# Patient Record
Sex: Female | Born: 1940 | Race: White | Hispanic: No | Smoking: Former smoker
Health system: Southern US, Community
[De-identification: ages and names within clinical notes are randomized; demographics above are authoritative.]

## PROBLEM LIST (undated history)

## (undated) DIAGNOSIS — E785 Hyperlipidemia, unspecified: Secondary | ICD-10-CM

## (undated) DIAGNOSIS — E039 Hypothyroidism, unspecified: Secondary | ICD-10-CM

## (undated) DIAGNOSIS — I10 Essential (primary) hypertension: Secondary | ICD-10-CM

## (undated) HISTORY — DX: Essential (primary) hypertension: I10

## (undated) HISTORY — DX: Hyperlipidemia, unspecified: E78.5

## (undated) HISTORY — PX: CARDIAC CATHETERIZATION: SHX172

## (undated) HISTORY — PX: TOTAL HIP ARTHROPLASTY: SHX124

## (undated) HISTORY — PX: ABDOMINAL HYSTERECTOMY: SHX81

## (undated) HISTORY — PX: TONSILLECTOMY: SUR1361

## (undated) HISTORY — PX: EYE SURGERY: SHX253

## (undated) HISTORY — DX: Hypothyroidism, unspecified: E03.9

## (undated) HISTORY — PX: PARTIAL HYSTERECTOMY: SHX80

---

## 2004-08-16 ENCOUNTER — Emergency Department (HOSPITAL_COMMUNITY): Admission: EM | Admit: 2004-08-16 | Discharge: 2004-08-16 | Payer: Self-pay | Admitting: Emergency Medicine

## 2007-02-10 ENCOUNTER — Ambulatory Visit: Payer: Self-pay | Admitting: Internal Medicine

## 2008-03-25 ENCOUNTER — Ambulatory Visit: Payer: Self-pay | Admitting: Internal Medicine

## 2009-03-28 ENCOUNTER — Ambulatory Visit: Payer: Self-pay | Admitting: Internal Medicine

## 2010-03-27 ENCOUNTER — Encounter: Payer: Self-pay | Admitting: Cardiovascular Disease

## 2010-05-09 ENCOUNTER — Encounter: Payer: Self-pay | Admitting: Cardiovascular Disease

## 2010-05-09 ENCOUNTER — Ambulatory Visit (INDEPENDENT_AMBULATORY_CARE_PROVIDER_SITE_OTHER): Payer: Self-pay | Admitting: Cardiovascular Disease

## 2010-05-09 VITALS — BP 134/74 | HR 80 | Ht 61.0 in | Wt 238.8 lb

## 2010-05-09 DIAGNOSIS — E669 Obesity, unspecified: Secondary | ICD-10-CM | POA: Insufficient documentation

## 2010-05-09 DIAGNOSIS — R0602 Shortness of breath: Secondary | ICD-10-CM | POA: Insufficient documentation

## 2010-05-09 DIAGNOSIS — E785 Hyperlipidemia, unspecified: Secondary | ICD-10-CM

## 2010-05-09 DIAGNOSIS — I10 Essential (primary) hypertension: Secondary | ICD-10-CM | POA: Insufficient documentation

## 2010-05-09 DIAGNOSIS — I2510 Atherosclerotic heart disease of native coronary artery without angina pectoris: Secondary | ICD-10-CM | POA: Insufficient documentation

## 2010-05-09 NOTE — Patient Instructions (Addendum)
You are doing well.  Please monitor your heart rate and call us if it is elevated.  No medication changes were made. Please call us if you have new issues that need to be addressed before your next appt.  Please increase your water aerobics. Watch your diet, we have given you [facility] Lipid Low Glycemic Diet to follow. We will call you for a follow up Appt.

## 2010-05-09 NOTE — Assessment & Plan Note (Signed)
Blood pressure is well controlled on today's visit. We did discuss low-dose metoprolol as she would like to try exercise first. If her heart rate is elevated, and we need low-dose metoprolol, we could decrease her losartan HCTZ in half.

## 2010-05-09 NOTE — Assessment & Plan Note (Signed)
We spent a significant portion of the visit talking about weight loss techniques including various dietary changes and increasing her exercise to more than twice a week.

## 2010-05-09 NOTE — Assessment & Plan Note (Signed)
Shortness of breath is likely secondary to underlying deconditioning and obesity. We have asked her to increase her exercise

## 2010-05-09 NOTE — Assessment & Plan Note (Addendum)
She reports a remote history of mild coronary disease by cardiac catheterization numerous years ago. She denies any significant symptoms of chest discomfort or anginal equivalent at this time. She is not particularly interested in any additional workup at this time as she does feel well. We have encouraged her to continue to lose weight as this will also help improve her cholesterol.  Her heart rate was in the 80s. She reports that it is typically elevated at baseline. I asked her to monitor her heart rate with mild exertion at home as she does have a blood pressure cuff. If she typically runs fast, she may benefit from a low-dose beta blocker in an effort to improve any underlying tachycardia and her shortness of breath.

## 2010-05-09 NOTE — Progress Notes (Signed)
   Patient ID: Meagan Cox, female    DOB: [birth year], 70 y.o.   MRN: [id number]  HPI Comments: 70 year old woman with obesity, hypertension, hyperlipidemia, remote history of coronary artery disease by catheterization many years ago though the report is not available to [place] at this time, hypothyroidism who presents referral from Dr. Darrick Huntsman for evaluation for her history of coronary disease.  She reports that she goes to water aerobics twice a week. She works out for at least one hour. Occasionally she has shortness of breath though typically she is able to finish the class. She does have shortness of breath with walking. She does not do any significant walking or other aerobic activities. She has been trying to lose weight and is considering a surgical procedure. She denies any problems with her blood pressure. She does have very rare episodes of chest discomfort that is in a small region on the left breast it comes on at rest. No significant diaphoresis, near syncope. Otherwise she has no complaints. She does not believe that she has worsening coronary disease at this time as she feels well.  EKG shows normal sinus rhythm with rate 80 beats per minute with left axis deviation, nonspecific ST changes in the anterolateral leads     Review of Systems  Constitutional: Positive for fatigue.  HENT: Negative.   Eyes: Negative.   Respiratory: Positive for shortness of breath.   Cardiovascular: Negative.   Gastrointestinal: Negative.   Musculoskeletal: Negative.   Skin: Negative.   Neurological: Negative.   Hematological: Negative.   Psychiatric/Behavioral: Negative.   All other systems reviewed and are negative.    BP 134/74  Pulse 80  Ht 5\' 1"  (1.549 m)  Wt 238 lb 12.8 oz (108.319 kg)  BMI 45.12 kg/m2   Physical Exam  Nursing note and vitals reviewed. Constitutional: She is oriented to person, place, and time. She appears well-developed and well-nourished.       obese  HENT:  Head:  Normocephalic.  Nose: Nose normal.  Mouth/Throat: Oropharynx is clear and moist.  Eyes: Conjunctivae are normal. Pupils are equal, round, and reactive to light.  Neck: Normal range of motion. Neck supple. No JVD present.  Cardiovascular: Normal rate, regular rhythm, normal heart sounds and intact distal pulses.  Exam reveals no gallop and no friction rub.   No murmur heard. Pulmonary/Chest: Effort normal and breath sounds normal. No respiratory distress. She has no wheezes. She has no rales. She exhibits no tenderness.  Abdominal: Soft. Bowel sounds are normal. She exhibits no distension. There is no tenderness.  Musculoskeletal: Normal range of motion. She exhibits no edema and no tenderness.  Lymphadenopathy:    She has no cervical adenopathy.  Neurological: She is alert and oriented to person, place, and time. Coordination normal.  Skin: Skin is warm and dry. No rash noted. No erythema.  Psychiatric: She has a normal mood and affect. Her behavior is normal. Judgment and thought content normal.         Assessment and Plan

## 2010-05-09 NOTE — Assessment & Plan Note (Signed)
Ideally, if she does have mild coronary artery disease, we would like an LDL less than 70. She does not want to change any of her medications at this time. We have recommended weight loss and increased exercise as a way to decrease her cholesterol.

## 2010-12-18 ENCOUNTER — Ambulatory Visit (INDEPENDENT_AMBULATORY_CARE_PROVIDER_SITE_OTHER): Payer: Medicare Other | Admitting: Internal Medicine

## 2010-12-18 ENCOUNTER — Encounter: Payer: Self-pay | Admitting: Internal Medicine

## 2010-12-18 ENCOUNTER — Ambulatory Visit (INDEPENDENT_AMBULATORY_CARE_PROVIDER_SITE_OTHER)
Admission: RE | Admit: 2010-12-18 | Discharge: 2010-12-18 | Disposition: A | Discharge status: Home / Self Care | Payer: Medicare Other | Source: Ambulatory Visit | Attending: Internal Medicine | Admitting: Internal Medicine

## 2010-12-18 VITALS — BP 136/80 | HR 74 | Temp 98.4°F | Resp 16 | Ht 64.5 in | Wt 237.8 lb

## 2010-12-18 DIAGNOSIS — F419 Anxiety disorder, unspecified: Secondary | ICD-10-CM

## 2010-12-18 DIAGNOSIS — E669 Obesity, unspecified: Secondary | ICD-10-CM

## 2010-12-18 DIAGNOSIS — M549 Dorsalgia, unspecified: Secondary | ICD-10-CM

## 2010-12-18 DIAGNOSIS — M25551 Pain in right hip: Secondary | ICD-10-CM

## 2010-12-18 DIAGNOSIS — M545 Low back pain: Secondary | ICD-10-CM

## 2010-12-18 DIAGNOSIS — G8929 Other chronic pain: Secondary | ICD-10-CM | POA: Insufficient documentation

## 2010-12-18 DIAGNOSIS — F411 Generalized anxiety disorder: Secondary | ICD-10-CM

## 2010-12-18 DIAGNOSIS — M25559 Pain in unspecified hip: Secondary | ICD-10-CM

## 2010-12-18 DIAGNOSIS — E039 Hypothyroidism, unspecified: Secondary | ICD-10-CM | POA: Insufficient documentation

## 2010-12-18 MED ORDER — ALPRAZOLAM 0.5 MG PO TABS: 0.5000 mg | ORAL_TABLET | Freq: Every evening | ORAL | Status: DC | PRN

## 2010-12-18 MED ORDER — OXYCODONE HCL 5 MG PO TABS: 5.0000 mg | ORAL_TABLET | Freq: Three times a day (TID) | ORAL | Status: DC | PRN

## 2010-12-18 MED ORDER — TRAMADOL HCL 50 MG PO TABS: 50.0000 mg | ORAL_TABLET | Freq: Four times a day (QID) | ORAL | Status: DC | PRN

## 2010-12-18 NOTE — Assessment & Plan Note (Signed)
contorlled with synthroid.  Repeat tsh due.

## 2010-12-18 NOTE — Assessment & Plan Note (Addendum)
Efforts to lose weight included trial of phentermine and celexa Renato Shin weight loss method) in March 2012 with 7 lb loss in one month .  However her interest waned  And her ability to exercise to sustain the weight loss has been hampered by persistent low back pain limiting her activity.

## 2010-12-18 NOTE — Progress Notes (Signed)
  Subjective:    Patient ID: Meagan Cox, female    DOB: May 31, 1940, 70 y.o.   MRN: [id number]  HPI  70 yo white female with chronic low back pain , treated for facet syndrome by chiropractor Dr. Alfredo Bach, for persistent low back pain .  She is having persistent pain n the L5-S1 pain radiates to adductor and anterior thigh bilaterally with progressive weakness of the right hip flexors.  3 months duration. Has been taking over 4 gm of tylenol daily for the last month .    Review of Systems  Constitutional: Negative for fever, chills and unexpected weight change.  HENT: Negative for hearing loss, ear pain, nosebleeds, congestion, sore throat, facial swelling, rhinorrhea, sneezing, mouth sores, trouble swallowing, neck pain, neck stiffness, voice change, postnasal drip, sinus pressure, tinnitus and ear discharge.   Eyes: Negative for pain, discharge, redness and visual disturbance.  Respiratory: Negative for cough, chest tightness, shortness of breath, wheezing and stridor.   Cardiovascular: Negative for chest pain, palpitations and leg swelling.  Musculoskeletal: Positive for back pain. Negative for myalgias and arthralgias.  Skin: Negative for color change and rash.  Neurological: Negative for dizziness, weakness, light-headedness and headaches.  Hematological: Negative for adenopathy.       Objective:   Physical Exam  Constitutional: She is oriented to person, place, and time. She appears well-developed and well-nourished.  HENT:  Mouth/Throat: Oropharynx is clear and moist.  Eyes: EOM are normal. Pupils are equal, round, and reactive to light. No scleral icterus.  Neck: Normal range of motion. Neck supple. No JVD present. No thyromegaly present.  Cardiovascular: Normal rate, regular rhythm, normal heart sounds and intact distal pulses.   Pulmonary/Chest: Effort normal and breath sounds normal.  Abdominal: Soft. Bowel sounds are normal. She exhibits no mass. There is no tenderness.    Musculoskeletal: She exhibits no edema.       Right hip: She exhibits decreased range of motion.  Lymphadenopathy:    She has no cervical adenopathy.  Neurological: She is alert and oriented to person, place, and time.  Skin: Skin is warm and dry.  Psychiatric: She has a normal mood and affect.          Assessment & Plan:

## 2010-12-18 NOTE — Assessment & Plan Note (Signed)
With persistent bilateral thigh pain,  Plain films ordered,  wi meed to rule out spinal stenosis

## 2010-12-18 NOTE — Assessment & Plan Note (Signed)
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 Plain films ordered.

## 2010-12-18 NOTE — Patient Instructions (Signed)
We are ordering x rays of your right hip and lumbar spine to start with  Please stop the tylenol and use the tramadol and roxicodone for pain control.  The roxicodone is a narcotic and my cause constipation or sleepiness.

## 2010-12-19 LAB — COMPREHENSIVE METABOLIC PANEL
AST: 19 U/L (ref 0–37)
Albumin: 3.6 g/dL (ref 3.5–5.2)
Alkaline Phosphatase: 66 U/L (ref 39–117)
Calcium: 9 mg/dL (ref 8.4–10.5)
Chloride: 104 mEq/L (ref 96–112)
Glucose, Bld: 110 mg/dL — ABNORMAL HIGH (ref 70–99)
Potassium: 3.9 mEq/L (ref 3.5–5.1)
Sodium: 141 mEq/L (ref 135–145)
Total Protein: 6.8 g/dL (ref 6.0–8.3)

## 2010-12-22 ENCOUNTER — Encounter: Payer: Self-pay | Admitting: Internal Medicine

## 2011-02-09 ENCOUNTER — Other Ambulatory Visit: Payer: Self-pay | Admitting: Internal Medicine

## 2011-02-09 ENCOUNTER — Ambulatory Visit (INDEPENDENT_AMBULATORY_CARE_PROVIDER_SITE_OTHER): Payer: Medicare Other | Admitting: *Deleted

## 2011-02-09 DIAGNOSIS — Z23 Encounter for immunization: Secondary | ICD-10-CM

## 2011-02-09 MED ORDER — ZOSTER VACCINE LIVE 19400 UNT/0.65ML ~~LOC~~ SOLR: 0.6500 mL | Freq: Once | SUBCUTANEOUS | Status: AC

## 2011-04-06 ENCOUNTER — Ambulatory Visit (INDEPENDENT_AMBULATORY_CARE_PROVIDER_SITE_OTHER): Payer: Medicare Other | Admitting: Internal Medicine

## 2011-04-06 ENCOUNTER — Encounter: Payer: Self-pay | Admitting: Internal Medicine

## 2011-04-06 VITALS — BP 132/88 | HR 80 | Temp 97.6°F | Resp 16 | Ht 62.0 in | Wt 245.0 lb

## 2011-04-06 DIAGNOSIS — N63 Unspecified lump in unspecified breast: Secondary | ICD-10-CM

## 2011-04-06 DIAGNOSIS — N6310 Unspecified lump in the right breast, unspecified quadrant: Secondary | ICD-10-CM

## 2011-04-06 DIAGNOSIS — M130 Polyarthritis, unspecified: Secondary | ICD-10-CM

## 2011-04-06 DIAGNOSIS — E785 Hyperlipidemia, unspecified: Secondary | ICD-10-CM

## 2011-04-06 DIAGNOSIS — Z79899 Other long term (current) drug therapy: Secondary | ICD-10-CM

## 2011-04-06 MED ORDER — MELOXICAM 15 MG PO TABS: 15.0000 mg | ORAL_TABLET | Freq: Every day | ORAL | Status: DC

## 2011-04-06 MED ORDER — LOVASTATIN 40 MG PO TABS: 40.0000 mg | ORAL_TABLET | Freq: Every day | ORAL | Status: DC

## 2011-04-06 NOTE — Progress Notes (Signed)
Subjective:    Patient ID: Meagan Cox, female    DOB: 1941-01-29, 71 y.o.   MRN: [id number]  HPI  71 yr old white female with history of obesity, hypertension presents with right sided breast mass which she found two weeks ago while on a cruise.  She did suffer from a URI which she contracted from the passenger on the plane next to her who was sick. The mass is slightly tender.  Her sinus symptoms have resolved.  Her second issue is her persistent ankle athropathy, which has been under evaluation by her orthopedist who is suggesting that she have a workup for inflammatory arthritis due to changes seen on recent films.   Past Medical History  Diagnosis Date  . Hypertension   . Hyperlipidemia   . Hypothyroidism    Current Outpatient Prescriptions on File Prior to Visit  Medication Sig Dispense Refill  . ALPRAZolam (XANAX) 0.5 MG tablet Take 1 tablet (0.5 mg total) by mouth at bedtime as needed.  30 tablet  3  . aspirin 81 MG EC tablet Take 81 mg by mouth daily.        . calcium carbonate (CALCIUM 500) 1250 MG tablet Take 1 tablet by mouth daily.        Marland Kitchen estradiol (ESTRACE) 2 MG tablet Take 2 mg by mouth daily.        Marland Kitchen levothyroxine (SYNTHROID, LEVOTHROID) 125 MCG tablet Take 125 mcg by mouth daily.        Marland Kitchen losartan-hydrochlorothiazide (HYZAAR) 100-25 MG per tablet Take 1 tablet by mouth daily.        . Multiple Vitamin (MULTIVITAMIN) tablet Take 1 tablet by mouth daily.          Review of Systems  Constitutional: Negative for fever, chills and unexpected weight change.  HENT: Negative for hearing loss, ear pain, nosebleeds, congestion, sore throat, facial swelling, rhinorrhea, sneezing, mouth sores, trouble swallowing, neck pain, neck stiffness, voice change, postnasal drip, sinus pressure, tinnitus and ear discharge.   Eyes: Negative for pain, discharge, redness and visual disturbance.  Respiratory: Negative for cough, chest tightness, shortness of breath, wheezing and stridor.     Cardiovascular: Negative for chest pain, palpitations and leg swelling.  Musculoskeletal: Negative for myalgias and arthralgias.  Skin: Negative for color change and rash.  Neurological: Negative for dizziness, weakness, light-headedness and headaches.  Hematological: Negative for adenopathy.       Objective:   Physical Exam  Constitutional: She is oriented to person, place, and time. She appears well-developed and well-nourished.  HENT:  Mouth/Throat: Oropharynx is clear and moist.  Eyes: EOM are normal. Pupils are equal, round, and reactive to light. No scleral icterus.  Neck: Normal range of motion. Neck supple. No JVD present. No thyromegaly present.  Cardiovascular: Normal rate, regular rhythm, normal heart sounds and intact distal pulses.   Pulmonary/Chest: Effort normal and breath sounds normal. Right breast exhibits mass and tenderness.    Abdominal: Soft. Bowel sounds are normal. She exhibits no mass. There is no tenderness.  Musculoskeletal: Normal range of motion. She exhibits no edema.  Lymphadenopathy:    She has no cervical adenopathy.  Neurological: She is alert and oriented to person, place, and time.  Skin: Skin is warm and dry.  Psychiatric: She has a normal mood and affect.     Assessment & Plan:  Breast mass: diagnostic mammogram and ultrasound ordered. Hyperlipidemia:  On statin therapy.  Due for repeat labs.   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    Updated Medication List Outpatient Encounter Prescriptions as of 04/06/2011  Medication Sig Dispense Refill  . ALPRAZolam (XANAX) 0.5 MG tablet Take 1 tablet (0.5 mg total) by mouth at bedtime as needed.  30 tablet  3  . aspirin 81 MG EC tablet Take 81 mg by mouth daily.        . calcium carbonate (CALCIUM 500) 1250 MG tablet Take 1 tablet by mouth daily.        Marland Kitchen estradiol (ESTRACE) 2 MG tablet Take 2 mg by mouth daily.        Marland Kitchen levothyroxine (SYNTHROID, LEVOTHROID) 125  MCG tablet Take 125 mcg by mouth daily.        Marland Kitchen losartan-hydrochlorothiazide (HYZAAR) 100-25 MG per tablet Take 1 tablet by mouth daily.        Marland Kitchen lovastatin (MEVACOR) 40 MG tablet Take 1 tablet (40 mg total) by mouth at bedtime.  90 tablet  3  . Multiple Vitamin (MULTIVITAMIN) tablet Take 1 tablet by mouth daily.        Marland Kitchen DISCONTD: lovastatin (MEVACOR) 40 MG tablet Take 40 mg by mouth at bedtime.        . meloxicam (MOBIC) 15 MG tablet Take 1 tablet (15 mg total) by mouth daily.  30 tablet  3  . DISCONTD: oxyCODONE (ROXICODONE) 5 MG immediate release tablet Take 1 tablet (5 mg total) by mouth every 8 (eight) hours as needed for pain.  60 tablet  0  . DISCONTD: traMADol (ULTRAM) 50 MG tablet Take 1 tablet (50 mg total) by mouth every 6 (six) hours as needed for pain. Maximum dose= 8 tablets per day  120 tablet  4

## 2011-04-08 DIAGNOSIS — M130 Polyarthritis, unspecified: Secondary | ICD-10-CM | POA: Insufficient documentation

## 2011-04-08 NOTE — Assessment & Plan Note (Signed)
checking serologies for inflammatory arthritis.

## 2011-04-13 ENCOUNTER — Telehealth: Payer: Self-pay | Admitting: Internal Medicine

## 2011-04-13 NOTE — Telephone Encounter (Signed)
The breast imagines were normal.  No masses.

## 2011-04-13 NOTE — Telephone Encounter (Signed)
Left message asking patient to return my call.

## 2011-04-18 NOTE — Telephone Encounter (Signed)
Patient notified of results.

## 2011-04-20 ENCOUNTER — Encounter: Payer: Self-pay | Admitting: Internal Medicine

## 2011-05-08 ENCOUNTER — Ambulatory Visit (INDEPENDENT_AMBULATORY_CARE_PROVIDER_SITE_OTHER): Payer: Medicare Other | Admitting: Internal Medicine

## 2011-05-08 ENCOUNTER — Encounter: Payer: Self-pay | Admitting: Internal Medicine

## 2011-05-08 VITALS — BP 116/78 | HR 68 | Temp 97.9°F | Resp 16 | Wt 248.2 lb

## 2011-05-08 DIAGNOSIS — M130 Polyarthritis, unspecified: Secondary | ICD-10-CM

## 2011-05-08 DIAGNOSIS — Z79899 Other long term (current) drug therapy: Secondary | ICD-10-CM

## 2011-05-08 DIAGNOSIS — I10 Essential (primary) hypertension: Secondary | ICD-10-CM

## 2011-05-08 DIAGNOSIS — E669 Obesity, unspecified: Secondary | ICD-10-CM

## 2011-05-08 DIAGNOSIS — E785 Hyperlipidemia, unspecified: Secondary | ICD-10-CM

## 2011-05-08 NOTE — Assessment & Plan Note (Addendum)
I spent 25 minutes addressing BMI and recommended a low glycemic index diet utilizing smaller more frequent meals to increase metabolism.  I have also recommended that patient start exercising with a goal of 30 minutes of aerobic exercise a minimum of 5 days per week.

## 2011-05-08 NOTE — Patient Instructions (Signed)
Consider the Low Glycemic Index Diet and 6 smaller meals daily :   7 AM Low carbohydrate Protein  Shakes (EAS Carb Control  Or Atkins ,  Available everywhere,   In  cases at [employer] )  2.5 carbs  (Add or substitute a toasted sandwhich thin w/ peanut butter)  10 AM: Protein bar by Atkins (snack size,  Chocolate lover's variety at  [employer])    Lunch: sandwich on pita bread or flatbread (Joseph's makes a pita bread and a flat bread , available at [employer] and [employer]; Toufayah makes a low carb flatbread available at [employer] and [employer])   [employer] make low carb tortillas   3 PM:  Mid day :  Another protein bar,  Or a  cheese stick, 1/4 cup of almonds, walnuts, pistachios, pecans, peanuts,  Macadamia nuts  6 PM  Dinner:  "mean and green:"  Meat/chicken/fish, salad, and green veggie : use ranch, vinagrette,  Blue cheese, etc  9 PM snack : Breyer's low carb fudgiscle or  ice cream bar (Carb Smart) Weight Watcher's ice cream bar , or another protein shake

## 2011-05-08 NOTE — Assessment & Plan Note (Signed)
Well controlled on current medications.  No changes today. 

## 2011-05-08 NOTE — Progress Notes (Signed)
Patient ID: Meagan Cox, female   DOB: [birth year], 71 y.o.   MRN: [id number]  Patient Active Problem List  Diagnoses  . Obesity  . SOB (shortness of breath)  . CAD (coronary artery disease)  . Hyperlipidemia  . HTN (hypertension)  . Hypothyroidism  . Low back pain radiating to both legs  . Hip pain, right  . Polyarthritis of ankle    Subjective:  CC:   Chief Complaint  Patient presents with  . Follow-up    HPI:   Meagan Tinkle Cooperis a 71 y.o. female who presents for follow up on obesity .  She has continued to gain weight because she she cannot adhere to a low carbohydrate or low calorie diet.  She exercising twice weekly in a water aerobics class.  She often skips meals because she forgets to eat.   Past Medical History  Diagnosis Date  . Hypertension   . Hyperlipidemia   . Hypothyroidism     Past Surgical History  Procedure Date  . Cardiac catheterization     30 years ago, [place]  . Tonsillectomy   . Partial hysterectomy   . Abdominal hysterectomy          The following portions of the patient's history were reviewed and updated as appropriate: Allergies, current medications, and problem list.    Review of Systems:   12 Pt  review of systems was negative except those addressed in the HPI,     History   Social History  . Marital Status: Divorced    Spouse Name: N/A    Number of Children: N/A  . Years of Education: N/A   Occupational History  . Not on file.   Social History Main Topics  . Smoking status: Former Smoker -- 20 years    Types: Cigarettes    Quit date: 01/30/1988  . Smokeless tobacco: Never Used  . Alcohol Use: Yes     social/occasional  . Drug Use: No  . Sexually Active: Not on file   Other Topics Concern  . Not on file   Social History Narrative  . No narrative on file    Objective:  BP 116/78  Pulse 68  Temp(Src) 97.9 F (36.6 C) (Oral)  Resp 16  Wt 248 lb 4 oz (112.605 kg)  SpO2 97%  General  appearance: alert, cooperative and appears stated age Ears: normal TM's and external ear canals both ears Throat: lips, mucosa, and tongue normal; teeth and gums normal Neck: no adenopathy, no carotid bruit, supple, symmetrical, trachea midline and thyroid not enlarged, symmetric, no tenderness/mass/nodules Back: symmetric, no curvature. ROM normal. No CVA tenderness. Lungs: clear to auscultation bilaterally Heart: regular rate and rhythm, S1, S2 normal, no murmur, click, rub or gallop Abdomen: soft, non-tender; bowel sounds normal; no masses,  no organomegaly Pulses: 2+ and symmetric Skin: Skin color, texture, turgor normal. No rashes or lesions Lymph nodes: Cervical, supraclavicular, and axillary nodes normal.  Assessment and Plan:  Obesity I spent 25 minutes addressing BMI and recommended a low glycemic index diet utilizing smaller more frequent meals to increase metabolism.  I have also recommended that patient start exercising with a goal of 30 minutes of aerobic exercise a minimum of 5 days per week.   HTN (hypertension) Well controlled on current medications.  No changes today.    Updated Medication List Outpatient Encounter Prescriptions as of 05/08/2011  Medication Sig Dispense Refill  . ALPRAZolam (XANAX) 0.5 MG tablet Take 1 tablet (0.5 mg total)  by mouth at bedtime as needed.  30 tablet  3  . aspirin 81 MG EC tablet Take 81 mg by mouth daily.        . calcium carbonate (CALCIUM 500) 1250 MG tablet Take 1 tablet by mouth daily.        Marland Kitchen estradiol (ESTRACE) 2 MG tablet Take 2 mg by mouth daily.        Marland Kitchen levothyroxine (SYNTHROID, LEVOTHROID) 125 MCG tablet Take 125 mcg by mouth daily.        Marland Kitchen losartan-hydrochlorothiazide (HYZAAR) 100-25 MG per tablet Take 1 tablet by mouth daily.        Marland Kitchen lovastatin (MEVACOR) 40 MG tablet Take 1 tablet (40 mg total) by mouth at bedtime.  90 tablet  3  . meloxicam (MOBIC) 15 MG tablet Take 1 tablet (15 mg total) by mouth daily.  30 tablet  3    . Multiple Vitamin (MULTIVITAMIN) tablet Take 1 tablet by mouth daily.           Orders Placed This Encounter  Procedures  . Sedimentation rate  . C-reactive protein  . Uric acid  . ANA  . Rheumatoid factor  . Lipid panel  . COMPLETE METABOLIC PANEL WITH GFR    No Follow-up on file.

## 2011-05-09 LAB — C-REACTIVE PROTEIN: CRP: 0.71 mg/dL — ABNORMAL HIGH (ref <0.60)

## 2011-05-09 LAB — LIPID PANEL
Total CHOL/HDL Ratio: 2.5 Ratio
VLDL: 42 mg/dL — ABNORMAL HIGH (ref 0–40)

## 2011-05-09 LAB — COMPLETE METABOLIC PANEL WITH GFR
AST: 18 U/L (ref 0–37)
Albumin: 3.8 g/dL (ref 3.5–5.2)
Alkaline Phosphatase: 63 U/L (ref 39–117)
Potassium: 4.4 mEq/L (ref 3.5–5.3)
Sodium: 138 mEq/L (ref 135–145)
Total Protein: 6.2 g/dL (ref 6.0–8.3)

## 2011-05-09 LAB — SEDIMENTATION RATE: Sed Rate: 4 mm/hr (ref 0–22)

## 2011-05-09 LAB — URIC ACID: Uric Acid, Serum: 5 mg/dL (ref 2.4–7.0)

## 2011-05-09 LAB — RHEUMATOID FACTOR: Rhuematoid fact SerPl-aCnc: <10 IU/mL (ref <=14)

## 2011-05-10 ENCOUNTER — Other Ambulatory Visit: Payer: Self-pay | Admitting: Internal Medicine

## 2011-05-10 MED ORDER — LOSARTAN POTASSIUM-HCTZ 100-25 MG PO TABS: 1.0000 | ORAL_TABLET | Freq: Every day | ORAL | Status: DC

## 2011-07-09 ENCOUNTER — Other Ambulatory Visit: Payer: Self-pay | Admitting: Internal Medicine

## 2011-08-21 ENCOUNTER — Other Ambulatory Visit: Payer: Self-pay | Admitting: Internal Medicine

## 2011-12-25 ENCOUNTER — Other Ambulatory Visit: Payer: Self-pay

## 2011-12-25 MED ORDER — LEVOTHYROXINE SODIUM 125 MCG PO TABS: 125.0000 ug | ORAL_TABLET | Freq: Every day | ORAL | Status: DC

## 2011-12-25 NOTE — Telephone Encounter (Signed)
Refill request for Levothyroxine 125 mcg sent electronic to [facility].

## 2012-01-02 ENCOUNTER — Other Ambulatory Visit: Payer: Self-pay

## 2012-01-02 MED ORDER — LEVOTHYROXINE SODIUM 125 MCG PO TABS: 125.0000 ug | ORAL_TABLET | Freq: Every day | ORAL | Status: DC

## 2012-01-02 NOTE — Telephone Encounter (Signed)
Patient requested 90 day supply of Levothyroxine change was made in epic.

## 2012-01-04 ENCOUNTER — Encounter: Payer: Self-pay | Admitting: Internal Medicine

## 2012-01-04 ENCOUNTER — Ambulatory Visit (INDEPENDENT_AMBULATORY_CARE_PROVIDER_SITE_OTHER): Payer: Medicare Other | Admitting: Internal Medicine

## 2012-01-04 VITALS — BP 130/78 | HR 66 | Temp 98.3°F | Resp 12 | Ht 62.0 in | Wt 231.5 lb

## 2012-01-04 DIAGNOSIS — E039 Hypothyroidism, unspecified: Secondary | ICD-10-CM

## 2012-01-04 DIAGNOSIS — J069 Acute upper respiratory infection, unspecified: Secondary | ICD-10-CM

## 2012-01-04 DIAGNOSIS — I10 Essential (primary) hypertension: Secondary | ICD-10-CM

## 2012-01-04 DIAGNOSIS — Z79899 Other long term (current) drug therapy: Secondary | ICD-10-CM

## 2012-01-04 DIAGNOSIS — F419 Anxiety disorder, unspecified: Secondary | ICD-10-CM

## 2012-01-04 DIAGNOSIS — Z1331 Encounter for screening for depression: Secondary | ICD-10-CM

## 2012-01-04 DIAGNOSIS — E669 Obesity, unspecified: Secondary | ICD-10-CM

## 2012-01-04 DIAGNOSIS — F411 Generalized anxiety disorder: Secondary | ICD-10-CM

## 2012-01-04 DIAGNOSIS — E785 Hyperlipidemia, unspecified: Secondary | ICD-10-CM

## 2012-01-04 DIAGNOSIS — F418 Other specified anxiety disorders: Secondary | ICD-10-CM

## 2012-01-04 DIAGNOSIS — F341 Dysthymic disorder: Secondary | ICD-10-CM

## 2012-01-04 LAB — COMPREHENSIVE METABOLIC PANEL
AST: 18 U/L (ref 0–37)
Alkaline Phosphatase: 62 U/L (ref 39–117)
BUN: 21 mg/dL (ref 6–23)
Calcium: 9.1 mg/dL (ref 8.4–10.5)
Creatinine, Ser: 0.7 mg/dL (ref 0.4–1.2)

## 2012-01-04 LAB — TSH: TSH: 1.44 u[IU]/mL (ref 0.35–5.50)

## 2012-01-04 MED ORDER — BENZONATATE 200 MG PO CAPS: 200.0000 mg | ORAL_CAPSULE | Freq: Two times a day (BID) | ORAL | Status: DC | PRN

## 2012-01-04 MED ORDER — GUAIFENESIN-CODEINE 100-10 MG/5ML PO SYRP: 5.0000 mL | ORAL_SOLUTION | Freq: Three times a day (TID) | ORAL | Status: DC | PRN

## 2012-01-04 MED ORDER — AZITHROMYCIN 500 MG PO TABS: 500.0000 mg | ORAL_TABLET | Freq: Every day | ORAL | Status: DC

## 2012-01-04 MED ORDER — ALPRAZOLAM 0.5 MG PO TABS: 0.5000 mg | ORAL_TABLET | Freq: Every evening | ORAL | Status: DC | PRN

## 2012-01-04 NOTE — Progress Notes (Signed)
Patient ID: Meagan Cox, female   DOB: Apr 09, 1940, 71 y.o.   MRN: [id number]  Patient Active Problem List  Diagnosis  . Obesity  . SOB (shortness of breath)  . CAD (coronary artery disease)  . Hyperlipidemia  . HTN (hypertension)  . Hypothyroidism  . Low back pain radiating to both legs  . Hip pain, right  . Polyarthritis of ankle  . Generalized anxiety disorder  . URI (upper respiratory infection)    Subjective:  CC:   Chief Complaint  Patient presents with  . Follow-up    HPI:   Meagan Cox is a 71 y.o. female who presents for follow up on chronic conditions including hypertension and obesity.  She has lost over 17 lbs since her last visit but denies any concerted effort to do so.  She recently returned from [place],  [place] cruise, to [place] with 4 port of calls .  Getting ready to go to [place] to spend the holidays with her son in law, followed by a trip to [place] for her birthday and a wedding.  Her cc is persistent cough,  headache and sinus congestion.  She has had a lot of clear drainage.  2) Depression with anxiety.  She is managing her symptoms with prn alprazolam but ran out.       Past Medical History  Diagnosis Date  . Hypertension   . Hyperlipidemia   . Hypothyroidism     Past Surgical History  Procedure Date  . Cardiac catheterization     30 years ago,   . Tonsillectomy   . Partial hysterectomy   . Abdominal hysterectomy          The following portions of the patient's history were reviewed and updated as appropriate: Allergies, current medications, and problem list.    Review of Systems:   Patient denies , fevers, skin rash, eye pain,  sore throat, dysphagia,  hemoptysis ,chest pain, palpitations, orthopnea, edema, abdominal pain, nausea, melena, diarrhea, constipation, flank pain, dysuria, hematuria, urinary  Frequency, nocturia, numbness, tingling, seizures,  Focal weakness, Loss of consciousness,  Tremor,  insomnia, depression, and suicidal ideation.       History   Social History  . Marital Status: Divorced    Spouse Name: N/A    Number of Children: N/A  . Years of Education: N/A   Occupational History  . Not on file.   Social History Main Topics  . Smoking status: Former Smoker -- 20 years    Types: Cigarettes    Quit date: 01/30/1988  . Smokeless tobacco: Never Used  . Alcohol Use: Yes     Comment: social/occasional  . Drug Use: No  . Sexually Active: Not on file   Other Topics Concern  . Not on file   Social History Narrative  . No narrative on file    Objective:  BP 130/78  Pulse 66  Temp 98.3 F (36.8 C) (Oral)  Resp 12  Ht 5\' 2"  (1.575 m)  Wt 231 lb 8 oz (105.008 kg)  BMI 42.34 kg/m2  SpO2 95%  General appearance: alert, cooperative and appears stated age Ears: normal TM's and external ear canals both ears Nose: bilateral maxillary facial pain.  Throat: lips, mucosa, and tongue normal; teeth and gums normal Neck: no adenopathy, no carotid bruit, supple, symmetrical, trachea midline and thyroid not enlarged, symmetric, no tenderness/mass/nodules Back: symmetric, no curvature. ROM normal. No CVA tenderness. Lungs: clear to auscultation bilaterally Heart: regular rate and rhythm, S1,  S2 normal, no murmur, click, rub or gallop Abdomen: soft, non-tender; bowel sounds normal; no masses,  no organomegaly Pulses: 2+ and symmetric Skin: Skin color, texture, turgor normal. No rashes or lesions Lymph nodes: Cervical, supraclavicular, and axillary nodes normal.  Assessment and Plan:  Generalized anxiety disorder She has no symptoms of depression by screen today, but uses alprazolam prn insomnia and anxiety and refers to it as her "happy pill.". Refill given.   HTN (hypertension) Well controlled on current regimen. Renal function stable, no changes today.  Hyperlipidemia Well controlled on current regimen. Normal LFTs as of today,  Her last  LDL was 62 in  April   Obesity improved BMI with weight loss of 17 lbs.  Continue current regimen, BMI is still 42.  Needs to lose 50 lbs over the next year.   URI (upper respiratory infection) Given chronicity of symptoms, development of facial pain and exam consistent with bacterial URI,  Will treat with empiric antibiotics, decongestants, and saline lavage.     Updated Medication List Outpatient Encounter Prescriptions as of 01/04/2012  Medication Sig Dispense Refill  . ALPRAZolam (XANAX) 0.5 MG tablet Take 1 tablet (0.5 mg total) by mouth at bedtime as needed.  30 tablet  3  . aspirin 81 MG EC tablet Take 81 mg by mouth daily.        . calcium carbonate (CALCIUM 500) 1250 MG tablet Take 1 tablet by mouth daily.        Marland Kitchen estradiol (ESTRACE) 2 MG tablet TAKE ONE TABLET BY MOUTH EVERY DAY  90 tablet  3  . levothyroxine (SYNTHROID, LEVOTHROID) 125 MCG tablet Take 1 tablet (125 mcg total) by mouth daily.  90 tablet  3  . losartan-hydrochlorothiazide (HYZAAR) 100-25 MG per tablet Take 1 tablet by mouth daily.  90 tablet  3  . lovastatin (MEVACOR) 40 MG tablet Take 1 tablet (40 mg total) by mouth at bedtime.  90 tablet  3  . meloxicam (MOBIC) 15 MG tablet TAKE ONE TABLET BY MOUTH EVERY DAY  30 tablet  5  . Multiple Vitamin (MULTIVITAMIN) tablet Take 1 tablet by mouth daily.        . [DISCONTINUED] ALPRAZolam (XANAX) 0.5 MG tablet Take 1 tablet (0.5 mg total) by mouth at bedtime as needed.  30 tablet  3  . azithromycin (ZITHROMAX) 500 MG tablet Take 1 tablet (500 mg total) by mouth daily.  7 tablet  0  . benzonatate (TESSALON) 200 MG capsule Take 1 capsule (200 mg total) by mouth 2 (two) times daily as needed for cough.  60 capsule  0  . guaiFENesin-codeine (CHERATUSSIN AC) 100-10 MG/5ML syrup Take 5 mLs by mouth 3 (three) times daily as needed for cough.  240 mL  0  . [DISCONTINUED] guaiFENesin-codeine (CHERATUSSIN AC) 100-10 MG/5ML syrup Take 5 mLs by mouth 3 (three) times daily as needed for cough.  180 mL   0     Orders Placed This Encounter  Procedures  . Comprehensive metabolic panel  . TSH    No Follow-up on file.

## 2012-01-04 NOTE — Patient Instructions (Addendum)
I am prescribing you azithromycin  7 day course   Use the old robitussin or Delsym OTC for daytime cough  You should use benadryl for the post nasal drip and  sudafed PE/afrin for nasal congestion or Afrin nasal spray twice daily for 5 days for the ear and sinus congestion  I also recommending using Simply Saline nasal spray twice daily to flush your sinuses out. (available OTC)

## 2012-01-06 ENCOUNTER — Encounter: Payer: Self-pay | Admitting: Internal Medicine

## 2012-01-06 DIAGNOSIS — F411 Generalized anxiety disorder: Secondary | ICD-10-CM | POA: Insufficient documentation

## 2012-01-06 DIAGNOSIS — J069 Acute upper respiratory infection, unspecified: Secondary | ICD-10-CM | POA: Insufficient documentation

## 2012-01-06 NOTE — Assessment & Plan Note (Signed)
Given chronicity of symptoms, development of facial pain and exam consistent with bacterial URI,  Will treat with empiric antibiotics, decongestants, and saline lavage.   

## 2012-01-06 NOTE — Assessment & Plan Note (Addendum)
She has no symptoms of depression by screen today, but uses alprazolam prn insomnia and anxiety and refers to it as her "happy pill.". Refill given.

## 2012-01-06 NOTE — Assessment & Plan Note (Signed)
Well controlled on current regimen. Renal function stable, no changes today. 

## 2012-01-06 NOTE — Assessment & Plan Note (Signed)
improved BMI with weight loss of 17 lbs.  Continue current regimen, BMI is still 42.  Needs to lose 50 lbs over the next year.

## 2012-01-06 NOTE — Assessment & Plan Note (Addendum)
Well controlled on current regimen. Normal LFTs as of today,  Her last  LDL was 62 in April

## 2012-03-20 ENCOUNTER — Other Ambulatory Visit: Payer: Self-pay | Admitting: Internal Medicine

## 2012-04-23 ENCOUNTER — Other Ambulatory Visit: Payer: Self-pay | Admitting: *Deleted

## 2012-04-23 MED ORDER — LEVOTHYROXINE SODIUM 125 MCG PO TABS: 125.0000 ug | ORAL_TABLET | Freq: Every day | ORAL | Status: DC

## 2012-04-23 NOTE — Telephone Encounter (Signed)
Med filled.  

## 2012-05-13 ENCOUNTER — Ambulatory Visit (INDEPENDENT_AMBULATORY_CARE_PROVIDER_SITE_OTHER): Payer: Medicare Other | Admitting: Internal Medicine

## 2012-05-13 ENCOUNTER — Encounter: Payer: Self-pay | Admitting: Internal Medicine

## 2012-05-13 VITALS — BP 142/86 | HR 71 | Temp 98.1°F | Resp 16 | Wt 238.2 lb

## 2012-05-13 DIAGNOSIS — R609 Edema, unspecified: Secondary | ICD-10-CM

## 2012-05-13 DIAGNOSIS — M25559 Pain in unspecified hip: Secondary | ICD-10-CM

## 2012-05-13 DIAGNOSIS — G8929 Other chronic pain: Secondary | ICD-10-CM

## 2012-05-13 DIAGNOSIS — M129 Arthropathy, unspecified: Secondary | ICD-10-CM

## 2012-05-13 DIAGNOSIS — M1990 Unspecified osteoarthritis, unspecified site: Secondary | ICD-10-CM

## 2012-05-13 DIAGNOSIS — E538 Deficiency of other specified B group vitamins: Secondary | ICD-10-CM

## 2012-05-13 DIAGNOSIS — M25551 Pain in right hip: Secondary | ICD-10-CM

## 2012-05-13 DIAGNOSIS — Z79899 Other long term (current) drug therapy: Secondary | ICD-10-CM

## 2012-05-13 DIAGNOSIS — E669 Obesity, unspecified: Secondary | ICD-10-CM

## 2012-05-13 DIAGNOSIS — E785 Hyperlipidemia, unspecified: Secondary | ICD-10-CM

## 2012-05-13 DIAGNOSIS — M130 Polyarthritis, unspecified: Secondary | ICD-10-CM

## 2012-05-13 DIAGNOSIS — I10 Essential (primary) hypertension: Secondary | ICD-10-CM

## 2012-05-13 LAB — BASIC METABOLIC PANEL
Chloride: 103 mEq/L (ref 96–112)
Creatinine, Ser: 0.6 mg/dL (ref 0.4–1.2)
Sodium: 137 mEq/L (ref 135–145)

## 2012-05-13 LAB — VITAMIN B12: Vitamin B-12: 323 pg/mL (ref 211–911)

## 2012-05-13 LAB — URIC ACID: Uric Acid, Serum: 4.7 mg/dL (ref 2.4–7.0)

## 2012-05-13 MED ORDER — FUROSEMIDE 20 MG PO TABS: 20.0000 mg | ORAL_TABLET | Freq: Every day | ORAL | Status: DC | PRN

## 2012-05-13 NOTE — Patient Instructions (Addendum)
Referral to Meagan Cox for evaluation of right hip   Furosemide.  As needed for swelling  Checking B12 and uric acid levels today for new onset "burning" symptoms      This is  One version of a  "Low GI"  Diet:  It will still lower your blood sugars and allow you to lose 4 to 8  lbs  per month if you follow it carefully.  Your goal with exercise is a minimum of 30 minutes of aerobic exercise 5 days per week (Walking does not count once it becomes easy!)    All of the foods can be found at grocery stores and in bulk at [employer].  The Atkins protein bars and shakes are available in more varieties at Target, WalMart and [employer].     7 AM Breakfast:  Choose from the following:  Low carbohydrate Protein  Shakes (I recommend the EAS AdvantEdge "Carb Control" shakes  Or the low carb shakes by Atkins.    2.5 carbs   Arnold's "Sandwhich Thin"toasted  w/ peanut butter (no jelly: about 20 net carbs  "Bagel Thin" with cream cheese and salmon: about 20 carbs   a scrambled egg/bacon/cheese burrito made with Mission's "carb balance" whole wheat tortilla  (about 10 net carbs )   Avoid cereal and bananas, oatmeal and cream of wheat and grits. They are loaded with carbohydrates!   10 AM: high protein snack  Protein bar by Atkins (the snack size, under 200 cal, usually < 6 net carbs).    A stick of cheese:  Around 1 carb,  100 cal     Dannon Light n Fit [place] Yogurt  (80 cal, 8 carbs)  Other so called "protein bars" and Greek yogurts tend to be loaded with carbohydrates.  Remember, in food advertising, the word "energy" is synonymous for " carbohydrate."  Lunch:   A Sandwich using the bread choices listed, Can use any  Eggs,  lunchmeat, grilled meat or canned tuna), avocado, regular mayo/mustard  and cheese.  A Salad using blue cheese, ranch,  Goddess or vinagrette,  No croutons or "confetti" and no "candied nuts" but regular nuts OK.   No pretzels or chips.  Pickles and miniature sweet  peppers are a good low carb alternative that provide a "crunch"  The bread is the only source of carbohydrate in a sandwich and  can be decreased by trying some of these alternatives to traditional loaf bread  Joseph's makes a pita bread and a flat bread that are 50 cal and 4 net carbs available at BJs and WalMart.  This can be toasted to use with hummous as well  Toufayan makes a low carb flatbread that's 100 cal and 9 net carbs available at [employer] and [employer] makes 2 sizes of  Low carb whole wheat tortilla  (The large one is 210 cal and 6 net carbs) Avoid "Low fat dressings, as well as Reyne Dumas and [address] dressings They are loaded with sugar!   3 PM/ Mid day  Snack:  Consider  1 ounce of  almonds, walnuts, pistachios, pecans, peanuts,  Macadamia nuts or a nut medley.  Avoid "granola"; the dried cranberries and raisins are loaded with carbohydrates. Mixed nuts as long as there are no raisins,  cranberries or dried fruit.     6 PM  Dinner:     Meat/fowl/fish with a green salad, and either broccoli, cauliflower, green beans, spinach, brussel sprouts or  Lima beans.  DO NOT BREAD THE PROTEIN!!      There is a low carb pasta by Dreamfield's that is acceptable and tastes great: only 5 digestible carbs/serving.( All grocery stores but BJs carry it )  Try Kai Levins Angelo's chicken piccata or chicken or eggplant parm over low carb pasta.(Lowes and BJs)   Clifton Custard Sanchez's "Carnitas" (pulled pork, no sauce,  0 carbs) or his beef pot roast to make a dinner burrito (at BJ's)  Pesto over low carb pasta (bj's sells a good quality pesto in the center refrigerated section of the deli   Whole wheat pasta is still full of digestible carbs and  Not as low in glycemic index as Dreamfield's.   Brown rice is still rice,  So skip the rice and noodles if you eat [place] or [place] (or at least limit to 1/2 cup)  9 PM snack :   Breyer's "low carb" fudgsicle or  ice cream bar (Carb Smart line), or  Weight  Watcher's ice cream bar , or another "no sugar added" ice cream;  a serving of fresh berries/cherries with whipped cream   Cheese or DANNON'S LlGHT N FIT [place]REEK YOGURT  Dannon's frozen chocolate banana treats (9 carbs,  100 cal)   Avoid bananas, pineapple, grapes  and watermelon on a regular basis because they are high in sugar.  THINK OF THEM AS DESSERT  Remember that snack Substitutions should be less than 10 NET carbs per serving and meals < 20 carbs. Remember to subtract fiber grams to get the "net carbs."

## 2012-05-13 NOTE — Progress Notes (Signed)
Patient ID: Meagan Cox, female   DOB: August 04, 1940, 72 y.o.   MRN: [id number]  Patient Active Problem List  Diagnosis  . Obesity  . SOB (shortness of breath)  . CAD (coronary artery disease)  . Hyperlipidemia  . HTN (hypertension)  . Hypothyroidism  . Low back pain radiating to both legs  . Hip pain, right  . Polyarthritis of ankle  . Generalized anxiety disorder  . Edema    Subjective:  CC:   Chief Complaint  Patient presents with  . Follow-up    leg pain    HPI:   Meagan Shankle Cooperis a 72 y.o. female who presents for follow up on chronic coniditions including nonobstructive CAD, hypertension, hyperlipidemia and obesity.  She has gained  Some of her weight back after an extended trip to [place] .   Several new complaints.  1) Since she has returned she has had persistent right hip pain, which includes the buttock and inner thigh and includes the anterior thigh when she stands up.  She is concerned about a "knot" she can feel in the anterior superior portion of right thigh.   She has had several adjustements in her lower back by chiropractor Dr Alfredo Bach but the relief in pain is only transient .  She has no history of trauma, and no prior imaging of hip.     2) new onset intermitted burning pain in right wrist and in the proximal joints of toes 2 through 4  Of left foot, Without redness and warmth .  She does not  wear high heels.  3) Retaining fluid in her lower extremities.  She has untreated sleep apnea due to CPAP intolerance.     Past Medical History  Diagnosis Date  . Hypertension   . Hyperlipidemia   . Hypothyroidism     Past Surgical History  Procedure Laterality Date  . Cardiac catheterization      30 years ago, [place]  . Tonsillectomy    . Partial hysterectomy    . Abdominal hysterectomy         The following portions of the patient's history were reviewed and updated as appropriate: Allergies, current medications, and problem  list.    Review of Systems:   Patient denies headache, fevers, malaise, unintentional weight loss, skin rash, eye pain, sinus congestion and sinus pain, sore throat, dysphagia,  hemoptysis , cough,  wheezing, chest pain, palpitations, orthopnea, edema, abdominal pain, nausea, melena, diarrhea, constipation, flank pain, dysuria, hematuria, urinary  Frequency, nocturia, numbness, tingling, seizures,  Focal weakness, Loss of consciousness,  Tremor, insomnia, depression, anxiety, and suicidal ideation.     History   Social History  . Marital Status: Divorced    Spouse Name: N/A    Number of Children: N/A  . Years of Education: N/A   Occupational History  . Not on file.   Social History Main Topics  . Smoking status: Former Smoker -- 20 years    Types: Cigarettes    Quit date: 01/30/1988  . Smokeless tobacco: Never Used  . Alcohol Use: Yes     Comment: social/occasional  . Drug Use: No  . Sexually Active: Not on file   Other Topics Concern  . Not on file   Social History Narrative  . No narrative on file    Objective:  BP 142/86  Pulse 71  Temp(Src) 98.1 F (36.7 C) (Oral)  Resp 16  Wt 238 lb 4 oz (108.069 kg)  BMI 43.57 kg/m2  SpO2  98%  General appearance: alert, cooperative and appears stated age Ears: normal TM's and external ear canals both ears Throat: lips, mucosa, and tongue normal; teeth and gums normal Neck: no adenopathy, no carotid bruit, supple, symmetrical, trachea midline and thyroid not enlarged, symmetric, no tenderness/mass/nodules Back: symmetric, no curvature. ROM normal. No CVA tenderness. Lungs: clear to auscultation bilaterally Heart: regular rate and rhythm, S1, S2 normal, no murmur, click, rub or gallop Abdomen: soft, non-tender; bowel sounds normal; no masses,  no organomegaly Pulses: 2+ and symmetric Skin: Skin color, texture, turgor normal. No rashes or lesions.  1+ pitting edema lower extremities to mid tibia bilaterally Lymph nodes:  Cervical, supraclavicular, and axillary nodes normal. MSK: pain in medial hip with internal and external rotation.  Tenderness to palpation anteriorly in the lateral superior thigh.   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  Referral To Juanell Fairly for evaluation and treatment   Edema Mild, with no signs of heart failure on exam, although she does have untreated OSA and nonobstructive CAD.  Prn furosemide.   Hyperlipidemia Well controlled on current regimen. Liver emzymes normal on last check, no changes today.  HTN (hypertension) Elevated mildly today  on current regimen. Renal function stable, adding prn furosemide.  Return in two weeks for BP check.  Polyarthritis of ankle Current joint complaints investigared with no signs of gout or B12 deficiency  Obesity I have addressed  BMI and recommended a low glycemic index diet utilizing smaller more frequent meals to increase metabolism.  I have also recommended that patient resume exercising with a goal of 30 minutes of aerobic exercise a minimum of 5 days per week.   A total of 40 minutes was spent with patient more than half of which was spent in counseling, reviewing records from other prviders and coordination of care.  Updated Medication List Outpatient Encounter Prescriptions as of 05/13/2012  Medication Sig Dispense Refill  . ALPRAZolam (XANAX) 0.5 MG tablet Take 1 tablet (0.5 mg total) by mouth at bedtime as needed.  30 tablet  3  . aspirin 81 MG EC tablet Take 81 mg by mouth daily.        Marland Kitchen estradiol (ESTRACE) 2 MG tablet TAKE ONE TABLET BY MOUTH EVERY DAY  90 tablet  3  . levothyroxine (SYNTHROID, LEVOTHROID) 125 MCG tablet Take 1 tablet (125 mcg total) by mouth daily.  90 tablet  3  . losartan-hydrochlorothiazide (HYZAAR) 100-25 MG per tablet Take 1 tablet by mouth daily.  90 tablet  3  . lovastatin (MEVACOR) 40  MG tablet Take 1 tablet (40 mg total) by mouth at bedtime.  90 tablet  3  . Multiple Vitamin (MULTIVITAMIN) tablet Take 1 tablet by mouth daily.        Marland Kitchen azithromycin (ZITHROMAX) 500 MG tablet Take 1 tablet (500 mg total) by mouth daily.  7 tablet  0  . benzonatate (TESSALON) 200 MG capsule Take 1 capsule (200 mg total) by mouth 2 (two) times daily as needed for cough.  60 capsule  0  . calcium carbonate (CALCIUM 500) 1250 MG tablet Take 1 tablet by mouth daily.        . furosemide (LASIX) 20 MG tablet Take 1 tablet (20 mg total) by mouth daily as needed.  30 tablet  3  . guaiFENesin-codeine (CHERATUSSIN AC) 100-10 MG/5ML syrup Take 5 mLs by mouth 3 (three) times daily as needed for  cough.  240 mL  0  . meloxicam (MOBIC) 15 MG tablet TAKE ONE TABLET BY MOUTH EVERY DAY  30 tablet  0   No facility-administered encounter medications on file as of 05/13/2012.

## 2012-05-14 ENCOUNTER — Encounter: Payer: Self-pay | Admitting: Emergency Medicine

## 2012-05-14 ENCOUNTER — Encounter: Payer: Self-pay | Admitting: *Deleted

## 2012-05-14 DIAGNOSIS — R609 Edema, unspecified: Secondary | ICD-10-CM | POA: Insufficient documentation

## 2012-05-14 NOTE — Assessment & Plan Note (Signed)
I have addressed  BMI and recommended a low glycemic index diet utilizing smaller more frequent meals to increase metabolism.  I have also recommended that patient resume exercising with a goal of 30 minutes of aerobic exercise a minimum of 5 days per week. 

## 2012-05-14 NOTE — Assessment & Plan Note (Signed)
Current joint complaints investigared with no signs of gout or B12 deficiency

## 2012-05-14 NOTE — Assessment & Plan Note (Signed)
Well controlled on current regimen. Liver emzymes normal on last check, no changes today.

## 2012-05-14 NOTE — Assessment & Plan Note (Signed)
Mild, with no signs of heart failure on exam, although she does have untreated OSA and nonobstructive CAD.  Prn furosemide.

## 2012-05-14 NOTE — Assessment & Plan Note (Signed)
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  Referral To Juanell Fairly for evaluation and treatment

## 2012-05-14 NOTE — Assessment & Plan Note (Addendum)
Elevated mildly today  on current regimen. Renal function stable, adding prn furosemide.  Return in two weeks for BP check.

## 2012-05-20 ENCOUNTER — Other Ambulatory Visit: Payer: Self-pay | Admitting: Internal Medicine

## 2012-06-10 ENCOUNTER — Other Ambulatory Visit: Payer: Self-pay | Admitting: Internal Medicine

## 2012-06-10 ENCOUNTER — Other Ambulatory Visit: Payer: Self-pay | Admitting: *Deleted

## 2012-06-10 MED ORDER — LOSARTAN POTASSIUM-HCTZ 100-25 MG PO TABS: 1.0000 | ORAL_TABLET | Freq: Every day | ORAL | Status: DC

## 2012-06-10 NOTE — Telephone Encounter (Signed)
Rx sent to pharmacy by escript  

## 2012-06-11 ENCOUNTER — Ambulatory Visit: Payer: Self-pay | Admitting: Orthopedic Surgery

## 2012-07-22 ENCOUNTER — Other Ambulatory Visit: Payer: Self-pay | Admitting: *Deleted

## 2012-07-22 MED ORDER — ESTRADIOL 2 MG PO TABS: ORAL_TABLET | ORAL | Status: DC

## 2012-10-08 ENCOUNTER — Ambulatory Visit (INDEPENDENT_AMBULATORY_CARE_PROVIDER_SITE_OTHER): Payer: Medicare Other | Admitting: Internal Medicine

## 2012-10-08 ENCOUNTER — Encounter: Payer: Self-pay | Admitting: Internal Medicine

## 2012-10-08 VITALS — BP 138/72 | HR 81 | Temp 98.7°F | Resp 14 | Ht 62.0 in | Wt 247.8 lb

## 2012-10-08 DIAGNOSIS — Z23 Encounter for immunization: Secondary | ICD-10-CM

## 2012-10-08 DIAGNOSIS — I10 Essential (primary) hypertension: Secondary | ICD-10-CM

## 2012-10-08 DIAGNOSIS — M25551 Pain in right hip: Secondary | ICD-10-CM

## 2012-10-08 DIAGNOSIS — E669 Obesity, unspecified: Secondary | ICD-10-CM

## 2012-10-08 DIAGNOSIS — Z888 Allergy status to other drugs, medicaments and biological substances status: Secondary | ICD-10-CM

## 2012-10-08 DIAGNOSIS — M25559 Pain in unspecified hip: Secondary | ICD-10-CM

## 2012-10-08 DIAGNOSIS — Z789 Other specified health status: Secondary | ICD-10-CM

## 2012-10-08 MED ORDER — TETANUS-DIPHTH-ACELL PERTUSSIS 5-2.5-18.5 LF-MCG/0.5 IM SUSP: 0.5000 mL | Freq: Once | INTRAMUSCULAR | Status: DC

## 2012-10-08 MED ORDER — PHENTERMINE HCL 37.5 MG PO TABS: ORAL_TABLET | ORAL | Status: DC

## 2012-10-08 NOTE — Progress Notes (Signed)
Patient ID: Francis Yardley, female   DOB: 23-Nov-1940, 72 y.o.   MRN: [id number]   Patient Active Problem List   Diagnosis Date Noted  . Statin intolerance 10/08/2012  . Edema 05/14/2012  . Generalized anxiety disorder 01/06/2012  . 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 Hypothyroidism 12/18/2010  . Low back pain radiating to both legs 12/18/2010  . Hip pain, right 12/18/2010  . Obesity 05/09/2010  . SOB (shortness of breath) 05/09/2010  . CAD (coronary artery disease) 05/09/2010  . Hyperlipidemia 05/09/2010  . HTN (hypertension) 05/09/2010    Subjective:  CC:   Chief Complaint  Patient presents with  . Acute Visit    coming off BP meds    HPI:   Aireana Ryland Cooperis a 72 y.o. female who presents for followup on chronic conditions including hip pain, hypertension, CAD  and obesity. After her last visit she was referred to physiatry is Dr. Yves Dill who apparently referred her to rheumatology followed by neurology followed by orthopedics. Hip replacement advised by Lavenia Atlas but patient declined. She finally decided to stop her statin ,  And pain resolved.  She has been using diclofenac when necessary during this time as also noticed that her blood pressure has been elevated on several occasions. However she is also been taking an over-the-counter appetite suppressant but has not lost any weight despite taking it for over 10 days.    Past Medical History  Diagnosis Date  . Hypertension   . Hyperlipidemia   . Hypothyroidism     Past Surgical History  Procedure Laterality Date  . Cardiac catheterization      30 years ago, [place]  . Tonsillectomy    . Partial hysterectomy    . Abdominal hysterectomy         The following portions of the patient's history were reviewed and updated as appropriate: Allergies, current medications, and problem list.    Review of Systems:   12 Pt  review of systems was negative except those addressed in the  HPI,     History   Social History  . Marital Status: Divorced    Spouse Name: N/A    Number of Children: N/A  . Years of Education: N/A   Occupational History  . Not on file.   Social History Main Topics  . Smoking status: Former Smoker -- 20 years    Types: Cigarettes    Quit date: 01/30/1988  . Smokeless tobacco: Never Used  . Alcohol Use: Yes     Comment: social/occasional  . Drug Use: No  . Sexual Activity: Not on file   Other Topics Concern  . Not on file   Social History Narrative  . No narrative on file    Objective:  Filed Vitals:   10/08/12 1434  BP: 138/72  Pulse: 81  Temp: 98.7 F (37.1 C)  Resp: 14     General appearance: alert, cooperative and appears stated age Ears: normal TM's and external ear canals both ears Throat: lips, mucosa, and tongue normal; teeth and gums normal Neck: no adenopathy, no carotid bruit, supple, symmetrical, trachea midline and thyroid not enlarged, symmetric, no tenderness/mass/nodules Back: symmetric, no curvature. ROM normal. No CVA tenderness. Lungs: clear to auscultation bilaterally Heart: regular rate and rhythm, S1, S2 normal, no murmur, click, rub or gallop Abdomen: soft, non-tender; bowel sounds normal; no masses,  no organomegaly Pulses: 2+ and symmetric Skin: Skin color, texture, turgor normal. No rashes or lesions Lymph nodes: Cervical, supraclavicular, and  axillary nodes normal.  Assessment and Plan:  Hip pain, right She has no evidence of degenerative joint disease or bursitis. Her pain resolved when she stopped her cholesterol medication. She is now using diclofenac when necessary.  HTN (hypertension) Discussed several medications which may be elevating her blood pressure including diclofenac and her appetite suppressant. She will stop both of these and rib  Obesity I have addressed  BMI and recommended wt loss of 10% of body weigh over the next 6 months using a low glycemic index diet and regular  exercise a minimum of 5 days per week. She is struggling with weight loss and is following a very good diet and when talking as much as her orthopedic issues will allow. Discussed a trial of phentermine with close monitoring of her blood pressure.    Updated Medication List Outpatient Encounter Prescriptions as of 10/08/2012  Medication Sig Dispense Refill  . ALPRAZolam (XANAX) 0.5 MG tablet Take 1 tablet (0.5 mg total) by mouth at bedtime as needed.  30 tablet  3  . aspirin 81 MG EC tablet Take 81 mg by mouth daily.        . diclofenac (CATAFLAM) 50 MG tablet Take 1 tablet by mouth 2 (two) times daily after a meal.      . estradiol (ESTRACE) 2 MG tablet TAKE ONE TABLET BY MOUTH EVERY DAY  90 tablet  1  . levothyroxine (SYNTHROID, LEVOTHROID) 125 MCG tablet Take 1 tablet (125 mcg total) by mouth daily.  90 tablet  3  . lovastatin (MEVACOR) 40 MG tablet TAKE ONE TABLET BY MOUTH AT BEDTIME  90 tablet  0  . Multiple Vitamin (MULTIVITAMIN) tablet Take 1 tablet by mouth daily.        Marland Kitchen azithromycin (ZITHROMAX) 500 MG tablet Take 1 tablet (500 mg total) by mouth daily.  7 tablet  0  . benzonatate (TESSALON) 200 MG capsule Take 1 capsule (200 mg total) by mouth 2 (two) times daily as needed for cough.  60 capsule  0  . calcium carbonate (CALCIUM 500) 1250 MG tablet Take 1 tablet by mouth daily.        . furosemide (LASIX) 20 MG tablet Take 1 tablet (20 mg total) by mouth daily as needed.  30 tablet  3  . guaiFENesin-codeine (CHERATUSSIN AC) 100-10 MG/5ML syrup Take 5 mLs by mouth 3 (three) times daily as needed for cough.  240 mL  0  . losartan-hydrochlorothiazide (HYZAAR) 100-25 MG per tablet Take 1 tablet by mouth daily.  90 tablet  1  . meloxicam (MOBIC) 15 MG tablet TAKE ONE TABLET BY MOUTH EVERY DAY  30 tablet  0  . phentermine (ADIPEX-P) 37.5 MG tablet 1/2 tablet twice daily  30 tablet  0  . TDaP (BOOSTRIX) 5-2.5-18.5 LF-MCG/0.5 injection Inject 0.5 mLs into the muscle once.  0.5 mL  0   No  facility-administered encounter medications on file as of 10/08/2012.     Orders Placed This Encounter  Procedures  . Flu Vaccine QUAD 36+ mos PF IM (Fluarix)    No Follow-up on file.

## 2012-10-08 NOTE — Patient Instructions (Addendum)
The diclofenac may be elevating your blood pressure so dc it and if after 48 hours your bp drops to below 120/60,  Stop the losartan .  contineu 1/2 pill for now   The miratrim may be elevating your bp   Stop the  miratrim  And try the phentermine  Add one more day of water aerobics  When you are not hungry, drink a protein shake

## 2012-10-09 ENCOUNTER — Encounter: Payer: Self-pay | Admitting: Internal Medicine

## 2012-10-09 NOTE — Assessment & Plan Note (Signed)
I have addressed  BMI and recommended wt loss of 10% of body weigh over the next 6 months using a low glycemic index diet and regular exercise a minimum of 5 days per week. She is struggling with weight loss and is following a very good diet and when talking as much as her orthopedic issues will allow. Discussed a trial of phentermine with close monitoring of her blood pressure.

## 2012-10-09 NOTE — Assessment & Plan Note (Signed)
Discussed several medications which may be elevating her blood pressure including diclofenac and her appetite suppressant. She will stop both of these and rib

## 2012-10-09 NOTE — Assessment & Plan Note (Signed)
She has no evidence of degenerative joint disease or bursitis. Her pain resolved when she stopped her cholesterol medication. She is now using diclofenac when necessary.

## 2012-11-04 ENCOUNTER — Ambulatory Visit (INDEPENDENT_AMBULATORY_CARE_PROVIDER_SITE_OTHER): Payer: Medicare Other | Admitting: Internal Medicine

## 2012-11-04 ENCOUNTER — Encounter: Payer: Self-pay | Admitting: Internal Medicine

## 2012-11-04 VITALS — BP 118/84 | HR 82 | Temp 98.5°F | Wt 236.0 lb

## 2012-11-04 DIAGNOSIS — R609 Edema, unspecified: Secondary | ICD-10-CM

## 2012-11-04 DIAGNOSIS — M25551 Pain in right hip: Secondary | ICD-10-CM

## 2012-11-04 DIAGNOSIS — Z23 Encounter for immunization: Secondary | ICD-10-CM

## 2012-11-04 DIAGNOSIS — F419 Anxiety disorder, unspecified: Secondary | ICD-10-CM

## 2012-11-04 DIAGNOSIS — F411 Generalized anxiety disorder: Secondary | ICD-10-CM

## 2012-11-04 DIAGNOSIS — E669 Obesity, unspecified: Secondary | ICD-10-CM

## 2012-11-04 DIAGNOSIS — E039 Hypothyroidism, unspecified: Secondary | ICD-10-CM

## 2012-11-04 DIAGNOSIS — Z888 Allergy status to other drugs, medicaments and biological substances status: Secondary | ICD-10-CM

## 2012-11-04 DIAGNOSIS — M25559 Pain in unspecified hip: Secondary | ICD-10-CM

## 2012-11-04 DIAGNOSIS — Z79899 Other long term (current) drug therapy: Secondary | ICD-10-CM

## 2012-11-04 DIAGNOSIS — I10 Essential (primary) hypertension: Secondary | ICD-10-CM

## 2012-11-04 DIAGNOSIS — Z789 Other specified health status: Secondary | ICD-10-CM

## 2012-11-04 LAB — COMPREHENSIVE METABOLIC PANEL
Albumin: 3.9 g/dL (ref 3.5–5.2)
Alkaline Phosphatase: 50 U/L (ref 39–117)
BUN: 18 mg/dL (ref 6–23)
CO2: 31 mEq/L (ref 19–32)
GFR: 83.59 mL/min (ref >60.00)
Glucose, Bld: 99 mg/dL (ref 70–99)
Potassium: 3.9 mEq/L (ref 3.5–5.1)
Total Bilirubin: 0.5 mg/dL (ref 0.3–1.2)

## 2012-11-04 LAB — TSH: TSH: 2.07 u[IU]/mL (ref 0.35–5.50)

## 2012-11-04 MED ORDER — ALPRAZOLAM 0.5 MG PO TABS: 0.5000 mg | ORAL_TABLET | Freq: Every evening | ORAL | Status: DC | PRN

## 2012-11-04 MED ORDER — PHENTERMINE HCL 37.5 MG PO TABS: ORAL_TABLET | ORAL | Status: DC

## 2012-11-04 MED ORDER — LOSARTAN POTASSIUM-HCTZ 100-25 MG PO TABS: 0.5000 | ORAL_TABLET | Freq: Every day | ORAL | Status: DC

## 2012-11-04 NOTE — Patient Instructions (Addendum)
You are doing well  continue the phentermine for 3 more months and return   reduce your aspirin to every other day   You received the pneumonia vaccine today

## 2012-11-04 NOTE — Progress Notes (Signed)
Patient ID: Meagan Cox, female   DOB: 05-08-1940, 72 y.o.   MRN: [id number]  Patient Active Problem List   Diagnosis Date Noted  . Statin intolerance 10/08/2012  . Edema 05/14/2012  . Generalized anxiety disorder 01/06/2012  . 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 Hypothyroidism 12/18/2010  . Low back pain radiating to both legs 12/18/2010  . Hip pain, right 12/18/2010  . Obesity 05/09/2010  . SOB (shortness of breath) 05/09/2010  . CAD (coronary artery disease) 05/09/2010  . Hyperlipidemia 05/09/2010  . HTN (hypertension) 05/09/2010    Subjective:  CC:   Chief Complaint  Patient presents with  . Follow-up    1 month    HPI:   Meagan Madara Cooperis a 72 y.o. female who presents for One month follow up on hip pain and elevated blood pressure.  Her hip pain has significantly improved since stopping mevacor, buty is not  Completely resolved. It is limiting her inclination to exercise; howeve she admits she is never inclined to exercise. Her blood pressures have been normal at home since she stopped the diclofenac. She has lost 10 lbs since last visit by our schools, and has been taking phentermine which is suppressing her appetite. Diet reviewed.  She is reating low carb toast  With peanut butter or pimento cheese or the EAS carb control shake for breakfast,  sometimes skps lunch ,  Drinks  A V8 product as a mid morning snack. Dinner is a grilled or baked meat,  A green vegetable and a salad. Does not eat cakes,  Cookies,   Dinner rolls, very rarely has pasta or potato.    Past Medical History  Diagnosis Date  . Hypertension   . Hyperlipidemia   . Hypothyroidism     Past Surgical History  Procedure Laterality Date  . Cardiac catheterization      30 years ago, [place]  . Tonsillectomy    . Partial hysterectomy    . Abdominal hysterectomy         The following portions of the patient's history were reviewed and updated as appropriate: Allergies, current  medications, and problem list.    Review of Systems:   12 Pt  review of systems was negative except those addressed in the HPI,     History   Social History  . Marital Status: Divorced    Spouse Name: N/A    Number of Children: N/A  . Years of Education: N/A   Occupational History  . Not on file.   Social History Main Topics  . Smoking status: Former Smoker -- 20 years    Types: Cigarettes    Quit date: 01/30/1988  . Smokeless tobacco: Never Used  . Alcohol Use: 2.4 oz/week    4 Glasses of wine per week     Comment: social/occasional  . Drug Use: No  . Sexual Activity: Not Currently   Other Topics Concern  . Not on file   Social History Narrative  . No narrative on file    Objective:  Filed Vitals:   11/04/12 1000  BP: 118/84  Pulse: 82  Temp: 98.5 F (36.9 C)     General appearance: alert, cooperative and appears stated age Neck: no adenopathy, no carotid bruit, supple, symmetrical, trachea midline and thyroid not enlarged, symmetric, no tenderness/mass/nodules Back: symmetric, no curvature. ROM normal. No CVA tenderness. Lungs: clear to auscultation bilaterally Heart: regular rate and rhythm, S1, S2 normal, no murmur, click, rub or gallop Abdomen: soft, non-tender; bowel  sounds normal; no masses,  no organomegaly Pulses: 2+ and symmetric Skin: Skin color, texture, turgor normal. No rashes or lesions.  No edema today Lymph nodes: Cervical, supraclavicular, and axillary nodes normal.  Assessment and Plan:  Obesity Improving with low carb diet and appetite suppressant.  Encouraged to start exercising. Return in 3 months   Statin intolerance Hip and thigh pain have imporved but not resolved with statin discontinuation.  She has had orthopedic evaluation which was normal.   Hip pain, right Normal orthopedic and physiatrist eval.  Secondary to statin intolerance and obesity with very mild DJD.   HTN (hypertension) Well controlled on current  regimen. Avoid NSAIDs. Renal function stable, no changes today.  Edema secondary to VI and untreated OSA.  Using prn  furosemide,  Encouraged to walk since she is not interested in using compression stockings.    Updated Medication List Outpatient Encounter Prescriptions as of 11/04/2012  Medication Sig Dispense Refill  . ALPRAZolam (XANAX) 0.5 MG tablet Take 1 tablet (0.5 mg total) by mouth at bedtime as needed.  30 tablet  3  . aspirin 81 MG EC tablet Take 81 mg by mouth daily.        . Cyanocobalamin (B-12) 2500 MCG TABS Take by mouth.      . estradiol (ESTRACE) 2 MG tablet TAKE ONE TABLET BY MOUTH EVERY DAY  90 tablet  1  . levothyroxine (SYNTHROID, LEVOTHROID) 125 MCG tablet Take 1 tablet (125 mcg total) by mouth daily.  90 tablet  3  . losartan-hydrochlorothiazide (HYZAAR) 100-25 MG per tablet Take 0.5 tablets by mouth daily.  90 tablet  1  . Multiple Vitamin (MULTIVITAMIN) tablet Take 1 tablet by mouth daily.        . phentermine (ADIPEX-P) 37.5 MG tablet 1/2 tablet twice daily  30 tablet  3  . [DISCONTINUED] ALPRAZolam (XANAX) 0.5 MG tablet Take 1 tablet (0.5 mg total) by mouth at bedtime as needed.  30 tablet  3  . [DISCONTINUED] azithromycin (ZITHROMAX) 500 MG tablet Take 1 tablet (500 mg total) by mouth daily.  7 tablet  0  . [DISCONTINUED] benzonatate (TESSALON) 200 MG capsule Take 1 capsule (200 mg total) by mouth 2 (two) times daily as needed for cough.  60 capsule  0  . [DISCONTINUED] guaiFENesin-codeine (CHERATUSSIN AC) 100-10 MG/5ML syrup Take 5 mLs by mouth 3 (three) times daily as needed for cough.  240 mL  0  . [DISCONTINUED] losartan-hydrochlorothiazide (HYZAAR) 100-25 MG per tablet Take 1 tablet by mouth daily.  90 tablet  1  . [DISCONTINUED] phentermine (ADIPEX-P) 37.5 MG tablet 1/2 tablet twice daily  30 tablet  0  . [DISCONTINUED] TDaP (BOOSTRIX) 5-2.5-18.5 LF-MCG/0.5 injection Inject 0.5 mLs into the muscle once.  0.5 mL  0  . furosemide (LASIX) 20 MG tablet Take 1  tablet (20 mg total) by mouth daily as needed.  30 tablet  3  . [DISCONTINUED] calcium carbonate (CALCIUM 500) 1250 MG tablet Take 1 tablet by mouth daily.        . [DISCONTINUED] diclofenac (CATAFLAM) 50 MG tablet Take 1 tablet by mouth 2 (two) times daily after a meal.      . [DISCONTINUED] lovastatin (MEVACOR) 40 MG tablet TAKE ONE TABLET BY MOUTH AT BEDTIME  90 tablet  0  . [DISCONTINUED] meloxicam (MOBIC) 15 MG tablet TAKE ONE TABLET BY MOUTH EVERY DAY  30 tablet  0   No facility-administered encounter medications on file as of 11/04/2012.     Orders  Placed This Encounter  Procedures  . Pneumococcal polysaccharide vaccine 23-valent greater than or equal to 2yo subcutaneous/IM  . Comprehensive metabolic panel  . TSH    Return in about 3 months (around 02/04/2013).

## 2012-11-05 ENCOUNTER — Encounter: Payer: Self-pay | Admitting: Internal Medicine

## 2012-11-05 NOTE — Assessment & Plan Note (Signed)
Well controlled on current regimen. Avoid NSAIDs. Renal function stable, no changes today.

## 2012-11-05 NOTE — Assessment & Plan Note (Signed)
Improving with low carb diet and appetite suppressant.  Encouraged to start exercising. Return in 3 months

## 2012-11-05 NOTE — Assessment & Plan Note (Signed)
Hip and thigh pain have imporved but not resolved with statin discontinuation.  She has had orthopedic evaluation which was normal.

## 2012-11-05 NOTE — Assessment & Plan Note (Signed)
Normal orthopedic and physiatrist eval.  Secondary to statin intolerance and obesity with very mild DJD.

## 2012-11-05 NOTE — Assessment & Plan Note (Addendum)
secondary to VI and untreated OSA.  Using prn  furosemide,  Encouraged to walk since she is not interested in using compression stockings.

## 2012-11-06 ENCOUNTER — Encounter: Payer: Self-pay | Admitting: Internal Medicine

## 2013-01-03 ENCOUNTER — Other Ambulatory Visit: Payer: Self-pay | Admitting: Internal Medicine

## 2013-02-10 ENCOUNTER — Telehealth: Payer: Self-pay | Admitting: Internal Medicine

## 2013-02-10 MED ORDER — ZOSTER VACCINE LIVE 19400 UNT/0.65ML ~~LOC~~ SOLR: 0.6500 mL | Freq: Once | SUBCUTANEOUS | Status: DC

## 2013-02-10 NOTE — Telephone Encounter (Signed)
Patient did leave message today please advise if she can have script for Zoster vax

## 2013-02-10 NOTE — Telephone Encounter (Signed)
Pt states she left a vm to have shingles vaccine sent to [facility].  Pt states she was to get one several years ago but never got it.

## 2013-02-10 NOTE — Telephone Encounter (Signed)
Pease confirm [facility] or [facility]?  Butch Penny and chart do not agree ,  rx printed for zostavax

## 2013-02-11 NOTE — Telephone Encounter (Signed)
Tried to reach patient reached recording phone has been disconnected.

## 2013-02-11 NOTE — Telephone Encounter (Signed)
Mailed script to patient

## 2013-04-14 ENCOUNTER — Other Ambulatory Visit: Payer: Self-pay | Admitting: Internal Medicine

## 2013-04-14 ENCOUNTER — Telehealth: Payer: Self-pay | Admitting: *Deleted

## 2013-04-14 NOTE — Telephone Encounter (Signed)
Refill Request  Adipex-P 37.5mg  tab  Take one- half tablet by mouth twice daily   #30

## 2013-04-14 NOTE — Telephone Encounter (Signed)
Attempted to call patient to advise need for appt, number has been disconnected.

## 2013-04-20 ENCOUNTER — Other Ambulatory Visit: Payer: Self-pay | Admitting: *Deleted

## 2013-04-20 NOTE — Telephone Encounter (Signed)
Okay to refill? 

## 2013-04-29 ENCOUNTER — Ambulatory Visit (INDEPENDENT_AMBULATORY_CARE_PROVIDER_SITE_OTHER): Payer: Medicare Other | Admitting: Internal Medicine

## 2013-04-29 ENCOUNTER — Encounter: Payer: Self-pay | Admitting: Internal Medicine

## 2013-04-29 VITALS — BP 110/78 | HR 68 | Temp 97.5°F | Resp 18 | Wt 225.2 lb

## 2013-04-29 DIAGNOSIS — E785 Hyperlipidemia, unspecified: Secondary | ICD-10-CM

## 2013-04-29 DIAGNOSIS — E669 Obesity, unspecified: Secondary | ICD-10-CM

## 2013-04-29 DIAGNOSIS — M25559 Pain in unspecified hip: Secondary | ICD-10-CM

## 2013-04-29 DIAGNOSIS — E039 Hypothyroidism, unspecified: Secondary | ICD-10-CM

## 2013-04-29 DIAGNOSIS — I10 Essential (primary) hypertension: Secondary | ICD-10-CM

## 2013-04-29 DIAGNOSIS — M25551 Pain in right hip: Secondary | ICD-10-CM

## 2013-04-29 DIAGNOSIS — Z79899 Other long term (current) drug therapy: Secondary | ICD-10-CM

## 2013-04-29 LAB — COMPREHENSIVE METABOLIC PANEL
ALT: 16 U/L (ref 0–35)
AST: 17 U/L (ref 0–37)
Albumin: 3.6 g/dL (ref 3.5–5.2)
Alkaline Phosphatase: 58 U/L (ref 39–117)
BUN: 16 mg/dL (ref 6–23)
CALCIUM: 9.2 mg/dL (ref 8.4–10.5)
CHLORIDE: 102 meq/L (ref 96–112)
CO2: 28 mEq/L (ref 19–32)
Creatinine, Ser: 0.6 mg/dL (ref 0.4–1.2)
GFR: 98.94 mL/min (ref >60.00)
GLUCOSE: 87 mg/dL (ref 70–99)
Potassium: 4.3 mEq/L (ref 3.5–5.1)
Sodium: 137 mEq/L (ref 135–145)
TOTAL PROTEIN: 6.3 g/dL (ref 6.0–8.3)
Total Bilirubin: 0.4 mg/dL (ref 0.3–1.2)

## 2013-04-29 LAB — TSH: TSH: 4.47 u[IU]/mL (ref 0.35–5.50)

## 2013-04-29 MED ORDER — PHENTERMINE HCL 37.5 MG PO TABS: ORAL_TABLET | ORAL | Status: DC

## 2013-04-29 MED ORDER — PHENTERMINE HCL 15 MG PO CAPS: ORAL_CAPSULE | ORAL | Status: DC

## 2013-04-29 NOTE — Patient Instructions (Addendum)
Try adding a tablespoon of citrucel or benefiber to  one of your shakes  If still getting too hungry    We are going to try the 15 mg capsule form so you have more flexivility.  maximum dose 30 mg daily   Return in 3 months

## 2013-04-29 NOTE — Progress Notes (Signed)
Pre-visit discussion using our clinic review tool. No additional management support is needed unless otherwise documented below in the visit note.  

## 2013-04-29 NOTE — Progress Notes (Signed)
Patient ID: Meagan Cox, female   DOB: 04-24-1940, 73 y.o.   MRN: [id number]  Patient Active Problem List   Diagnosis Date Noted  . Statin intolerance 10/08/2012  . Edema 05/14/2012  . Generalized anxiety disorder 01/06/2012  . 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 Hypothyroidism 12/18/2010  . Low back pain radiating to both legs 12/18/2010  . Hip pain, right 12/18/2010  . Obesity 05/09/2010  . SOB (shortness of breath) 05/09/2010  . CAD (coronary artery disease) 05/09/2010  . Hyperlipidemia 05/09/2010  . HTN (hypertension) 05/09/2010    Subjective:  CC:   Chief Complaint  Patient presents with  . Follow-up    weight loss and diet medication    HPI:   Meagan Cox is a 73 y.o. female who presents for follow up on hypertension, hyperlipidemia, and  morbid obesity.  She started phentermine for appetite suppression in October.  Lost 5% of body weight by January but began having persistent dizziness in January that became more severe.  She suffered a fall in February and struck the  back of head on a piano stool while standing on a short step stool  suting her curtain rods.  She stopped the phentermine and the dizziness stopped, but she started gaining weight again in February.  Resumed the phentermine on March 1 at reduced dose 1/2 pill daily and so far has not had any dizziness.  Her BP has been normal with resuming of medications.  ,  But she has noticed fluid retention starting and increased appetite on reduced dose ,  Has changed the timing to taking t 1/2 tablet later in the afternoon   Past Medical History  Diagnosis Date  . Hypertension   . Hyperlipidemia   . Hypothyroidism     Past Surgical History  Procedure Laterality Date  . Cardiac catheterization      30 years ago, [place]  . Tonsillectomy    . Partial hysterectomy    . Abdominal hysterectomy         The following portions of the patient's history were reviewed and updated as appropriate:  Allergies, current medications, and problem list.    Review of Systems:   Patient denies headache, fevers, malaise, unintentional weight loss, skin rash, eye pain, sinus congestion and sinus pain, sore throat, dysphagia,  hemoptysis , cough, dyspnea, wheezing, chest pain, palpitations, orthopnea, edema, abdominal pain, nausea, melena, diarrhea, constipation, flank pain, dysuria, hematuria, urinary  Frequency, nocturia, numbness, tingling, seizures,  Focal weakness, Loss of consciousness,  Tremor, insomnia, depression, anxiety, and suicidal ideation.     History   Social History  . Marital Status: Divorced    Spouse Name: N/A    Number of Children: N/A  . Years of Education: N/A   Occupational History  . Not on file.   Social History Main Topics  . Smoking status: Former Smoker -- 20 years    Types: Cigarettes    Quit date: 01/30/1988  . Smokeless tobacco: Never Used  . Alcohol Use: 2.4 oz/week    4 Glasses of wine per week     Comment: social/occasional  . Drug Use: No  . Sexual Activity: Not Currently   Other Topics Concern  . Not on file   Social History Narrative  . No narrative on file    Objective:  Filed Vitals:   04/29/13 0848  BP: 110/78  Pulse: 68  Temp: 97.5 F (36.4 C)  Resp: 18     General appearance: alert, cooperative  and appears stated age Ears: normal TM's and external ear canals both ears Throat: lips, mucosa, and tongue normal; teeth and gums normal Neck: no adenopathy, no carotid bruit, supple, symmetrical, trachea midline and thyroid not enlarged, symmetric, no tenderness/mass/nodules Back: symmetric, no curvature. ROM normal. No CVA tenderness. Lungs: clear to auscultation bilaterally Heart: regular rate and rhythm, S1, S2 normal, no murmur, click, rub or gallop Abdomen: soft, non-tender; bowel sounds normal; no masses,  no organomegaly Pulses: 2+ and symmetric Skin: Skin color, texture, turgor normal. No rashes or lesions Lymph nodes:  Cervical, supraclavicular, and axillary nodes normal.  Assessment and Plan:  Obesity Improving with low carb diet and appetite suppressant.  Dose changed to 15 mg capsules to take once or twice daily. Encouraged to start exercising. Return in 3 months     HTN (hypertension) Well controlled on current regimen. Renal function stable, no changes today.  Hyperlipidemia Untreated secondary to statin intolerance.   Hypothyroidism Thyroid function is WNL on current dose but given need for weight loss will increase dose for goal TSH of 1.0   Hip pain, right Normal orthopedic and physiatrist evaluation except for mild DJD hip.  Rheumatolog presceibed meloxican 7.5 mg daily. Symptoms improved with discontinuation of statin and reduction of BMI    Updated Medication List Outpatient Encounter Prescriptions as of 04/29/2013  Medication Sig  . ALPRAZolam (XANAX) 0.5 MG tablet Take 1 tablet (0.5 mg total) by mouth at bedtime as needed.  . Cyanocobalamin (B-12) 2500 MCG TABS Take by mouth.  . estradiol (ESTRACE) 2 MG tablet TAKE ONE TABLET BY MOUTH ONCE DAILY  . levothyroxine (SYNTHROID, LEVOTHROID) 137 MCG tablet Take 1 tablet (137 mcg total) by mouth daily.  Marland Kitchen losartan-hydrochlorothiazide (HYZAAR) 100-25 MG per tablet Take 0.5 tablets by mouth daily.  . Multiple Vitamin (MULTIVITAMIN) tablet Take 1 tablet by mouth daily.    . [DISCONTINUED] levothyroxine (SYNTHROID, LEVOTHROID) 125 MCG tablet Take 1 tablet (125 mcg total) by mouth daily.  . [DISCONTINUED] phentermine (ADIPEX-P) 37.5 MG tablet 1/2 tablet twice daily  . [DISCONTINUED] phentermine (ADIPEX-P) 37.5 MG tablet 1/2 tablet twice daily  . [DISCONTINUED] zoster vaccine live, PF, (ZOSTAVAX) 49449 UNT/0.65ML injection Inject 19,400 Units into the skin once.  Marland Kitchen aspirin 81 MG EC tablet Take 81 mg by mouth daily.    . furosemide (LASIX) 20 MG tablet Take 1 tablet (20 mg total) by mouth daily as needed.  . phentermine 15 MG capsule 2 times daily   . [DISCONTINUED] losartan-hydrochlorothiazide (HYZAAR) 100-25 MG per tablet TAKE ONE TABLET BY MOUTH ONCE DAILY     Orders Placed This Encounter  Procedures  . Comprehensive metabolic panel  . TSH    No Follow-up on file.

## 2013-04-30 ENCOUNTER — Encounter: Payer: Self-pay | Admitting: Internal Medicine

## 2013-05-01 ENCOUNTER — Encounter: Payer: Self-pay | Admitting: Internal Medicine

## 2013-05-01 MED ORDER — LEVOTHYROXINE SODIUM 137 MCG PO TABS: 137.0000 ug | ORAL_TABLET | Freq: Every day | ORAL | Status: DC

## 2013-05-01 NOTE — Assessment & Plan Note (Signed)
Normal orthopedic and physiatrist evaluation except for mild DJD hip.  Rheumatolog presceibed meloxican 7.5 mg daily. Symptoms improved with discontinuation of statin and reduction of BMI

## 2013-05-01 NOTE — Assessment & Plan Note (Signed)
Well controlled on current regimen. Renal function stable, no changes today. 

## 2013-05-01 NOTE — Assessment & Plan Note (Addendum)
Thyroid function is WNL on current dose but given need for weight loss will increase dose for goal TSH of 1.0

## 2013-05-01 NOTE — Addendum Note (Signed)
Addended by: Crecencio Mc on: 05/01/2013 04:01 PM   Modules accepted: Orders

## 2013-05-01 NOTE — Assessment & Plan Note (Signed)
Untreated secondary to statin intolerance.  

## 2013-05-01 NOTE — Assessment & Plan Note (Addendum)
Improving with low carb diet and appetite suppressant.  Dose changed to 15 mg capsules to take once or twice daily. Encouraged to start exercising. Return in 3 months

## 2013-05-04 DIAGNOSIS — Z0279 Encounter for issue of other medical certificate: Secondary | ICD-10-CM

## 2013-05-13 ENCOUNTER — Encounter: Payer: Self-pay | Admitting: Internal Medicine

## 2013-05-18 ENCOUNTER — Other Ambulatory Visit: Payer: Self-pay | Admitting: Internal Medicine

## 2013-06-09 ENCOUNTER — Encounter: Payer: Self-pay | Admitting: Internal Medicine

## 2013-06-09 DIAGNOSIS — F419 Anxiety disorder, unspecified: Secondary | ICD-10-CM

## 2013-06-10 MED ORDER — ALPRAZOLAM 0.5 MG PO TABS: 0.5000 mg | ORAL_TABLET | Freq: Every evening | ORAL | Status: DC | PRN

## 2013-07-30 ENCOUNTER — Ambulatory Visit: Payer: Medicare Other | Admitting: Internal Medicine

## 2013-09-07 ENCOUNTER — Encounter: Payer: Self-pay | Admitting: Internal Medicine

## 2013-09-07 MED ORDER — ESTRADIOL 2 MG PO TABS: ORAL_TABLET | ORAL | Status: DC

## 2013-10-20 ENCOUNTER — Ambulatory Visit: Payer: Self-pay | Admitting: Otolaryngology

## 2013-11-13 ENCOUNTER — Other Ambulatory Visit: Payer: Self-pay

## 2013-11-19 ENCOUNTER — Encounter: Payer: Self-pay | Admitting: Internal Medicine

## 2013-11-19 ENCOUNTER — Other Ambulatory Visit (INDEPENDENT_AMBULATORY_CARE_PROVIDER_SITE_OTHER): Payer: Medicare Other

## 2013-11-19 ENCOUNTER — Ambulatory Visit (INDEPENDENT_AMBULATORY_CARE_PROVIDER_SITE_OTHER)
Admission: RE | Admit: 2013-11-19 | Discharge: 2013-11-19 | Disposition: A | Discharge status: Home / Self Care | Payer: Medicare Other | Source: Ambulatory Visit | Attending: Internal Medicine | Admitting: Internal Medicine

## 2013-11-19 ENCOUNTER — Ambulatory Visit (INDEPENDENT_AMBULATORY_CARE_PROVIDER_SITE_OTHER): Payer: Medicare Other | Admitting: Internal Medicine

## 2013-11-19 VITALS — BP 120/88 | HR 57 | Temp 98.0°F | Resp 16 | Ht 62.0 in | Wt 217.8 lb

## 2013-11-19 DIAGNOSIS — Z79899 Other long term (current) drug therapy: Secondary | ICD-10-CM

## 2013-11-19 DIAGNOSIS — M25511 Pain in right shoulder: Secondary | ICD-10-CM

## 2013-11-19 DIAGNOSIS — I10 Essential (primary) hypertension: Secondary | ICD-10-CM

## 2013-11-19 DIAGNOSIS — Z23 Encounter for immunization: Secondary | ICD-10-CM

## 2013-11-19 DIAGNOSIS — E038 Other specified hypothyroidism: Secondary | ICD-10-CM

## 2013-11-19 DIAGNOSIS — E039 Hypothyroidism, unspecified: Secondary | ICD-10-CM | POA: Diagnosis not present

## 2013-11-19 DIAGNOSIS — E669 Obesity, unspecified: Secondary | ICD-10-CM

## 2013-11-19 DIAGNOSIS — M545 Low back pain, unspecified: Secondary | ICD-10-CM

## 2013-11-19 DIAGNOSIS — E785 Hyperlipidemia, unspecified: Secondary | ICD-10-CM

## 2013-11-19 DIAGNOSIS — M79604 Pain in right leg: Secondary | ICD-10-CM

## 2013-11-19 MED ORDER — PHENTERMINE HCL 15 MG PO CAPS: ORAL_CAPSULE | ORAL | Status: DC

## 2013-11-19 NOTE — Progress Notes (Signed)
Patient ID: Meagan Cox, female   DOB: August 26, 1940, 73 y.o.   MRN: [id number]   Patient Active Problem List   Diagnosis Date Noted  . Shoulder pain, right 11/22/2013  . Statin intolerance 10/08/2012  . Edema 05/14/2012  . Generalized anxiety disorder 01/06/2012  . 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 Hypothyroidism 12/18/2010  . Low back pain radiating to both legs 12/18/2010  . Hip pain, right 12/18/2010  . Obesity 05/09/2010  . SOB (shortness of breath) 05/09/2010  . CAD (coronary artery disease) 05/09/2010  . Hyperlipidemia 05/09/2010  . HTN (hypertension) 05/09/2010    Subjective:  CC:   Chief Complaint  Patient presents with  . Follow-up    6 month follow up    HPI:   Meagan Cox is a 73 y.o. female who presents for  Follow up on obesity, hyperlipidemia, hypothyroidism.  She had a  30 lb wt loss in the last year,  But has gained 7 lbs since last visit.   Diet reviewed.,  She has been drinking a protein shake prescribed by her daughter's bariatric surgeon  for her daughter who recently had bariatric surgery.   Not using phentermine currently tries to do wtihout it   Going to [place] in April on a river cruise. Wants to lose 24 lbs by then,   New cc of right arm pain.  She has been seeing Dr Laveda Abbe her chiropractor for chronic right arm pain which has been persistent , Laveda Abbe attributes it to prior neck injury and muscle strain during prior fall.   MRi of brain was ordered by [facility] for loss of taste .  Never got the results,  But review of [facility] at time of note completion notes mild to moderate white matter changes consistent with chronic small vessel disease , no masses  Normal sinuses, and normal cervicomedullary junction.    Past Medical History  Diagnosis Date  . Hypertension   . Hyperlipidemia   . Hypothyroidism     Past Surgical History  Procedure Laterality Date  . Cardiac catheterization      30 years ago, [place]  . Tonsillectomy     . Partial hysterectomy    . Abdominal hysterectomy         The following portions of the patient's history were reviewed and updated as appropriate: Allergies, current medications, and problem list.    Review of Systems:   Patient denies headache, fevers, malaise, unintentional weight loss, skin rash, eye pain, sinus congestion and sinus pain, sore throat, dysphagia,  hemoptysis , cough, dyspnea, wheezing, chest pain, palpitations, orthopnea, edema, abdominal pain, nausea, melena, diarrhea, constipation, flank pain, dysuria, hematuria, urinary  Frequency, nocturia, numbness, tingling, seizures,  Focal weakness, Loss of consciousness,  Tremor, insomnia, depression, anxiety, and suicidal ideation.     History   Social History  . Marital Status: Divorced    Spouse Name: N/A    Number of Children: N/A  . Years of Education: N/A   Occupational History  . Not on file.   Social History Main Topics  . Smoking status: Former Smoker -- 20 years    Types: Cigarettes    Quit date: 01/30/1988  . Smokeless tobacco: Never Used  . Alcohol Use: 2.4 oz/week    4 Glasses of wine per week     Comment: social/occasional  . Drug Use: No  . Sexual Activity: Not Currently   Other Topics Concern  . Not on file   Social History Narrative  .  No narrative on file    Objective:  Filed Vitals:   11/19/13 1108  BP: 120/88  Pulse: 57  Temp: 98 F (36.7 C)  Resp: 16     General appearance: alert, cooperative and appears stated age Ears: normal TM's and external ear canals both ears Throat: lips, mucosa, and tongue normal; teeth and gums normal Neck: no adenopathy, no carotid bruit, supple, symmetrical, trachea midline and thyroid not enlarged, symmetric, no tenderness/mass/nodules Back: symmetric, no curvature. ROM normal. No CVA tenderness. Lungs: clear to auscultation bilaterally Heart: regular rate and rhythm, S1, S2 normal, no murmur, click, rub or gallop Abdomen: soft,  non-tender; bowel sounds normal; no masses,  no organomegaly Pulses: 2+ and symmetric Skin: Skin color, texture, turgor normal. No rashes or lesions Lymph nodes: Cervical, supraclavicular, and axillary nodes normal.  Assessment and Plan:  HTN (hypertension) Well controlled on current regimen. Repeat assessment of real function is due , no changes today.  Lab Results  Component Value Date   NA 137 04/29/2013   K 4.3 04/29/2013   CL 102 04/29/2013   CO2 28 04/29/2013   Lab Results  Component Value Date   CREATININE 0.6 04/29/2013     Hypothyroidism Thyroid function is WNL on current dose.  No current changes needed.   Lab Results  Component Value Date   TSH 2.990 11/19/2013     Low back pain radiating to both legs MRI of lumbar spine in 2014 was entirely normal.  Weight loss and regular exercise advised.   Hyperlipidemia Untreated secondary to statin intolerance. She is overdue for fasting labs to assess her 10 yr risk of CHD .  Has chronic small vessel disease by recent brain MRI>   Lab Results  Component Value Date   CHOL 174 05/08/2011   HDL 69 05/08/2011   LDLCALC 63 05/08/2011   TRIG 210* 05/08/2011   CHOLHDL 2.5 05/08/2011     Obesity She has had difficulty losing weight due to increased appetite and is requesting to resume trial of  Phentermine.  She is aware of the possible side effects and risks and understands that    The medication will be discontinued if she has not lost 5% of her body weight over the next 3 months, which , based on today's weight is 11 lbs.  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  Option of cervical spine MRI is offered.    A total of 40 minutes was spent with patient more than half of which was spent in counseling patient on the above mentioned issues , reviewing and explaining recent labs and imaging studies done, and coordination of care.   Updated Medication  List Outpatient Encounter Prescriptions as of 11/19/2013  Medication Sig  . ALPRAZolam (XANAX) 0.5 MG tablet Take 1 tablet (0.5 mg total) by mouth at bedtime as needed.  Marland Kitchen aspirin 81 MG EC tablet Take 81 mg by mouth daily.    Marland Kitchen estradiol (ESTRACE) 2 MG tablet TAKE ONE TABLET BY MOUTH ONCE DAILY  . levothyroxine (SYNTHROID, LEVOTHROID) 125 MCG tablet TAKE ONE TABLET BY MOUTH EVERY DAY  . Multiple Vitamin (MULTIVITAMIN) tablet Take 1 tablet by mouth daily.    . Cyanocobalamin (B-12) 2500 MCG TABS Take by mouth.  . furosemide (LASIX) 20 MG tablet Take 1 tablet (20 mg total) by mouth daily as needed.  Marland Kitchen losartan-hydrochlorothiazide (HYZAAR) 100-25 MG per tablet Take 0.5 tablets by mouth daily.  Marland Kitchen  phentermine 15 MG capsule 2 times daily  . [DISCONTINUED] levothyroxine (SYNTHROID, LEVOTHROID) 137 MCG tablet Take 1 tablet (137 mcg total) by mouth daily.  . [DISCONTINUED] phentermine 15 MG capsule 2 times daily     Orders Placed This Encounter  Procedures  . DG Cervical Spine Complete  . T4 AND TSH  . Comp Met (CMET)    No Follow-up on file.

## 2013-11-19 NOTE — Progress Notes (Signed)
Pre-visit discussion using our clinic review tool. No additional management support is needed unless otherwise documented below in the visit note.  

## 2013-11-19 NOTE — Patient Instructions (Addendum)
I recommend resuming the phentermine once daily to help you lose the weight.  Your goal is 12 lbs in the next 3 months  Avoid the protein shakes that Santiago Glad is using,  They are causing weight gain   Try making "frittata"  (egg based dish without crust)  .  You can add sausage and sun dried tomatoes and it will satisfy your craving for pizza.   also try using the flat out and the [employer] pita breads for your pizza crust

## 2013-11-20 ENCOUNTER — Encounter: Payer: Self-pay | Admitting: Internal Medicine

## 2013-11-20 LAB — T4 AND TSH
T4, Total: 12.7 ug/dL — ABNORMAL HIGH (ref 4.5–12.0)
TSH: 2.99 u[IU]/mL (ref 0.450–4.500)

## 2013-11-22 ENCOUNTER — Encounter: Payer: Self-pay | Admitting: Internal Medicine

## 2013-11-22 DIAGNOSIS — M25511 Pain in right shoulder: Secondary | ICD-10-CM | POA: Insufficient documentation

## 2013-11-22 NOTE — Assessment & Plan Note (Addendum)
Thyroid function is WNL on current dose.  No current changes needed.   Lab Results  Component Value Date   TSH 2.990 11/19/2013

## 2013-11-22 NOTE — Assessment & Plan Note (Signed)
Untreated secondary to statin intolerance. She is overdue for fasting labs to assess her 10 yr risk of CHD .  Has chronic small vessel disease by recent brain MRI>   Lab Results  Component Value Date   CHOL 174 05/08/2011   HDL 69 05/08/2011   LDLCALC 63 05/08/2011   TRIG 210* 05/08/2011   CHOLHDL 2.5 05/08/2011

## 2013-11-22 NOTE — Assessment & Plan Note (Signed)
Well controlled on current regimen. Repeat assessment of real function is due , no changes today.  Lab Results  Component Value Date   NA 137 04/29/2013   K 4.3 04/29/2013   CL 102 04/29/2013   CO2 28 04/29/2013   Lab Results  Component Value Date   CREATININE 0.6 04/29/2013

## 2013-11-22 NOTE — Assessment & Plan Note (Signed)
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  Option of cervical spine MRI is offered.

## 2013-11-22 NOTE — Assessment & Plan Note (Addendum)
She has had difficulty losing weight due to increased appetite and is requesting to resume trial of  Phentermine.  She is aware of the possible side effects and risks and understands that    The medication will be discontinued if she has not lost 5% of her body weight over the next 3 months, which , based on today's weight is 11 lbs.

## 2013-11-22 NOTE — Assessment & Plan Note (Signed)
MRI of lumbar spine in 2014 was entirely normal.  Weight loss and regular exercise advised.

## 2013-11-25 ENCOUNTER — Telehealth: Payer: Self-pay | Admitting: *Deleted

## 2013-11-25 NOTE — Telephone Encounter (Signed)
This pt was sent over to our office on 10/22 for labs. Her tests were ordered for 2 separate labs, but [facility] couldn't locate her specimen. I called [facility] to have to other tests added, but just received a call that there wasn't sufficient quantity of specimen to perform the cmet, and lipid, so she will need to be redrawn.

## 2013-11-26 NOTE — Telephone Encounter (Signed)
Sent mychart

## 2013-11-26 NOTE — Telephone Encounter (Signed)
Please call patient and ask her to return for the additional labs .  I sent it you to you since it's not clear what they collected etc.

## 2013-11-26 NOTE — Telephone Encounter (Signed)
Tried calling home number (saying its no longer in service) can you be able to send her a mychart message to come back for a re-draw

## 2013-12-15 ENCOUNTER — Other Ambulatory Visit: Payer: Self-pay | Admitting: Internal Medicine

## 2013-12-16 LAB — COMPREHENSIVE METABOLIC PANEL WITH GFR

## 2013-12-16 LAB — LIPID PANEL W/O CHOL/HDL RATIO

## 2013-12-16 LAB — SPECIMEN STATUS REPORT

## 2014-03-31 ENCOUNTER — Ambulatory Visit (INDEPENDENT_AMBULATORY_CARE_PROVIDER_SITE_OTHER): Payer: PPO | Admitting: Internal Medicine

## 2014-03-31 ENCOUNTER — Encounter: Payer: Self-pay | Admitting: Internal Medicine

## 2014-03-31 VITALS — BP 118/78 | HR 64 | Temp 97.4°F | Resp 16 | Ht 62.0 in | Wt 215.5 lb

## 2014-03-31 DIAGNOSIS — Z79899 Other long term (current) drug therapy: Secondary | ICD-10-CM

## 2014-03-31 DIAGNOSIS — I10 Essential (primary) hypertension: Secondary | ICD-10-CM

## 2014-03-31 DIAGNOSIS — Z889 Allergy status to unspecified drugs, medicaments and biological substances status: Secondary | ICD-10-CM

## 2014-03-31 DIAGNOSIS — M25551 Pain in right hip: Secondary | ICD-10-CM

## 2014-03-31 DIAGNOSIS — E034 Atrophy of thyroid (acquired): Secondary | ICD-10-CM

## 2014-03-31 DIAGNOSIS — M545 Low back pain, unspecified: Secondary | ICD-10-CM

## 2014-03-31 DIAGNOSIS — E669 Obesity, unspecified: Secondary | ICD-10-CM

## 2014-03-31 DIAGNOSIS — E785 Hyperlipidemia, unspecified: Secondary | ICD-10-CM

## 2014-03-31 DIAGNOSIS — Z789 Other specified health status: Secondary | ICD-10-CM

## 2014-03-31 DIAGNOSIS — E038 Other specified hypothyroidism: Secondary | ICD-10-CM

## 2014-03-31 MED ORDER — SCOPOLAMINE 1 MG/3DAYS TD PT72: 1.0000 | MEDICATED_PATCH | TRANSDERMAL | Status: DC

## 2014-03-31 MED ORDER — ESTRADIOL 2 MG PO TABS: ORAL_TABLET | ORAL | Status: DC

## 2014-03-31 MED ORDER — DICLOFENAC POTASSIUM 50 MG PO TABS: 50.0000 mg | ORAL_TABLET | Freq: Two times a day (BID) | ORAL | Status: DC

## 2014-03-31 MED ORDER — LEVOFLOXACIN 500 MG PO TABS: 500.0000 mg | ORAL_TABLET | Freq: Every day | ORAL | Status: DC

## 2014-03-31 NOTE — Progress Notes (Signed)
Pre-visit discussion using our clinic review tool. No additional management support is needed unless otherwise documented below in the visit note.  

## 2014-03-31 NOTE — Patient Instructions (Addendum)
I will increase your thyroid dose if there is room based on your labs from today  i sent the motion sickness patch and an antibitoic called levaquin to your pharmacy  Take them with you on your trip for seasickness .  The levaqui nwill treat sinus infections and urinary tract infections

## 2014-03-31 NOTE — Progress Notes (Signed)
Patient ID: Meagan Cox, female   DOB: 03-14-1940, 74 y.o.   MRN: [id number]   Patient Active Problem List   Diagnosis Date Noted  . Shoulder pain, right 11/22/2013  . Statin intolerance 10/08/2012  . Edema 05/14/2012  . Generalized anxiety disorder 01/06/2012  . 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 Hypothyroidism 12/18/2010  . Low back pain radiating to both legs 12/18/2010  . Hip pain, right 12/18/2010  . Obesity 05/09/2010  . SOB (shortness of breath) 05/09/2010  . CAD (coronary artery disease) 05/09/2010  . Hyperlipidemia 05/09/2010  . HTN (hypertension) 05/09/2010    Subjective:  CC:   Chief Complaint  Patient presents with  . Follow-up    weight    HPI:   Meagan Cox is a 74 y.o. female who presents for   Follow up on Obesity.  She has had difficulty losing weight since the phentermine was stopped due to her age.  She was tolerating the medication without side effects .  Since the medication has stopped her appetite has increased and her diet has become too  liberal .  She is not  exercising due to persistent orthopedic complaints:   right leg pain  oif unclear etiology despite evaluation by multiple specialists.  Workup thus far has included plain films of hip and lower spine .  The pain is relieved with diclofenac and instaflec (turmeric) .   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 Hypertension   . Hyperlipidemia   . Hypothyroidism     Past Surgical History  Procedure Laterality Date  . Cardiac catheterization      30 years ago, [place]  . Tonsillectomy    . Partial hysterectomy    . Abdominal hysterectomy         The following portions of the patient's history were reviewed and updated as appropriate: Allergies, current medications, and problem list.    Review of Systems:   Patient denies headache, fevers,  malaise, unintentional weight loss, skin rash, eye pain, sinus congestion and sinus pain, sore throat, dysphagia,  hemoptysis , cough, dyspnea, wheezing, chest pain, palpitations, orthopnea, edema, abdominal pain, nausea, melena, diarrhea, constipation, flank pain, dysuria, hematuria, urinary  Frequency, nocturia, numbness, tingling, seizures,  Focal weakness, Loss of consciousness,  Tremor, insomnia, depression, anxiety, and suicidal ideation.     History   Social History  . Marital Status: Divorced    Spouse Name: N/A  . Number of Children: N/A  . Years of Education: N/A   Occupational History  . Not on file.   Social History Main Topics  . Smoking status: Former Smoker -- 20 years    Types: Cigarettes    Quit date: 01/30/1988  . Smokeless tobacco: Never Used  . Alcohol Use: 2.4 oz/week    4 Glasses of wine per week     Comment: social/occasional  . Drug Use: No  . Sexual Activity: Not Currently   Other Topics Concern  . Not on file   Social History Narrative    Objective:  Filed Vitals:   03/31/14 1449  BP: 118/78  Pulse: 64  Temp: 97.4 F (36.3 C)  Resp: 16     General appearance: alert, cooperative and appears stated age Ears: normal TM's and external ear canals both ears Throat: lips, mucosa, and tongue normal; teeth and gums normal  Neck: no adenopathy, no carotid bruit, supple, symmetrical, trachea midline and thyroid not enlarged, symmetric, no tenderness/mass/nodules Back: symmetric, no curvature. ROM normal. No CVA tenderness. Lungs: clear to auscultation bilaterally Heart: regular rate and rhythm, S1, S2 normal, no murmur, click, rub or gallop Abdomen: soft, non-tender; bowel sounds normal; no masses,  no organomegaly Pulses: 2+ and symmetric Skin: Skin color, texture, turgor normal. No rashes or lesions Lymph nodes: Cervical, supraclavicular, and axillary nodes normal.  Assessment and Plan:  Hip pain, right Degenerative changes by prior workup by  Orthopedics and Rheumatology.  She has deferred steroid injections  In the past due to history of  Side effects.    Low back pain radiating to both legs Etiology unclear, since lumar spine MRI ordered by [facility] was normal.  Continue diclofenac   Hypothyroidism Thyroid function is WNL on current dose.  No current changes needed.   Lab Results  Component Value Date   TSH 1.83 03/31/2014      HTN (hypertension) Well controlled on current regimen. Renal function stable, no changes today.  Lab Results  Component Value Date   CREATININE 0.69 03/31/2014      Obesity She has lost a total of 32 lbs since Sept 2014 but has had difficulty losing weight due to increased appetite since Phentermine was stopped due to her age .  She is not exercising at all however, due to orthopedic complaints. But she has not tried participating in  water aerobics  .  trial of orlistat. ; if not tolerated,  belviq.    Hyperlipidemia Untreated secondary to statin intolerance. She is overdue for fasting labs to assess her 10 yr risk of CHD .  Has chronic small vessel disease by recent brain MRI>         A total of 25 minutes of face to face time was spent with patient more than half of which was spent in counselling on the above mentioned issues.  Updated Medication List Outpatient Encounter Prescriptions as of 03/31/2014  Medication Sig  . ALPRAZolam (XANAX) 0.5 MG tablet Take 1 tablet (0.5 mg total) by mouth at bedtime as needed.  Marland Kitchen aspirin 81 MG EC tablet Take 81 mg by mouth daily.    . Cyanocobalamin (B-12) 2500 MCG TABS Take by mouth.  . estradiol (ESTRACE) 2 MG tablet TAKE ONE TABLET BY MOUTH ONCE DAILY  . furosemide (LASIX) 20 MG tablet Take 1 tablet (20 mg total) by mouth daily as needed.  Marland Kitchen levothyroxine (SYNTHROID, LEVOTHROID) 125 MCG tablet TAKE ONE TABLET BY MOUTH EVERY DAY  . losartan-hydrochlorothiazide (HYZAAR) 100-25 MG per tablet Take 0.5 tablets by mouth daily.  . Multiple  Vitamin (MULTIVITAMIN) tablet Take 1 tablet by mouth daily.    . [DISCONTINUED] estradiol (ESTRACE) 2 MG tablet TAKE ONE TABLET BY MOUTH ONCE DAILY  . [DISCONTINUED] phentermine 15 MG capsule 2 times daily  . diclofenac (CATAFLAM) 50 MG tablet Take 1 tablet (50 mg total) by mouth 2 (two) times daily.  Marland Kitchen levofloxacin (LEVAQUIN) 500 MG tablet Take 1 tablet (500 mg total) by mouth daily.  Marland Kitchen orlistat (XENICAL) 120 MG capsule Take 1 capsule (120 mg total) by mouth 3 (three) times daily with meals.  Marland Kitchen scopolamine (TRANSDERM-SCOP) 1 MG/3DAYS Place 1 patch (1.5 mg total) onto the skin every 3 (three) days.     Orders Placed This Encounter  Procedures  . TSH  . Comp Met (CMET)    No Follow-up on file.

## 2014-04-01 LAB — COMPREHENSIVE METABOLIC PANEL
ALK PHOS: 63 U/L (ref 39–117)
ALT: 11 U/L (ref 0–35)
AST: 19 U/L (ref 0–37)
Albumin: 3.6 g/dL (ref 3.5–5.2)
BUN: 16 mg/dL (ref 6–23)
CALCIUM: 8.9 mg/dL (ref 8.4–10.5)
CHLORIDE: 103 meq/L (ref 96–112)
CO2: 26 mEq/L (ref 19–32)
Creatinine, Ser: 0.69 mg/dL (ref 0.40–1.20)
GFR: 88.85 mL/min (ref >60.00)
Glucose, Bld: 89 mg/dL (ref 70–99)
POTASSIUM: 4.1 meq/L (ref 3.5–5.1)
Sodium: 138 mEq/L (ref 135–145)
TOTAL PROTEIN: 6.2 g/dL (ref 6.0–8.3)
Total Bilirubin: 0.3 mg/dL (ref 0.2–1.2)

## 2014-04-01 LAB — TSH: TSH: 1.83 u[IU]/mL (ref 0.35–4.50)

## 2014-04-03 ENCOUNTER — Encounter: Payer: Self-pay | Admitting: Internal Medicine

## 2014-04-03 MED ORDER — ORLISTAT 120 MG PO CAPS: 120.0000 mg | ORAL_CAPSULE | Freq: Three times a day (TID) | ORAL | Status: DC

## 2014-04-03 NOTE — Assessment & Plan Note (Addendum)
Degenerative changes by prior workup by Orthopedics and Rheumatology.  She has deferred steroid injections  In the past due to history of  Side effects.

## 2014-04-03 NOTE — Assessment & Plan Note (Signed)
Thyroid function is WNL on current dose.  No current changes needed.   Lab Results  Component Value Date   TSH 1.83 03/31/2014

## 2014-04-03 NOTE — Assessment & Plan Note (Addendum)
She has lost a total of 32 lbs since Sept 2014 but has had difficulty losing weight due to increased appetite since Phentermine was stopped due to her age .  She is not exercising at all however, due to orthopedic complaints. But she has not tried participating in  water aerobics  .  trial of orlistat. ; if not tolerated,  belviq.

## 2014-04-03 NOTE — Assessment & Plan Note (Signed)
Etiology unclear, since lumar spine MRI ordered by [facility] was normal.  Continue diclofenac

## 2014-04-03 NOTE — Assessment & Plan Note (Signed)
Untreated secondary to statin intolerance. She is overdue for fasting labs to assess her 10 yr risk of CHD .  Has chronic small vessel disease by recent brain MRI>

## 2014-04-03 NOTE — Assessment & Plan Note (Signed)
Well controlled on current regimen. Renal function stable, no changes today.  Lab Results  Component Value Date   CREATININE 0.69 03/31/2014

## 2014-04-05 ENCOUNTER — Telehealth: Payer: Self-pay | Admitting: *Deleted

## 2014-04-05 NOTE — Telephone Encounter (Signed)
Fax from [facility], needing PA for Xenical. Started online and approved.

## 2014-04-05 NOTE — Telephone Encounter (Signed)
Request has been approved effective 03/06/14 through 07/04/14.

## 2014-07-26 ENCOUNTER — Other Ambulatory Visit: Payer: Self-pay

## 2014-08-11 ENCOUNTER — Other Ambulatory Visit: Payer: Self-pay | Admitting: Internal Medicine

## 2014-11-04 ENCOUNTER — Other Ambulatory Visit: Payer: Self-pay | Admitting: Internal Medicine

## 2014-11-10 ENCOUNTER — Encounter: Payer: Self-pay | Admitting: Internal Medicine

## 2014-11-17 ENCOUNTER — Ambulatory Visit: Payer: Medicare Other

## 2015-01-07 ENCOUNTER — Ambulatory Visit (INDEPENDENT_AMBULATORY_CARE_PROVIDER_SITE_OTHER): Payer: PPO

## 2015-01-07 VITALS — BP 122/76 | HR 77 | Temp 97.1°F | Resp 14 | Ht 64.0 in

## 2015-01-07 DIAGNOSIS — Z0000 Encounter for general adult medical examination without abnormal findings: Secondary | ICD-10-CM | POA: Diagnosis not present

## 2015-01-07 NOTE — Progress Notes (Signed)
Subjective:   Meagan Cox is a 74 y.o. female who presents for an Initial Medicare Annual Wellness Visit.  Review of Systems    No ROS.  Medicare Wellness Visit.  Cardiac Risk Factors include: advanced age (>87men, >36 women);hypertension     Objective:    Today's Vitals   01/07/15 1347  BP: 122/76  Pulse: 77  Temp: 97.1 F (36.2 C)  TempSrc: Oral  Resp: 14  Height: 5\' 4"  (1.626 m)  SpO2: 98%    Current Medications (verified) Outpatient Encounter Prescriptions as of 01/07/2015  Medication Sig  . ALPRAZolam (XANAX) 0.5 MG tablet Take 1 tablet (0.5 mg total) by mouth at bedtime as needed.  . Cyanocobalamin (B-12) 2500 MCG TABS Take by mouth.  . diclofenac (CATAFLAM) 50 MG tablet Take 1 tablet (50 mg total) by mouth 2 (two) times daily.  Marland Kitchen estradiol (ESTRACE) 2 MG tablet TAKE ONE TABLET BY MOUTH ONCE DAILY  . levothyroxine (SYNTHROID, LEVOTHROID) 125 MCG tablet TAKE ONE TABLET BY MOUTH ONCE DAILY  . Multiple Vitamin (MULTIVITAMIN) tablet Take 1 tablet by mouth daily.    . [DISCONTINUED] aspirin 81 MG EC tablet Take 81 mg by mouth daily.    . [DISCONTINUED] furosemide (LASIX) 20 MG tablet Take 1 tablet (20 mg total) by mouth daily as needed.  . [DISCONTINUED] levofloxacin (LEVAQUIN) 500 MG tablet Take 1 tablet (500 mg total) by mouth daily.  . [DISCONTINUED] losartan-hydrochlorothiazide (HYZAAR) 100-25 MG per tablet Take 0.5 tablets by mouth daily.  . [DISCONTINUED] orlistat (XENICAL) 120 MG capsule Take 1 capsule (120 mg total) by mouth 3 (three) times daily with meals.  . [DISCONTINUED] scopolamine (TRANSDERM-SCOP) 1 MG/3DAYS Place 1 patch (1.5 mg total) onto the skin every 3 (three) days.   No facility-administered encounter medications on file as of 01/07/2015.    Allergies (verified) Predicort and Penicillins   History: Past Medical History  Diagnosis Date  . Hypertension   . Hyperlipidemia   . Hypothyroidism    Past Surgical History  Procedure  Laterality Date  . Cardiac catheterization      30 years ago, [place]  . Tonsillectomy    . Partial hysterectomy    . Abdominal hysterectomy     Family History  Problem Relation Age of Onset  . Heart disease Mother     heart problems  . Hypothyroidism Mother   . Cancer Father     lymphoma   Social History   Occupational History  . Not on file.   Social History Main Topics  . Smoking status: Former Smoker -- 20 years    Types: Cigarettes    Quit date: 01/30/1988  . Smokeless tobacco: Never Used  . Alcohol Use: 2.4 oz/week    4 Glasses of wine per week     Comment: social/occasional  . Drug Use: No  . Sexual Activity: Not Currently    Tobacco Counseling Counseling given: Not Answered   Activities of Daily Living In your present state of health, do you have any difficulty performing the following activities: 01/07/2015  Hearing? N  Vision? N  Difficulty concentrating or making decisions? N  Walking or climbing stairs? N  Dressing or bathing? N  Doing errands, shopping? N  Preparing Food and eating ? N  Using the Toilet? N  In the past six months, have you accidently leaked urine? N  Do you have problems with loss of bowel control? N  Managing your Medications? N  Managing your Finances? N  Housekeeping  or managing your Housekeeping? N    Immunizations and Health Maintenance Immunization History  Administered Date(s) Administered  . Influenza Split 02/09/2011  . Influenza,inj,Quad PF,36+ Mos 10/08/2012, 11/19/2013  . Pneumococcal Polysaccharide-23 11/04/2012  . Zoster 02/13/2013   Health Maintenance Due  Topic Date Due  . TETANUS/TDAP  02/02/1961  . COLONOSCOPY  02/03/1992  . DEXA SCAN  02/03/2007  . MAMMOGRAM  04/09/2013  . PNA vac Low Risk Adult (2 of 2 - PCV13) 11/04/2013    Patient Care Team: Crecencio Mc, MD as PCP - General (Internal Medicine)  Indicate any recent Medical Services you may have received from other than [facility] providers in  the past year (date may be approximate).     Assessment:   This is a routine wellness examination for Meagan Cox.   The goal of the wellness visit is to assist the patient how to close the gaps in care and create a preventative care plan for the patient.   Bone Density/Risk for Osteoporosis discussed/reviewed.    Taking meds without issues; no barriers identified.  Safety issues reviewed; smoke detectors in the home. No firearms in the home. Wears seatbelts when driving or riding with others. No violence in the home.  The patient was oriented x 3; appropriate in dress and manner and no objective failures at ADL's or IADL's.    PVC13 postponed, per patient request to receive at upcoming follow up.  Overdue Health Maintenance:  TDAP, Colonoscopy/Cologuard, Dexa Scan, Mammogram, declined per patient request.  Patient Concerns: XANAX refill and cough with sinus pressure; deferred to PCP for follow up, appointment scheduled.   Hearing/Vision screen Hearing Screening Comments: Passes the whisper test  Vision Screening Comments: Followed by Suzie Portela, Dr. Isaias Sakai Annual visits Wears glasses   Dietary issues and exercise activities discussed: Current Exercise Habits:: Structured exercise class, Type of exercise: Other - see comments (Water aerobics twice weekly), Time (Minutes): 60, Intensity: Mild  Goals    . Increase physical activity     Walk a minimum of 30 minutes with daughter around the neighborhood as often as possible, as tolerated.      Depression Screen PHQ 2/9 Scores 01/07/2015 11/19/2013 01/04/2012  PHQ - 2 Score 0 0 0    Fall Risk Fall Risk  01/07/2015 11/19/2013 01/04/2012  Falls in the past year? No Yes No  Risk for fall due to : - Impaired balance/gait -    Cognitive Function: MMSE - Mini Mental State Exam 01/07/2015  Orientation to time 5  Orientation to Place 5  Registration 3  Attention/ Calculation 5  Recall 3  Language- name 2 objects 2  Language- repeat 1    Language- follow 3 step command 3  Language- read & follow direction 1  Write a sentence 1  Copy design 1  Total score 30    Screening Tests Health Maintenance  Topic Date Due  . TETANUS/TDAP  02/02/1961  . COLONOSCOPY  02/03/1992  . DEXA SCAN  02/03/2007  . MAMMOGRAM  04/09/2013  . PNA vac Low Risk Adult (2 of 2 - PCV13) 11/04/2013  . INFLUENZA VACCINE  08/30/2015  . ZOSTAVAX  Completed      Plan:   End of life planning; Advance aging;Advanced directives.  Copy requested of current copy.  Follow up with PCP as needed.  During the course of the visit, Feigy was educated and counseled about the following appropriate screening and preventive services:   Vaccines to include Pneumoccal, Influenza, Hepatitis B, Td, Zostavax, HCV  Electrocardiogram  Cardiovascular disease screening  Colorectal cancer screening  Bone density screening  Diabetes screening  Glaucoma screening  Mammography/PAP  Nutrition counseling  Smoking cessation counseling  Patient Instructions (the written plan) were given to the patient.    Varney Biles, LPN   [id number]

## 2015-01-07 NOTE — Patient Instructions (Addendum)
Ms. Meagan Cox,  Thank you for taking time to come for your Medicare Wellness Visit.  I appreciate your ongoing commitment to your health goals. Please review the following plan we discussed and let me know if I can assist you in the future.  Happy Holidays!  Return on Monday for follow up with Dr. Derrel Nip  Health Maintenance, Female Adopting a healthy lifestyle and getting preventive care can go a long way to promote health and wellness. Talk with your health care provider about what schedule of regular examinations is right for you. This is a good chance for you to check in with your provider about disease prevention and staying healthy. In between checkups, there are plenty of things you can do on your own. Experts have done a lot of research about which lifestyle changes and preventive measures are most likely to keep you healthy. Ask your health care provider for more information. WEIGHT AND DIET  Eat a healthy diet  Be sure to include plenty of vegetables, fruits, low-fat dairy products, and lean protein.  Do not eat a lot of foods high in solid fats, added sugars, or salt.  Get regular exercise. This is one of the most important things you can do for your health.  Most adults should exercise for at least 150 minutes each week. The exercise should increase your heart rate and make you sweat (moderate-intensity exercise).  Most adults should also do strengthening exercises at least twice a week. This is in addition to the moderate-intensity exercise.  Maintain a healthy weight  Body mass index (BMI) is a measurement that can be used to identify possible weight problems. It estimates body fat based on height and weight. Your health care provider can help determine your BMI and help you achieve or maintain a healthy weight.  For females 72 years of age and older:   A BMI below 18.5 is considered underweight.  A BMI of 18.5 to 24.9 is normal.  A BMI of 25 to 29.9 is considered  overweight.  A BMI of 30 and above is considered obese.  Watch levels of cholesterol and blood lipids  You should start having your blood tested for lipids and cholesterol at 72 years of age, then have this test every 5 years.  You may need to have your cholesterol levels checked more often if:  Your lipid or cholesterol levels are high.  You are older than 72 years of age.  You are at high risk for heart disease.  CANCER SCREENING   Lung Cancer  Lung cancer screening is recommended for adults 59-63 years old who are at high risk for lung cancer because of a history of smoking.  A yearly low-dose CT scan of the lungs is recommended for people who:  Currently smoke.  Have quit within the past 15 years.  Have at least a 30-pack-year history of smoking. A pack year is smoking an average of one pack of cigarettes a day for 1 year.  Yearly screening should continue until it has been 15 years since you quit.  Yearly screening should stop if you develop a health problem that would prevent you from having lung cancer treatment.  Breast Cancer  Practice breast self-awareness. This means understanding how your breasts normally appear and feel.  It also means doing regular breast self-exams. Let your health care provider know about any changes, no matter how small.  If you are in your 20s or 30s, you should have a clinical breast exam (CBE)  by a health care provider every 1-3 years as part of a regular health exam.  If you are 38 or older, have a CBE every year. Also consider having a breast X-ray (mammogram) every year.  If you have a family history of breast cancer, talk to your health care provider about genetic screening.  If you are at high risk for breast cancer, talk to your health care provider about having an MRI and a mammogram every year.  Breast cancer gene (BRCA) assessment is recommended for women who have family members with BRCA-related cancers. BRCA-related  cancers include:  Breast.  Ovarian.  Tubal.  Peritoneal cancers.  Results of the assessment will determine the need for genetic counseling and BRCA1 and BRCA2 testing. Cervical Cancer Your health care provider may recommend that you be screened regularly for cancer of the pelvic organs (ovaries, uterus, and vagina). This screening involves a pelvic examination, including checking for microscopic changes to the surface of your cervix (Pap test). You may be encouraged to have this screening done every 3 years, beginning at age 30.  For women ages 54-65, health care providers may recommend pelvic exams and Pap testing every 3 years, or they may recommend the Pap and pelvic exam, combined with testing for human papilloma virus (HPV), every 5 years. Some types of HPV increase your risk of cervical cancer. Testing for HPV may also be done on women of any age with unclear Pap test results.  Other health care providers may not recommend any screening for nonpregnant women who are considered low risk for pelvic cancer and who do not have symptoms. Ask your health care provider if a screening pelvic exam is right for you.  If you have had past treatment for cervical cancer or a condition that could lead to cancer, you need Pap tests and screening for cancer for at least 20 years after your treatment. If Pap tests have been discontinued, your risk factors (such as having a new sexual partner) need to be reassessed to determine if screening should resume. Some women have medical problems that increase the chance of getting cervical cancer. In these cases, your health care provider may recommend more frequent screening and Pap tests. Colorectal Cancer  This type of cancer can be detected and often prevented.  Routine colorectal cancer screening usually begins at 72 years of age and continues through 72 years of age.  Your health care provider may recommend screening at an earlier age if you have risk  factors for colon cancer.  Your health care provider may also recommend using home test kits to check for hidden blood in the stool.  A small camera at the end of a tube can be used to examine your colon directly (sigmoidoscopy or colonoscopy). This is done to check for the earliest forms of colorectal cancer.  Routine screening usually begins at age 72.  Direct examination of the colon should be repeated every 5-10 years through 72 years of age. However, you may need to be screened more often if early forms of precancerous polyps or small growths are found. Skin Cancer  Check your skin from head to toe regularly.  Tell your health care provider about any new moles or changes in moles, especially if there is a change in a mole's shape or color.  Also tell your health care provider if you have a mole that is larger than the size of a pencil eraser.  Always use sunscreen. Apply sunscreen liberally and repeatedly throughout the  day.  Protect yourself by wearing long sleeves, pants, a wide-brimmed hat, and sunglasses whenever you are outside. HEART DISEASE, DIABETES, AND HIGH BLOOD PRESSURE   High blood pressure causes heart disease and increases the risk of stroke. High blood pressure is more likely to develop in:  People who have blood pressure in the high end of the normal range (130-139/85-89 mm Hg).  People who are overweight or obese.  People who are African American.  If you are 51-35 years of age, have your blood pressure checked every 3-5 years. If you are 59 years of age or older, have your blood pressure checked every year. You should have your blood pressure measured twice--once when you are at a hospital or clinic, and once when you are not at a hospital or clinic. Record the average of the two measurements. To check your blood pressure when you are not at a hospital or clinic, you can use:  An automated blood pressure machine at a pharmacy.  A home blood pressure  monitor.  If you are between 82 years and 40 years old, ask your health care provider if you should take aspirin to prevent strokes.  Have regular diabetes screenings. This involves taking a blood sample to check your fasting blood sugar level.  If you are at a normal weight and have a low risk for diabetes, have this test once every three years after 72 years of age.  If you are overweight and have a high risk for diabetes, consider being tested at a younger age or more often. PREVENTING INFECTION  Hepatitis B  If you have a higher risk for hepatitis B, you should be screened for this virus. You are considered at high risk for hepatitis B if:  You were born in a country where hepatitis B is common. Ask your health care provider which countries are considered high risk.  Your parents were born in a high-risk country, and you have not been immunized against hepatitis B (hepatitis B vaccine).  You have HIV or AIDS.  You use needles to inject street drugs.  You live with someone who has hepatitis B.  You have had sex with someone who has hepatitis B.  You get hemodialysis treatment.  You take certain medicines for conditions, including cancer, organ transplantation, and autoimmune conditions. Hepatitis C  Blood testing is recommended for:  Everyone born from [birth year] through 1963.  Anyone with known risk factors for hepatitis C. Sexually transmitted infections (STIs)  You should be screened for sexually transmitted infections (STIs) including gonorrhea and chlamydia if:  You are sexually active and are younger than 72 years of age.  You are older than 72 years of age and your health care provider tells you that you are at risk for this type of infection.  Your sexual activity has changed since you were last screened and you are at an increased risk for chlamydia or gonorrhea. Ask your health care provider if you are at risk.  If you do not have HIV, but are at risk, it may be  recommended that you take a prescription medicine daily to prevent HIV infection. This is called pre-exposure prophylaxis (PrEP). You are considered at risk if:  You are sexually active and do not regularly use condoms or know the HIV status of your partner(s).  You take drugs by injection.  You are sexually active with a partner who has HIV. Talk with your health care provider about whether you are at high risk of  being infected with HIV. If you choose to begin PrEP, you should first be tested for HIV. You should then be tested every 3 months for as long as you are taking PrEP.  PREGNANCY   If you are premenopausal and you may become pregnant, ask your health care provider about preconception counseling.  If you may become pregnant, take 400 to 800 micrograms (mcg) of folic acid every day.  If you want to prevent pregnancy, talk to your health care provider about birth control (contraception). OSTEOPOROSIS AND MENOPAUSE   Osteoporosis is a disease in which the bones lose minerals and strength with aging. This can result in serious bone fractures. Your risk for osteoporosis can be identified using a bone density scan.  If you are 73 years of age or older, or if you are at risk for osteoporosis and fractures, ask your health care provider if you should be screened.  Ask your health care provider whether you should take a calcium or vitamin D supplement to lower your risk for osteoporosis.  Menopause may have certain physical symptoms and risks.  Hormone replacement therapy may reduce some of these symptoms and risks. Talk to your health care provider about whether hormone replacement therapy is right for you.  HOME CARE INSTRUCTIONS   Schedule regular health, dental, and eye exams.  Stay current with your immunizations.   Do not use any tobacco products including cigarettes, chewing tobacco, or electronic cigarettes.  If you are pregnant, do not drink alcohol.  If you are  breastfeeding, limit how much and how often you drink alcohol.  Limit alcohol intake to no more than 1 drink per day for nonpregnant women. One drink equals 12 ounces of beer, 5 ounces of wine, or 1 ounces of hard liquor.  Do not use street drugs.  Do not share needles.  Ask your health care provider for help if you need support or information about quitting drugs.  Tell your health care provider if you often feel depressed.  Tell your health care provider if you have ever been abused or do not feel safe at home.   This information is not intended to replace advice given to you by your health care provider. Make sure you discuss any questions you have with your health care provider.   Document Released: 07/31/2010 Document Revised: 02/05/2014 Document Reviewed: 12/17/2012 Elsevier Interactive Patient Education [employer].

## 2015-01-10 ENCOUNTER — Encounter: Payer: Self-pay | Admitting: Internal Medicine

## 2015-01-10 ENCOUNTER — Ambulatory Visit (INDEPENDENT_AMBULATORY_CARE_PROVIDER_SITE_OTHER): Payer: PPO | Admitting: Internal Medicine

## 2015-01-10 VITALS — BP 118/78 | HR 69 | Temp 97.6°F | Resp 12 | Ht 64.0 in | Wt 217.0 lb

## 2015-01-10 DIAGNOSIS — Z7184 Encounter for health counseling related to travel: Secondary | ICD-10-CM

## 2015-01-10 DIAGNOSIS — E038 Other specified hypothyroidism: Secondary | ICD-10-CM | POA: Diagnosis not present

## 2015-01-10 DIAGNOSIS — I10 Essential (primary) hypertension: Secondary | ICD-10-CM

## 2015-01-10 DIAGNOSIS — E559 Vitamin D deficiency, unspecified: Secondary | ICD-10-CM | POA: Diagnosis not present

## 2015-01-10 DIAGNOSIS — Z23 Encounter for immunization: Secondary | ICD-10-CM | POA: Diagnosis not present

## 2015-01-10 DIAGNOSIS — Z7189 Other specified counseling: Secondary | ICD-10-CM

## 2015-01-10 DIAGNOSIS — Z79899 Other long term (current) drug therapy: Secondary | ICD-10-CM | POA: Diagnosis not present

## 2015-01-10 DIAGNOSIS — E034 Atrophy of thyroid (acquired): Secondary | ICD-10-CM

## 2015-01-10 DIAGNOSIS — F419 Anxiety disorder, unspecified: Secondary | ICD-10-CM

## 2015-01-10 DIAGNOSIS — E669 Obesity, unspecified: Secondary | ICD-10-CM

## 2015-01-10 LAB — COMPREHENSIVE METABOLIC PANEL
ALT: 9 U/L (ref 0–35)
AST: 14 U/L (ref 0–37)
Albumin: 3.9 g/dL (ref 3.5–5.2)
Alkaline Phosphatase: 66 U/L (ref 39–117)
BUN: 13 mg/dL (ref 6–23)
CHLORIDE: 103 meq/L (ref 96–112)
CO2: 28 meq/L (ref 19–32)
CREATININE: 0.64 mg/dL (ref 0.40–1.20)
Calcium: 8.9 mg/dL (ref 8.4–10.5)
GFR: 96.7 mL/min (ref >60.00)
Glucose, Bld: 86 mg/dL (ref 70–99)
Potassium: 4.2 mEq/L (ref 3.5–5.1)
Sodium: 138 mEq/L (ref 135–145)
Total Bilirubin: 0.4 mg/dL (ref 0.2–1.2)
Total Protein: 6.5 g/dL (ref 6.0–8.3)

## 2015-01-10 LAB — VITAMIN D 25 HYDROXY (VIT D DEFICIENCY, FRACTURES): VITD: 24.84 ng/mL — ABNORMAL LOW (ref 30.00–100.00)

## 2015-01-10 LAB — TSH: TSH: 2.83 u[IU]/mL (ref 0.35–4.50)

## 2015-01-10 MED ORDER — BENZONATATE 100 MG PO CAPS: 200.0000 mg | ORAL_CAPSULE | Freq: Two times a day (BID) | ORAL | Status: DC | PRN

## 2015-01-10 MED ORDER — IVERMECTIN 1 % EX CREA: 1.0000 "application " | TOPICAL_CREAM | Freq: Every day | CUTANEOUS | Status: DC

## 2015-01-10 MED ORDER — ALPRAZOLAM 0.5 MG PO TABS: 0.5000 mg | ORAL_TABLET | Freq: Every evening | ORAL | Status: DC | PRN

## 2015-01-10 MED ORDER — LEVOFLOXACIN 500 MG PO TABS: 500.0000 mg | ORAL_TABLET | Freq: Every day | ORAL | Status: DC

## 2015-01-10 MED ORDER — ONDANSETRON HCL 4 MG PO TABS: 4.0000 mg | ORAL_TABLET | Freq: Three times a day (TID) | ORAL | Status: DC | PRN

## 2015-01-10 NOTE — Progress Notes (Signed)
Pre-visit discussion using our clinic review tool. No additional management support is needed unless otherwise documented below in the visit note.  

## 2015-01-10 NOTE — Patient Instructions (Addendum)
Take the Levaquin with y.  Can  Please take a probiotic ( Align, Floraque or Culturelle) of the generic version of one of these  For a minimum of 3 weeks to prevent a serious antibiotic associated diarrhea  Called clostridium dificile colitis   Generic zofran for nausea and vomiting   Sudafed PE 10 mg up to 30 mg every 6 hours as needed for sinus headache.  You  can combine with tylenol or diclofenac   TEXT ME A SELFIE ON NEW YEARS EVE!  [id number]

## 2015-01-10 NOTE — Progress Notes (Signed)
Subjective:  Patient ID: Meagan Cox, female    DOB: August 10, 1940  Age: 74 y.o. MRN: [id number]  CC: The primary encounter diagnosis was Anxiety. Diagnoses of Long-term use of high-risk medication, Hypothyroidism due to acquired atrophy of thyroid, Vitamin D deficiency, Need for prophylactic vaccination against Streptococcus pneumoniae (pneumococcus), Obesity, Essential hypertension, and Counseling for travel were also pertinent to this visit.  HPI Meagan Cox presents for follow up on hypothyroidism.  GAD, and obesity .  She feels generally well and has no new complaints.  Cough has resolved.  She reached a nadir weight of 215 lbs, but  has gained two lbs since March due to lack of exercise. She is following a low glycemic index diet.   Outpatient Prescriptions Prior to Visit  Medication Sig Dispense Refill  . Cyanocobalamin (B-12) 2500 MCG TABS Take by mouth.    . diclofenac (CATAFLAM) 50 MG tablet Take 1 tablet (50 mg total) by mouth 2 (two) times daily. 180 tablet 1  . estradiol (ESTRACE) 2 MG tablet TAKE ONE TABLET BY MOUTH ONCE DAILY 90 tablet 0  . levothyroxine (SYNTHROID, LEVOTHROID) 125 MCG tablet TAKE ONE TABLET BY MOUTH ONCE DAILY 90 tablet 0  . Multiple Vitamin (MULTIVITAMIN) tablet Take 1 tablet by mouth daily.      Marland Kitchen ALPRAZolam (XANAX) 0.5 MG tablet Take 1 tablet (0.5 mg total) by mouth at bedtime as needed. 30 tablet 3   No facility-administered medications prior to visit.    Review of Systems;  Patient denies headache, fevers, malaise, unintentional weight loss, skin rash, eye pain, sinus congestion and sinus pain, sore throat, dysphagia,  hemoptysis , cough, dyspnea, wheezing, chest pain, palpitations, orthopnea, edema, abdominal pain, nausea, melena, diarrhea, constipation, flank pain, dysuria, hematuria, urinary  Frequency, nocturia, numbness, tingling, seizures,  Focal weakness, Loss of consciousness,  Tremor, insomnia, depression, anxiety, and suicidal  ideation.      Objective:  BP 118/78 mmHg  Pulse 69  Temp(Src) 97.6 F (36.4 C) (Oral)  Resp 12  Ht 5\' 4"  (1.626 m)  Wt 217 lb (98.431 kg)  BMI 37.23 kg/m2  SpO2 97%  BP Readings from Last 3 Encounters:  01/10/15 118/78  01/07/15 122/76  03/31/14 118/78    Wt Readings from Last 3 Encounters:  01/10/15 217 lb (98.431 kg)  03/31/14 215 lb 8 oz (97.75 kg)  11/19/13 217 lb 12 oz (98.771 kg)    General appearance: alert, cooperative and appears stated age Ears: normal TM's and external ear canals both ears Throat: lips, mucosa, and tongue normal; teeth and gums normal Neck: no adenopathy, no carotid bruit, supple, symmetrical, trachea midline and thyroid not enlarged, symmetric, no tenderness/mass/nodules Back: symmetric, no curvature. ROM normal. No CVA tenderness. Lungs: clear to auscultation bilaterally Heart: regular rate and rhythm, S1, S2 normal, no murmur, click, rub or gallop Abdomen: soft, non-tender; bowel sounds normal; no masses,  no organomegaly Pulses: 2+ and symmetric Skin: Skin color, texture, turgor normal. No rashes or lesions Lymph nodes: Cervical, supraclavicular, and axillary nodes normal.  No results found for: HGBA1C  Lab Results  Component Value Date   CREATININE 0.64 01/10/2015   CREATININE 0.69 03/31/2014   CREATININE CANCELED 11/19/2013    Lab Results  Component Value Date   GLUCOSE 86 01/10/2015   CHOL CANCELED 11/19/2013   TRIG CANCELED 11/19/2013   HDL CANCELED 11/19/2013   LDLCALC 63 05/08/2011   ALT 9 01/10/2015   AST 14 01/10/2015   NA 138 01/10/2015   K  4.2 01/10/2015   CL 103 01/10/2015   CREATININE 0.64 01/10/2015   BUN 13 01/10/2015   CO2 28 01/10/2015   TSH 2.83 01/10/2015    Dg Cervical Spine Complete  11/19/2013  CLINICAL DATA:  Right shoulder pain without history of trauma EXAM: CERVICAL SPINE  4+ VIEWS COMPARISON:  None. FINDINGS: The cervical vertebral bodies are preserved in height. There is mild loss of the  normal cervical lordosis. The disc space heights are well maintained. There is facet joint hypertrophy at multiple levels greater on the right than on the left. There is mild bony encroachment upon the neural foramina bilaterally. The odontoid is intact. IMPRESSION: There is moderate facet joint hypertrophy and uncovertebral joint hypertrophy bilaterally within the cervical spine. Given the patient's symptoms, cervical MRI may be useful. Electronically Signed   By: David  Martinique   On: 11/19/2013 15:50    Assessment & Plan:   Problem List Items Addressed This Visit    Obesity    I have addressed  BMI and recommended wt loss of 10% of body weight over the next 6 months using a low glycemic index diet and regular exercise a minimum of 5 days per week.        HTN (hypertension)    Well controlled on current regimen. Renal function stable, no changes today.  Lab Results  Component Value Date   CREATININE 0.64 01/10/2015   Lab Results  Component Value Date   NA 138 01/10/2015   K 4.2 01/10/2015   CL 103 01/10/2015   CO2 28 01/10/2015         Hypothyroidism    Thyroid function is WNL on current dose.  No current changes needed.   Lab Results  Component Value Date   TSH 2.83 01/10/2015         Relevant Orders   TSH (Completed)   Counseling for travel    Patient is travelling to [place] and taking a cruise.  Patient has been given medications to take with her m including levaquin and zofran        Other Visit Diagnoses    Anxiety    -  Primary    Relevant Medications    ALPRAZolam (XANAX) 0.5 MG tablet    Long-term use of high-risk medication        Relevant Orders    Comprehensive metabolic panel (Completed)    Vitamin D deficiency        Relevant Orders    VITAMIN D 25 Hydroxy (Vit-D Deficiency, Fractures) (Completed)    Need for prophylactic vaccination against Streptococcus pneumoniae (pneumococcus)        Relevant Orders    Pneumococcal conjugate vaccine 13-valent  (Completed)      A total of 25 minutes was spent with patient more than half of which was spent in counseling patient on the above mentioned issues , reviewing and explaining recent labs and imaging studies done, and coordination of care.  I am having Ms. Flenner start on levofloxacin, ondansetron, benzonatate, and Ivermectin. I am also having her maintain her multivitamin, B-12, diclofenac, estradiol, levothyroxine, and ALPRAZolam.  Meds ordered this encounter  Medications  . ALPRAZolam (XANAX) 0.5 MG tablet    Sig: Take 1 tablet (0.5 mg total) by mouth at bedtime as needed.    Dispense:  30 tablet    Refill:  3  . levofloxacin (LEVAQUIN) 500 MG tablet    Sig: Take 1 tablet (500 mg total) by mouth daily.  Dispense:  7 tablet    Refill:  0    For sinus infection  . ondansetron (ZOFRAN) 4 MG tablet    Sig: Take 1 tablet (4 mg total) by mouth every 8 (eight) hours as needed for nausea or vomiting.    Dispense:  20 tablet    Refill:  0  . benzonatate (TESSALON) 100 MG capsule    Sig: Take 2 capsules (200 mg total) by mouth 2 (two) times daily as needed for cough.    Dispense:  60 capsule    Refill:  0  . Ivermectin 1 % CREA    Sig: Apply 1 application topically daily.    Dispense:  30 g    Refill:  0    Medications Discontinued During This Encounter  Medication Reason  . ALPRAZolam (XANAX) 0.5 MG tablet Reorder    Follow-up: No Follow-up on file.   Crecencio Mc, MD

## 2015-01-11 DIAGNOSIS — Z7184 Encounter for health counseling related to travel: Secondary | ICD-10-CM | POA: Insufficient documentation

## 2015-01-11 NOTE — Progress Notes (Signed)
  I have reviewed the above information and agree with above.   Avrianna Smart, MD 

## 2015-01-11 NOTE — Assessment & Plan Note (Signed)
I have addressed  BMI and recommended wt loss of 10% of body weight over the next 6 months using a low glycemic index diet and regular exercise a minimum of 5 days per week.   

## 2015-01-11 NOTE — Assessment & Plan Note (Signed)
Thyroid function is WNL on current dose.  No current changes needed.   Lab Results  Component Value Date   TSH 2.83 01/10/2015

## 2015-01-11 NOTE — Assessment & Plan Note (Signed)
Patient is travelling to [place] and taking a cruise.  Patient has been given medications to take with her m including levaquin and zofran

## 2015-01-11 NOTE — Assessment & Plan Note (Signed)
Well controlled on current regimen. Renal function stable, no changes today.  Lab Results  Component Value Date   CREATININE 0.64 01/10/2015   Lab Results  Component Value Date   NA 138 01/10/2015   K 4.2 01/10/2015   CL 103 01/10/2015   CO2 28 01/10/2015

## 2015-01-14 ENCOUNTER — Encounter: Payer: Self-pay | Admitting: Internal Medicine

## 2015-01-30 HISTORY — PX: OTHER SURGICAL HISTORY: SHX169

## 2015-02-18 ENCOUNTER — Other Ambulatory Visit: Payer: Self-pay | Admitting: Internal Medicine

## 2015-03-25 DIAGNOSIS — M9903 Segmental and somatic dysfunction of lumbar region: Secondary | ICD-10-CM | POA: Diagnosis not present

## 2015-03-25 DIAGNOSIS — M9901 Segmental and somatic dysfunction of cervical region: Secondary | ICD-10-CM | POA: Diagnosis not present

## 2015-03-25 DIAGNOSIS — M542 Cervicalgia: Secondary | ICD-10-CM | POA: Diagnosis not present

## 2015-03-25 DIAGNOSIS — M545 Low back pain: Secondary | ICD-10-CM | POA: Diagnosis not present

## 2015-04-21 ENCOUNTER — Encounter: Payer: Self-pay | Admitting: Internal Medicine

## 2015-04-21 ENCOUNTER — Ambulatory Visit (INDEPENDENT_AMBULATORY_CARE_PROVIDER_SITE_OTHER): Payer: PPO | Admitting: Internal Medicine

## 2015-04-21 VITALS — BP 108/76 | HR 66 | Temp 97.3°F | Resp 12 | Ht 64.0 in | Wt 213.8 lb

## 2015-04-21 DIAGNOSIS — E669 Obesity, unspecified: Secondary | ICD-10-CM

## 2015-04-21 DIAGNOSIS — M25551 Pain in right hip: Secondary | ICD-10-CM | POA: Diagnosis not present

## 2015-04-21 DIAGNOSIS — I10 Essential (primary) hypertension: Secondary | ICD-10-CM

## 2015-04-21 DIAGNOSIS — E038 Other specified hypothyroidism: Secondary | ICD-10-CM | POA: Diagnosis not present

## 2015-04-21 DIAGNOSIS — E559 Vitamin D deficiency, unspecified: Secondary | ICD-10-CM

## 2015-04-21 DIAGNOSIS — I679 Cerebrovascular disease, unspecified: Secondary | ICD-10-CM

## 2015-04-21 DIAGNOSIS — H8110 Benign paroxysmal vertigo, unspecified ear: Secondary | ICD-10-CM

## 2015-04-21 DIAGNOSIS — E034 Atrophy of thyroid (acquired): Secondary | ICD-10-CM

## 2015-04-21 MED ORDER — PREDNISONE 10 MG PO TABS: ORAL_TABLET | ORAL | Status: DC

## 2015-04-21 MED ORDER — DIAZEPAM 5 MG PO TABS: 5.0000 mg | ORAL_TABLET | Freq: Two times a day (BID) | ORAL | Status: DC | PRN

## 2015-04-21 NOTE — Patient Instructions (Signed)
I am referring you to Marcella Dubs at [facility] PT dept to help your thigh muscle problems   Prednisone and valium for next prolonged episode of vertigo  Return in June for fasting labs.

## 2015-04-21 NOTE — Progress Notes (Signed)
Pre-visit discussion using our clinic review tool. No additional management support is needed unless otherwise documented below in the visit note.  

## 2015-04-21 NOTE — Progress Notes (Signed)
Subjective:  Patient ID: Meagan Cox, female    DOB: [birth year]  Age: 75 y.o. MRN: [id number]  CC: The primary encounter diagnosis was Pain in joint, pelvic region and thigh, right. Diagnoses of Obesity, Essential hypertension, Hypothyroidism due to acquired atrophy of thyroid, Cerebrovascular small vessel disease, Benign paroxysmal positional vertigo, unspecified laterality, and Vitamin D deficiency were also pertinent to this visit.  HPI Meagan Cox presents for follow up on chronic conditions s  1) recent episode of vertigo that lasted 3 weeks .  Couldn't turn over in bed. Without having waves of vertigo,  .  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  Has been present for years.  Had the MRI  head ordered by [facility] in Sept   chronic ischemic changes and Normal cervical spine normal   4) Helping her lose weight 25 lbs thus far  5) Persistent pain right inner thigh .  Described as a feeling of tightness. Involving the adductor and sartorisus muscles    Outpatient Prescriptions Prior to Visit  Medication Sig Dispense Refill  . ALPRAZolam (XANAX) 0.5 MG tablet Take 1 tablet (0.5 mg total) by mouth at bedtime as needed. 30 tablet 3  . diclofenac (CATAFLAM) 50 MG tablet Take 1 tablet (50 mg total) by mouth 2 (two) times daily. 180 tablet 1  . estradiol (ESTRACE) 2 MG tablet TAKE ONE TABLET BY MOUTH ONCE DAILY 90 tablet 3  . Ivermectin 1 % CREA Apply 1 application topically daily. 30 g 0  . levothyroxine (SYNTHROID, LEVOTHROID) 125 MCG tablet TAKE ONE TABLET BY MOUTH ONCE DAILY 90 tablet 3  . Multiple Vitamin (MULTIVITAMIN) tablet Take 1 tablet by mouth daily.      . benzonatate (TESSALON) 100 MG capsule Take 2 capsules (200 mg total) by mouth 2 (two) times daily as needed for cough. (Patient not taking:  Reported on 04/21/2015) 60 capsule 0  . Cyanocobalamin (B-12) 2500 MCG TABS Take by mouth. Reported on 04/21/2015    . levofloxacin (LEVAQUIN) 500 MG tablet Take 1 tablet (500 mg total) by mouth daily. 7 tablet 0  . ondansetron (ZOFRAN) 4 MG tablet Take 1 tablet (4 mg total) by mouth every 8 (eight) hours as needed for nausea or vomiting. 20 tablet 0   No facility-administered medications prior to visit.    Review of Systems;  Patient denies headache, fevers, malaise, unintentional weight loss, skin rash, eye pain, sinus congestion and sinus pain, sore throat, dysphagia,  hemoptysis , cough, dyspnea, wheezing, chest pain, palpitations, orthopnea, edema, abdominal pain, nausea, melena, diarrhea, constipation, flank pain, dysuria, hematuria, urinary  Frequency, nocturia, numbness, tingling, seizures,  Focal weakness, Loss of consciousness,  Tremor, insomnia, depression, anxiety, and suicidal ideation.      Objective:  BP 108/76 mmHg  Pulse 66  Temp(Src) 97.3 F (36.3 C) (Oral)  Resp 12  Ht 5\' 4"  (1.626 m)  Wt 213 lb 12 oz (96.956 kg)  BMI 36.67 kg/m2  SpO2 94%  BP Readings from Last 3 Encounters:  04/21/15 108/76  01/10/15 118/78  01/07/15 122/76    Wt Readings from Last 3 Encounters:  04/21/15 213 lb 12 oz (96.956 kg)  01/10/15 217 lb (98.431 kg)  03/31/14 215 lb 8 oz (97.75 kg)    General appearance: alert,  cooperative and appears stated age Ears: normal TM's and external ear canals both ears Throat: lips, mucosa, and tongue normal; teeth and gums normal Neck: no adenopathy, no carotid bruit, supple, symmetrical, trachea midline and thyroid not enlarged, symmetric, no tenderness/mass/nodules Back: symmetric, no curvature. ROM normal. No CVA tenderness. Lungs: clear to auscultation bilaterally Heart: regular rate and rhythm, S1, S2 normal, no murmur, click, rub or gallop Abdomen: soft, non-tender; bowel sounds normal; no masses,  no organomegaly Pulses: 2+ and  symmetric Skin: Skin color, texture, turgor normal. No rashes or lesions Lymph nodes: Cervical, supraclavicular, and axillary nodes normal.  No results found for: HGBA1C  Lab Results  Component Value Date   CREATININE 0.64 01/10/2015   CREATININE 0.69 03/31/2014   CREATININE CANCELED 11/19/2013    Lab Results  Component Value Date   GLUCOSE 86 01/10/2015   CHOL CANCELED 11/19/2013   TRIG CANCELED 11/19/2013   HDL CANCELED 11/19/2013   LDLCALC 63 05/08/2011   ALT 9 01/10/2015   AST 14 01/10/2015   NA 138 01/10/2015   K 4.2 01/10/2015   CL 103 01/10/2015   CREATININE 0.64 01/10/2015   BUN 13 01/10/2015   CO2 28 01/10/2015   TSH 2.83 01/10/2015    Dg Cervical Spine Complete  11/19/2013  CLINICAL DATA:  Right shoulder pain without history of trauma EXAM: CERVICAL SPINE  4+ VIEWS COMPARISON:  None. FINDINGS: The cervical vertebral bodies are preserved in height. There is mild loss of the normal cervical lordosis. The disc space heights are well maintained. There is facet joint hypertrophy at multiple levels greater on the right than on the left. There is mild bony encroachment upon the neural foramina bilaterally. The odontoid is intact. IMPRESSION: There is moderate facet joint hypertrophy and uncovertebral joint hypertrophy bilaterally within the cervical spine. Given the patient's symptoms, cervical MRI may be useful. Electronically Signed   By: David  Martinique   On: 11/19/2013 15:50    Assessment & Plan:   Problem List Items Addressed This Visit    Obesity    I have congratulated her in reduction of   BMI and encouraged  Continued weight loss with goal of 10% of body weigh over the next 6 months using a low glycemic index diet and regular exercise a minimum of 5 days per week.        HTN (hypertension)    Well controlled on current regimen. Renal function stable, no changes today.  Lab Results  Component Value Date   CREATININE 0.64 01/10/2015   Lab Results  Component  Value Date   NA 138 01/10/2015   K 4.2 01/10/2015   CL 103 01/10/2015   CO2 28 01/10/2015         Relevant Orders   Comprehensive metabolic panel   Hypothyroidism    Thyroid function is WNL on current dose.  No current changes needed.   Lab Results  Component Value Date   TSH 2.83 01/10/2015         Relevant Orders   TSH   Cerebrovascular small vessel disease    Found on MRI of brain done several months ago . Symptoms of loss of sensation and taste to a portion of her upper palate and tongue.  Fasting labs ordered.. May benefit from statin therapy if tolerated.       Relevant Orders   Lipid panel   Benign paroxysmal positional vertigo    Now resolved,  rx for prednisone taper and diazepam for next episode.  Pain in joint, pelvic region and thigh - Primary    persistentt for over 6 months.  appears to need PT for stretching.  Referral made       Relevant Orders   Ambulatory referral to Physical Therapy    Other Visit Diagnoses    Vitamin D deficiency        Relevant Orders    VITAMIN D 25 Hydroxy (Vit-D Deficiency, Fractures)     A total of 25 minutes of face to face time was spent with patient more than half of which was spent in counselling about the above mentioned conditions  and coordination of care   I have discontinued Ms. Hertzog's levofloxacin and ondansetron. I am also having her start on diazepam and predniSONE. Additionally, I am having her maintain her multivitamin, B-12, diclofenac, ALPRAZolam, benzonatate, Ivermectin, estradiol, and levothyroxine.  Meds ordered this encounter  Medications  . diazepam (VALIUM) 5 MG tablet    Sig: Take 1 tablet (5 mg total) by mouth every 12 (twelve) hours as needed for anxiety.    Dispense:  30 tablet    Refill:  1  . predniSONE (DELTASONE) 10 MG tablet    Sig: 6 tablets on Day 1 , then reduce by 1 tablet daily until gone    Dispense:  21 tablet    Refill:  0    Medications Discontinued During This  Encounter  Medication Reason  . levofloxacin (LEVAQUIN) 500 MG tablet Completed Course  . ondansetron (ZOFRAN) 4 MG tablet Completed Course    Follow-up: Return in about 3 months (around 07/22/2015) for fasitng labs prior to visit ,  CPE needed .   Crecencio Mc, MD

## 2015-04-24 DIAGNOSIS — M25561 Pain in right knee: Secondary | ICD-10-CM | POA: Insufficient documentation

## 2015-04-24 DIAGNOSIS — I679 Cerebrovascular disease, unspecified: Secondary | ICD-10-CM | POA: Insufficient documentation

## 2015-04-24 DIAGNOSIS — M25559 Pain in unspecified hip: Secondary | ICD-10-CM

## 2015-04-24 DIAGNOSIS — H8110 Benign paroxysmal vertigo, unspecified ear: Secondary | ICD-10-CM | POA: Insufficient documentation

## 2015-04-24 NOTE — Assessment & Plan Note (Signed)
Thyroid function is WNL on current dose.  No current changes needed.   Lab Results  Component Value Date   TSH 2.83 01/10/2015

## 2015-04-24 NOTE — Assessment & Plan Note (Signed)
I have congratulated her in reduction of   BMI and encouraged  Continued weight loss with goal of 10% of body weigh over the next 6 months using a low glycemic index diet and regular exercise a minimum of 5 days per week.    

## 2015-04-24 NOTE — Assessment & Plan Note (Addendum)
Found on MRI of brain done several months ago . Symptoms of loss of sensation and taste to a portion of her upper palate and tongue.  Fasting labs ordered.. May benefit from statin therapy if tolerated.

## 2015-04-24 NOTE — Assessment & Plan Note (Signed)
Well controlled on current regimen. Renal function stable, no changes today.  Lab Results  Component Value Date   CREATININE 0.64 01/10/2015   Lab Results  Component Value Date   NA 138 01/10/2015   K 4.2 01/10/2015   CL 103 01/10/2015   CO2 28 01/10/2015

## 2015-04-24 NOTE — Assessment & Plan Note (Signed)
persistentt for over 6 months.  appears to need PT for stretching.  Referral made

## 2015-04-24 NOTE — Assessment & Plan Note (Signed)
Now resolved,  rx for prednisone taper and diazepam for next episode.

## 2015-05-02 DIAGNOSIS — M1711 Unilateral primary osteoarthritis, right knee: Secondary | ICD-10-CM | POA: Diagnosis not present

## 2015-05-02 DIAGNOSIS — M25512 Pain in left shoulder: Secondary | ICD-10-CM | POA: Diagnosis not present

## 2015-05-17 ENCOUNTER — Ambulatory Visit: Payer: PPO | Attending: Internal Medicine | Admitting: Physical Therapy

## 2015-05-17 ENCOUNTER — Encounter: Payer: Self-pay | Admitting: Physical Therapy

## 2015-05-17 DIAGNOSIS — R2689 Other abnormalities of gait and mobility: Secondary | ICD-10-CM | POA: Diagnosis not present

## 2015-05-17 DIAGNOSIS — M791 Myalgia, unspecified site: Secondary | ICD-10-CM

## 2015-05-17 DIAGNOSIS — R293 Abnormal posture: Secondary | ICD-10-CM | POA: Diagnosis not present

## 2015-05-17 NOTE — Patient Instructions (Signed)
1) Unlock knees when standing , pelvic neutral,weight shifts more betweeen ball mounds and heels (not only at heels)   2) Stretch:  Seated cross-over    With towel under thigh (opp hand hold towel, twist trunk to opp side)

## 2015-05-18 NOTE — Therapy (Addendum)
[place] MAIN [facility] SERVICES [address] [place], [place], [address] Phone: [phone]   Fax:  [phone]  Physical Therapy Evaluation  Patient Details  Name: Meagan Cox MRN: [id number] Date of Birth: [birth year] Referring Provider: Derrel Nip MD  Encounter Date: 05/17/2015      PT End of Session - 05/18/15 2245    Visit Number 1   Number of Visits 12   Date for PT Re-Evaluation 08/03/15   Authorization Type 1 of 10 G-Code   PT Start Time [id number]   PT Stop Time 1545   PT Time Calculation (min) 60 min   Activity Tolerance Patient tolerated treatment well;No increased pain   Behavior During Therapy [facility] for tasks assessed/performed      Past Medical History  Diagnosis Date  . Hypertension   . Hyperlipidemia   . Hypothyroidism     Past Surgical History  Procedure Laterality Date  . Cardiac catheterization      30 years ago, [place]  . Tonsillectomy    . Partial hysterectomy    . Abdominal hysterectomy      There were no vitals filed for this visit.       Subjective Assessment - 05/17/15 1450    Subjective 1) Pt reported insiduous R medial thigh pain started 2-3 years ago without relief with chriporactic, PT, massage therapy, NSAIDs.  Non-radiating pain.  It is a constant nagging pain. At its worst 9/10 without aggravating factors. Ocassionally at night , pain wakes her up when she ends up on her belly.   2)   SIJ  for the past 5 years that is "sore".   With long standing periods > 5 min , the R medial thigh pain comes on with SIJ pain. 8/10.    3) L posterior buttock that radiating down to mid posterior thigh: started in the last 57months insiduously.  All three areas of pain has worsened over over time.   Pt experiences her R leg has "given out on her 2-3x a day and has not had any falls.  Pt has started using a foldable cane when she travels and walking long period over old cob[place] roads or standing in a line.    4) poor sleep: nocturia 1x/ night 2-3 x week. unable comfortable position. Drinks decaf tea 5 glasses (16 fl oz)  per day and "hate water".   Pt also "hates" exercising on a regular basis. Pt goes to water aerobics 1-2 x week.     Pertinent History R medial thigh pain: Pt has trouble sleeping  on L side due to L arm pain  and tries to sleep on her back and belly.  Pt has difficulty sleeping on R  side due to R hip bursitis. Hx of R knee issues (meniscus "slipping out out place" and chriporactor helps to realign). Resolved for the past 1-2 years.   Gynecological: 3 vaginal birth,  without perineal trauma.  SUI . Denied saddle signs.  Pt travels to [place] 2x a year. Pt will be leaving in August for her next trip.     Patient Stated Goals standing long periods at the airport and traveling with less pain. "Quit hurting."   Currently in Pain? No/denies            [facility] PT Assessment - 05/18/15 2256    Assessment   Medical Diagnosis Pain in pelvic region    Referring Provider Tullo MD   Precautions   Precautions None  Restrictions   Weight Bearing Restrictions No   Balance Screen   Has the patient fallen in the past 6 months No   Observation/Other Assessments   Observations collapsed arches L > R, genu valgus, pelvic anteriorly tilted. Decreased medial R tigh pain with cue for pelvic neutral and unlocked knees   Coordination   Gross Motor Movements are Fluid and Coordinated --  diaphragmatic breathing noted   Fine Motor Movements are Fluid and Coordinated --  will assess pelvic floor coordination w/ diaphragm next visi   Posture/Postural Control   Posture Comments lumbopelvic instability with hip flexion and bed mobility    ROM / Strength   AROM / PROM / Strength --  hip abd 3/5 bilaterally    PROM   Overall PROM Comments L hip IR in 90-90 position supine: 0 deg (pre-Tx) , ~5 deg (post-Tx),  R hip IR ~ 5 deg    Flexibility   Piriformis L limited > R    Obturator Internus tensions  noted along L obturator rami,    Palpation   SI assessment  L PSIS more posterior (pore-Tx), symmetrical (post-Tx and without tenderness)    Palpation comment Tenderness and tensions at L coccygeus / obt internus mm   decreased post-Tx with increased L hip IR    Bed Mobility   Bed Mobility --  cuing for briding and stability principles, breathholding    Ambulation/Gait   Gait Comments antalgic gait                    [facility] Adult PT Treatment/Exercise - 05/18/15 2244    Bed Mobility   Bed Mobility --  cuing for briding and stability principles, breathholding    Neuro Re-ed    Neuro Re-ed Details  see pt instructions   Exercises   Exercises --  see pt instructions                PT Education - 05/18/15 2245    Education provided Yes   Education Details POC, anatomy/physiology, goals, HEP   Person(s) Educated Patient   Methods Explanation;Demonstration;Tactile cues;Verbal cues;Handout   Comprehension Returned demonstration;Verbalized understanding             PT Long Term Goals - 05/18/15 2259    PT LONG TERM GOAL #1   Title Pt will decrease her ODI score from 24% to < 20% in order to travel on her trips to [place] and walk long distances on uneven surfaces.   Time 12   Period Weeks   Status New   PT LONG TERM GOAL #2   Title Pt will decrease her PSQI score from 29% to < 24% in order to improve sleep quality and demonstrate ability to lay in a comfortable position.    Time 12   Period Weeks   Status New   PT LONG TERM GOAL #3   Title Pt will be complaint with water intake in order to promote bladder health and hydration for muscles for walking.    Time 12   Period Weeks   Status New   PT LONG TERM GOAL #4   Title Pt will demo no pelvic asymmetries and increased hip PROM IR bilaterally > 10 deg in order to improve gait mechanics for ambulation.    Time 12   Period Weeks   Status New               Plan - 05/18/15 2247    Clinical  Impression  Statement Pt is a 75 yo female who c/o chronic pain located at R medial thigh, L posterior glut, SIJ area, and also has poor sleep due to nocturia, having difficulty finding a comfortable position. Pt''s deficits impact her QOL and ability to travel and walk long distances.  THrough PT exam, pt's deficits are likely related to her pelvic obliquities, poor posture, increased mm tensions of hips and pelvic floor, severely limited hip mobility, and poor coordination/ strength of deep core mm , and weakness of B hips and foot arches. Pt showed more symmetrically aligned pelvis and increased SIJ and hip mobility w/ manual Tx and ther ex. Pt reported 50% improvement post-Tx.     Rehab Potential Good   PT Frequency 1x / week   PT Duration 12 weeks   PT Treatment/Interventions ADLs/Self Care Home Management;Aquatic Therapy;Gait training;Traction;Moist Heat;Stair training;Functional mobility training;Therapeutic activities;Therapeutic exercise;Balance training;Neuromuscular re-education;Electrical Stimulation;Cryotherapy;Manual techniques;Patient/family education;Passive range of motion;Dry needling;Energy conservation   Consulted and Agree with Plan of Care Patient      Patient will benefit from skilled therapeutic intervention in order to improve the following deficits and impairments:  Abnormal gait, Pain, Improper body mechanics, Postural dysfunction, Other (comment), Decreased mobility, Decreased coordination, Decreased activity tolerance, Decreased endurance, Decreased balance, Decreased safety awareness, Difficulty walking, Impaired flexibility, Decreased range of motion, Obesity, Decreased strength, Decreased scar mobility, Increased muscle spasms  Visit Diagnosis: Other abnormalities of gait and mobility - Plan: PT plan of care cert/re-cert  Myalgia - Plan: PT plan of care cert/re-cert  Poor posture - Plan: PT plan of care cert/re-cert      G-Codes - 99991111 2353/05/06    Functional  Assessment Tool Used ODI 24%, PSQI 29%    Functional Limitation Self care;Mobility: Walking and moving around   Mobility: Walking and Moving Around Current Status [phone]) At least 20 percent but less than 40 percent impaired, limited or restricted   Mobility: Walking and Moving Around Goal Status [phone]) At least 1 percent but less than 20 percent impaired, limited or restricted   Self Care Current Status [id number]) At least 20 percent but less than 40 percent impaired, limited or restricted   Self Care Goal Status [id number]) At least 1 percent but less than 20 percent impaired, limited or restricted       Problem List Patient Active Problem List   Diagnosis Date Noted  . Cerebrovascular small vessel disease 04/24/2015  . Benign paroxysmal positional vertigo 04/24/2015  . Pain in joint, pelvic region and thigh 04/24/2015  . Counseling for travel 01/11/2015  . Shoulder pain, right 11/22/2013  . Statin intolerance 10/08/2012  . Edema 05/14/2012  . Generalized anxiety disorder 01/06/2012  . 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 Hypothyroidism 12/18/2010  . Low back pain radiating to both legs 12/18/2010  . Hip pain, right 12/18/2010  . Obesity 05/09/2010  . CAD (coronary artery disease) 05/09/2010  . Hyperlipidemia 05/09/2010  . HTN (hypertension) 05/09/2010    Jerl Mina ,PT, DPT, E-RYT  05/18/2015, 11:08 PM  [place] MAIN [facility] SERVICES [address] [place], [place], [address] Phone: [phone]   Fax:  [phone]  Name: Meagan Cox MRN: [id number] Date of Birth: [birth year]

## 2015-05-18 NOTE — Addendum Note (Signed)
Addended by: Jerl Mina on: 05/18/2015 11:08 PM   Modules accepted: Orders

## 2015-05-24 ENCOUNTER — Ambulatory Visit: Payer: PPO | Admitting: Physical Therapy

## 2015-05-24 ENCOUNTER — Encounter: Payer: Medicare Other | Admitting: Physical Therapy

## 2015-05-24 DIAGNOSIS — M791 Myalgia, unspecified site: Secondary | ICD-10-CM

## 2015-05-24 DIAGNOSIS — R293 Abnormal posture: Secondary | ICD-10-CM

## 2015-05-24 DIAGNOSIS — R2689 Other abnormalities of gait and mobility: Secondary | ICD-10-CM | POA: Diagnosis not present

## 2015-05-24 NOTE — Patient Instructions (Addendum)
You are now ready to begin training the deep core muscles system: diaphragm, transverse abdominis, pelvic floor . These muscles must work together as a team.           The key to these exercises to train the brain to coordinate the timing of these muscles and to have them turn on for long periods of time to hold you upright against gravity (especially important if you are on your feet all day).These muscles are postural muscles and play a role stabilizing your spine and bodyweight. By doing these repetitions slowly and correctly instead of doing crunches, you will achieve a flatter belly without a lower pooch. You are also placing your spine in a more neutral position and breathing properly which in turn, decreases your risk for problems related to your pelvic floor, abdominal, and low back such as pelvic organ prolapse, hernias, diastasis recti (separation of superficial muscles), disk herniations, spinal fractures. These exercises set a solid foundation for you to later progress to resistance/ strength training with therabands and weights and return to other typical fitness exercises with a stronger deeper core.   Level 2 (10 rep x 3 sets  in the morning and  at night)     EXHALE with lifting leg   Align knee with hip   Before the exercise, relax your back with slight posterior tilt of pelvis (zip the zipper)

## 2015-05-25 NOTE — Therapy (Signed)
[place] MAIN [facility] SERVICES [address] [place], [place], [address] Phone: [phone]   Fax:  [phone]  Physical Therapy Treatment  Patient Details  Name: Meagan Cox MRN: [id number] Date of Birth: Jul 01, 1940 Referring Provider: Derrel Nip MD  Encounter Date: 05/24/2015      PT End of Session - 05/25/15 1955    Visit Number 2   Number of Visits 12   Date for PT Re-Evaluation 08/03/15   Authorization Type 2 of 10 G-Code   PT Start Time 0900   PT Stop Time 1000   PT Time Calculation (min) 60 min   Activity Tolerance Patient tolerated treatment well;No increased pain   Behavior During Therapy [facility] for tasks assessed/performed      Past Medical History  Diagnosis Date  . Hypertension   . Hyperlipidemia   . Hypothyroidism     Past Surgical History  Procedure Laterality Date  . Cardiac catheterization      30 years ago, [place]  . Tonsillectomy    . Partial hysterectomy    . Abdominal hysterectomy      There were no vitals filed for this visit.      Subjective Assessment - 05/25/15 2000    Subjective Pt reported no more L radiating LBP to mid posterior thigh since last session. R medial thigh pain remains the same and the pain has "given out" on her several times (average of 5x)  a day with increased upright positions (walking, standing).     Pertinent History R medial thigh pain: Pt has trouble sleeping  on L side due to L arm pain  and tries to sleep on her back and belly.  Pt has difficulty sleeping on R  side due to R hip bursitis. Hx of R knee issues (meniscus "slipping out out place" and chriporactor helps to realign). Resolved for the past 1-2 years.   Gynecological: 3 vaginal birth,  without perineal trauma.  SUI . Denied saddle signs.  Pt travels to [place] 2x a year. Pt will be leaving in August for her next trip.     Patient Stated Goals standing long periods at the airport and traveling with less pain. "Quit  hurting."            [facility] PT Assessment - 05/25/15 1952    PROM   Overall PROM Comments L hip IR in 90-90 position supine: ~10 deg,  R hip IR ~ 5 deg w/ compensation of spinal sideflexion     Flexibility   Piriformis R piriformis stretch limited by p!   Obturator Internus no tensions noted along L obturator rami,    Palpation   SI assessment  minor tenderness with palpation on L PSIS and more symmetrical compared to alst session                     [place] Adult PT Treatment/Exercise - 05/25/15 1953    Manual Therapy   Manual therapy comments distraction of LLE, PROM hip abd/ER within range before pain3-5 deg    STM , myofascial massage over L medial thigh                PT Education - 05/25/15 1955    Education provided Yes   Education Details HEP   Person(s) Educated Patient   Methods Explanation;Demonstration;Tactile cues;Verbal cues;Handout   Comprehension Returned demonstration;Verbalized understanding             PT Long Term Goals -  05/18/15 2259    PT LONG TERM GOAL #1   Title Pt will decrease her ODI score from 24% to < 20% in order to travel on her trips to [place] and walk long distances on uneven surfaces.   Time 12   Period Weeks   Status New   PT LONG TERM GOAL #2   Title Pt will decrease her PSQI score from 29% to < 24% in order to improve sleep quality and demonstrate ability to lay in a comfortable position.    Time 12   Period Weeks   Status New   PT LONG TERM GOAL #3   Title Pt will be complaint with water intake in order to promote bladder health and hydration for muscles for walking.    Time 12   Period Weeks   Status New   PT LONG TERM GOAL #4   Title Pt will demo no pelvic asymmetries and increased hip PROM IR bilaterally > 10 deg in order to improve gait mechanics for ambulation.    Time 12   Period Weeks   Status New               Plan - 05/25/15 1956    Clinical Impression Statement Pt is progressing  well with resolved radiating L glut pain since last session and also showed symmetrical alignment as a result of last treatment. Pt's R medial thigh pain is likely due to pt's genu valgus,hip weakness, and por deep core strength. Pt responded with improved hip ROM and showed positive response to pain science education and learned to move within nonpainful range with R hip ER/abd. Pt progressed to deep core strengthening today without difficulty.  Plan to apply PNF at next session.    Rehab Potential Good   PT Frequency 1x / week   PT Duration 12 weeks   PT Treatment/Interventions ADLs/Self Care Home Management;Aquatic Therapy;Gait training;Traction;Moist Heat;Stair training;Functional mobility training;Therapeutic activities;Therapeutic exercise;Balance training;Neuromuscular re-education;Electrical Stimulation;Cryotherapy;Manual techniques;Patient/family education;Passive range of motion;Dry needling;Energy conservation   Consulted and Agree with Plan of Care Patient      Patient will benefit from skilled therapeutic intervention in order to improve the following deficits and impairments:  Abnormal gait, Pain, Improper body mechanics, Postural dysfunction, Other (comment), Decreased mobility, Decreased coordination, Decreased activity tolerance, Decreased endurance, Decreased balance, Decreased safety awareness, Difficulty walking, Impaired flexibility, Decreased range of motion, Obesity, Decreased strength, Decreased scar mobility, Increased muscle spasms  Visit Diagnosis: Other abnormalities of gait and mobility  Myalgia  Poor posture     Problem List Patient Active Problem List   Diagnosis Date Noted  . Cerebrovascular small vessel disease 04/24/2015  . Benign paroxysmal positional vertigo 04/24/2015  . Pain in joint, pelvic region and thigh 04/24/2015  . Counseling for travel 01/11/2015  . Shoulder pain, right 11/22/2013  . Statin intolerance 10/08/2012  . Edema 05/14/2012  .  Generalized anxiety disorder 01/06/2012  . 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 Hypothyroidism 12/18/2010  . Low back pain radiating to both legs 12/18/2010  . Hip pain, right 12/18/2010  . Obesity 05/09/2010  . CAD (coronary artery disease) 05/09/2010  . Hyperlipidemia 05/09/2010  . HTN (hypertension) 05/09/2010    Jerl Mina ,PT, DPT, E-RYT  05/25/2015, 8:00 PM  [place] MAIN [facility] SERVICES [address] [place], [place], [address] Phone: [phone]   Fax:  [phone]  Name: Meagan Cox MRN: [id number] Date of Birth: 14-Jun-1940

## 2015-05-30 ENCOUNTER — Other Ambulatory Visit: Payer: Self-pay | Admitting: Internal Medicine

## 2015-05-31 ENCOUNTER — Ambulatory Visit: Payer: PPO | Attending: Internal Medicine | Admitting: Physical Therapy

## 2015-05-31 DIAGNOSIS — R293 Abnormal posture: Secondary | ICD-10-CM | POA: Insufficient documentation

## 2015-05-31 DIAGNOSIS — R2689 Other abnormalities of gait and mobility: Secondary | ICD-10-CM | POA: Diagnosis not present

## 2015-05-31 DIAGNOSIS — M791 Myalgia, unspecified site: Secondary | ICD-10-CM

## 2015-05-31 NOTE — Patient Instructions (Addendum)
1. Deep core level 2 (continue) 30 x 3 sets 2x day 2. Massage over scars (drawn handout) 5 min with argon oil  3. Seated clam shells (yellow band at thigh, knee aligned at hips) 10 x 3 reps

## 2015-05-31 NOTE — Therapy (Signed)
[place] MAIN [facility] SERVICES [address] [place], [place], [address] Phone: [phone]   Fax:  [phone]  Physical Therapy Treatment  Patient Details  Name: Meagan Cox MRN: [id number] Date of Birth: February 03, 1940 Referring Provider: Derrel Nip MD  Encounter Date: 05/31/2015      PT End of Session - 05/31/15 1826    Visit Number 3   Number of Visits 12   Date for PT Re-Evaluation 08/03/15   Authorization Type 3 of 10 G-Code   PT Start Time 1300   PT Stop Time 1405   PT Time Calculation (min) 65 min   Activity Tolerance Patient tolerated treatment well;No increased pain   Behavior During Therapy [facility] for tasks assessed/performed      Past Medical History  Diagnosis Date  . Hypertension   . Hyperlipidemia   . Hypothyroidism     Past Surgical History  Procedure Laterality Date  . Cardiac catheterization      30 years ago, [place]  . Tonsillectomy    . Partial hysterectomy    . Abdominal hysterectomy      There were no vitals filed for this visit.      Subjective Assessment - 05/31/15 1409    Subjective Pt has not had L radiating pain for the past 2 weeks. Pt continues to have the R medial thigh pain. Pt has tried to perform her hip ROM/ deep core exercises but she is unable to abduct/ER her R thigh very far.    Pertinent History R medial thigh pain: Pt has trouble sleeping  on L side due to L arm pain  and tries to sleep on her back and belly.  Pt has difficulty sleeping on R  side due to R hip bursitis. Hx of R knee issues (meniscus "slipping out out place" and chriporactor helps to realign). Resolved for the past 1-2 years.   Gynecological: 3 vaginal birth,  without perineal trauma.  SUI . Denied saddle signs.  Pt travels to [place] 2x a year. Pt will be leaving in August for her next trip.     Patient Stated Goals standing long periods at the airport and traveling with less pain. "Quit hurting."            [facility] PT  Assessment - 05/31/15 1821    AROM   Overall AROM Comments hip abd/ER R pre-Tx: ~5 deg, post-Tx 25 deg    Palpation   Palpation comment fascial restrictions along suprapubic scars, R pubic rami, and adductor magnus (decraesed post-Tx)    Ambulation/Gait   Gait Comments pre-Tx: 0.67 m/s, post-Tx 1.0 m/s                    Pelvic Floor Special Questions - 05/31/15 1821    Pelvic Floor Internal Exam pt verbally consented without contraindications   Exam Type Vaginal   Palpation increased mm tensions anterior portion of R obt int            [facility] Adult PT Treatment/Exercise - 05/31/15 1821    Exercises   Exercises --  see pt instructions   Manual Therapy   Manual therapy comments fascial releases over problem aeas noted under "ASsessment"                 PT Education - 05/31/15 1826    Education provided Yes   Education Details HEP   Person(s) Educated Patient   Methods Explanation;Demonstration;Tactile cues;Verbal cues;Handout   Comprehension Returned  demonstration;Verbalized understanding             PT Long Term Goals - 05/18/15 2259    PT LONG TERM GOAL #1   Title Pt will decrease her ODI score from 24% to < 20% in order to travel on her trips to [place] and walk long distances on uneven surfaces.   Time 12   Period Weeks   Status New   PT LONG TERM GOAL #2   Title Pt will decrease her PSQI score from 29% to < 24% in order to improve sleep quality and demonstrate ability to lay in a comfortable position.    Time 12   Period Weeks   Status New   PT LONG TERM GOAL #3   Title Pt will be complaint with water intake in order to promote bladder health and hydration for muscles for walking.    Time 12   Period Weeks   Status New   PT LONG TERM GOAL #4   Title Pt will demo no pelvic asymmetries and increased hip PROM IR bilaterally > 10 deg in order to improve gait mechanics for ambulation.    Time 12   Period Weeks   Status New                Plan - 05/31/15 1827    Clinical Impression Statement Pt responded well to manual Tx that addressed   suprabic scar restrictions as well as the soft tissue tensions along anterior triangle of pelvic floor mm and R adductors. Pt reported feeling "better" post Tx but was not able to place a number when asked to rate the changes in her pain. Pt continues to show progress with increased gait speed and hip ROM following manual Tx today. Initiated hip strengthening to address her genu valgus in stance and gait and educated pt on self-massage of her abdominal scars to increase hip ROM. Pt will continue benefit from skilled PT to advance towards her goals.    Rehab Potential Good   PT Frequency 1x / week   PT Duration 12 weeks   PT Treatment/Interventions ADLs/Self Care Home Management;Aquatic Therapy;Gait training;Traction;Moist Heat;Stair training;Functional mobility training;Therapeutic activities;Therapeutic exercise;Balance training;Neuromuscular re-education;Electrical Stimulation;Cryotherapy;Manual techniques;Patient/family education;Passive range of motion;Dry needling;Energy conservation   Consulted and Agree with Plan of Care Patient      Patient will benefit from skilled therapeutic intervention in order to improve the following deficits and impairments:  Abnormal gait, Pain, Improper body mechanics, Postural dysfunction, Other (comment), Decreased mobility, Decreased coordination, Decreased activity tolerance, Decreased endurance, Decreased balance, Decreased safety awareness, Difficulty walking, Impaired flexibility, Decreased range of motion, Obesity, Decreased strength, Decreased scar mobility, Increased muscle spasms, Cardiopulmonary status limiting activity  Visit Diagnosis: Other abnormalities of gait and mobility  Myalgia  Poor posture     Problem List Patient Active Problem List   Diagnosis Date Noted  . Cerebrovascular small vessel disease 04/24/2015  .  Benign paroxysmal positional vertigo 04/24/2015  . Pain in joint, pelvic region and thigh 04/24/2015  . Counseling for travel 01/11/2015  . Shoulder pain, right 11/22/2013  . Statin intolerance 10/08/2012  . Edema 05/14/2012  . Generalized anxiety disorder 01/06/2012  . 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 Hypothyroidism 12/18/2010  . Low back pain radiating to both legs 12/18/2010  . Hip pain, right 12/18/2010  . Obesity 05/09/2010  . CAD (coronary artery disease) 05/09/2010  . Hyperlipidemia 05/09/2010  . HTN (hypertension) 05/09/2010    Jerl Mina ,PT, DPT, E-RYT  05/31/2015, 6:31 PM  [place] MAIN [facility] SERVICES [address] [place], [place], [address] Phone: [phone]   Fax:  [phone]  Name: Ysidra Kano MRN: [id number] Date of Birth: 1940/11/03

## 2015-06-07 ENCOUNTER — Encounter: Payer: PPO | Admitting: Physical Therapy

## 2015-06-07 ENCOUNTER — Ambulatory Visit: Payer: PPO | Admitting: Physical Therapy

## 2015-06-07 DIAGNOSIS — M791 Myalgia, unspecified site: Secondary | ICD-10-CM

## 2015-06-07 DIAGNOSIS — R2689 Other abnormalities of gait and mobility: Secondary | ICD-10-CM | POA: Diagnosis not present

## 2015-06-07 DIAGNOSIS — R293 Abnormal posture: Secondary | ICD-10-CM

## 2015-06-07 NOTE — Patient Instructions (Signed)
Sidestep R + mini squat  20 x 3 x day   Mini squat 10 x 3 x 3    Walking/ standing with feet under hips, wide, thighs out, more weight on pinky toe side of the foot

## 2015-06-08 NOTE — Therapy (Signed)
[place] MAIN [facility] SERVICES [address] [place], [place], [address] Phone: [phone]   Fax:  [phone]  Physical Therapy Treatment   Patient Details  Name: Meagan Cox MRN: [id number] Date of Birth: [birth year] Referring Provider: Derrel Nip MD  Encounter Date: 06/07/2015      PT End of Session - 06/08/15 0820    Visit Number 4   Number of Visits 12   Date for PT Re-Evaluation 08/03/15   Authorization Type 3 of 10 G-Code   PT Start Time 1017   PT Stop Time 1105   PT Time Calculation (min) 48 min   Activity Tolerance Patient tolerated treatment well;No increased pain   Behavior During Therapy [facility] for tasks assessed/performed      Past Medical History  Diagnosis Date  . Hypertension   . Hyperlipidemia   . Hypothyroidism     Past Surgical History  Procedure Laterality Date  . Cardiac catheterization      30 years ago, [place]  . Tonsillectomy    . Partial hysterectomy    . Abdominal hysterectomy      There were no vitals filed for this visit.      Subjective Assessment - 06/08/15 0815    Subjective Pt has not had L radiating pain for the past 2 weeks. Pt continues to have the R medial thigh pain. Pt has tried to perform her hip ROM/ deep core exercises but she is unable to abduct/ER her R thigh very far.    Pertinent History R medial thigh pain: Pt has trouble sleeping  on L side due to L arm pain  and tries to sleep on her back and belly.  Pt has difficulty sleeping on R  side due to R hip bursitis. Hx of R knee issues (meniscus "slipping out out place" and chriporactor helps to realign). Resolved for the past 1-2 years.   Gynecological: 3 vaginal birth,  without perineal trauma.  SUI . Denied saddle signs.  Pt travels to [place] 2x a year. Pt will be leaving in August for her next trip.     Patient Stated Goals standing long periods at the airport and traveling with less pain. "Quit hurting."            [facility] PT  Assessment - 06/08/15 0819    Observation/Other Assessments   Observations pain with standing R hip add , w/ resistive band in sidestepping     Palpation   Palpation comment fascial restrictions only at adductor magnus (medial portion)                      [facility] Adult PT Treatment/Exercise - 06/08/15 0818    Neuro Re-ed    Neuro Re-ed Details  see pt instructions   Exercises   Exercises --  see pt instructions   Manual Therapy   Manual therapy comments fascial releases in adductor magnus R, manual lymph drainage abd, legs                      PT Long Term Goals - 06/08/15 0820    PT LONG TERM GOAL #1   Title Pt will decrease her ODI score from 24% to < 20% in order to travel on her trips to [place] and walk long distances on uneven surfaces.   Time 12   Period Weeks   Status Partially Met   PT LONG TERM GOAL #2   Title Pt  will decrease her PSQI score from 29% to < 24% in order to improve sleep quality and demonstrate ability to lay in a comfortable position.    Time 12   Period Weeks   Status Partially Met   PT LONG TERM GOAL #3   Title Pt will be complaint with water intake in order to promote bladder health and hydration for muscles for walking.    Time 12   Period Weeks   Status On-going   PT LONG TERM GOAL #4   Title Pt will demo no pelvic asymmetries and increased hip PROM IR bilaterally > 10 deg in order to improve gait mechanics for ambulation.    Time 12   Period Weeks   Status Achieved   PT LONG TERM GOAL #5   Title Pt will demo no pain with sidestepping L with yellow resistive band at thigh across 5 steps in order to perform exercises and to return to walking longer distances and maintain proper alignment of hips and LE.   Time 12   Period Weeks   Status New               Plan - 06/08/15 0815    Clinical Impression Statement Pt reports her remaining problem last week has been lateral R thigh pain. After Tx today, pt reported  75% improvement. Pt showed decreased fascial restriction and was able to porgress to R hip abduction closed chain strengthen without pain.  Plan to continue posture. gait training along with strengthening LE in order to minimize genu valgus. Pt's scar shows more mobility. Pt will continue to benefit from skilled PT.     Rehab Potential Good   PT Frequency 1x / week   PT Duration 12 weeks   PT Treatment/Interventions ADLs/Self Care Home Management;Aquatic Therapy;Gait training;Traction;Moist Heat;Stair training;Functional mobility training;Therapeutic activities;Therapeutic exercise;Balance training;Neuromuscular re-education;Electrical Stimulation;Cryotherapy;Manual techniques;Patient/family education;Passive range of motion;Dry needling;Energy conservation   Consulted and Agree with Plan of Care Patient      Patient will benefit from skilled therapeutic intervention in order to improve the following deficits and impairments:  Abnormal gait, Pain, Improper body mechanics, Postural dysfunction, Other (comment), Decreased mobility, Decreased coordination, Decreased activity tolerance, Decreased endurance, Decreased balance, Decreased safety awareness, Difficulty walking, Impaired flexibility, Decreased range of motion, Obesity, Decreased strength, Decreased scar mobility, Increased muscle spasms, Cardiopulmonary status limiting activity  Visit Diagnosis: Other abnormalities of gait and mobility  Myalgia  Poor posture     Problem List Patient Active Problem List   Diagnosis Date Noted  . Cerebrovascular small vessel disease 04/24/2015  . Benign paroxysmal positional vertigo 04/24/2015  . Pain in joint, pelvic region and thigh 04/24/2015  . Counseling for travel 01/11/2015  . Shoulder pain, right 11/22/2013  . Statin intolerance 10/08/2012  . Edema 05/14/2012  . Generalized anxiety disorder 01/06/2012  . 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 Hypothyroidism 12/18/2010  . Low back pain  radiating to both legs 12/18/2010  . Hip pain, right 12/18/2010  . Obesity 05/09/2010  . CAD (coronary artery disease) 05/09/2010  . Hyperlipidemia 05/09/2010  . HTN (hypertension) 05/09/2010    Jerl Mina ,PT, DPT, E-RYT  06/08/2015, 8:22 AM  [place] MAIN [facility] SERVICES [address] [place], [place], [address] Phone: [phone]   Fax:  [phone]  Name: Meagan Cox MRN: [id number] Date of Birth: 02/04/1940

## 2015-06-14 ENCOUNTER — Ambulatory Visit: Payer: PPO | Admitting: Physical Therapy

## 2015-06-14 DIAGNOSIS — R2689 Other abnormalities of gait and mobility: Secondary | ICD-10-CM | POA: Diagnosis not present

## 2015-06-14 DIAGNOSIS — R293 Abnormal posture: Secondary | ICD-10-CM

## 2015-06-14 DIAGNOSIS — M791 Myalgia, unspecified site: Secondary | ICD-10-CM

## 2015-06-15 NOTE — Therapy (Signed)
[place] MAIN [facility] SERVICES [address] [place], [place], [address] Phone: [phone]   Fax:  [phone]  Physical Therapy Treatment  Patient Details  Name: Meagan Cox MRN: [id number] Date of Birth: Apr 20, 1940 Referring Provider: Derrel Nip MD  Encounter Date: 06/14/2015      PT End of Session - 06/15/15 1025    Visit Number 4   Number of Visits 12   Date for PT Re-Evaluation 08/03/15   Authorization Type 4 of 10 G-Code   PT Start Time 1105   PT Stop Time 1145   PT Time Calculation (min) 40 min   Activity Tolerance Patient tolerated treatment well;No increased pain   Behavior During Therapy [facility] for tasks assessed/performed      Past Medical History  Diagnosis Date  . Hypertension   . Hyperlipidemia   . Hypothyroidism     Past Surgical History  Procedure Laterality Date  . Cardiac catheterization      30 years ago, [place]  . Tonsillectomy    . Partial hysterectomy    . Abdominal hysterectomy      There were no vitals filed for this visit.      Subjective Assessment - 06/15/15 1055    Subjective Pt reported she continues to only notice her leg giving out on her only 2x per week for the past 2 weeks which is an improvement from the frequent occurence daily prior to PT.   Pertinent History R medial thigh pain: Pt has trouble sleeping  on L side due to L arm pain  and tries to sleep on her back and belly.  Pt has difficulty sleeping on R  side due to R hip bursitis. Hx of R knee issues (meniscus "slipping out out place" and chriporactor helps to realign). Resolved for the past 1-2 years.   Gynecological: 3 vaginal birth,  without perineal trauma.  SUI . Denied saddle signs.  Pt travels to [place] 2x a year. Pt will be leaving in August for her next trip.     Patient Stated Goals standing long periods at the airport and traveling with less pain. "Quit hurting."                     Adult Aquatic Therapy -  06/15/15 1056    Aquatic Therapy Subjective   Subjective Pt had medial thigh pain with rising from mini squat and stairclimbing, and L sidestepping (hip add).  Sit to stand exercises and L sidestepping were discontinued. Stairclimbing out of the pool was modified with L LE leading.       O: O: Pt entered/exited the pool via steps with single UE support on rail. Exercises performed in 3'6" depth  50 ft =1 lap  2 laps: walking forward and backward   w/ blue pool floor for proprioception,  2 laps with dumbbells in hand under water  2 laps sidestepping R with feet propioception 30 sec with 3-way hip,  single UE support on wall x 3 each leg   Mini squat  were discontinued due to pain   SLS -Quad stretches on step Front mini lunges with isometric contraction for hip abd x 5 reps 30 sec holds   Pelvic neutral alignment cues    2 laps seated on noodles walking backwards                        PT Long Term Goals - 06/08/15 0820  PT LONG TERM GOAL #1   Title Pt will decrease her ODI score from 24% to < 20% in order to travel on her trips to [place] and walk long distances on uneven surfaces.   Time 12   Period Weeks   Status Partially Met   PT LONG TERM GOAL #2   Title Pt will decrease her PSQI score from 29% to < 24% in order to improve sleep quality and demonstrate ability to lay in a comfortable position.    Time 12   Period Weeks   Status Partially Met   PT LONG TERM GOAL #3   Title Pt will be complaint with water intake in order to promote bladder health and hydration for muscles for walking.    Time 12   Period Weeks   Status On-going   PT LONG TERM GOAL #4   Title Pt will demo no pelvic asymmetries and increased hip PROM IR bilaterally > 10 deg in order to improve gait mechanics for ambulation.    Time 12   Period Weeks   Status Achieved   PT LONG TERM GOAL #5   Title Pt will demo no pain with sidestepping L with yellow resistive band at thigh across 5  steps in order to perform exercises and to return to walking longer distances and maintain proper alignment of hips and LE.   Time 12   Period Weeks   Status New               Plan - 06/15/15 1051    Clinical Impression Statement Pt continues to show progress with strengthening of hip abduction / ext exercises. Pt demo'd improved pelvic/ LE kinetic chain propioception with deep core mm engaged to minimize anterior tilt of pelvis and genu valgus. Pt continues to report pain with rising from mini squat  and stairclimbing with RLE leading.  Pt will benefit from continued skilled PT.    Rehab Potential Good   PT Frequency 1x / week   PT Duration 12 weeks   PT Treatment/Interventions ADLs/Self Care Home Management;Aquatic Therapy;Gait training;Traction;Moist Heat;Stair training;Functional mobility training;Therapeutic activities;Therapeutic exercise;Balance training;Neuromuscular re-education;Electrical Stimulation;Cryotherapy;Manual techniques;Patient/family education;Passive range of motion;Dry needling;Energy conservation   Consulted and Agree with Plan of Care Patient      Patient will benefit from skilled therapeutic intervention in order to improve the following deficits and impairments:  Abnormal gait, Pain, Improper body mechanics, Postural dysfunction, Other (comment), Decreased mobility, Decreased coordination, Decreased activity tolerance, Decreased endurance, Decreased balance, Decreased safety awareness, Difficulty walking, Impaired flexibility, Decreased range of motion, Obesity, Decreased strength, Decreased scar mobility, Increased muscle spasms, Cardiopulmonary status limiting activity  Visit Diagnosis: Other abnormalities of gait and mobility  Myalgia  Poor posture     Problem List Patient Active Problem List   Diagnosis Date Noted  . Cerebrovascular small vessel disease 04/24/2015  . Benign paroxysmal positional vertigo 04/24/2015  . Pain in joint, pelvic region  and thigh 04/24/2015  . Counseling for travel 01/11/2015  . Shoulder pain, right 11/22/2013  . Statin intolerance 10/08/2012  . Edema 05/14/2012  . Generalized anxiety disorder 01/06/2012  . 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 Hypothyroidism 12/18/2010  . Low back pain radiating to both legs 12/18/2010  . Hip pain, right 12/18/2010  . Obesity 05/09/2010  . CAD (coronary artery disease) 05/09/2010  . Hyperlipidemia 05/09/2010  . HTN (hypertension) 05/09/2010    Jerl Mina ,PT, DPT, E-RYT  06/15/2015, 10:59 AM  [place] MAIN [facility] SERVICES  [place], [place], [address] Phone: [phone]   Fax:  [phone]  Name: Meagan Cox MRN: [id number] Date of Birth: Jun 27, 1940

## 2015-06-22 ENCOUNTER — Ambulatory Visit: Payer: PPO | Admitting: Physical Therapy

## 2015-06-22 DIAGNOSIS — R2689 Other abnormalities of gait and mobility: Secondary | ICD-10-CM

## 2015-06-22 DIAGNOSIS — R293 Abnormal posture: Secondary | ICD-10-CM

## 2015-06-22 DIAGNOSIS — M791 Myalgia, unspecified site: Secondary | ICD-10-CM

## 2015-06-22 NOTE — Patient Instructions (Addendum)
Red band' Ankle strengthening:  Make sure your foot is place in line with your hips Eversion (other foot holds the band) move ankle to away from midline  Plantarflexion (pressing the gfas pedal)  10 x 3 each ankle     Standing hip stretches for the thigh: Make sure your foot is place in line with your hips Toe touch back     Replace squats with monkey walking with yellow band at the thigh: Make sure your foot is place in line with your hips 5 mins in your house by the wall x 3 (take breaks 2.5 min x 2)    Deep strengthening: knee out 20 x / 2 x day   ___  Place insoles to raise your foot arch and wear shoes with proper sole support for the week

## 2015-06-23 NOTE — Therapy (Signed)
[place] MAIN [facility] SERVICES [address] [place], [place], [address] Phone: [phone]   Fax:  [phone]  Physical Therapy Treatment  Patient Details  Name: Meagan Cox MRN: [id number] Date of Birth: 1940/12/14 Referring Provider: Derrel Nip MD  Encounter Date: 06/22/2015      PT End of Session - 06/22/15 1108    Visit Number 5   Number of Visits 12   Date for PT Re-Evaluation 08/03/15   Authorization Type 5 of 10 G-Code   PT Start Time 1105   Activity Tolerance Patient tolerated treatment well;No increased pain   Behavior During Therapy [facility] for tasks assessed/performed      Past Medical History  Diagnosis Date  . Hypertension   . Hyperlipidemia   . Hypothyroidism     Past Surgical History  Procedure Laterality Date  . Cardiac catheterization      30 years ago, [place]  . Tonsillectomy    . Partial hysterectomy    . Abdominal hysterectomy      There were no vitals filed for this visit.      Subjective Assessment - 06/22/15 1108    Subjective Pt felt good after the pool therapy session for 3 days. Pt  decided to wean off of her muscle relaxer medication on the 4th day after therapy. Since then pt has felt increased pain with interruption of sleep. Pt has reported that previous PT has helped her pain to decrease to a "dull roar" and the leg only "gives out on her" periodically compared to daily. Without medications for the past 4  days, the leg is only given out on her a "little bit".  But she noticed her lower back pain has returned but not to the extent that it was. Pt was not able to provide VAS scale comparisons of the pain differences nor % change when asked. 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       Pertinent History R medial thigh pain: Pt has trouble sleeping  on L side due to L arm pain  and tries to sleep on her back and belly.  Pt has difficulty sleeping on R  side due to R  hip bursitis. Hx of R knee issues (meniscus "slipping out out place" and chriporactor helps to realign). Resolved for the past 1-2 years.   Gynecological: 3 vaginal birth,  without perineal trauma.  SUI . Denied saddle signs.  Pt travels to [place] 2x a year. Pt will be leaving in August for her next trip.     Patient Stated Goals standing long periods at the airport and traveling with less pain. "Quit hurting."            [facility] PT Assessment - 06/23/15 1658    Other:   Other/Comments mini squat with p!, gait training with less genu valgus (band at thigh) no p!   Palpation   Palpation comment fascial restriction minor, pt with less p! in hip IR/ ER ~5 deg with abdominal / thigh fascial pulled externally    Ambulation/Gait   Gait Comments pronation, shoes with poor arch support.                       [facility] Adult PT Treatment/Exercise - 06/23/15 1658    Therapeutic Activites    Therapeutic Activities --  see pt instructions   Neuro Re-ed    Neuro Re-ed Details  see  pt instructions   Kinesiotix   Facilitate Muscle  medial to lateral over lower quadrant of abdomen, and thigh R                      PT Long Term Goals - 06/08/15 0820    PT LONG TERM GOAL #1   Title Pt will decrease her ODI score from 24% to < 20% in order to travel on her trips to [place] and walk long distances on uneven surfaces.   Time 12   Period Weeks   Status Partially Met   PT LONG TERM GOAL #2   Title Pt will decrease her PSQI score from 29% to < 24% in order to improve sleep quality and demonstrate ability to lay in a comfortable position.    Time 12   Period Weeks   Status Partially Met   PT LONG TERM GOAL #3   Title Pt will be complaint with water intake in order to promote bladder health and hydration for muscles for walking.    Time 12   Period Weeks   Status On-going   PT LONG TERM GOAL #4   Title Pt will demo no pelvic asymmetries and increased hip PROM IR bilaterally > 10  deg in order to improve gait mechanics for ambulation.    Time 12   Period Weeks   Status Achieved   PT LONG TERM GOAL #5   Title Pt will demo no pain with sidestepping L with yellow resistive band at thigh across 5 steps in order to perform exercises and to return to walking longer distances and maintain proper alignment of hips and LE.   Time 12   Period Weeks   Status New               Plan - 06/23/15 1702    Clinical Impression Statement Pt showed significantly decreased fascial restrictions over R groin/thigh compared previous sessions. Pt demo'd guarding with R hip IR/ER and restricted PROM without pain medication. Pt continued to tolerate a wider range gradually with skin stretching with kinesiotape from medial to lateral direction over R  lower abdomen/thigh. Progressed pt with resisted hip abduction walking with cues for less genu valgus and added ankle strengthening to minimize pronation. Pt was educated on proper shoe wear and to activate deep core mm to minimize anterior tilt of the pelvis tightening of R groin area. Pt continues to progress well towards her goals.    Rehab Potential Good   PT Frequency 1x / week   PT Duration 12 weeks   PT Treatment/Interventions ADLs/Self Care Home Management;Aquatic Therapy;Gait training;Traction;Moist Heat;Stair training;Functional mobility training;Therapeutic activities;Therapeutic exercise;Balance training;Neuromuscular re-education;Electrical Stimulation;Cryotherapy;Manual techniques;Patient/family education;Passive range of motion;Dry needling;Energy conservation   Consulted and Agree with Plan of Care Patient      Patient will benefit from skilled therapeutic intervention in order to improve the following deficits and impairments:  Abnormal gait, Pain, Improper body mechanics, Postural dysfunction, Other (comment), Decreased mobility, Decreased coordination, Decreased activity tolerance, Decreased endurance, Decreased balance,  Decreased safety awareness, Difficulty walking, Impaired flexibility, Decreased range of motion, Obesity, Decreased strength, Decreased scar mobility, Increased muscle spasms, Cardiopulmonary status limiting activity  Visit Diagnosis: Other abnormalities of gait and mobility  Myalgia  Poor posture     Problem List Patient Active Problem List   Diagnosis Date Noted  . Cerebrovascular small vessel disease 04/24/2015  . Benign paroxysmal positional vertigo 04/24/2015  . Pain in joint, pelvic region and thigh 04/24/2015  .  Counseling for travel 01/11/2015  . Shoulder pain, right 11/22/2013  . Statin intolerance 10/08/2012  . Edema 05/14/2012  . Generalized anxiety disorder 01/06/2012  . 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 Hypothyroidism 12/18/2010  . Low back pain radiating to both legs 12/18/2010  . Hip pain, right 12/18/2010  . Obesity 05/09/2010  . CAD (coronary artery disease) 05/09/2010  . Hyperlipidemia 05/09/2010  . HTN (hypertension) 05/09/2010    Jerl Mina ,PT, DPT, E-RYT  06/23/2015, 5:10 PM  [place] MAIN [facility] SERVICES [address] [place], [place], [address] Phone: [phone]   Fax:  [phone]  Name: Nyanna Heideman MRN: [id number] Date of Birth: March 28, 1940

## 2015-06-28 ENCOUNTER — Ambulatory Visit: Payer: PPO | Admitting: Physical Therapy

## 2015-06-28 DIAGNOSIS — M791 Myalgia, unspecified site: Secondary | ICD-10-CM

## 2015-06-28 DIAGNOSIS — R2689 Other abnormalities of gait and mobility: Secondary | ICD-10-CM

## 2015-06-28 DIAGNOSIS — R293 Abnormal posture: Secondary | ICD-10-CM

## 2015-06-29 ENCOUNTER — Ambulatory Visit: Payer: PPO | Admitting: Physical Therapy

## 2015-06-29 DIAGNOSIS — R293 Abnormal posture: Secondary | ICD-10-CM

## 2015-06-29 DIAGNOSIS — R2689 Other abnormalities of gait and mobility: Secondary | ICD-10-CM

## 2015-06-29 DIAGNOSIS — M791 Myalgia, unspecified site: Secondary | ICD-10-CM

## 2015-06-29 NOTE — Therapy (Addendum)
[place] MAIN [facility] SERVICES [address] [place], [place], [address] Phone: [phone]   Fax:  [phone]  Physical Therapy Treatment  Patient Details  Name: Meagan Cox MRN: [id number] Date of Birth: 16-Apr-1940 Referring Provider: Derrel Nip MD  Encounter Date: 06/28/2015      PT End of Session - 06/29/15 1313    Visit Number 6   Date for PT Re-Evaluation 08/03/15   Authorization Type 6 of 10 G-Code   PT Start Time 1620   PT Stop Time 1725   PT Time Calculation (min) 65 min   Activity Tolerance Patient tolerated treatment well;No increased pain   Behavior During Therapy [facility] for tasks assessed/performed      Past Medical History  Diagnosis Date  . Hypertension   . Hyperlipidemia   . Hypothyroidism     Past Surgical History  Procedure Laterality Date  . Cardiac catheterization      30 years ago, [place]  . Tonsillectomy    . Partial hysterectomy    . Abdominal hysterectomy      There were no vitals filed for this visit.      Subjective Assessment - 06/28/15 1629    Subjective Pt reported after last session, pt walked at the gym the next day for 1 mile on te treadmill  without medication. Pt noticed the leg gave out on her during walk and the leg felt "real sore" which lead her to take her medication. The following days pt has noticed her leg has given out on her 5-6x /day.  Pt can't figure it because she has not done anything unsual. Pt has started to wear her tennis shoes.     Pertinent History R medial thigh pain: Pt has trouble sleeping  on L side due to L arm pain  and tries to sleep on her back and belly.  Pt has difficulty sleeping on R  side due to R hip bursitis. Hx of R knee issues (meniscus "slipping out out place" and chriporactor helps to realign). Resolved for the past 1-2 years.   Gynecological: 3 vaginal birth,  without perineal trauma.  SUI . Denied saddle signs.  Pt travels to [place] 2x a year. Pt will  be leaving in August for her next trip.     Patient Stated Goals standing long periods at the airport and traveling with less pain. "Quit hurting."            [facility] PT Assessment - 06/29/15 1308    AROM   Overall AROM Comments hip flexion R, alight hip add/IR limited by p!     PROM   Overall PROM Comments R hip IR (~5 deg post Tx with less pain) , hip IR/ ER in supine (~10 deg ER limited by p!)    Palpation   Palpation comment increased fascial restrictions along R pubic rami, inside R iliac crest   no fascial restrictions noted R adductors    Ambulation/Gait   Gait Comments antalgic (pre Tx), normal gait pattern (post Tx)                     [facility] Adult PT Treatment/Exercise - 06/29/15 1308    Manual Therapy   Manual therapy comments fascial releases over problem areas stated in under "assessment section"                 PT Education - 06/29/15 1312    Education provided Yes   Education  Details HEP   Person(s) Educated Patient   Comprehension Verbalized understanding             PT Long Term Goals - 06/08/15 0820    PT LONG TERM GOAL #1   Title Pt will decrease her ODI score from 24% to < 20% in order to travel on her trips to [place] and walk long distances on uneven surfaces.   Time 12   Period Weeks   Status Partially Met   PT LONG TERM GOAL #2   Title Pt will decrease her PSQI score from 29% to < 24% in order to improve sleep quality and demonstrate ability to lay in a comfortable position.    Time 12   Period Weeks   Status Partially Met   PT LONG TERM GOAL #3   Title Pt will be complaint with water intake in order to promote bladder health and hydration for muscles for walking.    Time 12   Period Weeks   Status On-going   PT LONG TERM GOAL #4   Title Pt will demo no pelvic asymmetries and increased hip PROM IR bilaterally > 10 deg in order to improve gait mechanics for ambulation.    Time 12   Period Weeks   Status Achieved   PT  LONG TERM GOAL #5   Title Pt will demo no pain with sidestepping L with yellow resistive band at thigh across 5 steps in order to perform exercises and to return to walking longer distances and maintain proper alignment of hips and LE.   Time 12   Period Weeks   Status New               Plan - 06/29/15 1314    Clinical Impression Statement Pt reported a decrease in pain from 7/10 to 3/10 in her RLE. Pt showed a significant decrease in fascial restrictions  over adductor triangle of her RLE and demo'd increased PROM hip IR (hip flexed at 90 deg) and hip IR/ ER following manual Tx.  Pt 's antalgic gait also resolved as well. Pt showed compliance with wearing shoes with better sole support and showed less pronation. Pt continues to have limited hip add/IR which will be addressed at next session. Pt continues to benefit from skilled PT and she is progressing well towards her goals.     Rehab Potential Good   PT Frequency 1x / week   PT Duration 12 weeks   PT Treatment/Interventions ADLs/Self Care Home Management;Aquatic Therapy;Gait training;Traction;Moist Heat;Stair training;Functional mobility training;Therapeutic activities;Therapeutic exercise;Balance training;Neuromuscular re-education;Electrical Stimulation;Cryotherapy;Manual techniques;Patient/family education;Passive range of motion;Dry needling;Energy conservation   Consulted and Agree with Plan of Care Patient      Patient will benefit from skilled therapeutic intervention in order to improve the following deficits and impairments:  Abnormal gait, Pain, Improper body mechanics, Postural dysfunction, Other (comment), Decreased mobility, Decreased coordination, Decreased activity tolerance, Decreased endurance, Decreased balance, Decreased safety awareness, Difficulty walking, Impaired flexibility, Decreased range of motion, Obesity, Decreased strength, Decreased scar mobility, Increased muscle spasms, Cardiopulmonary status limiting  activity  Visit Diagnosis: Other abnormalities of gait and mobility  Myalgia  Poor posture     Problem List Patient Active Problem List   Diagnosis Date Noted  . Cerebrovascular small vessel disease 04/24/2015  . Benign paroxysmal positional vertigo 04/24/2015  . Pain in joint, pelvic region and thigh 04/24/2015  . Counseling for travel 01/11/2015  . Shoulder pain, right 11/22/2013  . Statin intolerance 10/08/2012  . Edema 05/14/2012  .  Generalized anxiety disorder 01/06/2012  . 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 Hypothyroidism 12/18/2010  . Low back pain radiating to both legs 12/18/2010  . Hip pain, right 12/18/2010  . Obesity 05/09/2010  . CAD (coronary artery disease) 05/09/2010  . Hyperlipidemia 05/09/2010  . HTN (hypertension) 05/09/2010    Jerl Mina ,PT, DPT, E-RYT  06/29/2015, 1:19 PM  [place] MAIN [facility] SERVICES [address] [place], [place], [address] Phone: [phone]   Fax:  [phone]  Name: Dyllan Kats MRN: [id number] Date of Birth: January 22, 1941

## 2015-06-29 NOTE — Therapy (Addendum)
[place] MAIN [facility] SERVICES [address] [place], [place], [address] Phone: [phone]   Fax:  [phone]  Physical Therapy Treatment  Patient Details  Name: Meagan Cox MRN: [id number] Date of Birth: [birth year] Referring Provider: Derrel Nip MD  Encounter Date: 06/29/2015      PT End of Session - 07/05/15 0820    Visit Number 7   Number of Visits 12   Date for PT Re-Evaluation 08/03/15   Authorization Type 7 og g-code   Activity Tolerance Patient tolerated treatment well   Behavior During Therapy [facility] for tasks assessed/performed      Past Medical History  Diagnosis Date  . Hypertension   . Hyperlipidemia   . Hypothyroidism     Past Surgical History  Procedure Laterality Date  . Cardiac catheterization      30 years ago, [place]  . Tonsillectomy    . Partial hysterectomy    . Abdominal hysterectomy      There were no vitals filed for this visit.      Subjective Assessment - 06/28/15 1629    Subjective Pt reported she has not felt the pain since yesterday's session and she hopes it will stay that way over her upcoming trip to [place] this week.   Pertinent History R medial thigh pain: Pt has trouble sleeping  on L side due to L arm pain  and tries to sleep on her back and belly.  Pt has difficulty sleeping on R  side due to R hip bursitis. Hx of R knee issues (meniscus "slipping out out place" and chriporactor helps to realign). Resolved for the past 1-2 years.   Gynecological: 3 vaginal birth,  without perineal trauma.  SUI . Denied saddle signs.  Pt travels to [place] 2x a year. Pt will be leaving in August for her next trip.     Patient Stated Goals standing long periods at the airport and traveling with less pain. "Quit hurting."            [facility] PT Assessment - 06/29/15 1308    AROM   Overall AROM Comments hip flexion R, alight hip add/IR limited by p!     PROM   Overall PROM Comments R hip IR (~5 deg post  Tx with less pain) , hip IR/ ER in supine (~10 deg ER limited by p!)    Palpation   Palpation comment increased fascial restrictions along R pubic rami, inside R iliac crest   no fascial restrictions noted R adductors    Ambulation/Gait   Gait Comments antalgic (pre Tx), normal gait pattern (post Tx)                     [facility] Adult PT Treatment/Exercise - 07/05/15 0819    Manual Therapy   Manual therapy comments fascial releases over problem areas stated in under "assessment section"                      PT Long Term Goals - 07/04/15 1310    PT LONG TERM GOAL #1   Title Pt will decrease her ODI score from 24% to < 20% in order to travel on her trips to [place] and walk long distances on uneven surfaces.   Time 12   Period Weeks   Status Partially Met   PT LONG TERM GOAL #2   Title Pt will decrease her PSQI score from 29% to < 24% in order  to improve sleep quality and demonstrate ability to lay in a comfortable position.    Time 12   Period Weeks   Status Partially Met   PT LONG TERM GOAL #3   Title Pt will be complaint with water intake in order to promote bladder health and hydration for muscles for walking.    Time 12   Period Weeks   Status On-going   PT LONG TERM GOAL #4   Title Pt will demo no pelvic asymmetries and increased hip PROM IR bilaterally > 10 deg in order to improve gait mechanics for ambulation.    Time 12   Period Weeks   Status Achieved   PT LONG TERM GOAL #5   Title Pt will demo no pain with sidestepping L with yellow resistive band at thigh across 5 steps in order to perform exercises and to return to walking longer distances and maintain proper alignment of hips and LE.   Time 12   Period Weeks   Status New               Plan - 07/05/15 2080   Clinical Impression: Pt showed no antalgic gait post-manual Tx today. Pt gained more hip IR/ER ROM but had limited hip flexion/add/IR. However, she demo'd no genu valgus with sit  to stand. Pt reported feeling better in her RLE at the end of the session. Pt showed compliance with foot wear with proper shoe support. Downsized HEP because pt leaves for an out of town trip. Pt continues to require skilled PT to achieve her goals.    Rehab Potential Good   PT Frequency 1x / week   PT Duration 12 weeks   PT Treatment/Interventions ADLs/Self Care Home Management;Aquatic Therapy;Gait training;Traction;Moist Heat;Stair training;Functional mobility training;Therapeutic activities;Therapeutic exercise;Balance training;Neuromuscular re-education;Electrical Stimulation;Cryotherapy;Manual techniques;Patient/family education;Passive range of motion;Dry needling;Energy conservation   Consulted and Agree with Plan of Care Patient      Patient will benefit from skilled therapeutic intervention in order to improve the following deficits and impairments:  Abnormal gait, Pain, Improper body mechanics, Postural dysfunction, Other (comment), Decreased mobility, Decreased coordination, Decreased activity tolerance, Decreased endurance, Decreased balance, Decreased safety awareness, Difficulty walking, Impaired flexibility, Decreased range of motion, Obesity, Decreased strength, Decreased scar mobility, Increased muscle spasms, Cardiopulmonary status limiting activity  Visit Diagnosis: Other abnormalities of gait and mobility  Myalgia  Poor posture     Problem List Patient Active Problem List   Diagnosis Date Noted  . Cerebrovascular small vessel disease 04/24/2015  . Benign paroxysmal positional vertigo 04/24/2015  . Pain in joint, pelvic region and thigh 04/24/2015  . Counseling for travel 01/11/2015  . Shoulder pain, right 11/22/2013  . Statin intolerance 10/08/2012  . Edema 05/14/2012  . Generalized anxiety disorder 01/06/2012  . 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 Hypothyroidism 12/18/2010  . Low back pain radiating to both legs 12/18/2010  . Hip pain, right 12/18/2010  .  Obesity 05/09/2010  . CAD (coronary artery disease) 05/09/2010  . Hyperlipidemia 05/09/2010  . HTN (hypertension) 05/09/2010    Jerl Mina ,PT, DPT, E-RYT  07/05/2015, 8:24 AM  [place] MAIN [facility] SERVICES [address] [place], [place], [address] Phone: [phone]   Fax:  [phone]  Name: Villa Burgin MRN: [id number] Date of Birth: 02/17/1940

## 2015-06-29 NOTE — Patient Instructions (Signed)
3 exercises this week + stretches   1. Continue with Deep core knee out exercise 30 reps x 2    2. Butt lifts (bridge) with red band at thigh  10 reps x 3     3. Mini squats  (make sure your knees are behind your toes)  10 x 3 different times in day

## 2015-07-04 ENCOUNTER — Ambulatory Visit: Payer: PPO | Attending: Internal Medicine | Admitting: Physical Therapy

## 2015-07-04 DIAGNOSIS — R293 Abnormal posture: Secondary | ICD-10-CM | POA: Insufficient documentation

## 2015-07-04 DIAGNOSIS — M791 Myalgia, unspecified site: Secondary | ICD-10-CM

## 2015-07-04 DIAGNOSIS — R2689 Other abnormalities of gait and mobility: Secondary | ICD-10-CM | POA: Diagnosis not present

## 2015-07-05 NOTE — Therapy (Signed)
[place] MAIN [facility] SERVICES [address] [place], [place], [address] Phone: [phone]   Fax:  [phone]  Physical Therapy Treatment  Patient Details  Name: Meagan Cox MRN: [id number] Date of Birth: [birth year] Referring Provider: Derrel Nip MD  Encounter Date: 07/04/2015      PT End of Session - 07/05/15 0831    Visit Number 7   Number of Visits 12   Date for PT Re-Evaluation 08/03/15   Authorization Type 7 of g-code   PT Start Time 1103   PT Stop Time 1210   PT Time Calculation (min) 67 min   Activity Tolerance Patient tolerated treatment well   Behavior During Therapy [facility] for tasks assessed/performed      Past Medical History  Diagnosis Date  . Hypertension   . Hyperlipidemia   . Hypothyroidism     Past Surgical History  Procedure Laterality Date  . Cardiac catheterization      30 years ago, [place]  . Tonsillectomy    . Partial hysterectomy    . Abdominal hysterectomy      There were no vitals filed for this visit.      Subjective Assessment - 07/05/15 0827    Subjective Pt reported no problem after last session but the day following treatment, pt encountered problems due to increased sitting on hard metal bleachers at  her grandson's graduation in addition to long car rides. Her leg gave out on her several times. She feels sore in her buttocks from sitting on hard surfaces.    Pertinent History R medial thigh pain: Pt has trouble sleeping  on L side due to L arm pain  and tries to sleep on her back and belly.  Pt has difficulty sleeping on R  side due to R hip bursitis. Hx of R knee issues (meniscus "slipping out out place" and chriporactor helps to realign). Resolved for the past 1-2 years.   Gynecological: 3 vaginal birth,  without perineal trauma.  SUI . Denied saddle signs.  Pt travels to [place] 2x a year. Pt will be leaving in August for her next trip.     Patient Stated Goals [place] periods [place] the  air[place] and traveling with less pain. "Quit hurting."            [facility] PT Assessment - 07/05/15 0827    Observation/Other Assessments   Observations no genu valgus with sit to stand   AROM   Overall AROM Comments R hip ext to ~20 deg but w/pain (pre Tx), no pain post Tx   PROM   Overall PROM Comments increased Hip IR /ER in supine, and hip flex/add/IR  post Tx   Palpation   Palpation comment increased fascial restriction along R side of abdominal scar                   Pelvic Floor Special Questions - 07/05/15 7494    Pelvic Floor Internal Exam pt verbally consented without contraindications   Exam Type Vaginal   Palpation initally w/ tenderness at iliococcygeus B, no tenderness after cue for pelvic neutral  no mm tensions noted           [facility] Adult PT Treatment/Exercise - 07/05/15 0830    Therapeutic Activites    Therapeutic Activities --  reassessed pelvic floor mm   Manual Therapy   Manual therapy comments manual lymph drainage over abdomen, fascial releases over scar  PT Long Term Goals - 07/04/15 1310    PT LONG TERM GOAL #1   Title Pt will decrease her ODI score from 24% to < 20% in order to travel on her trips to [place] and walk long distances on uneven surfaces.   Time 12   Period Weeks   Status Partially Met   PT LONG TERM GOAL #2   Title Pt will decrease her PSQI score from 29% to < 24% in order to improve sleep quality and demonstrate ability to lay in a comfortable position.    Time 12   Period Weeks   Status Partially Met   PT LONG TERM GOAL #3   Title Pt will be complaint with water intake in order to promote bladder health and hydration for muscles for walking.    Time 12   Period Weeks   Status On-going   PT LONG TERM GOAL #4   Title Pt will demo no pelvic asymmetries and increased hip PROM IR bilaterally > 10 deg in order to improve gait mechanics for ambulation.    Time 12   Period Weeks   Status  Achieved   PT LONG TERM GOAL #5   Title Pt will demo no pain with sidestepping L with yellow resistive band at thigh across 5 steps in order to perform exercises and to return to walking longer distances and maintain proper alignment of hips and LE.   Time 12   Period Weeks   Status New               Plan - 07/05/15 0831    Clinical Impression Statement Pt showed no pelvic floor mm tensions and significantly decreased fascial restrictions over R groin area today. Pt achieved hip ext on RLE to end range with pain following manual Tx.  Pt continues to show normal gait with good carry over with wide BOS and no genu valgus w/ sit to stand.  Pt continues to progress towards her goals.    Rehab Potential Good   PT Frequency 1x / week   PT Duration 12 weeks   PT Treatment/Interventions ADLs/Self Care Home Management;Aquatic Therapy;Gait training;Traction;Moist Heat;Stair training;Functional mobility training;Therapeutic activities;Therapeutic exercise;Balance training;Neuromuscular re-education;Electrical Stimulation;Cryotherapy;Manual techniques;Patient/family education;Passive range of motion;Dry needling;Energy conservation   Consulted and Agree with Plan of Care Patient      Patient will benefit from skilled therapeutic intervention in order to improve the following deficits and impairments:  Abnormal gait, Pain, Improper body mechanics, Postural dysfunction, Other (comment), Decreased mobility, Decreased coordination, Decreased activity tolerance, Decreased endurance, Decreased balance, Decreased safety awareness, Difficulty walking, Impaired flexibility, Decreased range of motion, Obesity, Decreased strength, Decreased scar mobility, Increased muscle spasms, Cardiopulmonary status limiting activity  Visit Diagnosis: Other abnormalities of gait and mobility  Myalgia  Poor posture     Problem List Patient Active Problem List   Diagnosis Date Noted  . Cerebrovascular small vessel  disease 04/24/2015  . Benign paroxysmal positional vertigo 04/24/2015  . Pain in joint, pelvic region and thigh 04/24/2015  . Counseling for travel 01/11/2015  . Shoulder pain, right 11/22/2013  . Statin intolerance 10/08/2012  . Edema 05/14/2012  . Generalized anxiety disorder 01/06/2012  . 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 Hypothyroidism 12/18/2010  . Low back pain radiating to both legs 12/18/2010  . Hip pain, right 12/18/2010  . Obesity 05/09/2010  . CAD (coronary artery disease) 05/09/2010  . Hyperlipidemia 05/09/2010  . HTN (hypertension) 05/09/2010    Jerl Mina ,PT, DPT, E-RYT  07/05/2015,  8:35 AM  [place] MAIN [facility] SERVICES [address] [place], [place], [address] Phone: [phone]   Fax:  [phone]  Name: Meagan Cox MRN: [id number] Date of Birth: 1940-03-05

## 2015-07-13 ENCOUNTER — Ambulatory Visit: Payer: PPO | Admitting: Physical Therapy

## 2015-07-13 DIAGNOSIS — R2689 Other abnormalities of gait and mobility: Secondary | ICD-10-CM | POA: Diagnosis not present

## 2015-07-13 DIAGNOSIS — M79672 Pain in left foot: Secondary | ICD-10-CM | POA: Diagnosis not present

## 2015-07-13 DIAGNOSIS — R293 Abnormal posture: Secondary | ICD-10-CM

## 2015-07-13 DIAGNOSIS — M791 Myalgia, unspecified site: Secondary | ICD-10-CM

## 2015-07-13 NOTE — Therapy (Signed)
[place] MAIN [facility] SERVICES [address] [place], [place], [address] Phone: [phone]   Fax:  [phone]  Physical Therapy Treatment  Patient Details  Name: Meagan Cox MRN: [id number] Date of Birth: 07-14-1940 Referring Provider: Derrel Nip MD  Encounter Date: 07/13/2015      PT End of Session - 07/13/15 2208    Visit Number 8   Number of Visits 12   Date for PT Re-Evaluation 08/03/15   Authorization Type 8    PT Start Time 0808   PT Stop Time 0905   PT Time Calculation (min) 57 min   Activity Tolerance Patient tolerated treatment well;No increased pain   Behavior During Therapy [facility] for tasks assessed/performed      Past Medical History  Diagnosis Date  . Hypertension   . Hyperlipidemia   . Hypothyroidism     Past Surgical History  Procedure Laterality Date  . Cardiac catheterization      30 years ago, [place]  . Tonsillectomy    . Partial hysterectomy    . Abdominal hysterectomy      There were no vitals filed for this visit.      Subjective Assessment - 07/13/15 0909    Subjective Pt reported she had only 2 instances where her R hip "gave out" on her across the past week. Pt feels she is getting used to the hip not hurting as much. But when the catching of the hip occurs, pt feels it takes her breath away.             [facility] PT Assessment - 07/13/15 1040    Other:   Other/Comments body turns in standing occured w/ genu valgus    AROM   Overall AROM Comments pre-Tx: hip flexion w/o slight ER caused p! in sidelying, post Tx no p!   PROM   Overall PROM Comments increased Hip IR /ER in supine, and hip flex/add/IR  post Tx   Palpation   Palpation comment fascial restictions localized at inguinal canal R                      [facility] Adult PT Treatment/Exercise - 07/13/15 1042    Therapeutic Activites    Therapeutic Activities --  turning body 180 deg or stepping backward in stance    Neuro  Re-ed    Neuro Re-ed Details  body turns with movement of feet first, full body rotation to object of attention instead of half body turns w/ stationary feet   Manual Therapy   Manual therapy comments manual lymph drainage over abdomen, fascial releases over R inguinal area ,                 PT Education - 07/13/15 1103    Education provided Yes   Education Details HEP   Person(s) Educated Patient   Methods Explanation;Demonstration;Tactile cues;Verbal cues;Handout   Comprehension Returned demonstration;Verbalized understanding             PT Long Term Goals - 07/13/15 1104    PT LONG TERM GOAL #1   Title Pt will decrease her ODI score from 24% to < 20% in order to travel on her trips to [place] and walk long distances on uneven surfaces.   Time 12   Period Weeks   Status Partially Met   PT LONG TERM GOAL #2   Title Pt will decrease her PSQI score from 29% to < 24% in order to improve  sleep quality and demonstrate ability to lay in a comfortable position.    Time 12   Period Weeks   Status Partially Met   PT LONG TERM GOAL #3   Title Pt will be complaint with water intake in order to promote bladder health and hydration for muscles for walking.    Time 12   Period Weeks   Status On-going   PT LONG TERM GOAL #4   Title Pt will demo no pelvic asymmetries and increased hip PROM IR bilaterally > 10 deg in order to improve gait mechanics for ambulation.    Time 12   Period Weeks   Status Achieved   PT LONG TERM GOAL #5   Title Pt will demo no pain with sidestepping L with yellow resistive band at thigh across 5 steps in order to perform exercises and to return to walking longer distances and maintain proper alignment of hips and LE.   Time 12   Period Weeks   Status On-going               Plan - 07/13/15 2208    Clinical Impression Statement Pt continues to make progress with decreased incidents when she felt her leg giving out on her, now 2x/ week instead  of everyday. Pt showed less genu valgus in gait and sit to stand t/f which indicates increased motor control and hip/core strength. Her fascial restrictions have decreased to a smaller area now located at inguinal crease and no longer widespread over medial thigh and suprapubic area. Pt's abdominal scar restrictions have decreased signficantly,  pt's hip ext on AROM has increase to normal range without pain. Today pt had decreased pain with hip flexion when cued to keep RLE in  slightly ER  in sidelying position and was educated to turn her body when standing by moving her feet first to avoid genu valgus or excessive R hip IR/adduction. Pt  demo'd technique correctly and voiced understanding. Pt will continue to benefit from continued skilled PT 2x /week to achieve her goals and to continue strengthening her hip/LE/ and deep core mm. Pt agreed to use aquatic therapy once a week in addition to 1 land appt within the same week in order to be ready for d/c before [place].      Rehab Potential Good   PT Frequency 2x / week   PT Duration 8 weeks   PT Treatment/Interventions ADLs/Self Care Home Management;Aquatic Therapy;Gait training;Traction;Moist Heat;Stair training;Functional mobility training;Therapeutic activities;Therapeutic exercise;Balance training;Neuromuscular re-education;Electrical Stimulation;Cryotherapy;Manual techniques;Patient/family education;Passive range of motion;Dry needling;Energy conservation   Consulted and Agree with Plan of Care Patient      Patient will benefit from skilled therapeutic intervention in order to improve the following deficits and impairments:  Abnormal gait, Pain, Improper body mechanics, Postural dysfunction, Other (comment), Decreased mobility, Decreased coordination, Decreased activity tolerance, Decreased endurance, Decreased balance, Decreased safety awareness, Difficulty walking, Impaired flexibility, Decreased range of motion, Obesity, Decreased strength,  Decreased scar mobility, Increased muscle spasms, Cardiopulmonary status limiting activity  Visit Diagnosis: Other abnormalities of gait and mobility - Plan: PT plan of care cert/re-cert  Myalgia - Plan: PT plan of care cert/re-cert  Poor posture - Plan: PT plan of care cert/re-cert     Problem List Patient Active Problem List   Diagnosis Date Noted  . Cerebrovascular small vessel disease 04/24/2015  . Benign paroxysmal positional vertigo 04/24/2015  . Pain in joint, pelvic region and thigh 04/24/2015  . Counseling for travel 01/11/2015  . Shoulder pain,  right 11/22/2013  . Statin intolerance 10/08/2012  . Edema 05/14/2012  . Generalized anxiety disorder 01/06/2012  . 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 Hypothyroidism 12/18/2010  . Low back pain radiating to both legs 12/18/2010  . Hip pain, right 12/18/2010  . Obesity 05/09/2010  . CAD (coronary artery disease) 05/09/2010  . Hyperlipidemia 05/09/2010  . HTN (hypertension) 05/09/2010    Jerl Mina ,PT, DPT, E-RYT  07/13/2015, 10:28 PM  [place] MAIN [facility] SERVICES [address] [place], [place], [address] Phone: [phone]   Fax:  [phone]  Name: Meagan Cox MRN: [id number] Date of Birth: 10-27-1940

## 2015-07-13 NOTE — Patient Instructions (Signed)
Continue with exercises from last week   When turning, move feet first and face all headlights of feet, knees, hips, shoulders at the object   Singing at home: practice noticing pelvic floor and diaphragm movements

## 2015-07-14 ENCOUNTER — Ambulatory Visit: Payer: Medicare Other | Admitting: Physical Therapy

## 2015-07-14 DIAGNOSIS — M791 Myalgia, unspecified site: Secondary | ICD-10-CM

## 2015-07-14 DIAGNOSIS — R2689 Other abnormalities of gait and mobility: Secondary | ICD-10-CM

## 2015-07-14 DIAGNOSIS — R293 Abnormal posture: Secondary | ICD-10-CM

## 2015-07-14 NOTE — Therapy (Signed)
[place] MAIN [facility] SERVICES [address] [place], [place], [address] Phone: [phone]   Fax:  [phone]  Physical Therapy Treatment  Patient Details  Name: Meagan Cox MRN: [id number] Date of Birth: [birth year] Referring Provider: Derrel Nip MD  Encounter Date: 07/14/2015      PT End of Session - 07/14/15 0945    Visit Number 9   Number of Visits 12   Date for PT Re-Evaluation 09/08/15   Authorization Type 9   PT Start Time 0850   PT Stop Time 0945   PT Time Calculation (min) 55 min   Activity Tolerance Patient tolerated treatment well;No increased pain   Behavior During Therapy [facility] for tasks assessed/performed      Past Medical History  Diagnosis Date  . Hypertension   . Hyperlipidemia   . Hypothyroidism     Past Surgical History  Procedure Laterality Date  . Cardiac catheterization      30 years ago, [place]  . Tonsillectomy    . Partial hysterectomy    . Abdominal hysterectomy      There were no vitals filed for this visit.      Subjective Assessment - 07/13/15 0909    Subjective Pt reported she had only 2 instances where her R hip "gave out" on her across the past week. Pt feels she is getting used to the hip not hurting to much. But when the catching of the hip occurs, pt feels it takes her breath away.                   Adult Aquatic Therapy - 07/14/15 0942    Aquatic Therapy Subjective   Subjective Pt had medial thigh pain with sit to stand and stairclimbing, and L sidestepping (hip add).  Sit to stand exercises and L sidestepping were discontinued. Stairclimbing out of the pool was modified with L LE leading.        O: Pt entered/exited the pool via steps with single UE support on rail.   50 ft =1 lap  Exercises performed in 4'6" depth  2 laps walking backward with noodle under armpit and thighs 2 laps resisted walking backwards with cue for more weight bearing into feet and pulling  less with arms, more from COM 2 laps flutter kicks, sideways on noodle behind armpits  2 laps tricep pushs with noodle behind back with side stepping R only       Exercises performed in 3'6" depth stretches, quad and hips with noodle with UE support on rail 10' Bilateral 10 R lateral step ups x 2  10 reps of mini squats 5 reps of front lunges with cues for alignment and propioception , 5 reps of backward lunges         PT Long Term Goals - 07/13/15 1104    PT LONG TERM GOAL #1   Title Pt will decrease her ODI score from 24% to < 20% in order to travel on her trips to [place] and walk long distances on uneven surfaces.   Time 12   Period Weeks   Status Partially Met   PT LONG TERM GOAL #2   Title Pt will decrease her PSQI score from 29% to < 24% in order to improve sleep quality and demonstrate ability to lay in a comfortable position.    Time 12   Period Weeks   Status Partially Met   PT LONG TERM GOAL #3   Title Pt will  be complaint with water intake in order to promote bladder health and hydration for muscles for walking.    Time 12   Period Weeks   Status On-going   PT LONG TERM GOAL #4   Title Pt will demo no pelvic asymmetries and increased hip PROM IR bilaterally > 10 deg in order to improve gait mechanics for ambulation.    Time 12   Period Weeks   Status Achieved   PT LONG TERM GOAL #5   Title Pt will demo no pain with sidestepping L with yellow resistive band at thigh across 5 steps in order to perform exercises and to return to walking longer distances and maintain proper alignment of hips and LE.   Time 12   Period Weeks   Status On-going               Plan - 07/14/15 0946    Clinical Impression Statement Pt demo'd ability to perform mini squats today without pain which is a significant improvement. Pt tolerated session without complaints. Focused on propioception , alignment, and hip strengthening to minimize genu valgus. Pt is progressing well with PT  and advancing towards her goals.   Rehab Potential Good   PT Frequency 2x / week   PT Duration 8 weeks   PT Treatment/Interventions ADLs/Self Care Home Management;Aquatic Therapy;Gait training;Traction;Moist Heat;Stair training;Functional mobility training;Therapeutic activities;Therapeutic exercise;Balance training;Neuromuscular re-education;Electrical Stimulation;Cryotherapy;Manual techniques;Patient/family education;Passive range of motion;Dry needling;Energy conservation   Consulted and Agree with Plan of Care Patient      Patient will benefit from skilled therapeutic intervention in order to improve the following deficits and impairments:  Abnormal gait, Pain, Improper body mechanics, Postural dysfunction, Other (comment), Decreased mobility, Decreased coordination, Decreased activity tolerance, Decreased endurance, Decreased balance, Decreased safety awareness, Difficulty walking, Impaired flexibility, Decreased range of motion, Obesity, Decreased strength, Decreased scar mobility, Increased muscle spasms, Cardiopulmonary status limiting activity  Visit Diagnosis: Other abnormalities of gait and mobility  Myalgia  Poor posture     Problem List Patient Active Problem List   Diagnosis Date Noted  . Cerebrovascular small vessel disease 04/24/2015  . Benign paroxysmal positional vertigo 04/24/2015  . Pain in joint, pelvic region and thigh 04/24/2015  . Counseling for travel 01/11/2015  . Shoulder pain, right 11/22/2013  . Statin intolerance 10/08/2012  . Edema 05/14/2012  . Generalized anxiety disorder 01/06/2012  . 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 Hypothyroidism 12/18/2010  . Low back pain radiating to both legs 12/18/2010  . Hip pain, right 12/18/2010  . Obesity 05/09/2010  . CAD (coronary artery disease) 05/09/2010  . Hyperlipidemia 05/09/2010  . HTN (hypertension) 05/09/2010    Jerl Mina ,PT, DPT, E-RYT  07/14/2015, 9:58 AM  [place] MAIN [facility] SERVICES [address] [place], [place], [address] Phone: [phone]   Fax:  [phone]  Name: Damian Hofstra MRN: [id number] Date of Birth: 08/06/1940

## 2015-07-18 ENCOUNTER — Other Ambulatory Visit (INDEPENDENT_AMBULATORY_CARE_PROVIDER_SITE_OTHER): Payer: PPO

## 2015-07-18 DIAGNOSIS — E559 Vitamin D deficiency, unspecified: Secondary | ICD-10-CM | POA: Diagnosis not present

## 2015-07-18 DIAGNOSIS — E038 Other specified hypothyroidism: Secondary | ICD-10-CM | POA: Diagnosis not present

## 2015-07-18 DIAGNOSIS — E034 Atrophy of thyroid (acquired): Secondary | ICD-10-CM | POA: Diagnosis not present

## 2015-07-18 DIAGNOSIS — I10 Essential (primary) hypertension: Secondary | ICD-10-CM | POA: Diagnosis not present

## 2015-07-18 DIAGNOSIS — I679 Cerebrovascular disease, unspecified: Secondary | ICD-10-CM | POA: Diagnosis not present

## 2015-07-18 LAB — COMPREHENSIVE METABOLIC PANEL
ALBUMIN: 3.8 g/dL (ref 3.5–5.2)
ALT: 12 U/L (ref 0–35)
AST: 13 U/L (ref 0–37)
Alkaline Phosphatase: 55 U/L (ref 39–117)
BUN: 12 mg/dL (ref 6–23)
CHLORIDE: 106 meq/L (ref 96–112)
CO2: 26 meq/L (ref 19–32)
CREATININE: 0.64 mg/dL (ref 0.40–1.20)
Calcium: 9 mg/dL (ref 8.4–10.5)
GFR: 96.56 mL/min (ref >60.00)
Glucose, Bld: 94 mg/dL (ref 70–99)
POTASSIUM: 4.3 meq/L (ref 3.5–5.1)
SODIUM: 140 meq/L (ref 135–145)
Total Bilirubin: 0.4 mg/dL (ref 0.2–1.2)
Total Protein: 6.3 g/dL (ref 6.0–8.3)

## 2015-07-18 LAB — VITAMIN D 25 HYDROXY (VIT D DEFICIENCY, FRACTURES): VITD: 37.58 ng/mL (ref 30.00–100.00)

## 2015-07-18 LAB — LIPID PANEL
CHOL/HDL RATIO: 4
Cholesterol: 242 mg/dL — ABNORMAL HIGH (ref 0–200)
HDL: 54.4 mg/dL (ref >39.00)
NONHDL: 187.77
Triglycerides: 262 mg/dL — ABNORMAL HIGH (ref 0.0–149.0)
VLDL: 52.4 mg/dL — AB (ref 0.0–40.0)

## 2015-07-18 LAB — LDL CHOLESTEROL, DIRECT: LDL DIRECT: 146 mg/dL

## 2015-07-18 LAB — TSH: TSH: 3.31 u[IU]/mL (ref 0.35–4.50)

## 2015-07-19 ENCOUNTER — Ambulatory Visit: Payer: PPO | Admitting: Physical Therapy

## 2015-07-19 DIAGNOSIS — R2689 Other abnormalities of gait and mobility: Secondary | ICD-10-CM | POA: Diagnosis not present

## 2015-07-19 DIAGNOSIS — M791 Myalgia, unspecified site: Secondary | ICD-10-CM

## 2015-07-19 DIAGNOSIS — R293 Abnormal posture: Secondary | ICD-10-CM

## 2015-07-19 NOTE — Therapy (Signed)
[place] MAIN [facility] SERVICES [address] [place], [place], [address] Phone: [phone]   Fax:  [phone]  Physical Therapy Treatment  Patient Details  Name: Meagan Cox MRN: [id number] Date of Birth: 1940/02/28 Referring Provider: Derrel Nip MD  Encounter Date: 07/19/2015      PT End of Session - 07/19/15 1100    Visit Number 10   Number of Visits 12   Date for PT Re-Evaluation 09/08/15   Authorization Type 10   PT Start Time 0935   PT Stop Time 1025   PT Time Calculation (min) 50 min      Past Medical History  Diagnosis Date  . Hypertension   . Hyperlipidemia   . Hypothyroidism     Past Surgical History  Procedure Laterality Date  . Cardiac catheterization      30 years ago, [place]  . Tonsillectomy    . Partial hysterectomy    . Abdominal hysterectomy      There were no vitals filed for this visit.      Subjective Assessment - 07/19/15 1051    Subjective Pt has been practicing her HEP.  Pt was accompanied by her dtr today.                      Adult Aquatic Therapy - 07/19/15 1053    Aquatic Therapy Subjective   Subjective Pt had no complaints except for a slight catching sensation with frog stretch in LE movements      O: Pt entered/exited the pool via steps with single UE support on rail.  50 ft =1 lap Exercises performed in 3'6" depth  2 laps walking forward and backward walking 10' stepclimbing with RLE + hip abd (square pattern) on step (no UE support)  4 laps pertubation training for faster stepping strategies  (excessive cues to decrease hip strategies, decrease genu valgus)   Stretches: quad, hip flexor, hip external rotators with cues for alignment     Exercises performed in 4'6" depth  4 laps hip abd/add with feet propioception (noodles under arms) cued for plantarflexion to strengthening plantar fascia 2 laps hopping on RLE, LLE , SLS on noodle under foot  8 ' chest rows  with dumbbells with noodle under thighs    Withhold LE breasttroke LE movements due to c/o of pulling in her hips   Seated relaxation 5'            PT Education - 07/19/15 1059    Education provided Yes   Education Details HEP, modifications to water exercises to optimize on stretches and thoracolumbar strengthening   Person(s) Educated Patient   Methods Explanation;Demonstration;Tactile cues;Verbal cues;Handout   Comprehension Returned demonstration;Verbalized understanding             PT Long Term Goals - 07/19/15 1101    PT LONG TERM GOAL #1   Title Pt will decrease her ODI score from 24% to < 20% in order to travel on her trips to [place] and walk long distances on uneven surfaces.   Time 12   Period Weeks   Status Partially Met   PT LONG TERM GOAL #2   Title Pt will decrease her PSQI score from 29% to < 24% in order to improve sleep quality and demonstrate ability to lay in a comfortable position.    Time 12   Period Weeks   Status Partially Met   PT LONG TERM GOAL #3   Title Pt  will be complaint with water intake in order to promote bladder health and hydration for muscles for walking.    Time 12   Status Achieved   PT LONG TERM GOAL #4   Title Pt will demo no pelvic asymmetries and increased hip PROM IR bilaterally > 10 deg in order to improve gait mechanics for ambulation.    Time 12   Period Weeks   Status Achieved   PT LONG TERM GOAL #5   Title Pt will demo no pain with sidestepping L with yellow resistive band at thigh across 5 steps in order to perform exercises and to return to walking longer distances and maintain proper alignment of hips and LE.   Time 12   Period Weeks   Status Partially Met   Additional Long Term Goals   Additional Long Term Goals Yes   PT LONG TERM GOAL #6   Title Pt will demo IND with HEP   Time 12   Period Weeks   Status Partially Met               Plan - August 06, 2015 1100    Clinical Impression Statement Pt has  achieved 2/6 goals and has partially met her remaining goals. Pt continues to make excellent progress with strengthening exercises. Pt will benefit from skilled PT.    Rehab Potential Good   PT Frequency 2x / week   PT Duration 8 weeks   PT Treatment/Interventions ADLs/Self Care Home Management;Aquatic Therapy;Gait training;Traction;Moist Heat;Stair training;Functional mobility training;Therapeutic activities;Therapeutic exercise;Balance training;Neuromuscular re-education;Electrical Stimulation;Cryotherapy;Manual techniques;Patient/family education;Passive range of motion;Dry needling;Energy conservation   Consulted and Agree with Plan of Care Patient      Patient will benefit from skilled therapeutic intervention in order to improve the following deficits and impairments:  Abnormal gait, Pain, Improper body mechanics, Postural dysfunction, Other (comment), Decreased mobility, Decreased coordination, Decreased activity tolerance, Decreased endurance, Decreased balance, Decreased safety awareness, Difficulty walking, Impaired flexibility, Decreased range of motion, Obesity, Decreased strength, Decreased scar mobility, Increased muscle spasms, Cardiopulmonary status limiting activity  Visit Diagnosis: Other abnormalities of gait and mobility  Myalgia  Poor posture       G-Codes - 08-06-15 1103    Functional Assessment Tool Used clinical judgment (no pain with mini squat, no genu valgus with sit to stand, no gait deviations)     Functional Limitation Self care;Mobility: Walking and moving around   Mobility: Walking and Moving Around Current Status [phone]) At least 1 percent but less than 20 percent impaired, limited or restricted   Mobility: Walking and Moving Around Goal Status [phone]) At least 1 percent but less than 20 percent impaired, limited or restricted   Self Care Current Status ([id number]) At least 20 percent but less than 40 percent impaired, limited or restricted   Self Care Goal  Status ([id number]) At least 1 percent but less than 20 percent impaired, limited or restricted      Problem List Patient Active Problem List   Diagnosis Date Noted  . Cerebrovascular small vessel disease 04/24/2015  . Benign paroxysmal positional vertigo 04/24/2015  . Pain in joint, pelvic region and thigh 04/24/2015  . Counseling for travel 01/11/2015  . Shoulder pain, right 11/22/2013  . Statin intolerance 10/08/2012  . Edema 05/14/2012  . Generalized anxiety disorder 01/06/2012  . 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 Hypothyroidism 12/18/2010  . Low back pain radiating to both legs 12/18/2010  . Hip pain, right 12/18/2010  . Obesity 05/09/2010  . CAD (coronary artery disease) 05/09/2010  .  Hyperlipidemia 05/09/2010  . HTN (hypertension) 05/09/2010    Jerl Mina ,PT, DPT, E-RYT  07/19/2015, 11:04 AM  [place] MAIN [facility] SERVICES [address] [place], [place], [address] Phone: [phone]   Fax:  [phone]  Name: Meagan Cox MRN: [id number] Date of Birth: 02-20-1940

## 2015-07-21 ENCOUNTER — Ambulatory Visit: Payer: PPO | Admitting: Physical Therapy

## 2015-07-21 DIAGNOSIS — R2689 Other abnormalities of gait and mobility: Secondary | ICD-10-CM

## 2015-07-21 DIAGNOSIS — R293 Abnormal posture: Secondary | ICD-10-CM

## 2015-07-21 DIAGNOSIS — M791 Myalgia, unspecified site: Secondary | ICD-10-CM

## 2015-07-21 NOTE — Therapy (Signed)
[place] MAIN [facility] SERVICES [address] [place], [place], [address] Phone: [phone]   Fax:  [phone]  Physical Therapy Treatment  Patient Details  Name: Meagan Cox MRN: [id number] Date of Birth: 07/29/1940 Referring Provider: Derrel Nip MD  Encounter Date: 07/21/2015      PT End of Session - 07/21/15 1134    Visit Number 11   Date for PT Re-Evaluation 09/08/15   Authorization Type 1 of 10 g code   PT Start Time 0915   PT Stop Time 1008   PT Time Calculation (min) 53 min   Activity Tolerance Patient tolerated treatment well   Behavior During Therapy [facility] for tasks assessed/performed      Past Medical History  Diagnosis Date  . Hypertension   . Hyperlipidemia   . Hypothyroidism     Past Surgical History  Procedure Laterality Date  . Cardiac catheterization      30 years ago, [place]  . Tonsillectomy    . Partial hysterectomy    . Abdominal hysterectomy      There were no vitals filed for this visit.      Subjective Assessment - 07/21/15 0913    Subjective Pt reported she has had some catching episodes in her RLE but intensity of these epsiodes are 25% less than it had been. When these catching episodes occur, pt says it does not feel like she will fall like it just to do.                     Adult Aquatic Therapy - 07/21/15 1125    Aquatic Therapy Subjective   Subjective Pt had complaint of catching pain in RLE when she stepped laterally with LLE too wide and with mini squat ad anterior pelvic tilt. Pt reported no pain with smaller lateral steps and mini squats with posterior pelvic tilt        O: Pt entered/exited the pool via steps with single UE support on rail.  50 ft =1 lap  Exercises performed in 3'6" depth   2 laps walking with feet along pool floor line 2 laps with perturbation training but pt showed difficulty with coordination and poor ability to bear equal weight on supporting  LE, which caused genus valgus and the "catching" sensation she c/o.   10' of neuro re-edu on smaller lateral steps before the point of pain, cued for TrA activation, posterior pelvic tilt, demonstrated and applied tactile cues for equal weight bearing on RLE and awareness of COM before L lateral step.   2 laps lateral stepping L only to learn equal weight bearing on RLE and awareness of COM before L lateral step. (no pain reported)  No UE support used.  8 lateral step ups on step R and L (cued for propioception to decrease LOB)   Hip circles (small), heel raises with slow eccentric control and cues for awareness of ant/posterior COM  Stretches       5' rest                   PT Long Term Goals - 07/19/15 1101    PT LONG TERM GOAL #1   Title Pt will decrease her ODI score from 24% to < 20% in order to travel on her trips to [place] and walk long distances on uneven surfaces.   Time 12   Period Weeks   Status Partially Met   PT LONG TERM GOAL #2  Title Pt will decrease her PSQI score from 29% to < 24% in order to improve sleep quality and demonstrate ability to lay in a comfortable position.    Time 12   Period Weeks   Status Partially Met   PT LONG TERM GOAL #3   Title Pt will be complaint with water intake in order to promote bladder health and hydration for muscles for walking.    Time 12   Status Achieved   PT LONG TERM GOAL #4   Title Pt will demo no pelvic asymmetries and increased hip PROM IR bilaterally > 10 deg in order to improve gait mechanics for ambulation.    Time 12   Period Weeks   Status Achieved   PT LONG TERM GOAL #5   Title Pt will demo no pain with sidestepping L with yellow resistive band at thigh across 5 steps in order to perform exercises and to return to walking longer distances and maintain proper alignment of hips and LE.   Time 12   Period Weeks   Status Partially Met   Additional Long Term Goals   Additional Long Term Goals Yes   PT LONG  TERM GOAL #6   Title Pt will demo IND with HEP   Time 12   Period Weeks   Status Partially Met               Plan - 07/21/15 1108    Clinical Impression Statement Pt required excessive cuing for equal bearing in RLE with L lateral stepping within small range as well as anterior / posterior COM balance with heel raises. Pt reported no pain with proper cues. Pt continues to benefit from skilled PT>    Rehab Potential Good   PT Frequency 2x / week   PT Duration 8 weeks   PT Treatment/Interventions ADLs/Self Care Home Management;Aquatic Therapy;Gait training;Traction;Moist Heat;Stair training;Functional mobility training;Therapeutic activities;Therapeutic exercise;Balance training;Neuromuscular re-education;Electrical Stimulation;Cryotherapy;Manual techniques;Patient/family education;Passive range of motion;Dry needling;Energy conservation   Consulted and Agree with Plan of Care Patient      Patient will benefit from skilled therapeutic intervention in order to improve the following deficits and impairments:  Abnormal gait, Pain, Improper body mechanics, Postural dysfunction, Other (comment), Decreased mobility, Decreased coordination, Decreased activity tolerance, Decreased endurance, Decreased balance, Decreased safety awareness, Difficulty walking, Impaired flexibility, Decreased range of motion, Obesity, Decreased strength, Decreased scar mobility, Increased muscle spasms, Cardiopulmonary status limiting activity  Visit Diagnosis: Other abnormalities of gait and mobility  Myalgia  Poor posture     Problem List Patient Active Problem List   Diagnosis Date Noted  . Cerebrovascular small vessel disease 04/24/2015  . Benign paroxysmal positional vertigo 04/24/2015  . Pain in joint, pelvic region and thigh 04/24/2015  . Counseling for travel 01/11/2015  . Shoulder pain, right 11/22/2013  . Statin intolerance 10/08/2012  . Edema 05/14/2012  . Generalized anxiety disorder  01/06/2012  . 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 Hypothyroidism 12/18/2010  . Low back pain radiating to both legs 12/18/2010  . Hip pain, right 12/18/2010  . Obesity 05/09/2010  . CAD (coronary artery disease) 05/09/2010  . Hyperlipidemia 05/09/2010  . HTN (hypertension) 05/09/2010    Jerl Mina ,PT, DPT, E-RYT  07/21/2015, 11:35 AM  [place] MAIN [facility] SERVICES [address] [place], [place], [address] Phone: [phone]   Fax:  [phone]  Name: Meagan Cox MRN: [id number] Date of Birth: Apr 28, 1940

## 2015-07-22 ENCOUNTER — Encounter: Payer: Self-pay | Admitting: Internal Medicine

## 2015-07-22 ENCOUNTER — Ambulatory Visit (INDEPENDENT_AMBULATORY_CARE_PROVIDER_SITE_OTHER): Payer: PPO | Admitting: Internal Medicine

## 2015-07-22 VITALS — BP 128/80 | HR 62 | Temp 97.6°F | Resp 12 | Ht 64.25 in | Wt 209.2 lb

## 2015-07-22 DIAGNOSIS — E669 Obesity, unspecified: Secondary | ICD-10-CM

## 2015-07-22 DIAGNOSIS — M25559 Pain in unspecified hip: Secondary | ICD-10-CM

## 2015-07-22 DIAGNOSIS — Z1239 Encounter for other screening for malignant neoplasm of breast: Secondary | ICD-10-CM | POA: Diagnosis not present

## 2015-07-22 DIAGNOSIS — Z780 Asymptomatic menopausal state: Secondary | ICD-10-CM

## 2015-07-22 DIAGNOSIS — I10 Essential (primary) hypertension: Secondary | ICD-10-CM

## 2015-07-22 DIAGNOSIS — Z0000 Encounter for general adult medical examination without abnormal findings: Secondary | ICD-10-CM | POA: Diagnosis not present

## 2015-07-22 DIAGNOSIS — E038 Other specified hypothyroidism: Secondary | ICD-10-CM

## 2015-07-22 DIAGNOSIS — E034 Atrophy of thyroid (acquired): Secondary | ICD-10-CM

## 2015-07-22 MED ORDER — TETANUS-DIPHTH-ACELL PERTUSSIS 5-2.5-18.5 LF-MCG/0.5 IM SUSP: 0.5000 mL | Freq: Once | INTRAMUSCULAR | Status: DC

## 2015-07-22 MED ORDER — RED YEAST RICE 600 MG PO CAPS: 1.0000 | ORAL_CAPSULE | Freq: Two times a day (BID) | ORAL | Status: DC

## 2015-07-22 NOTE — Progress Notes (Signed)
Pre-visit discussion using our clinic review tool. No additional management support is needed unless otherwise documented below in the visit note.  

## 2015-07-22 NOTE — Patient Instructions (Signed)
Based on your fasting cholesterol a ,  your 10 year risk of having some type of coronary event (including heart attack) is 13% , meaning that one of  every 7 WOMEN with the same medical statistics will have a heart attack in the next 10 years.     The [employer] recommends starting patients on moderate intensity statin therapy for people with this risk  to lower your risks of these events.    The natural remedies for cholesterol have not been proven to reduce your risk for a heart attack.  Red Yeast Rice has not been proven either,  But it does lower cholesterol, so if you want to try it , the dose is 600 mg twice daily in capsule form, available OTC. I sent an rx to your  pharmacy  There are many things that you can do to help prevent heart disease and stroke:  Have your blood pressure checked at least every 1-2 years. High blood pressure causes heart disease and increases the risk of stroke.  If you are 33-62 years old, ask your health care provider if you should take aspirin to prevent a heart attack or a stroke.  Do not use any tobacco products, including cigarettes, chewing tobacco, or electronic cigarettes. If you need help quitting, ask your health care provider.  It is important to eat a healthy diet and maintain a healthy weight.  Be sure to include plenty of vegetables, fruits, low-fat dairy products, and lean protein.  Avoid eating foods that are high in solid fats, added sugars, or salt (sodium).  Get regular exercise. This is one of the most important things that you can do for your health.  Try to exercise for at least 150 minutes each week. The type of exercise that you do should increase your heart rate and make you sweat. This is known as moderate-intensity exercise.  Try to do strengthening exercises at least twice each week. Do these in addition to the moderate-intensity exercise.  Know your numbers.Ask your health care provider to check your  cholesterol and your blood glucose. Continue to have your blood tested as directed by your health care provider. WHAT SHOULD I KNOW ABOUT CANCER SCREENING? There are several types of cancer. Take the following steps to reduce your risk and to catch any cancer development as early as possible. Breast Cancer  Practice breast self-awareness.  This means understanding how your breasts normally appear and feel.  It also means doing regular breast self-exams. Let your health care provider know about any changes, no matter how small.  If you are 13 or older, have a clinician do a breast exam (clinical breast exam or CBE) every year. Depending on your age, family history, and medical history, it may be recommended that you also have a yearly breast X-ray (mammogram).  If you have a family history of breast cancer, talk with your health care provider about genetic screening.  If you are at high risk for breast cancer, talk with your health care provider about having an MRI and a mammogram every year.  Breast cancer (BRCA) gene test is recommended for women who have family members with BRCA-related cancers. Results of the assessment will determine the need for genetic counseling and BRCA1 and for BRCA2 testing. BRCA-related cancers include these types:  Breast. This occurs in males or females.  Ovarian.  Tubal. This may also be called fallopian tube cancer.  Cancer of the abdominal or pelvic lining (peritoneal cancer).  Prostate.  Pancreatic. Cervical, Uterine, and Ovarian Cancer Your health care provider may recommend that you be screened regularly for cancer of the pelvic organs. These include your ovaries, uterus, and vagina. This screening involves a pelvic exam, which includes checking for microscopic changes to the surface of your cervix (Pap test).  For women ages 21-65, health care providers may recommend a pelvic exam and a Pap test every three years. For women ages 90-65, they may  recommend the Pap test and pelvic exam, combined with testing for human papilloma virus (HPV), every five years. Some types of HPV increase your risk of cervical cancer. Testing for HPV may also be done on women of any age who have unclear Pap test results.  Other health care providers may not recommend any screening for nonpregnant women who are considered low risk for pelvic cancer and have no symptoms. Ask your health care provider if a screening pelvic exam is right for you.  If you have had past treatment for cervical cancer or a condition that could lead to cancer, you need Pap tests and screening for cancer for at least 20 years after your treatment. If Pap tests have been discontinued for you, your risk factors (such as having a new sexual partner) need to be reassessed to determine if you should start having screenings again. Some women have medical problems that increase the chance of getting cervical cancer. In these cases, your health care provider may recommend that you have screening and Pap tests more often.  If you have a family history of uterine cancer or ovarian cancer, talk with your health care provider about genetic screening.  If you have vaginal bleeding after reaching menopause, tell your health care provider.  There are currently no reliable tests available to screen for ovarian cancer. Lung Cancer Lung cancer screening is recommended for adults 74-52 years old who are at high risk for lung cancer because of a history of smoking. A yearly low-dose CT scan of the lungs is recommended if you:  Currently smoke.  Have a history of at least 30 pack-years of smoking and you currently smoke or have quit within the past 15 years. A pack-year is smoking an average of one pack of cigarettes per day for one year. Yearly screening should:  Continue until it has been 15 years since you quit.  Stop if you develop a health problem that would prevent you from having lung cancer  treatment. Colorectal Cancer  This type of cancer can be detected and can often be prevented.  Routine colorectal cancer screening usually begins at age 1 and continues through age 73.  If you have risk factors for colon cancer, your health care provider may recommend that you be screened at an earlier age.  If you have a family history of colorectal cancer, talk with your health care provider about genetic screening.  Your health care provider may also recommend using home test kits to check for hidden blood in your stool.  A small camera at the end of a tube can be used to examine your colon directly (sigmoidoscopy or colonoscopy). This is done to check for the earliest forms of colorectal cancer.  Direct examination of the colon should be repeated every 5-10 years until age 30. However, if early forms of precancerous polyps or small growths are found or if you have a family history or genetic risk for colorectal cancer, you may need to be screened more often. Skin Cancer  Check your skin  from head to toe regularly.  Monitor any moles. Be sure to tell your health care provider:  About any new moles or changes in moles, especially if there is a change in a mole's shape or color.  If you have a mole that is larger than the size of a pencil eraser.  If any of your family members has a history of skin cancer, especially at a young age, talk with your health care provider about genetic screening.  Always use sunscreen. Apply sunscreen liberally and repeatedly throughout the day.  Whenever you are outside, protect yourself by wearing long sleeves, pants, a wide-brimmed hat, and sunglasses. WHAT SHOULD I KNOW ABOUT OSTEOPOROSIS? Osteoporosis is a condition in which bone destruction happens more quickly than new bone creation. After menopause, you may be at an increased risk for osteoporosis. To help prevent osteoporosis or the bone fractures that can happen because of osteoporosis, the  following is recommended:  If you are 45-41 years old, get at least 1,000 mg of calcium and at least 600 mg of vitamin D per day.  If you are older than age 101 but younger than age 95, get at least 1,200 mg of calcium and at least 600 mg of vitamin D per day.  If you are older than age 66, get at least 1,200 mg of calcium and at least 800 mg of vitamin D per day. Smoking and excessive alcohol intake increase the risk of osteoporosis. Eat foods that are rich in calcium and vitamin D, and do weight-bearing exercises several times each week as directed by your health care provider. WHAT SHOULD I KNOW ABOUT HOW MENOPAUSE AFFECTS [place]? Depression may occur at any age, but it is more common as you become older. Common symptoms of depression include:  Low or sad mood.  Changes in sleep patterns.  Changes in appetite or eating patterns.  Feeling an overall lack of motivation or enjoyment of activities that you previously enjoyed.  Frequent crying spells. Talk with your health care provider if you think that you are experiencing depression. WHAT SHOULD I KNOW ABOUT IMMUNIZATIONS? It is important that you get and maintain your immunizations. These include:  Tetanus, diphtheria, and pertussis (Tdap) booster vaccine.  Influenza every year before the flu season begins.  Pneumonia vaccine.  Shingles vaccine. Your health care provider may also recommend other immunizations.   This information is not intended to replace advice given to you by your health care provider. Make sure you discuss any questions you have with your health care provider.   Document Released: 03/09/2005 Document Revised: 02/05/2014 Document Reviewed: 09/17/2013 Elsevier Interactive Patient Education [employer].

## 2015-07-22 NOTE — Progress Notes (Signed)
Patient ID: Meagan Cox, female    DOB: 05-12-1940  Age: 75 y.o. MRN: [id number]  The patient is here for her annual Medicare wellness examination and management of other chronic and acute problems.   Overdue for mammogram,  Colon ca screening  DEXA scan and Tdap   The risk factors are reflected in the social history.  The roster of all physicians providing medical care to patient - is listed in the Snapshot section of the chart.  Activities of daily living:  The patient is 100% independent in all ADLs: dressing, toileting, feeding as well as independent mobility  Home safety : The patient has smoke detectors in the home. They wear seatbelts.  There are no firearms at home. There is no violence in the home.   There is no risks for hepatitis, STDs or HIV. There is no   history of blood transfusion. They have no travel history to infectious disease endemic areas of the world.  The patient has seen their dentist in the last six month. They have seen their eye doctor in the last year. They admit to slight hearing difficulty with regard to whispered voices and some television programs.  They have deferred audiologic testing in the last year.  They do not  have excessive sun exposure. Discussed the need for sun protection: hats, long sleeves and use of sunscreen if there is significant sun exposure.   Diet: the importance of a healthy diet is discussed. They do have a healthy diet.  The benefits of regular aerobic exercise were discussed. She is swimming and walking 4 times per week ,  20 minutes. nd is attending PT in the pool twice weekly at the [facility] at the [place].    Depression screen: there are no signs or vegative symptoms of depression- irritability, change in appetite, anhedonia, sadness/tearfullness.  Cognitive assessment: the patient manages all their financial and personal affairs and is actively engaged. They could relate day,date,year and events; recalled  2/3 objects at 3 minutes; performed clock-face test normally.  The following portions of the patient's history were reviewed and updated as appropriate: allergies, current medications, past family history, past medical history,  past surgical history, past social history  and problem list.  Visual acuity was not assessed per patient preference since she has regular follow up with her ophthalmologist. Hearing and body mass index were assessed and reviewed.   During the course of the visit the patient was educated and counseled about appropriate screening and preventive services including : fall prevention , diabetes screening, nutrition counseling, colorectal cancer screening, and recommended immunizations.    CC: The primary encounter diagnosis was Breast cancer screening. Diagnoses of Postmenopausal estrogen deficiency, Medicare annual wellness visit, subsequent, Pain in joint, pelvic region and thigh, unspecified laterality, Hypothyroidism due to acquired atrophy of thyroid, Essential hypertension, and Obesity were also pertinent to this visit.  She is Going on a cruise in August to [place]   Continues to lose weight gradually but steadily from 247 lb in Sept 2014;  38 lbs thus far or 15% of body weight  Still going to PT for right hip and pelvic pain   History Beronica has a past medical history of Hypertension; Hyperlipidemia; and Hypothyroidism.   She has past surgical history that includes Cardiac catheterization; Tonsillectomy; Partial hysterectomy; and Abdominal hysterectomy.   Her family history includes Cancer in her father; Heart disease in her mother; Hypothyroidism in her mother.She reports that she quit smoking about 27 years  ago. Her smoking use included Cigarettes. She quit after 20 years of use. She has never used smokeless tobacco. She reports that she drinks about 2.4 oz of alcohol per week. She reports that she does not use illicit drugs.  Outpatient Prescriptions Prior to  Visit  Medication Sig Dispense Refill  . diclofenac (CATAFLAM) 50 MG tablet TAKE ONE TABLET BY MOUTH TWICE DAILY 180 tablet 1  . estradiol (ESTRACE) 2 MG tablet TAKE ONE TABLET BY MOUTH ONCE DAILY 90 tablet 3  . Ivermectin 1 % CREA Apply 1 application topically daily. 30 g 0  . levothyroxine (SYNTHROID, LEVOTHROID) 125 MCG tablet TAKE ONE TABLET BY MOUTH ONCE DAILY 90 tablet 3  . Multiple Vitamin (MULTIVITAMIN) tablet Take 1 tablet by mouth daily.      Marland Kitchen ALPRAZolam (XANAX) 0.5 MG tablet Take 1 tablet (0.5 mg total) by mouth at bedtime as needed. (Patient not taking: Reported on 07/22/2015) 30 tablet 3  . benzonatate (TESSALON) 100 MG capsule Take 2 capsules (200 mg total) by mouth 2 (two) times daily as needed for cough. (Patient not taking: Reported on 07/22/2015) 60 capsule 0  . Cyanocobalamin (B-12) 2500 MCG TABS Take by mouth. Reported on 07/22/2015    . diazepam (VALIUM) 5 MG tablet Take 1 tablet (5 mg total) by mouth every 12 (twelve) hours as needed for anxiety. (Patient not taking: Reported on 07/22/2015) 30 tablet 1  . predniSONE (DELTASONE) 10 MG tablet 6 tablets on Day 1 , then reduce by 1 tablet daily until gone (Patient not taking: Reported on 07/22/2015) 21 tablet 0   No facility-administered medications prior to visit.    Review of Systems   Patient denies headache, fevers, malaise, unintentional weight loss, skin rash, eye pain, sinus congestion and sinus pain, sore throat, dysphagia,  hemoptysis , cough, dyspnea, wheezing, chest pain, palpitations, orthopnea, edema, abdominal pain, nausea, melena, diarrhea, constipation, flank pain, dysuria, hematuria, urinary  Frequency, nocturia, numbness, tingling, seizures,  Focal weakness, Loss of consciousness,  Tremor, insomnia, depression, anxiety, and suicidal ideation.      Objective:  BP 128/80 mmHg  Pulse 62  Temp(Src) 97.6 F (36.4 C) (Oral)  Resp 12  Ht 5' 4.25" (1.632 m)  Wt 209 lb 4 oz (94.915 kg)  BMI 35.64 kg/m2  SpO2  98%  Physical Exam   General appearance: alert, cooperative and appears stated age Head: Normocephalic, without obvious abnormality, atraumatic Eyes: conjunctivae/corneas clear. PERRL, EOM's intact. Fundi benign. Ears: normal TM's and external ear canals both ears Nose: Nares normal. Septum midline. Mucosa normal. No drainage or sinus tenderness. Throat: lips, mucosa, and tongue normal; teeth and gums normal Neck: no adenopathy, no carotid bruit, no JVD, supple, symmetrical, trachea midline and thyroid not enlarged, symmetric, no tenderness/mass/nodules Lungs: clear to auscultation bilaterally Breasts: normal appearance, no masses or tenderness Heart: regular rate and rhythm, S1, S2 normal, no murmur, click, rub or gallop Abdomen: soft, non-tender; bowel sounds normal; no masses,  no organomegaly Extremities: extremities normal, atraumatic, no cyanosis or edema Pulses: 2+ and symmetric Skin: Skin color, texture, turgor normal. No rashes or lesions Neurologic: Alert and oriented X 3, normal strength and tone. Normal symmetric reflexes. Normal coordination and gait.     Assessment & Plan:   Problem List Items Addressed This Visit    Obesity    I have congratulated her in reduction of   BMI and encouraged  Continued weight loss with goal of 10% of body weight over the next 6 months using a low  glycemic index diet and regular exercise a minimum of 5 days per week.          HTN (hypertension)    Well controlled on current regimen. Renal function stable, no changes today.  Lab Results  Component Value Date   CREATININE 0.64 07/18/2015   Lab Results  Component Value Date   NA 140 07/18/2015   K 4.3 07/18/2015   CL 106 07/18/2015   CO2 26 07/18/2015         Hypothyroidism    Thyroid function is WNL on current dose.  No current changes needed.   Lab Results  Component Value Date   TSH 3.31 07/18/2015           Pain in joint, pelvic region and thigh    Dramatically  improved with PT for stretching.       Medicare annual wellness visit, subsequent    Annual Medicare wellness  exam was done as well as a comprehensive physical exam and management of acute and chronic conditions .  During the course of the visit the patient was educated and counseled about appropriate screening and preventive services including : fall prevention , diabetes screening, nutrition counseling, colorectal cancer screening, and recommended immunizations.  Printed recommendations for health maintenance screenings was given.        Other Visit Diagnoses    Breast cancer screening    -  Primary    Relevant Orders    MM DIGITAL SCREENING BILATERAL    Postmenopausal estrogen deficiency        Relevant Orders    DG Bone Density       I am having Ms. Mccarroll start on Tdap and Red Yeast Rice. I am also having her maintain her multivitamin, B-12, ALPRAZolam, benzonatate, Ivermectin, estradiol, levothyroxine, diazepam, predniSONE, and diclofenac.  Meds ordered this encounter  Medications  . Tdap (BOOSTRIX) 5-2.5-18.5 LF-MCG/0.5 injection    Sig: Inject 0.5 mLs into the muscle once.    Dispense:  0.5 mL    Refill:  0  . Red Yeast Rice 600 MG CAPS    Sig: Take 1 capsule (600 mg total) by mouth 2 (two) times daily.    Dispense:  60 capsule    Refill:  11    There are no discontinued medications.  Follow-up: Return in about 6 months (around 01/21/2016).   Crecencio Mc, MD

## 2015-07-24 DIAGNOSIS — Z0000 Encounter for general adult medical examination without abnormal findings: Secondary | ICD-10-CM | POA: Insufficient documentation

## 2015-07-24 NOTE — Assessment & Plan Note (Signed)
Dramatically improved with PT for stretching.

## 2015-07-24 NOTE — Assessment & Plan Note (Signed)
I have congratulated her in reduction of   BMI and encouraged  Continued weight loss with goal of 10% of body weight over the next 6 months using a low glycemic index diet and regular exercise a minimum of 5 days per week.     

## 2015-07-24 NOTE — Assessment & Plan Note (Signed)
Thyroid function is WNL on current dose.  No current changes needed.   Lab Results  Component Value Date   TSH 3.31 07/18/2015

## 2015-07-24 NOTE — Assessment & Plan Note (Signed)

## 2015-07-24 NOTE — Assessment & Plan Note (Signed)
Well controlled on current regimen. Renal function stable, no changes today.  Lab Results  Component Value Date   CREATININE 0.64 07/18/2015   Lab Results  Component Value Date   NA 140 07/18/2015   K 4.3 07/18/2015   CL 106 07/18/2015   CO2 26 07/18/2015

## 2015-07-25 ENCOUNTER — Ambulatory Visit: Payer: PPO | Admitting: Physical Therapy

## 2015-07-25 DIAGNOSIS — R2689 Other abnormalities of gait and mobility: Secondary | ICD-10-CM | POA: Diagnosis not present

## 2015-07-25 DIAGNOSIS — R293 Abnormal posture: Secondary | ICD-10-CM

## 2015-07-25 DIAGNOSIS — M791 Myalgia, unspecified site: Secondary | ICD-10-CM

## 2015-07-25 NOTE — Therapy (Signed)
[place] MAIN [facility] : [facility] SERVICES [address] [place], [place], [address] Phone: [phone]   Fax:  [phone]  Physical Therapy Treatment  Patient Details  Name: Meagan Cox MRN: [id number] Date of Birth: May 09, 1940 Referring Provider: Derrel Nip MD  Encounter Date: 07/25/2015      PT End of Session - 07/25/15 0934    Visit Number 12   Date for PT Re-Evaluation 09/08/15   Authorization Type 2 of 10 g code   PT Start Time 0810   PT Stop Time 0903   PT Time Calculation (min) 53 min   Activity Tolerance Patient tolerated treatment well   Behavior During Therapy [facility] for tasks assessed/performed      Past Medical History  Diagnosis Date  . Hypertension   . Hyperlipidemia   . Hypothyroidism     Past Surgical History  Procedure Laterality Date  . Cardiac catheterization      30 years ago, [place]  . Tonsillectomy    . Partial hysterectomy    . Abdominal hysterectomy      There were no vitals filed for this visit.      Subjective Assessment - 07/25/15 0810    Subjective (p) Pt reported she has noticed the R leg still "catches" and the pain "hits and then it goes away" during this past weekend 3-4 x.  Pt can't remember where and what she was doing when it occurs. Pt says it does not make her feel like she will fall. She keeps "trucking" after the pain comes on because she has to keep doing what she doing.      Pertinent History R medial thigh pain: Pt has trouble sleeping  on L side due to L arm pain  and tries to sleep on her back and belly.  Pt has difficulty sleeping on R  side due to R hip bursitis. Hx of R knee issues (meniscus "slipping out out place" and chriporactor helps to realign). Resolved for the past 1-2 years.   Gynecological: 3 vaginal birth,  without perineal trauma.  SUI . Denied saddle signs.  Pt travels to [place] 2x a year. Pt will be leaving in August for her next trip.     Patient Stated Goals standing long  periods at the airport and traveling with less pain. "Quit hurting."            [facility] PT Assessment - 07/25/15 0909    Observation/Other Assessments   Observations required verbal cuing for maintaining knees in alignment with hips   Other:   Other/Comments sidestep + minim squat without p! and without cuing for knee alignment   AROM   Overall AROM Comments no p! with hip ext.Marland Kitchen abd  4+/5 on R    PROM   Overall PROM Comments no p! with hip IR/ ER on R full range    Palpation   Palpation comment slight increased mm tensions along inguinal crease on R                      [facility] Adult PT Treatment/Exercise - 07/25/15 0910    Therapeutic Activites    Therapeutic Activities --  retested positions, pt showed improvement   Neuro Re-ed    Neuro Re-ed Details  deep core level 3, 4 (combined with opp UE overhead for multifidis training) 10 x 3    Manual Therapy   Manual therapy comments manual lymph drainage over abdomen, fascial releases over R inguinal  area ,                 PT Education - 07/25/15 0933    Education provided Yes   Education Details HEP progression, reinforced body scan practice for retraining the brain on pain   Person(s) Educated Patient   Methods Explanation;Demonstration;Tactile cues;Verbal cues;Handout   Comprehension Returned demonstration;Verbalized understanding             PT Long Term Goals - 07/19/15 1101    PT LONG TERM GOAL #1   Title Pt will decrease her ODI score from 24% to < 20% in order to travel on her trips to [place] and walk long distances on uneven surfaces.   Time 12   Period Weeks   Status Partially Met   PT LONG TERM GOAL #2   Title Pt will decrease her PSQI score from 29% to < 24% in order to improve sleep quality and demonstrate ability to lay in a comfortable position.    Time 12   Period Weeks   Status Partially Met   PT LONG TERM GOAL #3   Title Pt will be complaint with water intake in order to promote  bladder health and hydration for muscles for walking.    Time 12   Status Achieved   PT LONG TERM GOAL #4   Title Pt will demo no pelvic asymmetries and increased hip PROM IR bilaterally > 10 deg in order to improve gait mechanics for ambulation.    Time 12   Period Weeks   Status Achieved   PT LONG TERM GOAL #5   Title Pt will demo no pain with sidestepping L with yellow resistive band at thigh across 5 steps in order to perform exercises and to return to walking longer distances and maintain proper alignment of hips and LE.   Time 12   Period Weeks   Status Partially Met   Additional Long Term Goals   Additional Long Term Goals Yes   PT LONG TERM GOAL #6   Title Pt will demo IND with HEP   Time 12   Period Weeks   Status Partially Met               Plan - 07/25/15 0934    Clinical Impression Statement Pt is progressing well and shows improved alignment of her knees in closed kinetic chain but required cuing with deep core progresison exercises.  Pt continued to benefit from manual Tx to release slight fascial restrictions along inguinal crease on R. Pt has regained [facility] in AROM/ PROM hip IR/ER in supine, WFL in hip ext and increased hip abd strrength , all without pain. Plan to continue working on stepping strategies in the pool at next session. Pt continues to benefit from skilled PT.     Rehab Potential Good   PT Frequency 2x / week   PT Duration 8 weeks   PT Treatment/Interventions ADLs/Self Care Home Management;Aquatic Therapy;Gait training;Traction;Moist Heat;Stair training;Functional mobility training;Therapeutic activities;Therapeutic exercise;Balance training;Neuromuscular re-education;Electrical Stimulation;Cryotherapy;Manual techniques;Patient/family education;Passive range of motion;Dry needling;Energy conservation   Consulted and Agree with Plan of Care Patient      Patient will benefit from skilled therapeutic intervention in order to improve the following  deficits and impairments:  Abnormal gait, Pain, Improper body mechanics, Postural dysfunction, Other (comment), Decreased mobility, Decreased coordination, Decreased activity tolerance, Decreased endurance, Decreased balance, Decreased safety awareness, Difficulty walking, Impaired flexibility, Decreased range of motion, Obesity, Decreased strength, Decreased scar mobility, Increased muscle spasms, Cardiopulmonary status  limiting activity  Visit Diagnosis: Other abnormalities of gait and mobility  Myalgia  Poor posture     Problem List Patient Active Problem List   Diagnosis Date Noted  . Medicare annual wellness visit, subsequent 07/24/2015  . Cerebrovascular small vessel disease 04/24/2015  . Benign paroxysmal positional vertigo 04/24/2015  . Pain in joint, pelvic region and thigh 04/24/2015  . Counseling for travel 01/11/2015  . Shoulder pain, right 11/22/2013  . Statin intolerance 10/08/2012  . Edema 05/14/2012  . Generalized anxiety disorder 01/06/2012  . 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 Hypothyroidism 12/18/2010  . Low back pain radiating to both legs 12/18/2010  . Hip pain, right 12/18/2010  . Obesity 05/09/2010  . CAD (coronary artery disease) 05/09/2010  . Hyperlipidemia 05/09/2010  . HTN (hypertension) 05/09/2010    Jerl Mina ,PT, DPT, E-RYT  07/25/2015, 10:15 AM  [place] MAIN [facility] SERVICES [address] [place], [place], [address] Phone: [phone]   Fax:  [phone]  Name: Meagan Cox MRN: [id number] Date of Birth: 1940/12/16

## 2015-07-25 NOTE — Patient Instructions (Addendum)
Discontinue Deep core level 2 (knee out) and bridges   Replace it with Level 3 (marching ) keep all four point of the back connected to bed  10 x 3 sets/ 2 x day   Add Level 4 , heel slide + opposite arm overhead, palms face body.  10 x 3 sets/ 2 x day  *keep knees tracking with hip width*   Keep doing gorilla walk  2 hallway trips   Keep doing ankle strengthening with band 10 x 3.    ______  Self massage to decrease tightness in the R groin:  Half circles (10x)  at four points of the belly and then in groin with light pressure

## 2015-07-26 NOTE — Progress Notes (Signed)
cologuard ordered.

## 2015-07-26 NOTE — Addendum Note (Signed)
Addended by: Nanci Pina on: 07/26/2015 10:54 AM   Modules accepted: Miquel Dunn

## 2015-07-28 ENCOUNTER — Ambulatory Visit: Payer: PPO | Admitting: Physical Therapy

## 2015-07-28 DIAGNOSIS — M791 Myalgia, unspecified site: Secondary | ICD-10-CM

## 2015-07-28 DIAGNOSIS — R2689 Other abnormalities of gait and mobility: Secondary | ICD-10-CM

## 2015-07-28 DIAGNOSIS — R293 Abnormal posture: Secondary | ICD-10-CM

## 2015-07-28 NOTE — Therapy (Addendum)
[place] MAIN [facility] SERVICES [address] [place], [place], [address] Phone: [phone]   Fax:  [phone]  Physical Therapy Treatment  Patient Details  Name: Meagan Cox MRN: [id number] Date of Birth: 1940/12/26 Referring Provider: Derrel Nip MD  Encounter Date: 07/28/2015      PT End of Session - 07/28/15 0942    Visit Number 13   Date for PT Re-Evaluation 09/08/15   Authorization Type 3of 10 g code   PT Start Time 0845   PT Stop Time 0930   PT Time Calculation (min) 45 min   Activity Tolerance Patient tolerated treatment well   Behavior During Therapy [facility] for tasks assessed/performed      Past Medical History  Diagnosis Date  . Hypertension   . Hyperlipidemia   . Hypothyroidism     Past Surgical History  Procedure Laterality Date  . Cardiac catheterization      30 years ago, [place]  . Tonsillectomy    . Partial hysterectomy    . Abdominal hysterectomy      There were no vitals filed for this visit.      Subjective Assessment - 07/28/15 0941    Subjective Pt reported she had 4 episodes where leg was "catching" but they did nottake her breath away. When it happened, it only felt sore .   Pertinent History R medial thigh pain: Pt has trouble sleeping  on L side due to L arm pain  and tries to sleep on her back and belly.  Pt has difficulty sleeping on R  side due to R hip bursitis. Hx of R knee issues (meniscus "slipping out out place" and chriporactor helps to realign). Resolved for the past 1-2 years.   Gynecological: 3 vaginal birth,  without perineal trauma.  SUI . Denied saddle signs.  Pt travels to [place] 2x a year. Pt will be leaving in August for her next trip.     Patient Stated Goals standing long periods at the airport and traveling with less pain. "Quit hurting."                     Adult Aquatic Therapy - 07/28/15 0942    Aquatic Therapy Subjective   Subjective Pt had complaint of  catching pain in RLE when she stepped laterally with LLE too wide and with mini squat ad anterior pelvic tilt. Pt reported no pain with smaller lateral steps and mini squats with posterior pelvic tilt       O: Pt entered/exited the pool via steps with single UE support on rail.  50 ft =1 lap  Exercises performed in 3'6" depth   2 laps walking forward  2 laps backward walking 4 laps sides stepping L/ R    Stretches: quad, hip flexor, gastroc on step.  added extended side angle and triangle pose to address pt's c/o  tightness in R medial thigh . Pt reported she felt stretching in the muscles around her thigh.  Exercises performed in 4'6" depth    4 laps plantarflexion sidestep with slow eccentric lowering  4 laps grapevine  1' x 2  step -up forward, step dpwn backward on single step 10 hip abd/ add jumps on single step   rest 5'                PT Long Term Goals - 07/19/15 1101    PT LONG TERM GOAL #1   Title Pt will decrease her ODI  score from 24% to < 20% in order to travel on her trips to [place] and walk long distances on uneven surfaces.   Time 12   Period Weeks   Status Partially Met   PT LONG TERM GOAL #2   Title Pt will decrease her PSQI score from 29% to < 24% in order to improve sleep quality and demonstrate ability to lay in a comfortable position.    Time 12   Period Weeks   Status Partially Met   PT LONG TERM GOAL #3   Title Pt will be complaint with water intake in order to promote bladder health and hydration for muscles for walking.    Time 12   Status Achieved   PT LONG TERM GOAL #4   Title Pt will demo no pelvic asymmetries and increased hip PROM IR bilaterally > 10 deg in order to improve gait mechanics for ambulation.    Time 12   Period Weeks   Status Achieved   PT LONG TERM GOAL #5   Title Pt will demo no pain with sidestepping L with yellow resistive band at thigh across 5 steps in order to perform exercises and to return to walking longer  distances and maintain proper alignment of hips and LE.   Time 12   Period Weeks   Status Partially Met   Additional Long Term Goals   Additional Long Term Goals Yes   PT LONG TERM GOAL #6   Title Pt will demo IND with HEP   Time 12   Period Weeks   Status Partially Met               Plan - 07/28/15 0942    Clinical Impression Statement Pt reports less intense "catching" epsiodes at her R hip. Pt showed increased motor control with hip and knee alignment in side step exercises compared to last session. Pt has shown improvement with proper knee alignment in step -ups and grapevine and had no pain nor LOB. Focused on building instrinsic feet muscle / calf strength in exercises today and progressed to loaded activities such as various step up exercises (one with increased speed) as pt will need to build endurance and strength for stairclimbing.  Plan to address pt's poor ability to disassociate between trunk and pelvis.  Pt will continue benefit from skilled PT.     Rehab Potential Good   PT Frequency 2x / week   PT Duration 8 weeks   PT Treatment/Interventions ADLs/Self Care Home Management;Aquatic Therapy;Gait training;Traction;Moist Heat;Stair training;Functional mobility training;Therapeutic activities;Therapeutic exercise;Balance training;Neuromuscular re-education;Electrical Stimulation;Cryotherapy;Manual techniques;Patient/family education;Passive range of motion;Dry needling;Energy conservation   Consulted and Agree with Plan of Care Patient      Patient will benefit from skilled therapeutic intervention in order to improve the following deficits and impairments:  Abnormal gait, Pain, Improper body mechanics, Postural dysfunction, Other (comment), Decreased mobility, Decreased coordination, Decreased activity tolerance, Decreased endurance, Decreased balance, Decreased safety awareness, Difficulty walking, Impaired flexibility, Decreased range of motion, Obesity, Decreased  strength, Decreased scar mobility, Increased muscle spasms, Cardiopulmonary status limiting activity  Visit Diagnosis: Other abnormalities of gait and mobility  Myalgia  Poor posture     Problem List Patient Active Problem List   Diagnosis Date Noted  . Medicare annual wellness visit, subsequent 07/24/2015  . Cerebrovascular small vessel disease 04/24/2015  . Benign paroxysmal positional vertigo 04/24/2015  . Pain in joint, pelvic region and thigh 04/24/2015  . Counseling for travel 01/11/2015  . Shoulder pain, right 11/22/2013  .  Statin intolerance 10/08/2012  . Edema 05/14/2012  . Generalized anxiety disorder 01/06/2012  . 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 Hypothyroidism 12/18/2010  . Low back pain radiating to both legs 12/18/2010  . Hip pain, right 12/18/2010  . Obesity 05/09/2010  . CAD (coronary artery disease) 05/09/2010  . Hyperlipidemia 05/09/2010  . HTN (hypertension) 05/09/2010    Jerl Mina ,PT, DPT, E-RYT  07/28/2015, 10:08 AM  [place] MAIN [facility] SERVICES [address] [place], [place], [address] Phone: [phone]   Fax:  [phone]  Name: Meagan Cox MRN: [id number] Date of Birth: 1940-09-09

## 2015-08-05 ENCOUNTER — Ambulatory Visit: Payer: PPO | Attending: Internal Medicine | Admitting: Physical Therapy

## 2015-08-05 DIAGNOSIS — M791 Myalgia, unspecified site: Secondary | ICD-10-CM

## 2015-08-05 DIAGNOSIS — R2689 Other abnormalities of gait and mobility: Secondary | ICD-10-CM | POA: Diagnosis not present

## 2015-08-05 DIAGNOSIS — R293 Abnormal posture: Secondary | ICD-10-CM | POA: Diagnosis not present

## 2015-08-05 NOTE — Therapy (Signed)
[place] MAIN [facility] SERVICES [address] [place], [place], [address] Phone: [phone]   Fax:  [phone]  Physical Therapy Treatment  Patient Details  Name: Meagan Cox MRN: [id number] Date of Birth: 10/17/1940 Referring Provider: Derrel Nip MD  Encounter Date: 08/05/2015      PT End of Session - 08/05/15 1818    Visit Number 14   Date for PT Re-Evaluation 09/08/15   Authorization Type 4 of 10 g code   PT Start Time 0800   PT Stop Time 0900   PT Time Calculation (min) 60 min   Activity Tolerance Patient tolerated treatment well;No increased pain   Behavior During Therapy [facility] for tasks assessed/performed      Past Medical History  Diagnosis Date  . Hypertension   . Hyperlipidemia   . Hypothyroidism     Past Surgical History  Procedure Laterality Date  . Cardiac catheterization      30 years ago, [place]  . Tonsillectomy    . Partial hysterectomy    . Abdominal hysterectomy      There were no vitals filed for this visit.      Subjective Assessment - 08/05/15 0857    Subjective Pt reported she continues to not have the constant pain at the anterior / medial thigh for the past 2-3 weeks.  Since last session, pt experienced catching of her R leg about 15 x.  The intensity is not taking her breath away like it used but it is "just hurt and like my leg is going to fall off."  Pt can not recall any specific positions when the catching occurs. With one instnace, pt noticed she had been taking too long of a step lengthwise with the L leg leading and her R leg feels like the R thigh muscles is "pulling real hard."   Pt's LLE has had radiating pain to the mid  posterior thigh that caused her to not be able to sit (10/10) . This radiating pain lasted 1.5 days.  Currently, pt is able to sit again with 4-5/ 10 pain level without radiating pain.       Pertinent History R medial thigh pain: Pt has trouble sleeping  on L side due to L  arm pain  and tries to sleep on her back and belly.  Pt has difficulty sleeping on R  side due to R hip bursitis. Hx of R knee issues (meniscus "slipping out out place" and chriporactor helps to realign). Resolved for the past 1-2 years.   Gynecological: 3 vaginal birth,  without perineal trauma.  SUI . Denied saddle signs.  Pt travels to [place] 2x a year. Pt will be leaving in August for her next trip.     Patient Stated Goals standing long periods at the airport and traveling with less pain. "Quit hurting."            [facility] PT Assessment - 08/05/15 1811    AROM   Overall AROM Comments sidelying: p! with hip flexion, no p! with cue to squeeze glut with hip flexion   Ambulation/Gait   Gait Comments shuffle feet, decreased hip flexion for max swing                     [facility] Adult PT Treatment/Exercise - 08/05/15 1811    Therapeutic Activites    Therapeutic Activities --  gait training, progressed HEP to simulate walking   Neuro Re-ed    Neuro  Re-ed Details  front lunges with cue for CKC activation and alignment of lower kinetic chain 10 x each side), cue for gait for increased hip flexion and feet clearance, PNF in supine and sidelying                PT Education - 08/05/15 1817    Education provided Yes   Education Details HEP   Person(s) Educated Patient   Methods Explanation;Demonstration;Tactile cues;Verbal cues;Handout   Comprehension Returned demonstration;Verbalized understanding             PT Long Term Goals - 07/19/15 1101    PT LONG TERM GOAL #1   Title Pt will decrease her ODI score from 24% to < 20% in order to travel on her trips to [place] and walk long distances on uneven surfaces.   Time 12   Period Weeks   Status Partially Met   PT LONG TERM GOAL #2   Title Pt will decrease her PSQI score from 29% to < 24% in order to improve sleep quality and demonstrate ability to lay in a comfortable position.    Time 12   Period Weeks   Status  Partially Met   PT LONG TERM GOAL #3   Title Pt will be complaint with water intake in order to promote bladder health and hydration for muscles for walking.    Time 12   Status Achieved   PT LONG TERM GOAL #4   Title Pt will demo no pelvic asymmetries and increased hip PROM IR bilaterally > 10 deg in order to improve gait mechanics for ambulation.    Time 12   Period Weeks   Status Achieved   PT LONG TERM GOAL #5   Title Pt will demo no pain with sidestepping L with yellow resistive band at thigh across 5 steps in order to perform exercises and to return to walking longer distances and maintain proper alignment of hips and LE.   Time 12   Period Weeks   Status Partially Met   Additional Long Term Goals   Additional Long Term Goals Yes   PT LONG TERM GOAL #6   Title Pt will demo IND with HEP   Time 12   Period Weeks   Status Partially Met               Plan - 08/05/15 1818    Clinical Impression Statement Pt is progressing well as she no longer has the  medial/ anterior thigh pain that she constantly experienced. Pt's remaining complaint is the frequent "catching" of her RLE.   Pt continues to require neuro-muscular re-edu and strengthening progressions to minimize "catching" of her RLE which appears to occur with increased step length with LLE leading or when she is not activating her gluts with R hip flexion. Suspect pt's poor activation of LE in CKC during swing and stance phase is contributing to her catching episodes during gait. Pt also acknowledges she moves in a hurry and her catching episodes occurs when she is in the middle of doing sometime.  PT also considers the need for pt to receive imaging to r/o tears or other injuries.  Pt's L radiating pain was not able to be reproduced today during assessment. Pt continues to benefit from skilled PT.       Rehab Potential Good   PT Frequency 2x / week   PT Duration 8 weeks   PT Treatment/Interventions ADLs/Self Care Home  Management;Aquatic Therapy;Gait training;Traction;Moist Heat;Stair training;Functional mobility  training;Therapeutic activities;Therapeutic exercise;Balance training;Neuromuscular re-education;Electrical Stimulation;Cryotherapy;Manual techniques;Patient/family education;Passive range of motion;Dry needling;Energy conservation   Consulted and Agree with Plan of Care Patient      Patient will benefit from skilled therapeutic intervention in order to improve the following deficits and impairments:  Abnormal gait, Pain, Improper body mechanics, Postural dysfunction, Other (comment), Decreased mobility, Decreased coordination, Decreased activity tolerance, Decreased endurance, Decreased balance, Decreased safety awareness, Difficulty walking, Impaired flexibility, Decreased range of motion, Obesity, Decreased strength, Decreased scar mobility, Increased muscle spasms, Cardiopulmonary status limiting activity  Visit Diagnosis: Other abnormalities of gait and mobility  Myalgia  Poor posture     Problem List Patient Active Problem List   Diagnosis Date Noted  . Medicare annual wellness visit, subsequent 07/24/2015  . Cerebrovascular small vessel disease 04/24/2015  . Benign paroxysmal positional vertigo 04/24/2015  . Pain in joint, pelvic region and thigh 04/24/2015  . Counseling for travel 01/11/2015  . Shoulder pain, right 11/22/2013  . Statin intolerance 10/08/2012  . Edema 05/14/2012  . Generalized anxiety disorder 01/06/2012  . 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 Hypothyroidism 12/18/2010  . Low back pain radiating to both legs 12/18/2010  . Hip pain, right 12/18/2010  . Obesity 05/09/2010  . CAD (coronary artery disease) 05/09/2010  . Hyperlipidemia 05/09/2010  . HTN (hypertension) 05/09/2010    Jerl Mina ,PT, DPT, E-RYT  08/05/2015, 6:28 PM  [place] MAIN [facility] SERVICES [address] [place], [place], [address] Phone:  [phone]   Fax:  [phone]  Name: Meagan Cox MRN: [id number] Date of Birth: 1940-06-04

## 2015-08-05 NOTE — Patient Instructions (Signed)
Deep core level 3   30 reps x 2 x day   -----------------   Constance Haw with yellow band  10 x 2 , 2 x day   __________  R sided clams with yellow band and pillow between knees  5 x 2, x  2 x day    __________  Standing with yellow band Toe tap back 10x toe tap forward 10 x

## 2015-08-09 ENCOUNTER — Ambulatory Visit: Payer: PPO | Admitting: Physical Therapy

## 2015-08-09 DIAGNOSIS — R293 Abnormal posture: Secondary | ICD-10-CM

## 2015-08-09 DIAGNOSIS — M791 Myalgia, unspecified site: Secondary | ICD-10-CM

## 2015-08-09 DIAGNOSIS — R2689 Other abnormalities of gait and mobility: Secondary | ICD-10-CM

## 2015-08-10 NOTE — Therapy (Addendum)
[place] MAIN [facility] SERVICES [address] [place], [place], [address] Phone: [phone]   Fax:  [phone]  Physical Therapy Treatment  Patient Details  Name: Meagan Cox MRN: [id number] Date of Birth: 1940/08/10 Referring Provider: Derrel Nip MD  Encounter Date: 08/09/2015    Past Medical History  Diagnosis Date  . Hypertension   . Hyperlipidemia   . Hypothyroidism     Past Surgical History  Procedure Laterality Date  . Cardiac catheterization      30 years ago, [place]  . Tonsillectomy    . Partial hysterectomy    . Abdominal hysterectomy      There were no vitals filed for this visit.      Subjective Assessment - 08/17/15 1119    Subjective Pt reported she only had one incident where her leg was "catching" since last session. Pt stated "it seems like a miracle". Pt states she has been practicing using her belly mm when walking.    Pertinent History R medial thigh pain: Pt has trouble sleeping  on L side due to L arm pain  and tries to sleep on her back and belly.  Pt has difficulty sleeping on R  side due to R hip bursitis. Hx of R knee issues (meniscus "slipping out out place" and chriporactor helps to realign). Resolved for the past 1-2 years.   Gynecological: 3 vaginal birth,  without perineal trauma.  SUI . Denied saddle signs.  Pt travels to [place] 2x a year. Pt will be leaving in August for her next trip.     Patient Stated Goals standing long periods at the airport and traveling with less pain. "Quit hurting."            [facility] PT Assessment - 08/17/15 1119    Other:   Other/Comments stairclimbing with narrow BOS    AROM   Overall AROM Comments no pain with sidelying hip flexion, ext.    Palpation   Palpation comment minor restrictions along inguinal canal R                     [facility] Adult PT Treatment/Exercise - 08/17/15 1123    Therapeutic Activites    Therapeutic Activities --   stairclimbing gait training with cues 16 steps with single UE support 2 sets , progressions to exercises.    Exercises   Exercises Resisted walking with belt to increase weight bearing awareness in BLE:  10 reps, 7 lbs at waist   Walking with wider lateral steps to cones: 10 ft x 3 reps  Balance training on uneven surfaces:inside // bars: with intermittent UE support  10 reps walking on foam  10 reps walking on varied uneven surfaces (increased difficulty)   10 x 2 reps heel raises       Manual Therapy   Manual therapy comments manual lymph drainage over abdomen, fascial releases over R inguinal area ,                      PT Long Term Goals - 07/19/15 1101    PT LONG TERM GOAL #1   Title Pt will decrease her ODI score from 24% to < 20% in order to travel on her trips to [place] and walk long distances on uneven surfaces.   Time 12   Period Weeks   Status Partially Met   PT LONG TERM GOAL #2   Title Pt will decrease her PSQI  score from 29% to < 24% in order to improve sleep quality and demonstrate ability to lay in a comfortable position.    Time 12   Period Weeks   Status Partially Met   PT LONG TERM GOAL #3   Title Pt will be complaint with water intake in order to promote bladder health and hydration for muscles for walking.    Time 12   Status Achieved   PT LONG TERM GOAL #4   Title Pt will demo no pelvic asymmetries and increased hip PROM IR bilaterally > 10 deg in order to improve gait mechanics for ambulation.    Time 12   Period Weeks   Status Achieved   PT LONG TERM GOAL #5   Title Pt will demo no pain with sidestepping L with yellow resistive band at thigh across 5 steps in order to perform exercises and to return to walking longer distances and maintain proper alignment of hips and LE.   Time 12   Period Weeks   Status Partially Met   Additional Long Term Goals   Additional Long Term Goals Yes   PT LONG TERM GOAL #6   Title Pt will demo IND with  HEP   Time 12   Period Weeks   Status Partially Met               Plan - 08/17/15 1124    Clinical Impression Statement Pt has only had one "catching" episode with her RLE since last session. Pt was progressed to stairclimbing as well as dynamic balance training to simulate uneven surfaces that she will encounter on her trip to [place]. Pt demo'd improved dynamic stability with concentration and cues. Pt is progressing well towards her goals.    Rehab Potential Good   PT Frequency 2x / week   PT Duration 8 weeks   PT Treatment/Interventions ADLs/Self Care Home Management;Aquatic Therapy;Gait training;Traction;Moist Heat;Stair training;Functional mobility training;Therapeutic activities;Therapeutic exercise;Balance training;Neuromuscular re-education;Electrical Stimulation;Cryotherapy;Manual techniques;Patient/family education;Passive range of motion;Dry needling;Energy conservation   Consulted and Agree with Plan of Care Patient      Patient will benefit from skilled therapeutic intervention in order to improve the following deficits and impairments:  Abnormal gait, Pain, Improper body mechanics, Postural dysfunction, Other (comment), Decreased mobility, Decreased coordination, Decreased activity tolerance, Decreased endurance, Decreased balance, Decreased safety awareness, Difficulty walking, Impaired flexibility, Decreased range of motion, Obesity, Decreased strength, Decreased scar mobility, Increased muscle spasms, Cardiopulmonary status limiting activity  Visit Diagnosis: Other abnormalities of gait and mobility  Myalgia  Poor posture     Problem List Patient Active Problem List   Diagnosis Date Noted  . Medicare annual wellness visit, subsequent 07/24/2015  . Cerebrovascular small vessel disease 04/24/2015  . Benign paroxysmal positional vertigo 04/24/2015  . Pain in joint, pelvic region and thigh 04/24/2015  . Counseling for travel 01/11/2015  . Shoulder pain, right  11/22/2013  . Statin intolerance 10/08/2012  . Edema 05/14/2012  . Generalized anxiety disorder 01/06/2012  . 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 Hypothyroidism 12/18/2010  . Low back pain radiating to both legs 12/18/2010  . Hip pain, right 12/18/2010  . Obesity 05/09/2010  . CAD (coronary artery disease) 05/09/2010  . Hyperlipidemia 05/09/2010  . HTN (hypertension) 05/09/2010    Jerl Mina ,PT, DPT, E-RYT  08/17/2015, 11:26 AM  [place] MAIN [facility] SERVICES [address] [place], [place], [address] Phone: [phone]   Fax:  [phone]  Name: Meagan Cox MRN: [id number] Date  of Birth: 1940/09/19

## 2015-08-17 NOTE — Patient Instructions (Signed)
stairclimbing mechanics  continue with progressions of exercises

## 2015-08-19 ENCOUNTER — Ambulatory Visit: Payer: PPO | Admitting: Physical Therapy

## 2015-08-19 DIAGNOSIS — R293 Abnormal posture: Secondary | ICD-10-CM

## 2015-08-19 DIAGNOSIS — R2689 Other abnormalities of gait and mobility: Secondary | ICD-10-CM | POA: Diagnosis not present

## 2015-08-19 DIAGNOSIS — M791 Myalgia, unspecified site: Secondary | ICD-10-CM

## 2015-08-19 NOTE — Patient Instructions (Signed)
Hip flexor stretch,  Right leg back, slight bend of knee to stretch thigh muscle while front knee bent and stays over ankle    Bridging with red band 10 x 3   Single leg balance on even ground aim for 20 sec each leg, then practice on towels over carpet to create uneven surface

## 2015-08-20 NOTE — Therapy (Signed)
[facility] MAIN [facility] SERVICES [address] [place], [place], [address] Phone: [phone]   Fax:  [phone]  Physical Therapy Treatment  Patient Details  Name: Meagan Cox MRN: [id number] Date of Birth: 12-06-1940 Referring Provider: Darrick Huntsman MD  Encounter Date: 08/19/2015    Past Medical History  Diagnosis Date  . Hypertension   . Hyperlipidemia   . Hypothyroidism     Past Surgical History  Procedure Laterality Date  . Cardiac catheterization      30 years ago, [place]  . Tonsillectomy    . Partial hysterectomy    . Abdominal hysterectomy      Filed Vitals:        Subjective Assessment - 08/19/15 1410    Subjective Pt reported she has been trying to pick up her feet when walking. Pt 's leg has "done great" across the week and half. It had a catch only 3-4 times. Pt has been trying "cognizant " of how she moves.     Pertinent History R medial thigh pain: Pt has trouble sleeping  on L side due to L arm pain  and tries to sleep on her back and belly.  Pt has difficulty sleeping on R  side due to R hip bursitis. Hx of R knee issues (meniscus "slipping out out place" and chriporactor helps to realign). Resolved for the past 1-2 years.   Gynecological: 3 vaginal birth,  without perineal trauma.  SUI . Denied saddle signs.  Pt travels to [place] 2x a year. Pt will be leaving in August for her next trip.     Patient Stated Goals standing long periods at the airport and traveling with less pain. "Quit hurting."                         [facility] Adult PT Treatment/Exercise - 08/19/15 2342    Therapeutic Activites    Therapeutic Activities --  see pt instructions, progressed bridge (red band) and dynamic balance ( walking outside of parallel bars single UE support while walking along simulated uneven surfaces (half foam rollers and foam pads 10 ft x 6 reps), SLS 10-12 sec max B on pillow with 2 touches to wall for  support), semi tandem stance with perturbations at waist, walking 20 ft with manually applied perturbations.   Neuro Re-ed    Neuro Re-ed Details  see pt instructions  Cued for weightbear in all 4 corners of feet in R lateral hip shift in hip flexor stretch.                       PT Long Term Goals - 07/19/15 1101    PT LONG TERM GOAL #1   Title Pt will decrease her ODI score from 24% to < 20% in order to travel on her trips to [place] and walk long distances on uneven surfaces.   Time 12   Period Weeks   Status Partially Met   PT LONG TERM GOAL #2   Title Pt will decrease her PSQI score from 29% to < 24% in order to improve sleep quality and demonstrate ability to lay in a comfortable position.    Time 12   Period Weeks   Status Partially Met   PT LONG TERM GOAL #3   Title Pt will be complaint with water intake in order to promote bladder health and hydration for muscles for walking.    Time 12  Status Achieved   PT LONG TERM GOAL #4   Title Pt will demo no pelvic asymmetries and increased hip PROM IR bilaterally > 10 deg in order to improve gait mechanics for ambulation.    Time 12   Period Weeks   Status Achieved   PT LONG TERM GOAL #5   Title Pt will demo no pain with sidestepping L with yellow resistive band at thigh across 5 steps in order to perform exercises and to return to walking longer distances and maintain proper alignment of hips and LE.   Time 12   Period Weeks   Status Partially Met   Additional Long Term Goals   Additional Long Term Goals Yes   PT LONG TERM GOAL #6   Title Pt will demo IND with HEP   Time 12   Period Weeks   Status Partially Met               Plan - 08/20/15 0001    Clinical Impression Statement Pt is progressing well with her strengthening and dynamic balance exercises. Pt reported having to sit down today as she felt sweaty with a hot flash but was able to resume with the rest of the session. Pt reported soreness on  the R thigh with exercises but no pain. Pt will continue to benefit from skilled PT.    Rehab Potential Good   PT Frequency 2x / week   PT Duration 8 weeks   PT Treatment/Interventions ADLs/Self Care Home Management;Aquatic Therapy;Gait training;Traction;Moist Heat;Stair training;Functional mobility training;Therapeutic activities;Therapeutic exercise;Balance training;Neuromuscular re-education;Electrical Stimulation;Cryotherapy;Manual techniques;Patient/family education;Passive range of motion;Dry needling;Energy conservation   Consulted and Agree with Plan of Care Patient      Patient will benefit from skilled therapeutic intervention in order to improve the following deficits and impairments:  Abnormal gait, Pain, Improper body mechanics, Postural dysfunction, Other (comment), Decreased mobility, Decreased coordination, Decreased activity tolerance, Decreased endurance, Decreased balance, Decreased safety awareness, Difficulty walking, Impaired flexibility, Decreased range of motion, Obesity, Decreased strength, Decreased scar mobility, Increased muscle spasms, Cardiopulmonary status limiting activity  Visit Diagnosis: Other abnormalities of gait and mobility  Myalgia  Poor posture     Problem List Patient Active Problem List   Diagnosis Date Noted  . Medicare annual wellness visit, subsequent 07/24/2015  . Cerebrovascular small vessel disease 04/24/2015  . Benign paroxysmal positional vertigo 04/24/2015  . Pain in joint, pelvic region and thigh 04/24/2015  . Counseling for travel 01/11/2015  . Shoulder pain, right 11/22/2013  . Statin intolerance 10/08/2012  . Edema 05/14/2012  . Generalized anxiety disorder 01/06/2012  . 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 Hypothyroidism 12/18/2010  . Low back pain radiating to both legs 12/18/2010  . Hip pain, right 12/18/2010  . Obesity 05/09/2010  . CAD (coronary artery disease) 05/09/2010  . Hyperlipidemia 05/09/2010  . HTN  (hypertension) 05/09/2010    Jerl Mina ,PT, DPT, E-RYT  08/20/2015, 12:03 AM  [place] MAIN [facility] SERVICES [address] [place], [place], [address] Phone: [phone]   Fax:  [phone]  Name: Meagan Cox MRN: [id number] Date of Birth: January 09, 1941

## 2015-08-21 ENCOUNTER — Other Ambulatory Visit: Payer: Self-pay | Admitting: Emergency Medicine

## 2015-08-21 ENCOUNTER — Observation Stay
Admission: EM | Admit: 2015-08-21 | Discharge: 2015-08-22 | Disposition: A | Discharge status: Home / Self Care | Payer: PPO | Attending: Surgery | Admitting: Surgery

## 2015-08-21 ENCOUNTER — Emergency Department: Payer: PPO

## 2015-08-21 ENCOUNTER — Observation Stay: Payer: PPO | Admitting: Anesthesiology

## 2015-08-21 ENCOUNTER — Encounter
Admission: EM | Disposition: A | Discharge status: Home / Self Care | Payer: Self-pay | Source: Home / Self Care | Attending: Emergency Medicine

## 2015-08-21 ENCOUNTER — Other Ambulatory Visit: Payer: Self-pay

## 2015-08-21 ENCOUNTER — Encounter: Payer: Self-pay | Admitting: *Deleted

## 2015-08-21 DIAGNOSIS — R079 Chest pain, unspecified: Secondary | ICD-10-CM

## 2015-08-21 DIAGNOSIS — K8000 Calculus of gallbladder with acute cholecystitis without obstruction: Secondary | ICD-10-CM | POA: Diagnosis not present

## 2015-08-21 DIAGNOSIS — M1990 Unspecified osteoarthritis, unspecified site: Secondary | ICD-10-CM | POA: Insufficient documentation

## 2015-08-21 DIAGNOSIS — E785 Hyperlipidemia, unspecified: Secondary | ICD-10-CM | POA: Insufficient documentation

## 2015-08-21 DIAGNOSIS — Z6835 Body mass index (BMI) 35.0-35.9, adult: Secondary | ICD-10-CM | POA: Diagnosis not present

## 2015-08-21 DIAGNOSIS — E039 Hypothyroidism, unspecified: Secondary | ICD-10-CM | POA: Insufficient documentation

## 2015-08-21 DIAGNOSIS — K760 Fatty (change of) liver, not elsewhere classified: Secondary | ICD-10-CM | POA: Diagnosis not present

## 2015-08-21 DIAGNOSIS — I10 Essential (primary) hypertension: Secondary | ICD-10-CM | POA: Insufficient documentation

## 2015-08-21 DIAGNOSIS — R2689 Other abnormalities of gait and mobility: Secondary | ICD-10-CM | POA: Diagnosis not present

## 2015-08-21 DIAGNOSIS — E669 Obesity, unspecified: Secondary | ICD-10-CM | POA: Insufficient documentation

## 2015-08-21 DIAGNOSIS — Z87891 Personal history of nicotine dependence: Secondary | ICD-10-CM | POA: Diagnosis not present

## 2015-08-21 DIAGNOSIS — Z888 Allergy status to other drugs, medicaments and biological substances status: Secondary | ICD-10-CM | POA: Insufficient documentation

## 2015-08-21 DIAGNOSIS — I700 Atherosclerosis of aorta: Secondary | ICD-10-CM | POA: Diagnosis not present

## 2015-08-21 DIAGNOSIS — K819 Cholecystitis, unspecified: Secondary | ICD-10-CM | POA: Diagnosis not present

## 2015-08-21 DIAGNOSIS — K8020 Calculus of gallbladder without cholecystitis without obstruction: Secondary | ICD-10-CM | POA: Diagnosis not present

## 2015-08-21 DIAGNOSIS — Z880 Allergy status to penicillin: Secondary | ICD-10-CM | POA: Diagnosis not present

## 2015-08-21 DIAGNOSIS — K824 Cholesterolosis of gallbladder: Secondary | ICD-10-CM | POA: Diagnosis not present

## 2015-08-21 DIAGNOSIS — R1011 Right upper quadrant pain: Secondary | ICD-10-CM | POA: Diagnosis not present

## 2015-08-21 HISTORY — PX: CHOLECYSTECTOMY: SHX55

## 2015-08-21 LAB — CBC WITH DIFFERENTIAL/PLATELET
BASOS ABS: 0.1 10*3/uL (ref 0–0.1)
Basophils Relative: 1 %
Eosinophils Absolute: 0.3 10*3/uL (ref 0–0.7)
Eosinophils Relative: 2 %
HEMATOCRIT: 44.4 % (ref 35.0–47.0)
HEMOGLOBIN: 15.4 g/dL (ref 12.0–16.0)
LYMPHS ABS: 2.8 10*3/uL (ref 1.0–3.6)
LYMPHS PCT: 26 %
MCH: 31.4 pg (ref 26.0–34.0)
MCHC: 34.8 g/dL (ref 32.0–36.0)
MCV: 90.2 fL (ref 80.0–100.0)
Monocytes Absolute: 0.9 10*3/uL (ref 0.2–0.9)
Monocytes Relative: 8 %
NEUTROS ABS: 6.8 10*3/uL — AB (ref 1.4–6.5)
Neutrophils Relative %: 63 %
Platelets: 226 10*3/uL (ref 150–440)
RBC: 4.92 MIL/uL (ref 3.80–5.20)
RDW: 12.5 % (ref 11.5–14.5)
WBC: 10.9 10*3/uL (ref 3.6–11.0)

## 2015-08-21 LAB — TROPONIN I: Troponin I: <0.03 ng/mL (ref <0.03)

## 2015-08-21 LAB — BASIC METABOLIC PANEL
ANION GAP: 8 (ref 5–15)
BUN: 17 mg/dL (ref 6–20)
CHLORIDE: 104 mmol/L (ref 101–111)
CO2: 26 mmol/L (ref 22–32)
Calcium: 8.6 mg/dL — ABNORMAL LOW (ref 8.9–10.3)
Creatinine, Ser: 0.56 mg/dL (ref 0.44–1.00)
GFR calc Af Amer: >60 mL/min (ref >60)
GLUCOSE: 116 mg/dL — AB (ref 65–99)
POTASSIUM: 3.9 mmol/L (ref 3.5–5.1)
SODIUM: 138 mmol/L (ref 135–145)

## 2015-08-21 LAB — SURGICAL PCR SCREEN
MRSA, PCR: NEGATIVE
Staphylococcus aureus: POSITIVE — AB

## 2015-08-21 SURGERY — LAPAROSCOPIC CHOLECYSTECTOMY
Anesthesia: General

## 2015-08-21 SURGERY — LAPAROSCOPIC CHOLECYSTECTOMY WITH INTRAOPERATIVE CHOLANGIOGRAM
Anesthesia: General

## 2015-08-21 MED ORDER — BUPIVACAINE-EPINEPHRINE 0.25% -1:200000 IJ SOLN
INTRAMUSCULAR | Status: DC | PRN
  Administered 2015-08-21: 30 mL

## 2015-08-21 MED ORDER — LEVOTHYROXINE SODIUM 75 MCG PO TABS
150.0000 ug | ORAL_TABLET | Freq: Every day | ORAL | Status: DC
  Administered 2015-08-22: 150 ug via ORAL
  Filled 2015-08-21: qty 2

## 2015-08-21 MED ORDER — ONDANSETRON HCL 4 MG/2ML IJ SOLN
4.0000 mg | Freq: Four times a day (QID) | INTRAMUSCULAR | Status: DC | PRN
  Administered 2015-08-21: 4 mg via INTRAVENOUS
  Filled 2015-08-21: qty 2

## 2015-08-21 MED ORDER — KETOROLAC TROMETHAMINE 15 MG/ML IJ SOLN: 15.0000 mg | Freq: Four times a day (QID) | INTRAMUSCULAR | Status: DC | PRN

## 2015-08-21 MED ORDER — CIPROFLOXACIN IN D5W 400 MG/200ML IV SOLN
400.0000 mg | Freq: Two times a day (BID) | INTRAVENOUS | Status: DC
  Administered 2015-08-21 (×2): 400 mg via INTRAVENOUS
  Filled 2015-08-21 (×4): qty 200

## 2015-08-21 MED ORDER — PROPOFOL 10 MG/ML IV BOLUS
INTRAVENOUS | Status: DC | PRN
Start: 2015-08-21 — End: 2015-08-21
  Administered 2015-08-21: 200 mg via INTRAVENOUS

## 2015-08-21 MED ORDER — LACTATED RINGERS IV SOLN
INTRAVENOUS | Status: DC | PRN
  Administered 2015-08-21: 15:00:00 via INTRAVENOUS

## 2015-08-21 MED ORDER — MIDAZOLAM HCL 5 MG/5ML IJ SOLN
INTRAMUSCULAR | Status: DC | PRN
  Administered 2015-08-21: 2 mg via INTRAVENOUS

## 2015-08-21 MED ORDER — MORPHINE SULFATE (PF) 4 MG/ML IV SOLN
INTRAVENOUS | Status: AC
  Administered 2015-08-21: 4 mg via INTRAVENOUS
  Filled 2015-08-21: qty 1

## 2015-08-21 MED ORDER — CHLORHEXIDINE GLUCONATE CLOTH 2 % EX PADS
6.0000 | MEDICATED_PAD | Freq: Every day | CUTANEOUS | Status: DC
  Administered 2015-08-21: 6 via TOPICAL

## 2015-08-21 MED ORDER — ONDANSETRON HCL 4 MG/2ML IJ SOLN: 4.0000 mg | Freq: Once | INTRAMUSCULAR | Status: DC | PRN

## 2015-08-21 MED ORDER — KETOROLAC TROMETHAMINE 30 MG/ML IJ SOLN
INTRAMUSCULAR | Status: DC | PRN
  Administered 2015-08-21: 30 mg via INTRAVENOUS

## 2015-08-21 MED ORDER — OXYCODONE-ACETAMINOPHEN 5-325 MG PO TABS: 1.0000 | ORAL_TABLET | ORAL | Status: DC | PRN

## 2015-08-21 MED ORDER — ONDANSETRON 4 MG PO TBDP: 4.0000 mg | ORAL_TABLET | Freq: Four times a day (QID) | ORAL | Status: DC | PRN

## 2015-08-21 MED ORDER — METRONIDAZOLE IN NACL 5-0.79 MG/ML-% IV SOLN
500.0000 mg | Freq: Three times a day (TID) | INTRAVENOUS | Status: DC
  Administered 2015-08-21 – 2015-08-22 (×2): 500 mg via INTRAVENOUS
  Filled 2015-08-21 (×6): qty 100

## 2015-08-21 MED ORDER — KETOROLAC TROMETHAMINE 30 MG/ML IJ SOLN
30.0000 mg | Freq: Four times a day (QID) | INTRAMUSCULAR | Status: DC
  Administered 2015-08-21 – 2015-08-22 (×2): 30 mg via INTRAVENOUS
  Filled 2015-08-21 (×3): qty 1

## 2015-08-21 MED ORDER — MUPIROCIN 2 % EX OINT
1.0000 "application " | TOPICAL_OINTMENT | Freq: Two times a day (BID) | CUTANEOUS | Status: DC
  Administered 2015-08-21: 1 via NASAL
  Filled 2015-08-21 (×2): qty 22

## 2015-08-21 MED ORDER — ACETAMINOPHEN 10 MG/ML IV SOLN
INTRAVENOUS | Status: DC | PRN
  Administered 2015-08-21: 1000 mg via INTRAVENOUS

## 2015-08-21 MED ORDER — LIDOCAINE HCL (CARDIAC) 20 MG/ML IV SOLN
INTRAVENOUS | Status: DC | PRN
  Administered 2015-08-21: 30 mg via INTRAVENOUS

## 2015-08-21 MED ORDER — DIAZEPAM 5 MG PO TABS: 5.0000 mg | ORAL_TABLET | Freq: Two times a day (BID) | ORAL | Status: DC | PRN

## 2015-08-21 MED ORDER — LABETALOL HCL 5 MG/ML IV SOLN
INTRAVENOUS | Status: DC | PRN
  Administered 2015-08-21: 10 mg via INTRAVENOUS

## 2015-08-21 MED ORDER — DEXAMETHASONE SODIUM PHOSPHATE 10 MG/ML IJ SOLN
INTRAMUSCULAR | Status: DC | PRN
  Administered 2015-08-21: 5 mg via INTRAVENOUS

## 2015-08-21 MED ORDER — MORPHINE SULFATE (PF) 4 MG/ML IV SOLN
4.0000 mg | Freq: Once | INTRAVENOUS | Status: AC
  Administered 2015-08-21: 4 mg via INTRAVENOUS

## 2015-08-21 MED ORDER — CEFAZOLIN SODIUM-DEXTROSE 2-3 GM-% IV SOLR
INTRAVENOUS | Status: DC | PRN
  Administered 2015-08-21: 2 g via INTRAVENOUS

## 2015-08-21 MED ORDER — SUGAMMADEX SODIUM 200 MG/2ML IV SOLN
INTRAVENOUS | Status: DC | PRN
  Administered 2015-08-21: 200 mg via INTRAVENOUS

## 2015-08-21 MED ORDER — ONDANSETRON HCL 4 MG/2ML IJ SOLN
INTRAMUSCULAR | Status: DC | PRN
  Administered 2015-08-21: 4 mg via INTRAVENOUS

## 2015-08-21 MED ORDER — BUPIVACAINE-EPINEPHRINE (PF) 0.25% -1:200000 IJ SOLN
INTRAMUSCULAR | Status: AC
  Filled 2015-08-21: qty 30

## 2015-08-21 MED ORDER — DEXTROSE IN LACTATED RINGERS 5 % IV SOLN
INTRAVENOUS | Status: DC
  Administered 2015-08-21 – 2015-08-22 (×2): via INTRAVENOUS
  Filled 2015-08-21 (×3): qty 1000

## 2015-08-21 MED ORDER — DIPHENHYDRAMINE HCL 50 MG/ML IJ SOLN
INTRAMUSCULAR | Status: DC | PRN
  Administered 2015-08-21: 25 mg via INTRAVENOUS

## 2015-08-21 MED ORDER — FENTANYL CITRATE (PF) 100 MCG/2ML IJ SOLN: 25.0000 ug | INTRAMUSCULAR | Status: DC | PRN

## 2015-08-21 MED ORDER — ACETAMINOPHEN 10 MG/ML IV SOLN
INTRAVENOUS | Status: AC
  Filled 2015-08-21: qty 100

## 2015-08-21 MED ORDER — FAMOTIDINE IN NACL 20-0.9 MG/50ML-% IV SOLN
20.0000 mg | Freq: Two times a day (BID) | INTRAVENOUS | Status: DC
  Administered 2015-08-21: 20 mg via INTRAVENOUS
  Filled 2015-08-21 (×4): qty 50

## 2015-08-21 MED ORDER — FENTANYL CITRATE (PF) 100 MCG/2ML IJ SOLN
INTRAMUSCULAR | Status: DC | PRN
  Administered 2015-08-21: 100 ug via INTRAVENOUS

## 2015-08-21 MED ORDER — MORPHINE SULFATE (PF) 2 MG/ML IV SOLN
2.0000 mg | INTRAVENOUS | Status: DC | PRN
  Administered 2015-08-21 (×2): 2 mg via INTRAVENOUS
  Filled 2015-08-21 (×2): qty 1

## 2015-08-21 SURGICAL SUPPLY — 44 items
APPLICATOR COTTON TIP 6IN STRL (MISCELLANEOUS) ×3 IMPLANT
APPLIER CLIP 5 13 M/L LIGAMAX5 (MISCELLANEOUS) ×3
APR CLP MED LRG 5 ANG JAW (MISCELLANEOUS) ×1
BLADE SURG 15 STRL LF DISP TIS (BLADE) ×1 IMPLANT
BLADE SURG 15 STRL SS (BLADE) ×3
CANISTER SUCT 1200ML W/VALVE (MISCELLANEOUS) ×3 IMPLANT
CHLORAPREP W/TINT 26ML (MISCELLANEOUS) ×3 IMPLANT
CHOLANGIOGRAM CATH TAUT (CATHETERS) IMPLANT
CLEANER CAUTERY TIP 5X5 PAD (MISCELLANEOUS) ×1 IMPLANT
CLIP APPLIE 5 13 M/L LIGAMAX5 (MISCELLANEOUS) ×1 IMPLANT
DECANTER SPIKE VIAL GLASS SM (MISCELLANEOUS) ×6 IMPLANT
DEVICE TROCAR PUNCTURE CLOSURE (ENDOMECHANICALS) IMPLANT
DRAPE C-ARM XRAY 36X54 (DRAPES) ×3 IMPLANT
ELECT REM PT RETURN 9FT ADLT (ELECTROSURGICAL) ×3
ELECTRODE REM PT RTRN 9FT ADLT (ELECTROSURGICAL) ×1 IMPLANT
ENDOPOUCH RETRIEVER 10 (MISCELLANEOUS) ×3 IMPLANT
GLOVE BIO SURGEON STRL SZ7 (GLOVE) ×3 IMPLANT
GOWN STRL REUS W/ TWL LRG LVL3 (GOWN DISPOSABLE) ×3 IMPLANT
GOWN STRL REUS W/TWL LRG LVL3 (GOWN DISPOSABLE) ×9
IRRIGATION STRYKERFLOW (MISCELLANEOUS) ×1 IMPLANT
IRRIGATOR STRYKERFLOW (MISCELLANEOUS) ×3
IV CATH ANGIO 12GX3 LT BLUE (NEEDLE) ×3 IMPLANT
IV SOD CHL 0.9% 1000ML (IV SOLUTION) ×3 IMPLANT
L-HOOK LAP DISP 36CM (ELECTROSURGICAL) ×3
LHOOK LAP DISP 36CM (ELECTROSURGICAL) ×1 IMPLANT
LIQUID BAND (GAUZE/BANDAGES/DRESSINGS) ×3 IMPLANT
NEEDLE HYPO 22GX1.5 SAFETY (NEEDLE) ×3 IMPLANT
PACK LAP CHOLECYSTECTOMY (MISCELLANEOUS) ×3 IMPLANT
PAD CLEANER CAUTERY TIP 5X5 (MISCELLANEOUS) ×2
PENCIL ELECTRO HAND CTR (MISCELLANEOUS) ×3 IMPLANT
SCISSORS METZENBAUM CVD 33 (INSTRUMENTS) ×3 IMPLANT
SLEEVE ENDOPATH XCEL 5M (ENDOMECHANICALS) ×6 IMPLANT
SOL ANTI-FOG 6CC FOG-OUT (MISCELLANEOUS) ×1 IMPLANT
SOL FOG-OUT ANTI-FOG 6CC (MISCELLANEOUS) ×2
STOPCOCK 3 WAY  REPLAC (MISCELLANEOUS) IMPLANT
SUT ETHIBOND 0 MO6 C/R (SUTURE) IMPLANT
SUT MNCRL AB 4-0 PS2 18 (SUTURE) ×3 IMPLANT
SUT VIC AB 0 CT2 27 (SUTURE) IMPLANT
SUT VICRYL 0 AB UR-6 (SUTURE) ×6 IMPLANT
SYR 20CC LL (SYRINGE) ×3 IMPLANT
TROCAR XCEL BLUNT TIP 100MML (ENDOMECHANICALS) ×3 IMPLANT
TROCAR XCEL NON-BLD 5MMX100MML (ENDOMECHANICALS) ×3 IMPLANT
TUBING INSUFFLATOR HI FLOW (MISCELLANEOUS) ×3 IMPLANT
WATER STERILE IRR 1000ML POUR (IV SOLUTION) ×3 IMPLANT

## 2015-08-21 NOTE — Op Note (Signed)
Laparoscopic Cholecystectomy  Pre-operative Diagnosis: Acute cholecystitis  Post-operative Diagnosis: same  Procedure: laparoscopic cholecystectomy  Surgeon: Caroleen Hamman, MD FACS  Anesthesia: Gen. with endotracheal tube     Findings: Acute Cholecystitis   Estimated Blood Loss: 10cc         Drains: none         Specimens: Gallbladder           Complications: none   Procedure Details  The patient was seen again in the Holding Room. The benefits, complications, treatment options, and expected outcomes were discussed with the patient. The risks of bleeding, infection, recurrence of symptoms, failure to resolve symptoms, bile duct damage, bile duct leak, retained common bile duct stone, bowel injury, any of which could require further surgery and/or ERCP, stent, or papillotomy were reviewed with the patient. The likelihood of improving the patient's symptoms with return to their baseline status is good.  The patient and/or family concurred with the proposed plan, giving informed consent.  The patient was taken to Operating Room, identified as Meagan Cox and the procedure verified as Laparoscopic Cholecystectomy.  A Time Out was held and the above information confirmed.  Prior to the induction of general anesthesia, antibiotic prophylaxis was administered. VTE prophylaxis was in place. General endotracheal anesthesia was then administered and tolerated well. After the induction, the abdomen was prepped with Chloraprep and draped in the sterile fashion. The patient was positioned in the supine position.  Local anesthetic  was injected into the skin near the umbilicus and an incision made. Cut down technique was used to enter the abdominal cavity and a Hasson trochar was placed after two vicryl stitches were anchored to the fascia. Pneumoperitoneum was then created with CO2 and tolerated well without any adverse changes in the patient's vital signs.  Three 5-mm ports were placed in  the right upper quadrant all under direct vision. All skin incisions  were infiltrated with a local anesthetic agent before making the incision and placing the trocars.   The patient was positioned  in reverse Trendelenburg, tilted slightly to the patient's left.  The gallbladder was identified, the fundus grasped and retracted cephalad. Adhesions were lysed bluntly. Hydrops evacuated. The infundibulum was grasped and retracted laterally, exposing the peritoneum overlying the triangle of Calot. This was then divided and exposed in a blunt fashion. An extended critical view of the cystic duct and cystic artery was obtained.  The cystic duct was clearly identified and bluntly dissected.   Artery and duct were double clipped and divided.  The gallbladder was taken from the gallbladder fossa in a retrograde fashion with the electrocautery. The gallbladder was removed and placed in an Endocatch bag. The liver bed was irrigated and inspected. Hemostasis was achieved with the electrocautery. Copious irrigation was utilized and was repeatedly aspirated until clear.  The gallbladder and Endocatch sac were then removed through the epigastric port site.    Inspection of the right upper quadrant was performed. No bleeding, bile duct injury or leak, or bowel injury was noted. Pneumoperitoneum was released.  The periumbilical port site was closed with figure-of-eight 0 Vicryl sutures. 4-0 subcuticular Monocryl was used to close the skin. Dermabond was  applied.  The patient was then extubated and brought to the recovery room in stable condition. Sponge, lap, and needle counts were correct at closure and at the conclusion of the case.               Caroleen Hamman, MD, FACS

## 2015-08-21 NOTE — Anesthesia Procedure Notes (Signed)
Procedure Name: Intubation Date/Time: 08/21/2015 3:20 PM Performed by: Eliberto Ivory Pre-anesthesia Checklist: Patient identified, Patient being monitored, Timeout performed, Emergency Drugs available and Suction available Patient Re-evaluated:Patient Re-evaluated prior to inductionOxygen Delivery Method: Circle system utilized Preoxygenation: Pre-oxygenation with 100% oxygen Intubation Type: IV induction Ventilation: Mask ventilation without difficulty Laryngoscope Size: Mac and 3 Grade View: Grade I Tube type: Oral Tube size: 7.0 mm Number of attempts: 1 Airway Equipment and Method: Stylet Placement Confirmation: ETT inserted through vocal cords under direct vision,  positive ETCO2 and breath sounds checked- equal and bilateral Secured at: 21 cm Tube secured with: Tape Dental Injury: Teeth and Oropharynx as per pre-operative assessment

## 2015-08-21 NOTE — Progress Notes (Signed)
Dr. Dahlia Byes was notified that patient;s MRSA pcr swab is positive for staph areus

## 2015-08-21 NOTE — ED Notes (Signed)
Pt reports that pain has decreased.  Pt resting comfortably and appears in no acute distress at this time.

## 2015-08-21 NOTE — ED Notes (Signed)
Pt upset about her wait time for [place] in the ED. Explained to pt that computer system was down the previous shift and there is only 1 [place] tech at this time. Pt states she understands, she is just "beginning to get pissed." [place] called, tech stated there are 2 other pts before this pt. Pt updated on status. Pt requesting ice chips for dry mouth. EDP notified, stated it is ok for pt to have small amount of ice chips. Offered for pt to speak to charge nurse or patient relations; pt declines at this time.

## 2015-08-21 NOTE — ED Notes (Signed)
Report given to next shift RN [place] L.

## 2015-08-21 NOTE — ED Notes (Signed)
See paper chart for downtime triage and charting.

## 2015-08-21 NOTE — ED Provider Notes (Addendum)
-----------------------------------------   8:13 AM on 08/21/2015 -----------------------------------------  Signed out to me at 7 AM, patient stable overnight, right upper quadrant ultrasound pending.   Schuyler Amor, MD 08/21/15 0813  ----------------------------------------- 9:44 AM on 08/21/2015 -----------------------------------------  Patient with evidence of cholecystitis, surgery will come evaluate at this time. Minimal discomfort, but yet persistent discomfort noted. nontoxic in appearance   Schuyler Amor, MD 08/21/15 [phone]

## 2015-08-21 NOTE — ED Notes (Signed)
Pt refused BP cuff at this time. States "that thing has been squeezing my arm all night."

## 2015-08-21 NOTE — Anesthesia Postprocedure Evaluation (Signed)
Anesthesia Post Note  Patient: Meagan Cox  Procedure(s) Performed: Procedure(s) (LRB): LAPAROSCOPIC CHOLECYSTECTOMY (N/A)  Patient location during evaluation: PACU Anesthesia Type: General Level of consciousness: awake and alert Pain management: pain level controlled Vital Signs Assessment: post-procedure vital signs reviewed and stable Respiratory status: spontaneous breathing, nonlabored ventilation, respiratory function stable and patient connected to nasal cannula oxygen Cardiovascular status: blood pressure returned to baseline and stable Postop Assessment: no signs of nausea or vomiting Anesthetic complications: no    Last Vitals:  Vitals:   08/21/15 1720 08/21/15 1800  BP: 133/67 140/62  Pulse: 66 64  Resp:    Temp: 36.4 C 36.6 C    Last Pain:  Vitals:   08/21/15 1800  TempSrc: Oral  PainSc:                  Xayla Puzio S

## 2015-08-21 NOTE — Care Management Obs Status (Signed)
[place] NOTIFICATION   Patient Details  Name: Meagan Cox MRN: [id number] Date of Birth: [birth year]   Medicare Observation Status Notification Given:  Yes    Ival Bible, RN 08/21/2015, 11:25 AM

## 2015-08-21 NOTE — ED Provider Notes (Signed)
-----------------------------------------   6:45 AM on 08/21/2015 -----------------------------------------  See downtime paper chart  In summary the patient presents with epigastric and right upper quadrant pain. Her labs including LFTs are largely within normal limits. Patient's upper abdominal pain has returned, I have redosed morphine. Currently pending right upper quadrant ultrasound as well as urinalysis.  Patient care signed out to Dr. Burlene Arnt.   Harvest Dark, MD 08/21/15 [phone]

## 2015-08-21 NOTE — Anesthesia Preprocedure Evaluation (Addendum)
Anesthesia Evaluation  Patient identified by MRN, date of birth, ID band Patient awake    Reviewed: Allergy & Precautions, NPO status , Patient's Chart, lab work & pertinent test results, reviewed documented beta blocker date and time   Airway Mallampati: II  TM Distance: >3 FB     Dental  (+) Chipped   Pulmonary former smoker,           Cardiovascular hypertension, + CAD       Neuro/Psych Anxiety    GI/Hepatic   Endo/Other  Hypothyroidism   Renal/GU      Musculoskeletal  (+) Arthritis ,   Abdominal   Peds  Hematology   Anesthesia Other Findings Obese. EKG OK. CXR OK.  Reproductive/Obstetrics                            Anesthesia Physical Anesthesia Plan  ASA: II  Anesthesia Plan: General   Post-op Pain Management:    Induction: Intravenous  Airway Management Planned: Oral ETT  Additional Equipment:   Intra-op Plan:   Post-operative Plan:   Informed Consent: I have reviewed the patients History and Physical, chart, labs and discussed the procedure including the risks, benefits and alternatives for the proposed anesthesia with the patient or authorized representative who has indicated his/her understanding and acceptance.     Plan Discussed with: CRNA  Anesthesia Plan Comments:         Anesthesia Quick Evaluation

## 2015-08-21 NOTE — Anesthesia Preprocedure Evaluation (Deleted)
Anesthesia Evaluation  Patient identified by MRN, date of birth, ID band Patient awake    Reviewed: Allergy & Precautions, NPO status , Patient's Chart, lab work & pertinent test results, reviewed documented beta blocker date and time   Airway Mallampati: III  TM Distance: >3 FB     Dental  (+) Chipped   Pulmonary former smoker,           Cardiovascular hypertension, + CAD       Neuro/Psych Anxiety    GI/Hepatic   Endo/Other  Hypothyroidism   Renal/GU      Musculoskeletal  (+) Arthritis ,   Abdominal   Peds  Hematology   Anesthesia Other Findings Obese.  Reproductive/Obstetrics                             Anesthesia Physical Anesthesia Plan  ASA: III  Anesthesia Plan: General   Post-op Pain Management:    Induction: Intravenous  Airway Management Planned: Oral ETT  Additional Equipment:   Intra-op Plan:   Post-operative Plan:   Informed Consent: I have reviewed the patients History and Physical, chart, labs and discussed the procedure including the risks, benefits and alternatives for the proposed anesthesia with the patient or authorized representative who has indicated his/her understanding and acceptance.     Plan Discussed with: CRNA  Anesthesia Plan Comments:         Anesthesia Quick Evaluation

## 2015-08-21 NOTE — H&P (Signed)
Patient ID: Meagan Cox, female   DOB: Dec 28, 1940, 75 y.o.   MRN: [id number]  HPI Meagan Cox is a 75 y.o. female seen in the emergency room for abdominal pain. She reports that yesterday started experiencing actually back pain, 2 days ago with generalized weakness and some constitutional symptoms. An today now the pain is also on the right upper quadrant and epigastric area. Associated with nausea vomiting and decreased appetite. No evidence of biliary obstruction, no evidence of jaundice. No fevers no chills. She has never had any episodes of this before. She has had a history of hysterectomy in the past. He she does have good cardiovascular reserve and is able to perform more than 4 Mets of activity without any shortness of breath or chest pain.  The workup including an ultrasound revealing evidence of cholelithiasis with some cortical cystic fluid and dilated gallbladder consistent with cholecystitis. Normal common bile duct. LFTs are normal  HPI  Past Medical History:  Diagnosis Date  . Hyperlipidemia   . Hypertension   . Hypothyroidism     Past Surgical History:  Procedure Laterality Date  . ABDOMINAL HYSTERECTOMY    . CARDIAC CATHETERIZATION     30 years ago, [place]  . PARTIAL HYSTERECTOMY    . TONSILLECTOMY      Family History  Problem Relation Age of Onset  . Heart disease Mother     heart problems  . Hypothyroidism Mother   . Cancer Father     lymphoma    Social History Social History  Substance Use Topics  . Smoking status: Former Smoker    Years: 20.00    Types: Cigarettes    Quit date: 01/30/1988  . Smokeless tobacco: Never Used  . Alcohol use 2.4 oz/week    4 Glasses of wine per week     Comment: social/occasional    Allergies  Allergen Reactions  . Predicort [Prednisolone] Shortness Of Breath and Palpitations  . Penicillins Hives and Swelling    Current Facility-Administered Medications  Medication Dose Route Frequency Provider Last  Rate Last Dose  . ciprofloxacin (CIPRO) IVPB 400 mg  400 mg Intravenous Q12H Diego F Pabon, MD       And  . metroNIDAZOLE (FLAGYL) IVPB 500 mg  500 mg Intravenous Q8H Diego F Pabon, MD      . dextrose 5 % in lactated ringers infusion   Intravenous Continuous Diego F Pabon, MD      . famotidine (PEPCID) IVPB 20 mg premix  20 mg Intravenous Q12H Diego F Pabon, MD      . ketorolac (TORADOL) 15 MG/ML injection 15 mg  15 mg Intravenous Q6H PRN Diego F Pabon, MD      . morphine 2 MG/ML injection 2 mg  2 mg Intravenous Q2H PRN Diego F Pabon, MD      . ondansetron (ZOFRAN-ODT) disintegrating tablet 4 mg  4 mg Oral Q6H PRN Diego F Pabon, MD       Or  . ondansetron (ZOFRAN) injection 4 mg  4 mg Intravenous Q6H PRN Jules Husbands, MD       Current Outpatient Prescriptions  Medication Sig Dispense Refill  . Cyanocobalamin (B-12) 2500 MCG TABS Take 2,500 mcg by mouth daily. Reported on 07/22/2015    . estradiol (ESTRACE) 2 MG tablet TAKE ONE TABLET BY MOUTH ONCE DAILY 90 tablet 3  . levothyroxine (SYNTHROID, LEVOTHROID) 150 MCG tablet Take 150 mcg by mouth daily before breakfast.    . Multiple Vitamin (  MULTIVITAMIN) tablet Take 1 tablet by mouth daily.      . Red Yeast Rice 600 MG CAPS Take 1 capsule (600 mg total) by mouth 2 (two) times daily. 60 capsule 11  . ALPRAZolam (XANAX) 0.5 MG tablet Take 1 tablet (0.5 mg total) by mouth at bedtime as needed. (Patient not taking: Reported on 07/22/2015) 30 tablet 3  . benzonatate (TESSALON) 100 MG capsule Take 2 capsules (200 mg total) by mouth 2 (two) times daily as needed for cough. (Patient not taking: Reported on 07/22/2015) 60 capsule 0  . diazepam (VALIUM) 5 MG tablet Take 1 tablet (5 mg total) by mouth every 12 (twelve) hours as needed for anxiety. (Patient not taking: Reported on 07/22/2015) 30 tablet 1  . diclofenac (CATAFLAM) 50 MG tablet TAKE ONE TABLET BY MOUTH TWICE DAILY 180 tablet 1  . Ivermectin 1 % CREA Apply 1 application topically daily. 30 g 0  .  levothyroxine (SYNTHROID, LEVOTHROID) 125 MCG tablet TAKE ONE TABLET BY MOUTH ONCE DAILY 90 tablet 3  . predniSONE (DELTASONE) 10 MG tablet 6 tablets on Day 1 , then reduce by 1 tablet daily until gone (Patient not taking: Reported on 07/22/2015) 21 tablet 0  . Tdap (BOOSTRIX) 5-2.5-18.5 LF-MCG/0.5 injection Inject 0.5 mLs into the muscle once. (Patient not taking: Reported on 08/21/2015) 0.5 mL 0     Review of Systems A 10 point review of systems was asked and was negative except for the information on the HPI  Physical Exam Blood pressure (!) 167/76, pulse 70, resp. rate 15, SpO2 97 %. CONSTITUTIONAL: NAD. EYES: Pupils are equal, round, and reactive to light, Sclera are non-icteric. EARS, NOSE, MOUTH AND THROAT: The oropharynx is clear. The oral mucosa is pink and moist. Hearing is intact to voice. LYMPH NODES:  Lymph nodes in the neck are normal. RESPIRATORY:  Lungs are clear. There is normal respiratory effort, with equal breath sounds bilaterally, and without pathologic use of accessory muscles. CARDIOVASCULAR: Heart is regular without murmurs, gallops, or rubs. GI: The abdomen is soft, RUQ and epigastric pain , + murphy sign, no peritonitis.  There are no palpable masses. There is no hepatosplenomegaly. There are normal bowel sounds in all quadrants. GU: Rectal deferred.   MUSCULOSKELETAL: Normal muscle strength and tone. No cyanosis or edema.   SKIN: Turgor is good and there are no pathologic skin lesions or ulcers. NEUROLOGIC: Motor and sensation is grossly normal. Cranial nerves are grossly intact. PSYCH:  Oriented to person, place and time. Affect is normal.  Data Reviewed I have personally reviewed the patient's imaging, laboratory findings and medical records.    Assessment/ Plan Abd pain consistent with acute cholecystitis confirmed by ultrasound. Discussed with the patient in detail about my recommendation for cholecystectomy.The risks, benefits, complications, treatment  options, and expected outcomes were discussed with the patient. The possibilities of bleeding, recurrent infection, finding a normal gallbladder, perforation of viscus organs, damage to surrounding structures, bile leak, abscess formation, needing a drain placed, the need for additional procedures, reaction to medication, pulmonary aspiration,  failure to diagnose a condition, the possible need to convert to an open procedure, and creating a complication requiring transfusion or operation were discussed with the patient. The patient and/or family concurred with the proposed plan, giving informed consent.  We will admit , [place IV fluids and antibiotics and perform chole today as OR schedule allows.   Caroleen Hamman, MD FACS General Surgeon 08/21/2015, 10:59 AM

## 2015-08-21 NOTE — Transfer of Care (Signed)
Immediate Anesthesia Transfer of Care Note  Patient: Meagan Cox  Procedure(s) Performed: Procedure(s): LAPAROSCOPIC CHOLECYSTECTOMY (N/A)  Patient Location: PACU  Anesthesia Type:General  Level of Consciousness: Alert, Awake, Oriented  Airway & Oxygen Therapy: Patient Spontanous Breathing  Post-op Assessment: Report given to RN  Post vital signs: Reviewed and stable  Last Vitals:  Vitals:   08/21/15 1223 08/21/15 1619  BP: (!) 164/84 135/61  Pulse:  69  Resp:  12  Temp:  123XX123 C    Complications: No apparent anesthesia complications

## 2015-08-22 ENCOUNTER — Encounter: Payer: Self-pay | Admitting: Surgery

## 2015-08-22 ENCOUNTER — Telehealth: Payer: Self-pay | Admitting: *Deleted

## 2015-08-22 MED ORDER — OXYCODONE-ACETAMINOPHEN 5-325 MG PO TABS: 1.0000 | ORAL_TABLET | ORAL | 0 refills | Status: DC | PRN

## 2015-08-22 NOTE — Discharge Summary (Signed)
Physician Discharge Summary  Patient ID: Meagan Cox MRN: [id number] DOB/AGE: 1940-05-08 75 y.o.  Admit date: 08/21/2015 Discharge date: 08/22/2015  Admission Diagnoses: Acute cholecystitis   Discharge Diagnoses:  Active Problems:   Cholecystitis   Discharged Condition: good  Hospital Course: 75 yr old female with acute cholecystitis.  Patient doing well this AM, states pain well controlled.  She is not having any difficulty eating.  She is up and walking and is anxious to go home.   Consults: None  Significant Diagnostic Studies: U/S  Treatments: IV hydration and surgery: Lap chole   Discharge Exam: Blood pressure (!) 174/70, pulse 61, temperature 98 F (36.7 C), temperature source Oral, resp. rate 17, height 5\' 3"  (1.6 m), weight 212 lb 3.2 oz (96.3 kg), SpO2 99 %. General appearance: alert, cooperative and no distress GI: soft, appropriately tender, incisions c/d/i Extremities: extremities normal, atraumatic, no cyanosis or edema  Disposition: Final discharge disposition not confirmed  Discharge Instructions    Call MD for:  persistant nausea and vomiting    Complete by:  As directed   Call MD for:  redness, tenderness, or signs of infection (pain, swelling, redness, odor or green/yellow discharge around incision site)    Complete by:  As directed   Call MD for:  severe uncontrolled pain    Complete by:  As directed   Call MD for:  temperature >100.4    Complete by:  As directed   Diet - low sodium heart healthy    Complete by:  As directed   Driving Restrictions    Complete by:  As directed   No driving while on prescription pain medication   Increase activity slowly    Complete by:  As directed   Lifting restrictions    Complete by:  As directed   No lifting over 15lbs for 3 weeks   May shower / Bathe    Complete by:  As directed       Medication List    TAKE these medications   ALPRAZolam 0.5 MG tablet Commonly known as:  XANAX Take 1 tablet (0.5 mg  total) by mouth at bedtime as needed.   B-12 2500 MCG Tabs Take 2,500 mcg by mouth daily. Reported on 07/22/2015   benzonatate 100 MG capsule Commonly known as:  TESSALON Take 2 capsules (200 mg total) by mouth 2 (two) times daily as needed for cough.   diazepam 5 MG tablet Commonly known as:  VALIUM Take 1 tablet (5 mg total) by mouth every 12 (twelve) hours as needed for anxiety.   diclofenac 50 MG tablet Commonly known as:  CATAFLAM TAKE ONE TABLET BY MOUTH TWICE DAILY   estradiol 2 MG tablet Commonly known as:  ESTRACE TAKE ONE TABLET BY MOUTH ONCE DAILY   Ivermectin 1 % Crea Apply 1 application topically daily.   levothyroxine 150 MCG tablet Commonly known as:  SYNTHROID, LEVOTHROID Take 150 mcg by mouth daily before breakfast.   levothyroxine 125 MCG tablet Commonly known as:  SYNTHROID, LEVOTHROID TAKE ONE TABLET BY MOUTH ONCE DAILY   multivitamin tablet Take 1 tablet by mouth daily.   oxyCODONE-acetaminophen 5-325 MG tablet Commonly known as:  PERCOCET/ROXICET Take 1-2 tablets by mouth every 4 (four) hours as needed for moderate pain.   predniSONE 10 MG tablet Commonly known as:  DELTASONE 6 tablets on Day 1 , then reduce by 1 tablet daily until gone   Red Yeast Rice 600 MG Caps Take 1 capsule (600 mg total)  by mouth 2 (two) times daily.   Tdap 5-2.5-18.5 LF-MCG/0.5 injection Commonly known as:  BOOSTRIX Inject 0.5 mLs into the muscle once.      Follow-up Information    [facility] Follow up in 2 week(s).   Why:  f/u in 2 to 3 weeks with Dr. Dahlia Byes          Signed: Lavona Mound Loflin 08/22/2015, 11:52 AM

## 2015-08-22 NOTE — Telephone Encounter (Signed)
Patient had a gall bladder surgery on 08/21/15. She was advised to follow up with [facility]. She did not feel comfortable following up with them, and wanted to schedule with Dr. Derrel Nip. Please give a time and date to schedule pt on Dr. Derrel Nip schedule if she chooses to see pt.  Pt contact [phone]

## 2015-08-22 NOTE — Progress Notes (Signed)
08/22/2015 12:20 PM  BP (!) 174/70 (BP Location: Left Arm)   Pulse 61   Temp 98 F (36.7 C) (Oral)   Resp 17   Ht 5\' 3"  (1.6 m)   Wt 96.3 kg (212 lb 3.2 oz)   SpO2 99%   BMI 37.59 kg/m  Patient discharged per MD orders. Discharge instructions reviewed with patient and patient verbalized understanding. IV removed per policy. Prescriptions discussed and given to patient. Discharged via wheelchair escorted by auxilary.  Almedia Balls, RN

## 2015-08-22 NOTE — Care Management Important Message (Signed)
Important Message  Patient Details  Name: Meagan Cox MRN: [id number] Date of Birth: 12/14/1940   Medicare Important Message Given:  Yes    Ameliah Baskins A, RN 08/22/2015, 7:46 AM

## 2015-08-22 NOTE — Telephone Encounter (Signed)
Post surgery patients need to follow up with the surgeon who did their surgery before they see me for anything else.  They need to check the wound etc.

## 2015-08-22 NOTE — Telephone Encounter (Signed)
Patient needs to follow up with surgeon first, per Dr. Derrel Nip, thanks

## 2015-08-22 NOTE — Telephone Encounter (Signed)
Please advise on a date and time for appt, thanks

## 2015-08-23 LAB — SURGICAL PATHOLOGY

## 2015-08-23 NOTE — Telephone Encounter (Signed)
Patient was informed about Dr. Derrel Nip statement.

## 2015-08-29 DIAGNOSIS — Z1212 Encounter for screening for malignant neoplasm of rectum: Secondary | ICD-10-CM | POA: Diagnosis not present

## 2015-08-29 DIAGNOSIS — Z1211 Encounter for screening for malignant neoplasm of colon: Secondary | ICD-10-CM | POA: Diagnosis not present

## 2015-08-29 LAB — COLOGUARD: Cologuard: NEGATIVE

## 2015-08-30 ENCOUNTER — Ambulatory Visit: Payer: PPO | Attending: Internal Medicine | Admitting: Physical Therapy

## 2015-08-30 DIAGNOSIS — M791 Myalgia, unspecified site: Secondary | ICD-10-CM

## 2015-08-30 DIAGNOSIS — R293 Abnormal posture: Secondary | ICD-10-CM | POA: Insufficient documentation

## 2015-08-30 DIAGNOSIS — R2689 Other abnormalities of gait and mobility: Secondary | ICD-10-CM | POA: Insufficient documentation

## 2015-08-30 NOTE — Patient Instructions (Signed)
[email or web address]  Stretches after walking:  Hip/ calf stretch  Seated hip flexor stretch before the point of pain

## 2015-08-30 NOTE — Therapy (Signed)
[place] MAIN [facility] SERVICES [address] [place], [place], [address] Phone: [phone]   Fax:  [phone]  Physical Therapy Treatment  Patient Details  Name: Meagan Cox MRN: [id number] Date of Birth: [birth year] Referring Provider: Derrel Nip MD  Encounter Date: 08/30/2015      PT End of Session - 08/30/15 1601    Visit Number 16   Number of Visits 12   Date for PT Re-Evaluation 09/08/15   Authorization Type 6/10    PT Start Time 1505   PT Stop Time 1540   PT Time Calculation (min) 35 min      Past Medical History:  Diagnosis Date  . Hyperlipidemia   . Hypertension   . Hypothyroidism     Past Surgical History:  Procedure Laterality Date  . ABDOMINAL HYSTERECTOMY    . CARDIAC CATHETERIZATION     30 years ago, [place]  . CHOLECYSTECTOMY N/A 08/21/2015   Procedure: LAPAROSCOPIC CHOLECYSTECTOMY;  Surgeon: Jules Husbands, MD;  Location: [facility] ORS;  Service: General;  Laterality: N/A;  . PARTIAL HYSTERECTOMY    . TONSILLECTOMY      There were no vitals filed for this visit.      Subjective Assessment - 08/30/15 1513    Subjective Pt reported she had gradual abdominal pain that wrapped around to the back like mm spasms last Sunday which led her to get hospitalized and underwent cholecystectomy on 08/21/15.  Pt has been feeling sore over the surgical sites and has been doing less exercises.  Pt no longer feels the abdominal pains.    Pertinent History R medial thigh pain: Pt has trouble sleeping  on L side due to L arm pain  and tries to sleep on her back and belly.  Pt has difficulty sleeping on R  side due to R hip bursitis. Hx of R knee issues (meniscus "slipping out out place" and chriporactor helps to realign). Resolved for the past 1-2 years.   Gynecological: 3 vaginal birth,  without perineal trauma.  SUI . Denied saddle signs.  Pt travels to [place] 2x a year. Pt will be leaving in August for her next trip.     Patient  Stated Goals standing long periods at the airport and traveling with less pain. "Quit hurting."   Currently in Pain? No/denies            [facility] PT Assessment - 08/30/15 1553      Observation/Other Assessments   Observations pt reported muscle pain with hip ER/ abd/ flexion for piriformis stretch. withheld this stretch.      Ambulation/Gait   Gait Comments deep core activation, less gait deviations                     [facility] Adult PT Treatment/Exercise - 08/30/15 1553      Therapeutic Activites    Therapeutic Activities --  see pt instructions                PT Education - 08/30/15 1552    Education provided Yes   Education Details stretches for post-walking when she goes on her out of the country trip, nutrition website Medical laboratory scientific officer)  for post-cholecystectomy surgery    Person(s) Educated Patient   Methods Explanation;Demonstration;Tactile cues;Verbal cues;Handout   Comprehension Returned demonstration;Verbalized understanding             PT Long Term Goals - 07/19/15 1101      PT LONG TERM GOAL #1  Title Pt will decrease her ODI score from 24% to < 20% in order to travel on her trips to [place] and walk long distances on uneven surfaces.   Time 12   Period Weeks   Status Partially Met     PT LONG TERM GOAL #2   Title Pt will decrease her PSQI score from 29% to < 24% in order to improve sleep quality and demonstrate ability to lay in a comfortable position.    Time 12   Period Weeks   Status Partially Met     PT LONG TERM GOAL #3   Title Pt will be complaint with water intake in order to promote bladder health and hydration for muscles for walking.    Time 12   Status Achieved     PT LONG TERM GOAL #4   Title Pt will demo no pelvic asymmetries and increased hip PROM IR bilaterally > 10 deg in order to improve gait mechanics for ambulation.    Time 12   Period Weeks   Status Achieved     PT LONG TERM GOAL #5   Title Pt will demo no  pain with sidestepping L with yellow resistive band at thigh across 5 steps in order to perform exercises and to return to walking longer distances and maintain proper alignment of hips and LE.   Time 12   Period Weeks   Status Partially Met     Additional Long Term Goals   Additional Long Term Goals Yes     PT LONG TERM GOAL #6   Title Pt will demo IND with HEP   Time 12   Period Weeks   Status Partially Met               Plan - 08/30/15 1556    Clinical Impression Statement Pt underwent cholecystectomy last week and progression of exercises were deferred today as pt reported still feeling sore from the incision sites.  Incision sites appeared intact and well healed.  Pt shows good carry over of improved gait mechanics but required cuing for proper sitting posture of the feet and sit to stand mechanics. Pt continues to report mm pain with seated hip flex/ ER/ abd on R. Pt demo'd IND with LE stretches but required cuing to not go past the point of pain.  Pt is progressing well towards her goals.    Rehab Potential Good   PT Frequency 2x / week   PT Duration 8 weeks   PT Treatment/Interventions ADLs/Self Care Home Management;Aquatic Therapy;Gait training;Traction;Moist Heat;Stair training;Functional mobility training;Therapeutic activities;Therapeutic exercise;Balance training;Neuromuscular re-education;Electrical Stimulation;Cryotherapy;Manual techniques;Patient/family education;Passive range of motion;Dry needling;Energy conservation   Consulted and Agree with Plan of Care Patient      Patient will benefit from skilled therapeutic intervention in order to improve the following deficits and impairments:  Abnormal gait, Pain, Improper body mechanics, Postural dysfunction, Other (comment), Decreased mobility, Decreased coordination, Decreased activity tolerance, Decreased endurance, Decreased balance, Decreased safety awareness, Difficulty walking, Impaired flexibility, Decreased range  of motion, Obesity, Decreased strength, Decreased scar mobility, Increased muscle spasms, Cardiopulmonary status limiting activity  Visit Diagnosis: Other abnormalities of gait and mobility  Poor posture  Myalgia     Problem List Patient Active Problem List   Diagnosis Date Noted  . Cholecystitis 08/21/2015  . Medicare annual wellness visit, subsequent 07/24/2015  . Cerebrovascular small vessel disease 04/24/2015  . Benign paroxysmal positional vertigo 04/24/2015  . Pain in joint, pelvic region and thigh 04/24/2015  . Counseling for  travel 01/11/2015  . Shoulder pain, right 11/22/2013  . Statin intolerance 10/08/2012  . Edema 05/14/2012  . Generalized anxiety disorder 01/06/2012  . 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 Hypothyroidism 12/18/2010  . Low back pain radiating to both legs 12/18/2010  . Hip pain, right 12/18/2010  . Obesity 05/09/2010  . CAD (coronary artery disease) 05/09/2010  . Hyperlipidemia 05/09/2010  . HTN (hypertension) 05/09/2010    Jerl Mina ,PT, DPT, E-RYT  08/30/2015, 4:01 PM  [place] MAIN [facility] SERVICES [address] [place], [place], [address] Phone: [phone]   Fax:  [phone]  Name: Meagan Cox MRN: [id number] Date of Birth: 01-30-1940

## 2015-09-05 ENCOUNTER — Ambulatory Visit: Payer: PPO | Admitting: Physical Therapy

## 2015-09-05 ENCOUNTER — Ambulatory Visit (INDEPENDENT_AMBULATORY_CARE_PROVIDER_SITE_OTHER): Payer: PPO | Admitting: Surgery

## 2015-09-05 DIAGNOSIS — R293 Abnormal posture: Secondary | ICD-10-CM

## 2015-09-05 DIAGNOSIS — M791 Myalgia, unspecified site: Secondary | ICD-10-CM

## 2015-09-05 DIAGNOSIS — R2689 Other abnormalities of gait and mobility: Secondary | ICD-10-CM

## 2015-09-05 DIAGNOSIS — Z09 Encounter for follow-up examination after completed treatment for conditions other than malignant neoplasm: Secondary | ICD-10-CM

## 2015-09-05 NOTE — Progress Notes (Signed)
S/p lap chole, path d/w pt  She is doing very well no complaints Had inc Bp , advice to go to PCP  PE NAD Abd: soft, NT, incisions c/d/i no infection  A/P doing very well No heavy lifting  RTC prn

## 2015-09-07 ENCOUNTER — Telehealth: Payer: Self-pay | Admitting: Internal Medicine

## 2015-09-07 NOTE — Telephone Encounter (Signed)
Left VM to return our call

## 2015-09-07 NOTE — Therapy (Signed)
[place] MAIN [facility] SERVICES [address] [place], [place], [address] Phone: [phone]   Fax:  [phone]  Patient Details  Name: Meagan Cox MRN: [id number] Date of Birth: 09/26/1940 Referring Provider:  Crecencio Mc, MD  Encounter Date: 09/05/2015  No Tx was performed today as pt reported she has not had any hip issues the past weeks as she continues to pay attention to the way she moves.  Pt reported she does not plan on performing her exercises on her vacation but will resort them if she needs.  Pt leaves for her out of country vacation trip in two days and will return for a follow-up PT session end of this month.   Jerl Mina ,PT, DPT, E-RYT  09/07/2015, 7:48 AM  [place] MAIN [facility] SERVICES [address] [place], [place], [address] Phone: [phone]   Fax:  [phone]

## 2015-09-07 NOTE — Telephone Encounter (Signed)
  The results of your  cologuard test were negative/normal.   We will repeat every 3 years for colon CA screening.   Regards,   Deborra Medina, MD

## 2015-09-08 NOTE — Telephone Encounter (Signed)
Left a VM on cell phone number provided in chart.

## 2015-09-09 NOTE — Telephone Encounter (Signed)
Mailed letter to patient with results, thanks

## 2015-09-20 ENCOUNTER — Inpatient Hospital Stay
Admission: RE | Admit: 2015-09-20 | Discharge: 2015-09-20 | Disposition: A | Discharge status: Home / Self Care | Payer: Self-pay | Source: Ambulatory Visit | Attending: *Deleted | Admitting: *Deleted

## 2015-09-20 ENCOUNTER — Other Ambulatory Visit: Payer: Self-pay | Admitting: *Deleted

## 2015-09-20 DIAGNOSIS — Z9289 Personal history of other medical treatment: Secondary | ICD-10-CM

## 2015-09-26 ENCOUNTER — Ambulatory Visit: Payer: PPO | Admitting: Physical Therapy

## 2015-09-26 DIAGNOSIS — M791 Myalgia, unspecified site: Secondary | ICD-10-CM

## 2015-09-26 DIAGNOSIS — R2689 Other abnormalities of gait and mobility: Secondary | ICD-10-CM

## 2015-09-26 DIAGNOSIS — R293 Abnormal posture: Secondary | ICD-10-CM

## 2015-09-26 DIAGNOSIS — M545 Low back pain: Secondary | ICD-10-CM | POA: Diagnosis not present

## 2015-09-26 DIAGNOSIS — M9903 Segmental and somatic dysfunction of lumbar region: Secondary | ICD-10-CM | POA: Diagnosis not present

## 2015-09-26 NOTE — Patient Instructions (Signed)
Gentle massage over pubic bone  Quad stretch  Continue with all that you have been doing  So proud of you and your hard work!

## 2015-09-26 NOTE — Therapy (Signed)
[place] MAIN [facility] SERVICES [address] [place], [place], [address] Phone: [phone]   Fax:  [phone]  Physical Therapy Treatment  Patient Details  Name: Meagan Cox MRN: [id number] Date of Birth: January 26, 1941 Referring Provider: Derrel Nip MD  Encounter Date: 09/26/2015      PT End of Session - 09/26/15 1011    Visit Number 16   Number of Visits    Date for PT Re-Evaluation 09/08/15   Authorization Type g-code entered today       Past Medical History:  Diagnosis Date  . Hyperlipidemia   . Hypertension   . Hypothyroidism     Past Surgical History:  Procedure Laterality Date  . ABDOMINAL HYSTERECTOMY    . CARDIAC CATHETERIZATION     30 years ago, [place]  . CHOLECYSTECTOMY N/A 08/21/2015   Procedure: LAPAROSCOPIC CHOLECYSTECTOMY;  Surgeon: Jules Husbands, MD;  Location: [facility] ORS;  Service: General;  Laterality: N/A;  . PARTIAL HYSTERECTOMY    . TONSILLECTOMY      There were no vitals filed for this visit.      Subjective Assessment - 09/26/15 0917    Subjective Pt returned from her [place] trip and did not have any incidents where her R leg gave out on her. She was careful with her movements throughout her trip and remembered PT's reminder of taking "baby steps".  Pt states she worked on unlocking her knees which helped tremendously.    She walked 3-4 mi daily , climbed stairs, and walked on cobblestone streets. Pt had one close fall but was able to catch herself. The only issue pt feels is "soreness" and "pulling" in her R hip. The original pain is gone but the muscles feel sore. Pt reported her upcoming weeks will be busy as she tearfully expressed her mother is dying and currently at the hospital.       Pertinent History R medial thigh pain: Pt has trouble sleeping  on L side due to L arm pain  and tries to sleep on her back and belly.  Pt has difficulty sleeping on R  side due to R hip bursitis. Hx of R knee issues  (meniscus "slipping out out place" and chriporactor helps to realign). Resolved for the past 1-2 years.   Gynecological: 3 vaginal birth,  without perineal trauma.  SUI . Denied saddle signs.  Pt travels to [place] 2x a year. Pt will be leaving in August for her next trip.     Patient Stated Goals standing long periods at the airport and traveling with less pain. "Quit hurting."            [facility] PT Assessment - 09/26/15 1033      AROM   Overall AROM Comments No pain with sidelying R hip abd MMT 4-/5     Palpation   Palpation comment minor fascial restrictions along L pubic tubercle and symphysis with referred pulling sensation on R inguinal area by AIIS                      [facility] Adult PT Treatment/Exercise - 09/26/15 1035      Therapeutic Activites    Therapeutic Activities --  reassessed goals      Manual Therapy   Manual therapy comments manual lymph driange along abdomen, inguinal, legs --> fascial releases over LQ scar and pubic symphysis/ L pubic tubercle  PT Long Term Goals - 10-09-2015 1007      PT LONG TERM GOAL #1   Title Pt will decrease her ODI score from 24% to < 20% in order to travel on her trips to [place] and walk long distances on uneven surfaces.  (10/09/2015: 12%)    Time 12   Period Weeks   Status Achieved     PT LONG TERM GOAL #2   Title Pt will decrease her PSQI score from 29% to < 24% in order to improve sleep quality and demonstrate ability to lay in a comfortable position.  (1%)   Time 12   Period Weeks   Status Achieved     PT LONG TERM GOAL #3   Title Pt will be complaint with water intake in order to promote bladder health and hydration for muscles for walking.    Time 12   Status Achieved     PT LONG TERM GOAL #4   Title Pt will demo no pelvic asymmetries and increased hip PROM IR bilaterally > 10 deg in order to improve gait mechanics for ambulation.    Time 12   Period Weeks   Status Achieved      PT LONG TERM GOAL #5   Title Pt will demo no pain with sidestepping L with yellow resistive band at thigh across 5 steps in order to perform exercises and to return to walking longer distances and maintain proper alignment of hips and LE.   Time 12   Period Weeks   Status Achieved     PT LONG TERM GOAL #6   Title Pt will demo IND with HEP   Time 12   Period Weeks   Status Partially Met               Plan - 2015/10/09 9390    Clinical Impression Statement Pt went on her [place] trip without any R leg pain despite walking 3-4 mi daily, climbing stiars, and walking uneven ground. The only issue pt feels is "soreness" and "pulling" in her R hip which improved by 75% following manual Tx.  Pt showed improved RLE hip strength and full ROM without pain and also has significantly decreased her ODI score from 24% to 12%.    Pt has achieved 5/6 goals and is progressing towards her remaining goal. Pt will be ready for d/c at next visit which is scheduled in a month.     Rehab Potential Good   PT Frequency 2x / week   PT Duration 8 weeks   PT Treatment/Interventions ADLs/Self Care Home Management;Aquatic Therapy;Gait training;Traction;Moist Heat;Stair training;Functional mobility training;Therapeutic activities;Therapeutic exercise;Balance training;Neuromuscular re-education;Electrical Stimulation;Cryotherapy;Manual techniques;Patient/family education;Passive range of motion;Dry needling;Energy conservation   Consulted and Agree with Plan of Care Patient      Patient will benefit from skilled therapeutic intervention in order to improve the following deficits and impairments:  Abnormal gait, Pain, Improper body mechanics, Postural dysfunction, Other (comment), Decreased mobility, Decreased coordination, Decreased activity tolerance, Decreased endurance, Decreased balance, Decreased safety awareness, Difficulty walking, Impaired flexibility, Decreased range of motion, Obesity, Decreased strength,  Decreased scar mobility, Increased muscle spasms, Cardiopulmonary status limiting activity  Visit Diagnosis: Other abnormalities of gait and mobility  Poor posture  Myalgia       G-Codes - 10-09-2015 1012    Functional Assessment Tool Used ODI  12%    , PSQI   1%   Functional Limitation Self care;Mobility: Walking and moving around   Mobility: Walking and Moving Around Current  Status [phone]) At least 1 percent but less than 20 percent impaired, limited or restricted   Mobility: Walking and Moving Around Goal Status [id number]) At least 1 percent but less than 20 percent impaired, limited or restricted   Self Care Current Status (N0148) At least 1 percent but less than 20 percent impaired, limited or restricted   Self Care Goal Status (W0397) At least 1 percent but less than 20 percent impaired, limited or restricted      Problem List Patient Active Problem List   Diagnosis Date Noted  . Cholecystitis 08/21/2015  . Medicare annual wellness visit, subsequent 07/24/2015  . Cerebrovascular small vessel disease 04/24/2015  . Benign paroxysmal positional vertigo 04/24/2015  . Pain in joint, pelvic region and thigh 04/24/2015  . Counseling for travel 01/11/2015  . Shoulder pain, right 11/22/2013  . Statin intolerance 10/08/2012  . Edema 05/14/2012  . Generalized anxiety disorder 01/06/2012  . 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 Hypothyroidism 12/18/2010  . Low back pain radiating to both legs 12/18/2010  . Hip pain, right 12/18/2010  . Obesity 05/09/2010  . CAD (coronary artery disease) 05/09/2010  . Hyperlipidemia 05/09/2010  . HTN (hypertension) 05/09/2010    Jerl Mina ,PT, DPT, E-RYT  09/26/2015, 10:40 AM  [place] MAIN [facility] SERVICES [address] [place], [place], [address] Phone: [phone]   Fax:  [phone]  Name: Mari Battaglia MRN: [id number] Date of Birth: 05/18/1940

## 2015-09-27 ENCOUNTER — Ambulatory Visit: Admission: RE | Admit: 2015-09-27 | Payer: PPO | Source: Ambulatory Visit

## 2015-09-27 ENCOUNTER — Inpatient Hospital Stay: Admission: RE | Admit: 2015-09-27 | Payer: PPO | Source: Ambulatory Visit

## 2015-09-28 ENCOUNTER — Encounter: Payer: PPO | Admitting: Physical Therapy

## 2015-10-19 ENCOUNTER — Ambulatory Visit: Payer: PPO | Attending: Internal Medicine | Admitting: Physical Therapy

## 2015-10-19 DIAGNOSIS — R293 Abnormal posture: Secondary | ICD-10-CM | POA: Diagnosis not present

## 2015-10-19 DIAGNOSIS — M791 Myalgia, unspecified site: Secondary | ICD-10-CM

## 2015-10-19 DIAGNOSIS — R2689 Other abnormalities of gait and mobility: Secondary | ICD-10-CM | POA: Diagnosis not present

## 2015-10-19 NOTE — Patient Instructions (Signed)
Seated cat / cow  3 breaths    Seated twist   3 breaths     Spinal lengthening at counter  5 breaths    Standing arm swings with pelvis and knees not moving (ribcage rotation only) 5 x  Standing sidebend 5 x each side    Practice out of chair with feet/ knees wider

## 2015-10-20 NOTE — Therapy (Signed)
[place] MAIN [facility] SERVICES [address] [place], [place], [address] Phone: [phone]   Fax:  [phone]  Physical Therapy Treatment  Patient Details  Name: Meagan Cox MRN: [id number] Date of Birth: 08/13/1940 Referring Provider: Derrel Nip MD  Encounter Date: 10/19/2015      PT End of Session - 10/19/15 0923    Visit Number 17   Date for PT Re-Evaluation 01/17/16   Authorization Type 7/10   PT Start Time 0910   PT Stop Time 1010   PT Time Calculation (min) 60 min   Activity Tolerance Patient tolerated treatment well;No increased pain   Behavior During Therapy WFL for tasks assessed/performed      Past Medical History:  Diagnosis Date  . Hyperlipidemia   . Hypertension   . Hypothyroidism     Past Surgical History:  Procedure Laterality Date  . ABDOMINAL HYSTERECTOMY    . CARDIAC CATHETERIZATION     30 years ago, [place]  . CHOLECYSTECTOMY N/A 08/21/2015   Procedure: LAPAROSCOPIC CHOLECYSTECTOMY;  Surgeon: Jules Husbands, MD;  Location: [facility] ORS;  Service: General;  Laterality: N/A;  . PARTIAL HYSTERECTOMY    . TONSILLECTOMY      There were no vitals filed for this visit.      Subjective Assessment - 10/19/15 0914    Subjective Pt reported she has been walking 1 mile a day for the past 3 weeks. Pt has tried to perform HEP but she has not been able to do them since she has felt a pain 7/10 at the R groin with walking, sit to stand, standing in the bed), and rolling her toe out) since she got back from her [place] trip that involved walking 3 miles a day. This pain had existed prior to the trip but not to this extent.  This pain does not catch like her initial pain that occurred at the medial thigh.  She no longer feels the pain at the medial thigh.  Pt also has felt a R buttock pain 1.5 weeeks ago ago that hurts with sitting on it and getting out of the chair        Pertinent History R medial thigh pain: Pt has trouble  sleeping  on L side due to L arm pain  and tries to sleep on her back and belly.  Pt has difficulty sleeping on R  side due to R hip bursitis. Hx of R knee issues (meniscus "slipping out out place" and chriporactor helps to realign). Resolved for the past 1-2 years.   Gynecological: 3 vaginal birth,  without perineal trauma.  SUI . Denied saddle signs.  Pt travels to [place] 2x a year. Pt will be leaving in August for her next trip.     Patient Stated Goals standing long periods at the airport and traveling with less pain. "Quit hurting."            [facility] PT Assessment - 10/20/15 1310      Sit to Stand   Comments difficulty with rise, genu valgus.  R hip ER at 0 deg no pain, R hip ER at ~5-10 deg with p!)      Palpation   Palpation comment midback paraspinal tensions to sacrotuberous/piriformis  attachments on R buttock                      [facility] Adult PT Treatment/Exercise - 10/20/15 1312      Neuro Re-ed  Neuro Re-ed Details  proper sit to rise alignment      Manual Therapy   Manual therapy comments STM and rocking towards at ribcage to release paraspinals to sacrotuberous and pirformis R                 PT Education - 10/20/15 1313    Education provided Yes   Education Details HEP   Person(s) Educated Patient   Methods Explanation;Demonstration;Tactile cues;Verbal cues;Handout   Comprehension Returned demonstration;Verbalized understanding             PT Long Term Goals - 10/20/15 1314      PT LONG TERM GOAL #1   Title Pt will decrease her ODI score from 24% to < 20% in order to travel on her trips to [place] and walk long distances on uneven surfaces.  (09/26/15: 12%)    Time 12   Period Weeks   Status Achieved     PT LONG TERM GOAL #2   Title Pt will decrease her PSQI score from 29% to < 24% in order to improve sleep quality and demonstrate ability to lay in a comfortable position.  (1%)   Time 12   Period Weeks   Status Achieved     PT  LONG TERM GOAL #3   Title Pt will be complaint with water intake in order to promote bladder health and hydration for muscles for walking.    Time 12   Status Achieved     PT LONG TERM GOAL #4   Title Pt will demo no pelvic asymmetries and increased hip PROM IR bilaterally > 10 deg in order to improve gait mechanics for ambulation.    Time 12   Period Weeks   Status Achieved     PT LONG TERM GOAL #5   Title Pt will demo no pain with sidestepping L with yellow resistive band at thigh across 5 steps in order to perform exercises and to return to walking longer distances and maintain proper alignment of hips and LE.   Time 12   Period Weeks   Status Achieved     PT LONG TERM GOAL #6   Title Pt will demo IND with HEP   Time 12   Period Weeks   Status Partially Met     PT LONG TERM GOAL #7   Title Pt will demo decreased thoracic/ lumbar/ gluteal mm tensions B and report no reoccurence of pain at R glut and medial to ASIS across 4 weeks in order to demo IND with self-maintenence to m inimize relapse of pain.    Time 12   Period Weeks   Status New            Plan - 10/20/15 1313    Clinical Impression Statement Pt continues to show improvements with her R thigh/ groin pain and now has pain in a localized region medial to R ASIS and posterior buttock. Pt showed increased paraspinal mm (thoracic region more dominant) tensions to sacrotuberous/ piriformis level which is likely the cause to her remaining pain complaints.After treatment today, pt was able to perform sit to stand without pain and also reported less tenderness to palpation over posterior glut.  Pt will  continue to required skilled PT address these remaining areas of deficits to help pt minimize relapse of her Sx.    Rehab Potential Good   PT Frequency 1x / week   PT Duration 12 weeks   PT Treatment/Interventions ADLs/Self Care Home Management;Aquatic Therapy;Gait  training;Traction;Moist Heat;Stair training;Functional  mobility training;Therapeutic activities;Therapeutic exercise;Balance training;Neuromuscular re-education;Electrical Stimulation;Cryotherapy;Manual techniques;Patient/family education;Passive range of motion;Dry needling;Energy conservation   Consulted and Agree with Plan of Care Patient      Patient will benefit from skilled therapeutic intervention in order to improve the following deficits and impairments:  Abnormal gait, Pain, Improper body mechanics, Postural dysfunction, Other (comment), Decreased mobility, Decreased coordination, Decreased activity tolerance, Decreased endurance, Decreased balance, Decreased safety awareness, Difficulty walking, Impaired flexibility, Decreased range of motion, Obesity, Decreased strength, Decreased scar mobility, Increased muscle spasms, Cardiopulmonary status limiting activity  Visit Diagnosis: Poor posture  Myalgia  Other abnormalities of gait and mobility     Problem List Patient Active Problem List   Diagnosis Date Noted  . Cholecystitis 08/21/2015  . Medicare annual wellness visit, subsequent 07/24/2015  . Cerebrovascular small vessel disease 04/24/2015  . Benign paroxysmal positional vertigo 04/24/2015  . Pain in joint, pelvic region and thigh 04/24/2015  . Counseling for travel 01/11/2015  . Shoulder pain, right 11/22/2013  . Statin intolerance 10/08/2012  . Edema 05/14/2012  . Generalized anxiety disorder 01/06/2012  . 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 Hypothyroidism 12/18/2010  . Low back pain radiating to both legs 12/18/2010  . Hip pain, right 12/18/2010  . Obesity 05/09/2010  . CAD (coronary artery disease) 05/09/2010  . Hyperlipidemia 05/09/2010  . HTN (hypertension) 05/09/2010    Jerl Mina ,PT, DPT, E-RYT  10/20/2015, 1:15 PM  [place] MAIN [facility] SERVICES [address] [place], [place], [address] Phone: [phone]   Fax:  [phone]  Name: Meagan Cox MRN: [id number] Date of Birth: 1940-05-20

## 2015-10-27 ENCOUNTER — Ambulatory Visit: Payer: PPO | Admitting: Physical Therapy

## 2015-10-27 DIAGNOSIS — R2689 Other abnormalities of gait and mobility: Secondary | ICD-10-CM

## 2015-10-27 DIAGNOSIS — R293 Abnormal posture: Secondary | ICD-10-CM

## 2015-10-27 DIAGNOSIS — M791 Myalgia, unspecified site: Secondary | ICD-10-CM

## 2015-10-28 NOTE — Therapy (Signed)
[place] MAIN [facility] SERVICES [address] [place], [place], [address] Phone: [phone]   Fax:  [phone]  Physical Therapy Treatment  Patient Details  Name: Meagan Cox MRN: [id number] Date of Birth: 12/13/1940 Referring Provider: Derrel Nip MD  Encounter Date: 10/27/2015      PT End of Session - 10/28/15 2231    Visit Number 18   Date for PT Re-Evaluation 01/17/16   Authorization Type 8/10   PT Start Time 2094   PT Stop Time 7096   PT Time Calculation (min) 70 min   Activity Tolerance Patient tolerated treatment well;No increased pain   Behavior During Therapy WFL for tasks assessed/performed      Past Medical History:  Diagnosis Date  . Hyperlipidemia   . Hypertension   . Hypothyroidism     Past Surgical History:  Procedure Laterality Date  . ABDOMINAL HYSTERECTOMY    . CARDIAC CATHETERIZATION     30 years ago, [place]  . CHOLECYSTECTOMY N/A 08/21/2015   Procedure: LAPAROSCOPIC CHOLECYSTECTOMY;  Surgeon: Jules Husbands, MD;  Location: [facility] ORS;  Service: General;  Laterality: N/A;  . PARTIAL HYSTERECTOMY    . TONSILLECTOMY      There were no vitals filed for this visit.      Subjective Assessment - 10/27/15 1537    Subjective (P)  Pt reported 100% relief in her R  buttock after last session. The pain medial to R ASIS remain the same. After taking a long road trip (total of 8 hr and 5 hr), pt's posterior R buttock pain returned  and R hip pain increased and it radiates down to the anteromedial thigh. This pain is different than the pain pt came into PT with. Her original pain on her R thigh has resolved for atleast 1 month.     Pertinent History (P)  R medial thigh pain: Pt has trouble sleeping  on L side due to L arm pain  and tries to sleep on her back and belly.  Pt has difficulty sleeping on R  side due to R hip bursitis. Hx of R knee issues (meniscus "slipping out out place" and chriporactor helps to realign).  Resolved for the past 1-2 years.   Gynecological: 3 vaginal birth,  without perineal trauma.  SUI . Denied saddle signs.  Pt travels to [place] 2x a year. Pt will be leaving in August for her next trip.     Patient Stated Goals (P)  standing long periods at the airport and traveling with less pain. "Quit hurting."            [facility] PT Assessment - 10/28/15 2228      Palpation   Palpation comment fascial restrictions over R inguinal area, palpation with tenderness and tensions at T10-12 R interspinal mm with referred pain to R anteromedial thigh,  hip ER in supine limited to ~10 deg, pre Tx, 15 deg post Tx  tensions at coccygeus/ obt int R                      [facility] Adult PT Treatment/Exercise - 10/28/15 2227      Therapeutic Activites    Therapeutic Activities --  see pt instructions     Manual Therapy   Manual therapy comments STM at T10-12 R of vertebral body, coccygeus/ obt int R   R LE long axis distraction, fascial release at inguinal area  PT Long Term Goals - 10/28/15 2233      PT LONG TERM GOAL #1   Title Pt will decrease her ODI score from 24% to < 20% in order to travel on her trips to [place] and walk long distances on uneven surfaces.  (09/26/15: 12%)    Time 12   Period Weeks   Status Achieved     PT LONG TERM GOAL #2   Title Pt will decrease her PSQI score from 29% to < 24% in order to improve sleep quality and demonstrate ability to lay in a comfortable position.  (1%)   Time 12   Period Weeks   Status Achieved     PT LONG TERM GOAL #3   Title Pt will be complaint with water intake in order to promote bladder health and hydration for muscles for walking.    Time 12   Status Achieved     PT LONG TERM GOAL #4   Title Pt will demo no pelvic asymmetries and increased hip PROM IR bilaterally > 10 deg in order to improve gait mechanics for ambulation.    Time 12   Period Weeks   Status Achieved     PT LONG TERM GOAL  #5   Title Pt will demo no pain with sidestepping L with yellow resistive band at thigh across 5 steps in order to perform exercises and to return to walking longer distances and maintain proper alignment of hips and LE.   Time 12   Period Weeks   Status Achieved     Additional Long Term Goals   Additional Long Term Goals Yes     PT LONG TERM GOAL #6   Title Pt will demo IND with HEP   Time 12   Period Weeks   Status Partially Met     PT LONG TERM GOAL #7   Title Pt will demo decreased thoracic/ lumbar/ gluteal mm tensions B and report no reoccurence of pain at R glut and medial to ASIS/inguinal area across 4 weeks in order to demo IND with self-maintenence to m inimize relapse of pain.    Time 12   Period Weeks   Status New               Plan - 10/28/15 2232    Clinical Impression Statement Pt   Rehab Potential Good   PT Frequency 1x / week   PT Duration 12 weeks   PT Treatment/Interventions ADLs/Self Care Home Management;Aquatic Therapy;Gait training;Traction;Moist Heat;Stair training;Functional mobility training;Therapeutic activities;Therapeutic exercise;Balance training;Neuromuscular re-education;Electrical Stimulation;Cryotherapy;Manual techniques;Patient/family education;Passive range of motion;Dry needling;Energy conservation   Consulted and Agree with Plan of Care Patient      Patient will benefit from skilled therapeutic intervention in order to improve the following deficits and impairments:  Abnormal gait, Pain, Improper body mechanics, Postural dysfunction, Other (comment), Decreased mobility, Decreased coordination, Decreased activity tolerance, Decreased endurance, Decreased balance, Decreased safety awareness, Difficulty walking, Impaired flexibility, Decreased range of motion, Obesity, Decreased strength, Decreased scar mobility, Increased muscle spasms, Cardiopulmonary status limiting activity  Visit Diagnosis: Poor posture  Myalgia  Other  abnormalities of gait and mobility     Problem List Patient Active Problem List   Diagnosis Date Noted  . Cholecystitis 08/21/2015  . Medicare annual wellness visit, subsequent 07/24/2015  . Cerebrovascular small vessel disease 04/24/2015  . Benign paroxysmal positional vertigo 04/24/2015  . Pain in joint, pelvic region and thigh 04/24/2015  . Counseling for travel 01/11/2015  . Shoulder pain, right 11/22/2013  .  Statin intolerance 10/08/2012  . Edema 05/14/2012  . Generalized anxiety disorder 01/06/2012  . 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 Hypothyroidism 12/18/2010  . Low back pain radiating to both legs 12/18/2010  . Hip pain, right 12/18/2010  . Obesity 05/09/2010  . CAD (coronary artery disease) 05/09/2010  . Hyperlipidemia 05/09/2010  . HTN (hypertension) 05/09/2010    Jerl Mina ,PT, DPT, E-RYT  10/28/2015, 10:34 PM  [place] MAIN [facility] SERVICES [address] [place], [place], [address] Phone: [phone]   Fax:  [phone]  Name: Chaya Dehaan MRN: [id number] Date of Birth: Jan 08, 1941

## 2015-10-28 NOTE — Patient Instructions (Signed)
Open book  (handout)   Mini squats

## 2015-11-03 ENCOUNTER — Ambulatory Visit: Payer: PPO | Attending: Internal Medicine | Admitting: Physical Therapy

## 2015-11-03 DIAGNOSIS — R2689 Other abnormalities of gait and mobility: Secondary | ICD-10-CM | POA: Diagnosis not present

## 2015-11-03 DIAGNOSIS — M791 Myalgia, unspecified site: Secondary | ICD-10-CM

## 2015-11-03 DIAGNOSIS — R293 Abnormal posture: Secondary | ICD-10-CM | POA: Insufficient documentation

## 2015-11-03 NOTE — Patient Instructions (Signed)
Open book while laying on R side   Seated twist to the L  3 breaths, inhale , length spine, exhale twist    Standing, band under L foot, R hand on the counter.  R foot stepped back hip width   In hale tall, exhale pull band from R thigh to across body, without turning knees and pelvis  10 x to L     _______  Gentle  light semi circles towards head  Over abdomen , four corners  Then R thigh   5 stroke each each

## 2015-11-04 ENCOUNTER — Ambulatory Visit: Payer: PPO | Admitting: Physical Therapy

## 2015-11-04 DIAGNOSIS — R2689 Other abnormalities of gait and mobility: Secondary | ICD-10-CM

## 2015-11-04 DIAGNOSIS — R293 Abnormal posture: Secondary | ICD-10-CM

## 2015-11-04 DIAGNOSIS — M791 Myalgia, unspecified site: Secondary | ICD-10-CM

## 2015-11-04 NOTE — Therapy (Signed)
[place] MAIN [facility] SERVICES [address] [place], [place], [address] Phone: [phone]   Fax:  [phone]  Physical Therapy Treatment  Patient Details  Name: Meagan Cox MRN: [id number] Date of Birth: [birth year] Referring Provider: Derrel Nip MD  Encounter Date: 11/03/2015      PT End of Session - 11/04/15 1731    Visit Number 19   Date for PT Re-Evaluation 01/17/16   Authorization Type 9/10   PT Start Time 1330   PT Stop Time 1430   PT Time Calculation (min) 60 min      Past Medical History:  Diagnosis Date  . Hyperlipidemia   . Hypertension   . Hypothyroidism     Past Surgical History:  Procedure Laterality Date  . ABDOMINAL HYSTERECTOMY    . CARDIAC CATHETERIZATION     30 years ago, [place]  . CHOLECYSTECTOMY N/A 08/21/2015   Procedure: LAPAROSCOPIC CHOLECYSTECTOMY;  Surgeon: Jules Husbands, MD;  Location: [facility] ORS;  Service: General;  Laterality: N/A;  . PARTIAL HYSTERECTOMY    . TONSILLECTOMY      There were no vitals filed for this visit.      Subjective Assessment - 11/03/15 1334    Subjective Pt reported feeling real sore the next 3 days following last session and then the soreness became pain 9/10/ Pain is located behind the sacrum and it wraps both hips to the anterior thigh to the level above the knee. The R groin pain remains the same.  The R gluteal pain from last session  has shifted to the left and is on both side when she sits . It is not there all the time but is like it is spasming. Denied bowel and bladder issues.       Pertinent History R medial thigh pain: Pt has trouble sleeping  on L side due to L arm pain  and tries to sleep on her back and belly.  Pt has difficulty sleeping on R  side due to R hip bursitis. Hx of R knee issues (meniscus "slipping out out place" and chriporactor helps to realign). Resolved for the past 1-2 years.   Gynecological: 3 vaginal birth,  without perineal trauma.  SUI .  Denied saddle signs.  Pt travels to [place] 2x a year. Pt will be leaving in August for her next trip.     Patient Stated Goals standing long periods at the airport and traveling with less pain. "Quit hurting."            [facility] PT Assessment - 11/04/15 1720      Palpation   SI assessment  Sacrum hypomobility (nutation),  fascial restriction(flinching tenderness) genitofemoral triangle    Palpation comment significant tensions at L aspect of T8-12, SP at T8, T10 with tenderness.   decreased post Tx     Ambulation/Gait   Gait Comments limited trunk rotation, hip ext on RLE.  Pt stated on treadmill, she keeps BUE  on rail                     [facility] Adult PT Treatment/Exercise - 11/04/15 1720      Therapeutic Activites    Therapeutic Activities --  see pt instructions     Manual Therapy   Manual therapy comments STM, grade II, III along midback areas, superior/inferior glides of sacrum, fascial releases over restricted areas over R medial thigh  PT Education - 11/04/15 1731    Education provided Yes   Education Details hep   Person(s) Educated Patient   Methods Explanation;Demonstration;Tactile cues;Verbal cues;Handout   Comprehension Verbalized understanding;Returned demonstration             PT Long Term Goals - 10/28/15 2233      PT LONG TERM GOAL #1   Title Pt will decrease her ODI score from 24% to < 20% in order to travel on her trips to [place] and walk long distances on uneven surfaces.  (09/26/15: 12%)    Time 12   Period Weeks   Status Achieved     PT LONG TERM GOAL #2   Title Pt will decrease her PSQI score from 29% to < 24% in order to improve sleep quality and demonstrate ability to lay in a comfortable position.  (1%)   Time 12   Period Weeks   Status Achieved     PT LONG TERM GOAL #3   Title Pt will be complaint with water intake in order to promote bladder health and hydration for muscles for walking.    Time 12    Status Achieved     PT LONG TERM GOAL #4   Title Pt will demo no pelvic asymmetries and increased hip PROM IR bilaterally > 10 deg in order to improve gait mechanics for ambulation.    Time 12   Period Weeks   Status Achieved     PT LONG TERM GOAL #5   Title Pt will demo no pain with sidestepping L with yellow resistive band at thigh across 5 steps in order to perform exercises and to return to walking longer distances and maintain proper alignment of hips and LE.   Time 12   Period Weeks   Status Achieved     Additional Long Term Goals   Additional Long Term Goals Yes     PT LONG TERM GOAL #6   Title Pt will demo IND with HEP   Time 12   Period Weeks   Status Partially Met     PT LONG TERM GOAL #7   Title Pt will demo decreased thoracic/ lumbar/ gluteal mm tensions B and report no reoccurence of pain at R glut and medial to ASIS/inguinal area across 4 weeks in order to demo IND with self-maintenence to m inimize relapse of pain.    Time 12   Period Weeks   Status New               Plan - 11/04/15 1732    Clinical Impression Statement Pt demo'd significantly decreased midback tensions/ hypomobility with increased sacroiliac movements. Pt was cued for trunk rotation and increased hip ext in gait training.  Plan to focus on fascial restrictions over R medial thigh at next visit. Pt will continue to benefit from skilled PT.    Rehab Potential Good   PT Frequency 1x / week   PT Duration 12 weeks   PT Treatment/Interventions ADLs/Self Care Home Management;Aquatic Therapy;Gait training;Traction;Moist Heat;Stair training;Functional mobility training;Therapeutic activities;Therapeutic exercise;Balance training;Neuromuscular re-education;Electrical Stimulation;Cryotherapy;Manual techniques;Patient/family education;Passive range of motion;Dry needling;Energy conservation   Consulted and Agree with Plan of Care Patient      Patient will benefit from skilled therapeutic  intervention in order to improve the following deficits and impairments:  Abnormal gait, Pain, Improper body mechanics, Postural dysfunction, Other (comment), Decreased mobility, Decreased coordination, Decreased activity tolerance, Decreased endurance, Decreased balance, Decreased safety awareness, Difficulty walking, Impaired flexibility, Decreased range of  motion, Obesity, Decreased strength, Decreased scar mobility, Increased muscle spasms, Cardiopulmonary status limiting activity  Visit Diagnosis: Poor posture  Myalgia  Other abnormalities of gait and mobility     Problem List Patient Active Problem List   Diagnosis Date Noted  . Cholecystitis 08/21/2015  . Medicare annual wellness visit, subsequent 07/24/2015  . Cerebrovascular small vessel disease 04/24/2015  . Benign paroxysmal positional vertigo 04/24/2015  . Pain in joint, pelvic region and thigh 04/24/2015  . Counseling for travel 01/11/2015  . Shoulder pain, right 11/22/2013  . Statin intolerance 10/08/2012  . Edema 05/14/2012  . Generalized anxiety disorder 01/06/2012  . 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 Hypothyroidism 12/18/2010  . Low back pain radiating to both legs 12/18/2010  . Hip pain, right 12/18/2010  . Obesity 05/09/2010  . CAD (coronary artery disease) 05/09/2010  . Hyperlipidemia 05/09/2010  . HTN (hypertension) 05/09/2010    Jerl Mina ,PT, DPT, E-RYT  11/04/2015, 5:36 PM  [place] MAIN [facility] SERVICES [address] [place], [place], [address] Phone: [phone]   Fax:  [phone]  Name: Meagan Cox MRN: [id number] Date of Birth: May 16, 1940

## 2015-11-04 NOTE — Therapy (Signed)
[place] MAIN [facility] SERVICES [address] [place], [place], [address] Phone: [phone]   Fax:  [phone]  Physical Therapy Treatment  Patient Details  Name: Meagan Cox MRN: [id number] Date of Birth: 08-20-1940 Referring Provider: Derrel Nip MD  Encounter Date: 11/04/2015      PT End of Session - 11/04/15 2135    Visit Number 20   Date for PT Re-Evaluation 01/17/16   Authorization Type 10/10   PT Start Time 0900   PT Stop Time 1000   PT Time Calculation (min) 60 min   Activity Tolerance Patient tolerated treatment well;No increased pain   Behavior During Therapy WFL for tasks assessed/performed      Past Medical History:  Diagnosis Date  . Hyperlipidemia   . Hypertension   . Hypothyroidism     Past Surgical History:  Procedure Laterality Date  . ABDOMINAL HYSTERECTOMY    . CARDIAC CATHETERIZATION     30 years ago, [place]  . CHOLECYSTECTOMY N/A 08/21/2015   Procedure: LAPAROSCOPIC CHOLECYSTECTOMY;  Surgeon: Jules Husbands, MD;  Location: [facility] ORS;  Service: General;  Laterality: N/A;  . PARTIAL HYSTERECTOMY    . TONSILLECTOMY      There were no vitals filed for this visit.      Subjective Assessment - 11/04/15 2127    Subjective Pt reported she does not feel her sacral/gluteal pain anymore but the pulling/ stretching pain at medial thigh has not changed.   Pertinent History R medial thigh pain: Pt has trouble sleeping  on L side due to L arm pain  and tries to sleep on her back and belly.  Pt has difficulty sleeping on R  side due to R hip bursitis. Hx of R knee issues (meniscus "slipping out out place" and chriporactor helps to realign). Resolved for the past 1-2 years.   Gynecological: 3 vaginal birth,  without perineal trauma.  SUI . Denied saddle signs.  Pt travels to [place] 2x a year. Pt will be leaving in August for her next trip.     Patient Stated Goals standing long periods at the airport and traveling with  less pain. "Quit hurting."            [facility] PT Assessment - 11/04/15 2130      PROM   Overall PROM Comments pre Tx: flinching pain R hip IR/ knee ext, post Tx: less flinching pain      Palpation   SI assessment  decreased thoracic hypomobility/ increased mm tensions.  remaining fascial tensions located in the genitofemoral triangle R     Palpation comment flinching pain by medial/lateral  thigh                     [facility] Adult PT Treatment/Exercise - 11/04/15 2128      Therapeutic Activites    Therapeutic Activities --  discussed ways to minimize relapse of pain     Manual Therapy   Manual therapy comments manual lymph drainage over abdomen. genitofemoral triangle                 PT Education - 11/04/15 1731    Education provided Yes   Education Details hep   Person(s) Educated Patient   Methods Explanation;Demonstration;Tactile cues;Verbal cues;Handout   Comprehension Verbalized understanding;Returned demonstration             PT Long Term Goals - 11/04/15 2136      PT LONG TERM GOAL #1  Title Pt will decrease her ODI score from 24% to < 20% in order to travel on her trips to [place] and walk long distances on uneven surfaces.  (09/26/15: 12%)    Time 12   Period Weeks   Status Achieved     PT LONG TERM GOAL #2   Title Pt will decrease her PSQI score from 29% to < 24% in order to improve sleep quality and demonstrate ability to lay in a comfortable position.  (1%)   Time 12   Period Weeks   Status Achieved     PT LONG TERM GOAL #3   Title Pt will be complaint with water intake in order to promote bladder health and hydration for muscles for walking.    Time 12   Status Achieved     PT LONG TERM GOAL #4   Title Pt will demo no pelvic asymmetries and increased hip PROM IR bilaterally > 10 deg in order to improve gait mechanics for ambulation.    Time 12   Period Weeks   Status Achieved     PT LONG TERM GOAL #5   Title Pt will demo  no pain with sidestepping L with yellow resistive band at thigh across 5 steps in order to perform exercises and to return to walking longer distances and maintain proper alignment of hips and LE.   Time 12   Period Weeks   Status Achieved     PT LONG TERM GOAL #6   Title Pt will demo IND with HEP   Time 12   Period Weeks   Status Partially Met     PT LONG TERM GOAL #7   Title Pt will demo decreased thoracic/ lumbar/ gluteal mm tensions B and report no reoccurence of pain at R glut and medial to ASIS/inguinal area across 4 weeks in order to demo IND with self-maintenence to m inimize relapse of pain.    Time 12   Period Weeks   Status Partially Met               Plan - 11/04/15 2139    Clinical Impression Statement Pt has achieved 5/7 goals and is progressing well towards her remaining goals. Pt's original area of pain has resolved and she was able to walk extensively on an foreign tourist trip for 10-12 days. Pt's remaining issue was located at the medial to ASIS (tightness).  Upon returning from her trip and a recent episode of long car ride, pt experienced pain in the gluts/ SIJ and the medial to the R ASIS area.  Across the past two visits with manual Tx, pt's glut/sacral pain has resolved but the area medial to R ASIS continues to show fascial restrictions and causes painful PROM hip IR and weakness in hip flexion.Suspect fascial restrictions are due to abdominal scar radhesions. Discussed w/ pt ways to minimize relapse of Sx with more breaks from sitting during road trips and continued compliance of HEP. PT voiced understanding.   Pt will continue to benefit from skilled PT.       Rehab Potential Good   PT Frequency 1x / week   PT Duration 12 weeks   PT Treatment/Interventions ADLs/Self Care Home Management;Aquatic Therapy;Gait training;Traction;Moist Heat;Stair training;Functional mobility training;Therapeutic activities;Therapeutic exercise;Balance training;Neuromuscular  re-education;Electrical Stimulation;Cryotherapy;Manual techniques;Patient/family education;Passive range of motion;Dry needling;Energy conservation   Consulted and Agree with Plan of Care Patient      Patient will benefit from skilled therapeutic intervention in order to improve the following deficits  and impairments:  Abnormal gait, Pain, Improper body mechanics, Postural dysfunction, Other (comment), Decreased mobility, Decreased coordination, Decreased activity tolerance, Decreased endurance, Decreased balance, Decreased safety awareness, Difficulty walking, Impaired flexibility, Decreased range of motion, Obesity, Decreased strength, Decreased scar mobility, Increased muscle spasms, Cardiopulmonary status limiting activity  Visit Diagnosis: Poor posture  Myalgia  Other abnormalities of gait and mobility       G-Codes - 11/20/2015 04/14/35    Functional Assessment Tool Used clinical judgement   Functional Limitation Self care;Mobility: Walking and moving around   Mobility: Walking and Moving Around Current Status [phone]) At least 20 percent but less than 40 percent impaired, limited or restricted   Mobility: Walking and Moving Around Goal Status [phone]) At least 1 percent but less than 20 percent impaired, limited or restricted   Self Care Current Status (O3167) At least 1 percent but less than 20 percent impaired, limited or restricted   Self Care Goal Status (O2552) At least 1 percent but less than 20 percent impaired, limited or restricted      Problem List Patient Active Problem List   Diagnosis Date Noted  . Cholecystitis 08/21/2015  . Medicare annual wellness visit, subsequent 07/24/2015  . Cerebrovascular small vessel disease 04/24/2015  . Benign paroxysmal positional vertigo 04/24/2015  . Pain in joint, pelvic region and thigh 04/24/2015  . Counseling for travel 01/11/2015  . Shoulder pain, right 11/22/2013  . Statin intolerance 10/08/2012  . Edema 05/14/2012  .  Generalized anxiety disorder 01/06/2012  . 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 Hypothyroidism 12/18/2010  . Low back pain radiating to both legs 12/18/2010  . Hip pain, right 12/18/2010  . Obesity 05/09/2010  . CAD (coronary artery disease) 05/09/2010  . Hyperlipidemia 05/09/2010  . HTN (hypertension) 05/09/2010    Jerl Mina 11/20/15, 9:45 PM  [place] MAIN [facility] SERVICES [address] [place], [place], [address] Phone: [phone]   Fax:  [phone]  Name: Meagan Cox MRN: [id number] Date of Birth: [birth year]

## 2015-11-09 ENCOUNTER — Ambulatory Visit: Payer: PPO | Admitting: Physical Therapy

## 2015-11-09 DIAGNOSIS — M791 Myalgia, unspecified site: Secondary | ICD-10-CM

## 2015-11-09 DIAGNOSIS — R2689 Other abnormalities of gait and mobility: Secondary | ICD-10-CM

## 2015-11-09 DIAGNOSIS — R293 Abnormal posture: Secondary | ICD-10-CM | POA: Diagnosis not present

## 2015-11-10 NOTE — Patient Instructions (Signed)
Continue with abdominal massage Standing 3- way hip flexion  10 reps  Standing single heel raises with support on wall  monster walking with band 10 ft x 2

## 2015-11-10 NOTE — Therapy (Signed)
[place] MAIN [facility] SERVICES [address] [place], [place], [address] Phone: [phone]   Fax:  [phone]  Physical Therapy Treatment  Patient Details  Name: Meagan Cox MRN: [id number] Date of Birth: 02-May-1940 Referring Provider: Derrel Nip MD  Encounter Date: 11/09/2015      PT End of Session - 11/10/15 0657    Visit Number 21   Date for PT Re-Evaluation 01/17/16   Authorization Type 1/10   PT Start Time 1400   PT Stop Time 1458   PT Time Calculation (min) 58 min   Activity Tolerance Patient tolerated treatment well;No increased pain   Behavior During Therapy WFL for tasks assessed/performed      Past Medical History:  Diagnosis Date  . Hyperlipidemia   . Hypertension   . Hypothyroidism     Past Surgical History:  Procedure Laterality Date  . ABDOMINAL HYSTERECTOMY    . CARDIAC CATHETERIZATION     30 years ago, [place]  . CHOLECYSTECTOMY N/A 08/21/2015   Procedure: LAPAROSCOPIC CHOLECYSTECTOMY;  Surgeon: Jules Husbands, MD;  Location: [facility] ORS;  Service: General;  Laterality: N/A;  . PARTIAL HYSTERECTOMY    . TONSILLECTOMY      There were no vitals filed for this visit.      Subjective Assessment - 11/09/15 1403    Subjective Pt reported not hurting after last session. The next two days, pt's glut pain was intense to the extent she could hardly walk. The third day the pain moved around from R glut to the side. The medial thigh pain has improved and she found the belly massage to be helpful.      Pertinent History R medial thigh pain: Pt has trouble sleeping  on L side due to L arm pain  and tries to sleep on her back and belly.  Pt has difficulty sleeping on R  side due to R hip bursitis. Hx of R knee issues (meniscus "slipping out out place" and chriporactor helps to realign). Resolved for the past 1-2 years.   Gynecological: 3 vaginal birth,  without perineal trauma.  SUI . Denied saddle signs.  Pt travels to  [place] 2x a year. Pt will be leaving in August for her next trip.              [facility] PT Assessment - 11/10/15 0654      Observation/Other Assessments   Observations decreased flinching pain, less difficulty with sit to stand, bed mobility,      Strength   Overall Strength Comments R PF standing 8 reps, L 15 reps      Palpation   Palpation comment increased fascial restrictions over R distal portion of her abdominal scar with report of sensation into her posterior glut on R, increased fascial restrictions along inguinal cancal on R (decreased post Tx)                   Pelvic Floor Special Questions - 11/10/15 0653    Pelvic Floor Internal Exam pt verbally consented without contraindications   Exam Type Vaginal   Palpation Increased mm tensions at iliococcygeus, obt int on R (post Tx: decreased and pt demo'd improved lengthening of pelvic floor with coordinated breathing)            [facility] Adult PT Treatment/Exercise - 11/10/15 0702      Therapeutic Activites    Therapeutic Activities --  see pt instructions     Manual Therapy   Manual  therapy comments manual lymph drainage over abdomen. genitofemoral triangle                      PT Long Term Goals - 11/04/15 2136      PT LONG TERM GOAL #1   Title Pt will decrease her ODI score from 24% to < 20% in order to travel on her trips to [place] and walk long distances on uneven surfaces.  (09/26/15: 12%)    Time 12   Period Weeks   Status Achieved     PT LONG TERM GOAL #2   Title Pt will decrease her PSQI score from 29% to < 24% in order to improve sleep quality and demonstrate ability to lay in a comfortable position.  (1%)   Time 12   Period Weeks   Status Achieved     PT LONG TERM GOAL #3   Title Pt will be complaint with water intake in order to promote bladder health and hydration for muscles for walking.    Time 12   Status Achieved     PT LONG TERM GOAL #4   Title Pt will demo no pelvic  asymmetries and increased hip PROM IR bilaterally > 10 deg in order to improve gait mechanics for ambulation.    Time 12   Period Weeks   Status Achieved     PT LONG TERM GOAL #5   Title Pt will demo no pain with sidestepping L with yellow resistive band at thigh across 5 steps in order to perform exercises and to return to walking longer distances and maintain proper alignment of hips and LE.   Time 12   Period Weeks   Status Achieved     PT LONG TERM GOAL #6   Title Pt will demo IND with HEP   Time 12   Period Weeks   Status Partially Met     PT LONG TERM GOAL #7   Title Pt will demo decreased thoracic/ lumbar/ gluteal mm tensions B and report no reoccurence of pain at R glut and medial to ASIS/inguinal area across 4 weeks in order to demo IND with self-maintenence to m inimize relapse of pain.    Time 12   Period Weeks   Status Partially Met               Plan - 11/10/15 3491    Clinical Impression Statement Pt is improving from her recent relapse of pain. Pt continues to show less guarding and deviations with sit to stand transfer and bed mobility. Pt shows proper alignment and less genu valgus. Pt's pain has decreased and continues to be associated with her abdominal scar restrictions. Pt is progressing to upright strengthening of R hip/LE and will continue to require skilled PT.  Pt is getting worked into her next appt this same week because PT'sschedule is full for the next 2 weeks.     Rehab Potential Good   PT Frequency 1x / week   PT Duration 12 weeks   PT Treatment/Interventions ADLs/Self Care Home Management;Aquatic Therapy;Gait training;Traction;Moist Heat;Stair training;Functional mobility training;Therapeutic activities;Therapeutic exercise;Balance training;Neuromuscular re-education;Electrical Stimulation;Cryotherapy;Manual techniques;Patient/family education;Passive range of motion;Dry needling;Energy conservation   Consulted and Agree with Plan of Care Patient       Patient will benefit from skilled therapeutic intervention in order to improve the following deficits and impairments:  Abnormal gait, Pain, Improper body mechanics, Postural dysfunction, Other (comment), Decreased mobility, Decreased coordination, Decreased activity tolerance, Decreased endurance, Decreased balance,  Decreased safety awareness, Difficulty walking, Impaired flexibility, Decreased range of motion, Obesity, Decreased strength, Decreased scar mobility, Increased muscle spasms, Cardiopulmonary status limiting activity  Visit Diagnosis: Poor posture  Myalgia  Other abnormalities of gait and mobility     Problem List Patient Active Problem List   Diagnosis Date Noted  . Cholecystitis 08/21/2015  . Medicare annual wellness visit, subsequent 07/24/2015  . Cerebrovascular small vessel disease 04/24/2015  . Benign paroxysmal positional vertigo 04/24/2015  . Pain in joint, pelvic region and thigh 04/24/2015  . Counseling for travel 01/11/2015  . Shoulder pain, right 11/22/2013  . Statin intolerance 10/08/2012  . Edema 05/14/2012  . Generalized anxiety disorder 01/06/2012  . 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 Hypothyroidism 12/18/2010  . Low back pain radiating to both legs 12/18/2010  . Hip pain, right 12/18/2010  . Obesity 05/09/2010  . CAD (coronary artery disease) 05/09/2010  . Hyperlipidemia 05/09/2010  . HTN (hypertension) 05/09/2010    Jerl Mina ,PT, DPT, E-RYT  11/10/2015, 7:02 AM  [place] MAIN [facility] SERVICES [address] [place], [place], [address] Phone: [phone]   Fax:  [phone]  Name: Meagan Cox MRN: [id number] Date of Birth: Dec 19, 1940

## 2015-11-11 ENCOUNTER — Ambulatory Visit: Payer: PPO | Admitting: Physical Therapy

## 2015-11-11 DIAGNOSIS — R293 Abnormal posture: Secondary | ICD-10-CM

## 2015-11-11 DIAGNOSIS — M791 Myalgia, unspecified site: Secondary | ICD-10-CM

## 2015-11-11 DIAGNOSIS — R2689 Other abnormalities of gait and mobility: Secondary | ICD-10-CM

## 2015-11-11 NOTE — Patient Instructions (Signed)
3 - way hip  10 x 3 / x 2 day   Replace deep core level 2 (knee out) with slider (level 4)   10 x reps progress to 2 sets x 2 sets    Pelvic tilts (wagging tail up and down) with breathing  10 x    Continue noticing ribcage expansion/ lowering of pelvic floor with inhalation , especially when taking a breath before singing

## 2015-11-11 NOTE — Therapy (Addendum)
[place] MAIN [facility] SERVICES [address] [place], [place], [address] Phone: [phone]   Fax:  [phone]  Physical Therapy Treatment  Patient Details  Name: Meagan Cox MRN: [id number] Date of Birth: 07-09-1940 Referring Provider: Derrel Nip MD  Encounter Date: 11/11/2015      PT End of Session - 11/15/15 1017    Visit Number 22   Date for PT Re-Evaluation 01/17/16   Authorization Type 2/10   PT Start Time 1400   PT Stop Time 1458   PT Time Calculation (min) 58 min   Activity Tolerance Patient tolerated treatment well;No increased pain   Behavior During Therapy WFL for tasks assessed/performed      Past Medical History:  Diagnosis Date  . Hyperlipidemia   . Hypertension   . Hypothyroidism     Past Surgical History:  Procedure Laterality Date  . ABDOMINAL HYSTERECTOMY    . CARDIAC CATHETERIZATION     30 years ago, [place]  . CHOLECYSTECTOMY N/A 08/21/2015   Procedure: LAPAROSCOPIC CHOLECYSTECTOMY;  Surgeon: Jules Husbands, MD;  Location: [facility] ORS;  Service: General;  Laterality: N/A;  . PARTIAL HYSTERECTOMY    . TONSILLECTOMY      There were no vitals filed for this visit.      Subjective Assessment - 11/15/15 1017    Subjective Pt reported she felt her R glut and R medial thigh  to be sore after last session.    Pertinent History R medial thigh pain: Pt has trouble sleeping  on L side due to L arm pain  and tries to sleep on her back and belly.  Pt has difficulty sleeping on R  side due to R hip bursitis. Hx of R knee issues (meniscus "slipping out out place" and chriporactor helps to realign). Resolved for the past 1-2 years.   Gynecological: 3 vaginal birth,  without perineal trauma.  SUI . Denied saddle signs.  Pt travels to [place] 2x a year. Pt will be leaving in August for her next trip.              [facility] PT Assessment - 11/15/15 1017      Palpation   Palpation comment increased mm tensions along  distal portion of R sacrotuberous and sacrospinous ligament with tenderness (post TXx decreased )                      [facility] Adult PT Treatment/Exercise - 11/15/15 1017      Bed Mobility   Bed Mobility --  pre-Tx: posterior pain w/ L log roll, post-Tx no pain                     PT Long Term Goals - 11/04/15 2136      PT LONG TERM GOAL #1   Title Pt will decrease her ODI score from 24% to < 20% in order to travel on her trips to [place] and walk long distances on uneven surfaces.  (09/26/15: 12%)    Time 12   Period Weeks   Status Achieved     PT LONG TERM GOAL #2   Title Pt will decrease her PSQI score from 29% to < 24% in order to improve sleep quality and demonstrate ability to lay in a comfortable position.  (1%)   Time 12   Period Weeks   Status Achieved     PT LONG TERM GOAL #3   Title Pt will be  complaint with water intake in order to promote bladder health and hydration for muscles for walking.    Time 12   Status Achieved     PT LONG TERM GOAL #4   Title Pt will demo no pelvic asymmetries and increased hip PROM IR bilaterally > 10 deg in order to improve gait mechanics for ambulation.    Time 12   Period Weeks   Status Achieved     PT LONG TERM GOAL #5   Title Pt will demo no pain with sidestepping L with yellow resistive band at thigh across 5 steps in order to perform exercises and to return to walking longer distances and maintain proper alignment of hips and LE.   Time 12   Period Weeks   Status Achieved     PT LONG TERM GOAL #6   Title Pt will demo IND with HEP   Time 12   Period Weeks   Status Partially Met     PT LONG TERM GOAL #7   Title Pt will demo decreased thoracic/ lumbar/ gluteal mm tensions B and report no reoccurence of pain at R glut and medial to ASIS/inguinal area across 4 weeks in order to demo IND with self-maintenence to m inimize relapse of pain.    Time 12   Period Weeks   Status Partially Met                Plan - 11/15/15 1018    Clinical Impression Statement Pt responded well to manual Tx that addressed tensions along sacrotuberous and sacrospinous ligament on R. Pt demo'd increased ability to move RLE with bed mobility and and continues to demo improved awareness to minimize genu valgus. At next session, plan to address remaining fascial restriction over R groin and reassess pelvic floor ROM/coordination/ contractions.     Rehab Potential Good   PT Frequency 1x / week   PT Duration 12 weeks   PT Treatment/Interventions ADLs/Self Care Home Management;Aquatic Therapy;Gait training;Traction;Moist Heat;Stair training;Functional mobility training;Therapeutic activities;Therapeutic exercise;Balance training;Neuromuscular re-education;Electrical Stimulation;Cryotherapy;Manual techniques;Patient/family education;Passive range of motion;Dry needling;Energy conservation   Consulted and Agree with Plan of Care Patient      Patient will benefit from skilled therapeutic intervention in order to improve the following deficits and impairments:  Abnormal gait, Pain, Improper body mechanics, Postural dysfunction, Other (comment), Decreased mobility, Decreased coordination, Decreased activity tolerance, Decreased endurance, Decreased balance, Decreased safety awareness, Impaired flexibility, Decreased range of motion, Obesity, Decreased strength, Decreased scar mobility, Cardiopulmonary status limiting activity, Difficulty walking  Visit Diagnosis: Poor posture  Myalgia  Other abnormalities of gait and mobility     Problem List Patient Active Problem List   Diagnosis Date Noted  . Cholecystitis 08/21/2015  . Medicare annual wellness visit, subsequent 07/24/2015  . Cerebrovascular small vessel disease 04/24/2015  . Benign paroxysmal positional vertigo 04/24/2015  . Pain in joint, pelvic region and thigh 04/24/2015  . Counseling for travel 01/11/2015  . Shoulder pain, right  11/22/2013  . Statin intolerance 10/08/2012  . Edema 05/14/2012  . Generalized anxiety disorder 01/06/2012  . 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 Hypothyroidism 12/18/2010  . Low back pain radiating to both legs 12/18/2010  . Hip pain, right 12/18/2010  . Obesity 05/09/2010  . CAD (coronary artery disease) 05/09/2010  . Hyperlipidemia 05/09/2010  . HTN (hypertension) 05/09/2010    Jerl Mina ,PT, DPT, E-RYT  11/15/2015, 10:19 AM  [place] MAIN [facility] SERVICES [address] [place], [place], [address] Phone:  [phone]   Fax:  [phone]  Name: Meagan Cox MRN: [id number] Date of Birth: [birth year]

## 2015-11-30 ENCOUNTER — Encounter: Payer: PPO | Admitting: Physical Therapy

## 2015-12-21 ENCOUNTER — Ambulatory Visit: Payer: PPO | Attending: Internal Medicine | Admitting: Physical Therapy

## 2015-12-21 DIAGNOSIS — R293 Abnormal posture: Secondary | ICD-10-CM | POA: Insufficient documentation

## 2015-12-21 DIAGNOSIS — R2689 Other abnormalities of gait and mobility: Secondary | ICD-10-CM | POA: Diagnosis not present

## 2015-12-21 DIAGNOSIS — M791 Myalgia, unspecified site: Secondary | ICD-10-CM

## 2015-12-21 NOTE — Patient Instructions (Signed)
Standing knee out on exhale 10 reps    3 way hip ext  With L foot wider under h ips  10 reps    Laying on backk, pillow under R knees, heel slides along L medial calf  10 reps    Continue with monsterwalking and other strengthening exercises

## 2015-12-21 NOTE — Therapy (Signed)
[place] MAIN [facility] SERVICES [address] [place], [place], [address] Phone: [phone]   Fax:  [phone]  Physical Therapy Treatment  Patient Details  Name: Meagan Cox MRN: [id number] Date of Birth: 12-May-1940 Referring Provider: Derrel Nip MD  Encounter Date: 12/21/2015      PT End of Session - 12/21/15 2102    Visit Number 23   Date for PT Re-Evaluation 04/16/16   Authorization Type 3/10   PT Start Time 0810   PT Stop Time 0910   PT Time Calculation (min) 60 min   Activity Tolerance Patient tolerated treatment well;No increased pain   Behavior During Therapy WFL for tasks assessed/performed      Past Medical History:  Diagnosis Date  . Hyperlipidemia   . Hypertension   . Hypothyroidism     Past Surgical History:  Procedure Laterality Date  . ABDOMINAL HYSTERECTOMY    . CARDIAC CATHETERIZATION     30 years ago, [place]  . CHOLECYSTECTOMY N/A 08/21/2015   Procedure: LAPAROSCOPIC CHOLECYSTECTOMY;  Surgeon: Jules Husbands, MD;  Location: [facility] ORS;  Service: General;  Laterality: N/A;  . PARTIAL HYSTERECTOMY    . TONSILLECTOMY      There were no vitals filed for this visit.      Subjective Assessment - 12/21/15 0814    Subjective Pt reports across the past month, she has had 10 episodes where her R leg would catch but in a different location from the original site. The pain now occurs along lateral thigh and sometimes travel along L2-L3 dermatome pattern towards inner thigh. Pt continues to have trouble laying on the L side    Pertinent History R medial thigh pain: Pt has trouble sleeping  on L side due to L arm pain  and tries to sleep on her back and belly.  Pt has difficulty sleeping on R  side due to R hip bursitis. Hx of R knee issues (meniscus "slipping out out place" and chriporactor helps to realign). Resolved for the past 1-2 years.   Gynecological: 3 vaginal birth,  without perineal trauma.  SUI . Denied  saddle signs.  Pt travels to [place] 2x a year. Pt will be leaving in August for her next trip.              [facility] PT Assessment - 12/21/15 0901      Observation/Other Assessments   Observations 3 way hip: on RLE:  with R hip hike , no hip hike on L   postTx :  RLE s way hip without hip hike     Strength   Overall Strength Comments hip abd 4-/5, R, 5/5 L   R PF standing 11, L 13 at 3/5                     [facility] Adult PT Treatment/Exercise - 12/21/15 0901      Therapeutic Activites    Therapeutic Activities --  see pt instruction     Neuro Re-ed    Neuro Re-ed Details  grade movement to minimize pain and standing SLS, 3-way ext alignment cues      Manual Therapy   Manual therapy comments manual lymph drainage over abdomen. genitofemoral triangle   MWM heel slides,hip add/abd/knee flex, heel slide L med calf                PT Education - 12/21/15 2102    Education provided Yes   Education Details  HEP   Person(s) Educated Patient   Methods Explanation;Demonstration;Tactile cues;Verbal cues;Handout   Comprehension Verbalized understanding;Returned demonstration             PT Long Term Goals - 12/21/15 0913      PT LONG TERM GOAL #1   Title Pt will decrease her ODI score from 24% to < 20% in order to travel on her trips to [place] and walk long distances on uneven surfaces.  (09/26/15: 12%)    Time 12   Period Weeks   Status Achieved     PT LONG TERM GOAL #2   Title Pt will decrease her PSQI score from 29% to < 24% in order to improve sleep quality and demonstrate ability to lay in a comfortable position.  (1%)   Time 12   Period Weeks   Status Achieved     PT LONG TERM GOAL #3   Title Pt will be complaint with water intake in order to promote bladder health and hydration for muscles for walking.    Time 12   Status Achieved     PT LONG TERM GOAL #4   Title Pt will demo no pelvic asymmetries and increased hip PROM IR bilaterally > 10  deg in order to improve gait mechanics for ambulation.    Time 12   Period Weeks   Status Achieved     PT LONG TERM GOAL #5   Title Pt will demo no pain with sidestepping L with yellow resistive band at thigh across 5 steps in order to perform exercises and to return to walking longer distances and maintain proper alignment of hips and LE.   Time 12   Period Weeks   Status Achieved     PT LONG TERM GOAL #6   Title Pt will demo IND with HEP   Time 12   Period Weeks   Status Partially Met     PT LONG TERM GOAL #7   Title Pt will demo decreased thoracic/ lumbar/ gluteal mm tensions B and report no reoccurence of pain at R glut and medial to ASIS/inguinal area across 4 weeks in order to demo IND with self-maintenence to m inimize relapse of pain.    Time 12   Period Weeks   Status Achieved     PT LONG TERM GOAL #8   Title Pt will be able to perform hip abd/ER with R heel over L thigh in order to demo less pain and to perform higher functional fitness exercises to minimize relapse of pain   Time 12   Period Weeks   Status New               Plan - 12/21/15 2103    Clinical Impression Statement Pt managed well across one month on her own but had some issues with an area that was located more lateral to the original site of complaint. Pt showed significantly decreased fascial restrictions in previous restricted areas but had increased restrictions located medial to line of AIIS within the genitofemoral triangle. As this area decreased in restrictions with manual Tx today, pt was able to demo increased abilty to tolerate hip abd/ER but not to the point of crossing her R ankle over L thigh above the knee.  At the end of the session, pt demo'd less hip hiking with hip movements along 3 planes on the RLE. Pt also showed increased plantarflexion reps in standing and less genu valgus with sit to stand today. Pt continues to  benefit from skilled PT.     Rehab Potential Good   PT Frequency  1x / week   PT Duration 12 weeks   PT Treatment/Interventions ADLs/Self Care Home Management;Aquatic Therapy;Gait training;Traction;Moist Heat;Stair training;Functional mobility training;Therapeutic activities;Therapeutic exercise;Balance training;Neuromuscular re-education;Electrical Stimulation;Cryotherapy;Manual techniques;Patient/family education;Passive range of motion;Dry needling;Energy conservation   Consulted and Agree with Plan of Care Patient      Patient will benefit from skilled therapeutic intervention in order to improve the following deficits and impairments:  Abnormal gait, Pain, Improper body mechanics, Postural dysfunction, Other (comment), Decreased mobility, Decreased coordination, Decreased activity tolerance, Decreased endurance, Decreased balance, Decreased safety awareness, Impaired flexibility, Decreased range of motion, Obesity, Decreased strength, Decreased scar mobility, Cardiopulmonary status limiting activity, Difficulty walking  Visit Diagnosis: Poor posture  Myalgia  Other abnormalities of gait and mobility     Problem List Patient Active Problem List   Diagnosis Date Noted  . Cholecystitis 08/21/2015  . Medicare annual wellness visit, subsequent 07/24/2015  . Cerebrovascular small vessel disease 04/24/2015  . Benign paroxysmal positional vertigo 04/24/2015  . Pain in joint, pelvic region and thigh 04/24/2015  . Counseling for travel 01/11/2015  . Shoulder pain, right 11/22/2013  . Statin intolerance 10/08/2012  . Edema 05/14/2012  . Generalized anxiety disorder 01/06/2012  . 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 Hypothyroidism 12/18/2010  . Low back pain radiating to both legs 12/18/2010  . Hip pain, right 12/18/2010  . Obesity 05/09/2010  . CAD (coronary artery disease) 05/09/2010  . Hyperlipidemia 05/09/2010  . HTN (hypertension) 05/09/2010    Jerl Mina ,PT, DPT, E-RYT  12/21/2015, 9:11 PM  [place] MAIN [facility] SERVICES [address] [place], [place], [address] Phone: [phone]   Fax:  [phone]  Name: Neftali Thurow MRN: [id number] Date of Birth: [birth year]

## 2016-01-03 ENCOUNTER — Ambulatory Visit: Payer: PPO | Attending: Internal Medicine | Admitting: Physical Therapy

## 2016-01-03 DIAGNOSIS — R2689 Other abnormalities of gait and mobility: Secondary | ICD-10-CM | POA: Insufficient documentation

## 2016-01-03 DIAGNOSIS — M791 Myalgia, unspecified site: Secondary | ICD-10-CM

## 2016-01-03 DIAGNOSIS — R293 Abnormal posture: Secondary | ICD-10-CM

## 2016-01-04 NOTE — Therapy (Signed)
[place] MAIN [facility] SERVICES [address] [place], [place], [address] Phone: [phone]   Fax:  [phone]  Physical Therapy Treatment  Patient Details  Name: Meagan Cox MRN: [id number] Date of Birth: 1941/01/07 Referring Provider: Derrel Nip MD  Encounter Date: 01/03/2016      PT End of Session - 01/04/16 2321    Visit Number 24   Date for PT Re-Evaluation 04/16/16   Authorization Type 4/10   PT Start Time 1110   PT Stop Time 1240   PT Time Calculation (min) 90 min   Activity Tolerance Patient tolerated treatment well;No increased pain   Behavior During Therapy WFL for tasks assessed/performed      Past Medical History:  Diagnosis Date  . Hyperlipidemia   . Hypertension   . Hypothyroidism     Past Surgical History:  Procedure Laterality Date  . ABDOMINAL HYSTERECTOMY    . CARDIAC CATHETERIZATION     30 years ago, [place]  . CHOLECYSTECTOMY N/A 08/21/2015   Procedure: LAPAROSCOPIC CHOLECYSTECTOMY;  Surgeon: Jules Husbands, MD;  Location: [facility] ORS;  Service: General;  Laterality: N/A;  . PARTIAL HYSTERECTOMY    . TONSILLECTOMY      There were no vitals filed for this visit.      Subjective Assessment - 01/04/16 2327    Subjective Pt reported that she days are better than others but the pain is not worst. Pt is able to to slide her heel with R hip ER/ abd without pain. Pt is not able to lay on her R hip and that the pain is worst if she gets real tired or gardening work (bending over and pulling weeds) . Pt states she feels the R leg is still a blessing compared to when she started PT.     Pertinent History R medial thigh pain: Pt has trouble sleeping  on L side due to L arm pain  and tries to sleep on her back and belly.  Pt has difficulty sleeping on R  side due to R hip bursitis. Hx of R knee issues (meniscus "slipping out out place" and chriporactor helps to realign). Resolved for the past 1-2 years.   Gynecological:  3 vaginal birth,  without perineal trauma.  SUI . Denied saddle signs.  Pt travels to [place] 2x a year. Pt will be leaving in August for her next trip.              [facility] PT Assessment - 01/04/16 2309      AROM   AROM Assessment Site --  R heel slide to knee in hip ER at 0deg, abd no pain      PROM   Overall PROM Comments pain w/ hip ER > 0 deg/ abd pre Tx, post Tx less pain      Palpation   Palpation comment increased mm tensions at ischial tubosity on R with referred pain to medial thigh      Ambulation/Gait   Gait Comments increased foot clearance, deep core activation, but R genu valgus more dominant than L   narrow BOS                  Pelvic Floor Special Questions - 01/04/16 2313    Pelvic Floor Internal Exam pt verbally consented without contraindications   Exam Type Vaginal   Palpation increased mm obt int B, delayed lengthening of pelvic floor mm  [facility] Adult PT Treatment/Exercise - 01/04/16 2314      Therapeutic Activites    Therapeutic Activities --  see pt instructions     Neuro Re-ed    Neuro Re-ed Details  gait training,      Manual Therapy   Manual therapy comments internal STM , thiele massage at obt int B, externally at proximal attachment of adductor magnus R/ ischial tuberiosity, STM with MWM hip add.abd.ER/IR                       PT Long Term Goals - 01/04/16 2327      PT LONG TERM GOAL #1   Title Pt will decrease her ODI score from 24% to < 20% in order to travel on her trips to [place] and walk long distances on uneven surfaces.  (09/26/15: 12%)    Time 12   Period Weeks   Status Achieved     PT LONG TERM GOAL #2   Title Pt will decrease her PSQI score from 29% to < 24% in order to improve sleep quality and demonstrate ability to lay in a comfortable position.  (1%)   Time 12   Period Weeks   Status Achieved     PT LONG TERM GOAL #3   Title Pt will be complaint with water intake in order to promote  bladder health and hydration for muscles for walking.    Time 12   Status Achieved     PT LONG TERM GOAL #4   Title Pt will demo no pelvic asymmetries and increased hip PROM IR bilaterally > 10 deg in order to improve gait mechanics for ambulation.    Time 12   Period Weeks   Status Achieved     PT LONG TERM GOAL #5   Title Pt will demo no pain with sidestepping L with yellow resistive band at thigh across 5 steps in order to perform exercises and to return to walking longer distances and maintain proper alignment of hips and LE.   Time 12   Period Weeks   Status Achieved     Additional Long Term Goals   Additional Long Term Goals Yes     PT LONG TERM GOAL #6   Title Pt will demo IND with HEP   Time 12   Period Weeks   Status Partially Met     PT LONG TERM GOAL #7   Title Pt will demo decreased thoracic/ lumbar/ gluteal mm tensions B and report no reoccurence of pain at R glut and medial to ASIS/inguinal area across 4 weeks in order to demo IND with self-maintenence to m inimize relapse of pain.    Time 12   Period Weeks   Status Achieved     PT LONG TERM GOAL #8   Title Pt will be able to perform hip abd/ER with R heel over L thigh in order to demo less pain and to perform higher functional fitness exercises to minimize relapse of pain   Time 12   Period Weeks   Status New     PT LONG TERM GOAL  #9   TITLE Pt will report to be able lay on her R side to sleep in order to improve QOL   Time 12   Period Weeks   Status New               Plan - 01/04/16 2321    Clinical Impression Statement Pt showed increased hip  ER/ abd on RLE and lengthened pelvic floor coordination following Tx. Suspect increased pelvic floor mm tensions and R adductor magnus are related to her remaining deficits. Pt will continue to benefit from skilled PT.    Rehab Potential Good   PT Frequency 1x / week   PT Duration 12 weeks   PT Treatment/Interventions ADLs/Self Care Home  Management;Aquatic Therapy;Gait training;Traction;Moist Heat;Stair training;Functional mobility training;Therapeutic activities;Therapeutic exercise;Balance training;Neuromuscular re-education;Electrical Stimulation;Cryotherapy;Manual techniques;Patient/family education;Passive range of motion;Dry needling;Energy conservation      Patient will benefit from skilled therapeutic intervention in order to improve the following deficits and impairments:  Abnormal gait, Pain, Improper body mechanics, Postural dysfunction, Other (comment), Decreased mobility, Decreased coordination, Decreased activity tolerance, Decreased endurance, Decreased balance, Decreased safety awareness, Impaired flexibility, Decreased range of motion, Obesity, Decreased strength, Decreased scar mobility, Cardiopulmonary status limiting activity, Difficulty walking  Visit Diagnosis: Poor posture  Myalgia  Other abnormalities of gait and mobility     Problem List Patient Active Problem List   Diagnosis Date Noted  . Cholecystitis 08/21/2015  . Medicare annual wellness visit, subsequent 07/24/2015  . Cerebrovascular small vessel disease 04/24/2015  . Benign paroxysmal positional vertigo 04/24/2015  . Pain in joint, pelvic region and thigh 04/24/2015  . Counseling for travel 01/11/2015  . Shoulder pain, right 11/22/2013  . Statin intolerance 10/08/2012  . Edema 05/14/2012  . Generalized anxiety disorder 01/06/2012  . 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 Hypothyroidism 12/18/2010  . Low back pain radiating to both legs 12/18/2010  . Hip pain, right 12/18/2010  . Obesity 05/09/2010  . CAD (coronary artery disease) 05/09/2010  . Hyperlipidemia 05/09/2010  . HTN (hypertension) 05/09/2010    Jerl Mina ,PT, DPT, E-RYT  01/04/2016, 11:33 PM  [place] MAIN [facility] SERVICES [address] [place], [place], [address] Phone: [phone]   Fax:  [phone]  Name: Meagan Cox MRN: [id number] Date of Birth: 07/09/1940

## 2016-01-04 NOTE — Patient Instructions (Signed)
Heel slides for deep core exercises (level 4 modified)   Heel slides R KNEE/ bent out, relaxed on pillows    Monster walking with knee alignment along 2-3 rd toes.

## 2016-01-06 ENCOUNTER — Ambulatory Visit: Payer: PPO | Admitting: Physical Therapy

## 2016-01-06 DIAGNOSIS — M791 Myalgia, unspecified site: Secondary | ICD-10-CM

## 2016-01-06 DIAGNOSIS — R293 Abnormal posture: Secondary | ICD-10-CM | POA: Diagnosis not present

## 2016-01-06 DIAGNOSIS — R2689 Other abnormalities of gait and mobility: Secondary | ICD-10-CM

## 2016-01-06 NOTE — Therapy (Addendum)
[place] MAIN [facility] SERVICES [address] [place], [place], [address] Phone: [phone]   Fax:  [phone]  Physical Therapy Treatment  Patient Details  Name: Meagan Cox MRN: [id number] Date of Birth: Sep 02, 1940 Referring Provider: Derrel Nip MD  Encounter Date: 01/06/2016      PT End of Session - 01/06/16 1041    Visit Number 25   Date for PT Re-Evaluation 04/16/16   Authorization Type 5/10   PT Start Time 0930   PT Stop Time 1045   PT Time Calculation (min) 75 min   Activity Tolerance Patient tolerated treatment well;No increased pain   Behavior During Therapy WFL for tasks assessed/performed      Past Medical History:  Diagnosis Date  . Hyperlipidemia   . Hypertension   . Hypothyroidism     Past Surgical History:  Procedure Laterality Date  . ABDOMINAL HYSTERECTOMY    . CARDIAC CATHETERIZATION     30 years ago, [place]  . CHOLECYSTECTOMY N/A 08/21/2015   Procedure: LAPAROSCOPIC CHOLECYSTECTOMY;  Surgeon: Jules Husbands, MD;  Location: [facility] ORS;  Service: General;  Laterality: N/A;  . PARTIAL HYSTERECTOMY    . TONSILLECTOMY      There were no vitals filed for this visit.      Subjective Assessment - 01/06/16 0936    Subjective Pt rpeorted her R glut area has caused pain with sitting  7-8/10. The lateral thigh area is 40% better compared to last session. The anterior thigh is "still not as bad" and " is getting better" at 40%. Ptstates she notices her knee rub against each other when she walks, but has been trying to pay attention to aligning her knee when she walks and gets up for her chair. Regarding Hx of R knee, Pt reports she has had R knee issue after a fall years ago and injuried her menscius. Pt only had followed up with chirporactic Tx.     Pertinent History R medial thigh pain: Pt has trouble sleeping  on L side due to L arm pain  and tries to sleep on her back and belly.  Pt has difficulty sleeping on R   side due to R hip bursitis. Hx of R knee issues (meniscus "slipping out out place" and chriporactor helps to realign). Resolved for the past 1-2 years.   Gynecological: 3 vaginal birth,  without perineal trauma.  SUI . Denied saddle signs.  Pt travels to [place] 2x a year. Pt will be leaving in August for her next trip.              [facility] PT Assessment - 01/06/16 0941      Observation/Other Assessments   Observations hip angle ASIS to top of patella B 115 deg, genu valgus angle bottom of patella to 2nd metatarsal R 5 deg, L 16 deg       Other:   Other/ Comments poor alignment in extended side angle, narrow BOS and LOB     Palaption at distal attachment of adductor above medial tibial plateau referred pain to anterior thigh pre Tx, and no referral of pain post Tx               Pelvic Floor Special Questions - 01/06/16 1051    Pelvic Floor Internal Exam pt verbally consented without contraindications   Exam Type Vaginal   Palpation decreased mm tensions at B obt int, slight tensions at     improved pelvic floor lengethening  [facility] Adult PT Treatment/Exercise - 01/06/16 1052      Neuro Re-ed    Neuro Re-ed Details  extended angle alignment, pelvic floor lengthening, lower kinetic chain propioception      Manual Therapy   Manual therapy comments anterior drawer and PA mob at tibial plateau with ER of femur  (Grade II), STM at adductor longus from distal to mid thigh level to release fascial restrictions    Internal Pelvic Floor STM along                PT Education - 01/06/16 1051    Education provided Yes   Education Details HEP   Person(s) Educated Patient   Methods Explanation;Demonstration;Tactile cues;Verbal cues;Handout   Comprehension Verbalized understanding;Returned demonstration             PT Long Term Goals - 01/04/16 2327      PT LONG TERM GOAL #1   Title Pt will decrease her ODI score from 24% to < 20% in order to travel on  her trips to [place] and walk long distances on uneven surfaces.  (09/26/15: 12%)    Time 12   Period Weeks   Status Achieved     PT LONG TERM GOAL #2   Title Pt will decrease her PSQI score from 29% to < 24% in order to improve sleep quality and demonstrate ability to lay in a comfortable position.  (1%)   Time 12   Period Weeks   Status Achieved     PT LONG TERM GOAL #3   Title Pt will be complaint with water intake in order to promote bladder health and hydration for muscles for walking.    Time 12   Status Achieved     PT LONG TERM GOAL #4   Title Pt will demo no pelvic asymmetries and increased hip PROM IR bilaterally > 10 deg in order to improve gait mechanics for ambulation.    Time 12   Period Weeks   Status Achieved     PT LONG TERM GOAL #5   Title Pt will demo no pain with sidestepping L with yellow resistive band at thigh across 5 steps in order to perform exercises and to return to walking longer distances and maintain proper alignment of hips and LE.   Time 12   Period Weeks   Status Achieved     Additional Long Term Goals   Additional Long Term Goals Yes     PT LONG TERM GOAL #6   Title Pt will demo IND with HEP   Time 12   Period Weeks   Status Partially Met     PT LONG TERM GOAL #7   Title Pt will demo decreased thoracic/ lumbar/ gluteal mm tensions B and report no reoccurence of pain at R glut and medial to ASIS/inguinal area across 4 weeks in order to demo IND with self-maintenence to m inimize relapse of pain.    Time 12   Period Weeks   Status Achieved     PT LONG TERM GOAL #8   Title Pt will be able to perform hip abd/ER with R heel over L thigh in order to demo less pain and to perform higher functional fitness exercises to minimize relapse of pain   Time 12   Period Weeks   Status New     PT LONG TERM GOAL  #9   TITLE Pt will report to be able lay on her R side to sleep in order  to improve QOL   Time 12   Period Weeks   Status New                Plan - 01/06/16 1043    Clinical Impression Statement Following today's session, Pt stated she is thrilled to walk without hurting and she demo'd less genu valgus in L knee and less contact between the miedial aspects of the knees.  Pt also showed improvement since last session with increased ability to ER and abduct R hip to bring heel to mid thigh of LLE instead of below knee level. Following neuromuscular training to activate the R foot evertion muscles to minimize pronation. Pt tolerated manual Tx withdecreased pain at distal attachment of adductors and showed increased ER of femur over tibia when extending knee in open kinetic chain position. Pt continues to benefit from skilled PT.    Rehab Potential Good   PT Frequency 1x / week   PT Duration 12 weeks   PT Treatment/Interventions ADLs/Self Care Home Management;Aquatic Therapy;Gait training;Traction;Moist Heat;Stair training;Functional mobility training;Therapeutic activities;Therapeutic exercise;Balance training;Neuromuscular re-education;Electrical Stimulation;Cryotherapy;Manual techniques;Patient/family education;Passive range of motion;Dry needling;Energy conservation      Patient will benefit from skilled therapeutic intervention in order to improve the following deficits and impairments:  Abnormal gait, Pain, Improper body mechanics, Postural dysfunction, Other (comment), Decreased mobility, Decreased coordination, Decreased activity tolerance, Decreased endurance, Decreased balance, Decreased safety awareness, Impaired flexibility, Decreased range of motion, Obesity, Decreased strength, Decreased scar mobility, Cardiopulmonary status limiting activity, Difficulty walking  Visit Diagnosis: Poor posture    Myalgia  Other abnormalities of gait and mobility     Problem List Patient Active Problem List   Diagnosis Date Noted  . Cholecystitis 08/21/2015  . Medicare annual wellness visit, subsequent 07/24/2015   . Cerebrovascular small vessel disease 04/24/2015  . Benign paroxysmal positional vertigo 04/24/2015  . Pain in joint, pelvic region and thigh 04/24/2015  . Counseling for travel 01/11/2015  . Shoulder pain, right 11/22/2013  . Statin intolerance 10/08/2012  . Edema 05/14/2012  . Generalized anxiety disorder 01/06/2012  . 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 Hypothyroidism 12/18/2010  . Low back pain radiating to both legs 12/18/2010  . Hip pain, right 12/18/2010  . Obesity 05/09/2010  . CAD (coronary artery disease) 05/09/2010  . Hyperlipidemia 05/09/2010  . HTN (hypertension) 05/09/2010    Jerl Mina ,PT, DPT, E-RYT  01/06/2016, 10:41 PM  [place] MAIN [facility] SERVICES [address] [place], [place], [address] Phone: [phone]   Fax:  [phone]  Name: Meagan Cox MRN: [id number] Date of Birth: [birth year]

## 2016-01-06 NOTE — Patient Instructions (Addendum)
Heel slide with hip /knee out   Add the belt exercise to press ballmound and spread toes and then 5 -10 reps of bending and straightening R knee.   Pay attention to breathing for pelvic floor with mini squats to stretch pelvic floor   Walking: with equal weight bearing across ballmounds instead of the edge of the foot   Extended angle: with wider feet like on ski tracks (hip width apart) and follow cues was practiced

## 2016-01-09 ENCOUNTER — Ambulatory Visit (INDEPENDENT_AMBULATORY_CARE_PROVIDER_SITE_OTHER): Payer: PPO

## 2016-01-09 VITALS — BP 138/80 | HR 72 | Temp 97.7°F | Resp 14 | Ht 63.0 in | Wt 206.0 lb

## 2016-01-09 DIAGNOSIS — Z23 Encounter for immunization: Secondary | ICD-10-CM

## 2016-01-09 DIAGNOSIS — Z0000 Encounter for general adult medical examination without abnormal findings: Secondary | ICD-10-CM

## 2016-01-09 NOTE — Progress Notes (Signed)
Subjective:   Meagan Cox is a 75 y.o. female who presents for Medicare Annual (Subsequent) preventive examination.  Review of Systems:  No ROS.  Medicare Wellness Visit.  Cardiac Risk Factors include: advanced age (>60men, >51 women);hypertension;obesity (BMI >30kg/m2)     Objective:     Vitals: BP 138/80 (BP Location: Right Arm, Patient Position: Sitting, Cuff Size: Large)   Pulse 72   Temp 97.7 F (36.5 C) (Oral)   Resp 14   Ht 5\' 3"  (1.6 m)   Wt 206 lb (93.4 kg)   SpO2 95%   BMI 36.49 kg/m   Body mass index is 36.49 kg/m.   Tobacco History  Smoking Status  . Former Smoker  . Years: 20.00  . Types: Cigarettes  . Quit date: 01/30/1988  Smokeless Tobacco  . Never Used     Counseling given: Not Answered   Past Medical History:  Diagnosis Date  . Hyperlipidemia   . Hypertension   . Hypothyroidism    Past Surgical History:  Procedure Laterality Date  . ABDOMINAL HYSTERECTOMY    . CARDIAC CATHETERIZATION     30 years ago,   . CHOLECYSTECTOMY N/A 08/21/2015   Procedure: LAPAROSCOPIC CHOLECYSTECTOMY;  Surgeon: Jules Husbands, MD;  Location: [facility] ORS;  Service: General;  Laterality: N/A;  . PARTIAL HYSTERECTOMY    . TONSILLECTOMY     Family History  Problem Relation Age of Onset  . Heart disease Mother     heart problems  . Hypothyroidism Mother   . Cancer Father     lymphoma   History  Sexual Activity  . Sexual activity: Not Currently    Outpatient Encounter Prescriptions as of 01/09/2016  Medication Sig  . diclofenac (CATAFLAM) 50 MG tablet TAKE ONE TABLET BY MOUTH TWICE DAILY  . estradiol (ESTRACE) 2 MG tablet TAKE ONE TABLET BY MOUTH ONCE DAILY  . levothyroxine (SYNTHROID, LEVOTHROID) 125 MCG tablet TAKE ONE TABLET BY MOUTH ONCE DAILY  . Multiple Vitamin (MULTIVITAMIN) tablet Take 1 tablet by mouth daily.    Marland Kitchen ALPRAZolam (XANAX) 0.5 MG tablet Take 1 tablet (0.5 mg total) by mouth at bedtime as needed. (Patient not taking:  Reported on 01/09/2016)  . benzonatate (TESSALON) 100 MG capsule Take 2 capsules (200 mg total) by mouth 2 (two) times daily as needed for cough. (Patient not taking: Reported on 01/09/2016)  . Ivermectin 1 % CREA Apply 1 application topically daily. (Patient not taking: Reported on 01/09/2016)  . Red Yeast Rice 600 MG CAPS Take 1 capsule (600 mg total) by mouth 2 (two) times daily. (Patient not taking: Reported on 01/09/2016)  . Tdap (BOOSTRIX) 5-2.5-18.5 LF-MCG/0.5 injection Inject 0.5 mLs into the muscle once. (Patient not taking: Reported on 01/09/2016)  . [DISCONTINUED] Cyanocobalamin (B-12) 2500 MCG TABS Take 2,500 mcg by mouth daily. Reported on 07/22/2015  . [DISCONTINUED] diazepam (VALIUM) 5 MG tablet Take 1 tablet (5 mg total) by mouth every 12 (twelve) hours as needed for anxiety. (Patient not taking: Reported on 07/22/2015)  . [DISCONTINUED] levothyroxine (SYNTHROID, LEVOTHROID) 150 MCG tablet Take 150 mcg by mouth daily before breakfast.  . [DISCONTINUED] oxyCODONE-acetaminophen (PERCOCET/ROXICET) 5-325 MG tablet Take 1-2 tablets by mouth every 4 (four) hours as needed for moderate pain.  . [DISCONTINUED] predniSONE (DELTASONE) 10 MG tablet 6 tablets on Day 1 , then reduce by 1 tablet daily until gone (Patient not taking: Reported on 07/22/2015)   No facility-administered encounter medications on file as of 01/09/2016.     Activities  of Daily Living In your present state of health, do you have any difficulty performing the following activities: 01/09/2016 08/21/2015  Hearing? N -  Vision? N -  Difficulty concentrating or making decisions? N -  Walking or climbing stairs? N -  Dressing or bathing? N -  Doing errands, shopping? N Y  Conservation officer, nature and eating ? N -  Using the Toilet? N -  In the past six months, have you accidently leaked urine? N -  Do you have problems with loss of bowel control? N -  Managing your Medications? N -  Managing your Finances? N -  Housekeeping or  managing your Housekeeping? N -  Some recent data might be hidden    Patient Care Team: Crecencio Mc, MD as PCP - General (Internal Medicine)    Assessment:    This is a routine wellness examination for Meagan Cox. The goal of the wellness visit is to assist the patient how to close the gaps in care and create a preventative care plan for the patient.   Osteoporosis risk reviewed.  Medications reviewed; taking without issues or barriers.  Safety issues reviewed; lives with daughter.  Smoke detectors in the home. No firearms in the home. Wears seatbelts when driving or riding with others. No violence in the home.  No identified risk were noted; The patient was oriented x 3; appropriate in dress and manner and no objective failures at ADL's or IADL's.   BMI; discussed the importance of a healthy diet, water intake and exercise. Educational material provided.  High dose influenza vaccine administered [name], tolerated well.  Educational material provided.  DEXA SCAN and MM orders previously placed; she is awaiting appointment.  Educational material provided.  Patient Concerns: None at this time. Follow up with PCP as needed.  Exercise Activities and Dietary recommendations Current Exercise Habits: Structured exercise class (Physical therapy), Time (Minutes): 60, Frequency (Times/Week): 1, Weekly Exercise (Minutes/Week): 60, Intensity: Mild  Goals    . Increase physical activity           Stay active and maintain PT therapy exercises. Walk a minimum of 30 minutes with daughter around the neighborhood as often as possible, as tolerated. Gym exercises 2-3 times weekly for at 30 minutes.      Fall Risk Fall Risk  01/09/2016 01/07/2015 11/19/2013 01/04/2012  Falls in the past year? No No Yes No  Risk for fall due to : - - Impaired balance/gait -   Depression Screen PHQ 2/9 Scores 01/09/2016 01/07/2015 11/19/2013 01/04/2012  PHQ - 2 Score 0 0 0 0     Cognitive  Function MMSE - Mini Mental State Exam 01/07/2015  Orientation to time 5  Orientation to Place 5  Registration 3  Attention/ Calculation 5  Recall 3  Language- name 2 objects 2  Language- repeat 1  Language- follow 3 step command 3  Language- read & follow direction 1  Write a sentence 1  Copy design 1  Total score 30     6CIT Screen 01/09/2016  What Year? 0 points  What month? 0 points  What time? 0 points  Count back from 20 0 points  Months in reverse 0 points  Repeat phrase 0 points  Total Score 0    Immunization History  Administered Date(s) Administered  . Influenza Split 02/09/2011, 12/02/2014  . Influenza, High Dose Seasonal PF 01/09/2016  . Influenza,inj,Quad PF,36+ Mos 10/08/2012, 11/19/2013  . Pneumococcal Conjugate-13 01/10/2015  . Pneumococcal Polysaccharide-23 11/04/2012  .  Zoster 02/13/2013   Screening Tests Health Maintenance  Topic Date Due  . TETANUS/TDAP  02/02/1961  . COLONOSCOPY  02/03/1992  . DEXA SCAN  02/03/2007  . MAMMOGRAM  04/09/2013  . INFLUENZA VACCINE  Addressed  . ZOSTAVAX  Completed  . PNA vac Low Risk Adult  Completed      Plan:    End of life planning; Advance aging; Advanced directives discussed. Copy of current HCPOA/Living Will requested.  Medicare Attestation I have personally reviewed: The patient's medical and social history Their use of alcohol, tobacco or illicit drugs Their current medications and supplements The patient's functional ability including ADLs,fall risks, home safety risks, cognitive, and hearing and visual impairment Diet and physical activities Evidence for depression   The patient's weight, height, BMI, and visual acuity have been recorded in the chart.  I have made referrals and provided education to the patient based on review of the above and I have provided the patient with a written personalized care plan for preventive services.    During the course of the visit the patient was educated and  counseled about the following appropriate screening and preventive services:   Vaccines to include Pneumoccal, Influenza, Hepatitis B, Td, Zostavax, HCV  Electrocardiogram  Cardiovascular Disease  Colorectal cancer screening  Bone density screening  Diabetes screening  Glaucoma screening  Mammography/PAP  Nutrition counseling   Patient Instructions (the written plan) was given to the patient.   Varney Biles, LPN  [id number]

## 2016-01-09 NOTE — Patient Instructions (Addendum)
Meagan Cox , Thank you for taking time to come for your Medicare Wellness Visit. I appreciate your ongoing commitment to your health goals. Please review the following plan we discussed and let me know if I can assist you in the future.   FOLLOW UP WITH DR. Derrel Nip AS NEEDED.  These are the goals we discussed: Goals    . Increase physical activity           Stay active and maintain PT therapy exercises. Walk a minimum of 30 minutes with daughter around the neighborhood as often as possible, as tolerated. Gym exercises 2-3 times weekly for at 30 minutes.       This is a list of the screening recommended for you and due dates:  Health Maintenance  Topic Date Due  . Tetanus Vaccine  02/02/1961  . Colon Cancer Screening  02/03/1992  . DEXA scan (bone density measurement)  02/03/2007  . Mammogram  04/09/2013  . Flu Shot  Addressed  . Shingles Vaccine  Completed  . Pneumonia vaccines  Completed     Bone Densitometry Introduction Bone densitometry is an imaging test that uses a special X-ray to measure the amount of calcium and other minerals in your bones (bone density). This test is also known as a bone mineral density test or dual-energy X-ray absorptiometry (DXA). The test can measure bone density at your hip and your spine. It is similar to having a regular X-ray. You may have this test to:  Diagnose a condition that causes weak or thin bones (osteoporosis).  Predict your risk of a broken bone (fracture).  Determine how well osteoporosis treatment is working. Tell a health care provider about:  Any allergies you have.  All medicines you are taking, including vitamins, herbs, eye drops, creams, and over-the-counter medicines.  Any problems you or family members have had with anesthetic medicines.  Any blood disorders you have.  Any surgeries you have had.  Any medical conditions you have.  Possibility of pregnancy.  Any other medical test you had within the  previous 14 days that used contrast material. What are the risks? Generally, this is a safe procedure. However, problems can occur and may include the following:  This test exposes you to a very small amount of radiation.  The risks of radiation exposure may be greater to unborn children. What happens before the procedure?  Do not take any calcium supplements for 24 hours before having the test. You can otherwise eat and drink what you usually do.  Take off all metal jewelry, eyeglasses, dental appliances, and any other metal objects. What happens during the procedure?  You may lie on an exam table. There will be an X-ray generator below you and an imaging device above you.  Other devices, such as boxes or braces, may be used to position your body properly for the scan.  You will need to lie still while the machine slowly scans your body.  The images will show up on a computer monitor. What happens after the procedure? You may need more testing at a later time. This information is not intended to replace advice given to you by your health care provider. Make sure you discuss any questions you have with your health care provider. Document Released: 02/07/2004 Document Revised: 06/23/2015 Document Reviewed: 06/25/2013  2017 Elsevier   Mammogram A mammogram is an X-ray of the breasts that is done to check for abnormal changes. This procedure can screen for and detect any changes that  may suggest breast cancer. A mammogram can also identify other changes and variations in the breast, such as:  Inflammation of the breast tissue (mastitis).  An infected area that contains a collection of pus (abscess).  A fluid-filled sac (cyst).  Fibrocystic changes. This is when breast tissue becomes denser, which can make the tissue feel rope-like or uneven under the skin.  Tumors that are not cancerous (benign). Tell a health care provider about:  Any allergies you have.  If you have breast  implants.  If you have had previous breast disease, biopsy, or surgery.  If you are breastfeeding.  Any possibility that you could be pregnant, if this applies.  If you are younger than age 40.  If you have a family history of breast cancer. What are the risks? Generally, this is a safe procedure. However, problems may occur, including:  Exposure to radiation. Radiation levels are very low with this test.  The results being misinterpreted.  The need for further tests.  The inability of the mammogram to detect certain cancers. What happens before the procedure?  Schedule your test about 1-2 weeks after your menstrual period. This is usually when your breasts are the least tender.  If you have had a mammogram done at a different facility in the past, get the mammogram X-rays or have them sent to your current exam facility in order to compare them.  Wash your breasts and under your arms the day of the test.  Do not wear deodorants, perfumes, lotions, or powders anywhere on your body on the day of the test.  Remove any jewelry from your neck.  Wear clothes that you can change into and out of easily. What happens during the procedure?  You will undress from the waist up and put on a gown.  You will stand in front of the X-ray machine.  Each breast will be placed between two plastic or glass plates. The plates will compress your breast for a few seconds. Try to stay as relaxed as possible during the procedure. This does not cause any harm to your breasts and any discomfort you feel will be very brief.  X-rays will be taken from different angles of each breast. The procedure may vary among health care providers and hospitals. What happens after the procedure?  The mammogram will be examined by a specialist (radiologist).  You may need to repeat certain parts of the test, depending on the quality of the images. This is commonly done if the radiologist needs a better view of  the breast tissue.  Ask when your test results will be ready. Make sure you get your test results.  You may resume your normal activities. This information is not intended to replace advice given to you by your health care provider. Make sure you discuss any questions you have with your health care provider. Document Released: 01/13/2000 Document Revised: 06/20/2015 Document Reviewed: 03/26/2014 Elsevier Interactive Patient Education  2017 [employer].

## 2016-01-10 NOTE — Progress Notes (Signed)
  I have reviewed the above information and agree with above.   Dalyn Becker, MD 

## 2016-01-17 ENCOUNTER — Ambulatory Visit: Payer: PPO | Admitting: Physical Therapy

## 2016-01-17 DIAGNOSIS — M791 Myalgia, unspecified site: Secondary | ICD-10-CM

## 2016-01-17 DIAGNOSIS — R293 Abnormal posture: Secondary | ICD-10-CM

## 2016-01-17 DIAGNOSIS — R2689 Other abnormalities of gait and mobility: Secondary | ICD-10-CM

## 2016-01-17 NOTE — Therapy (Addendum)
[place] MAIN [facility] SERVICES [address] [place], [place], [address] Phone: [phone]   Fax:  [phone]  Physical Therapy Treatment [facility] Note  Patient Details  Name: Meagan Cox MRN: [id number] Date of Birth: 08/04/1940 Referring Provider: Derrel Nip MD  Encounter Date: 01/17/2016      PT End of Session - 01/17/16 0912    Visit Number 26   Date for PT Re-Evaluation 04/16/16   Authorization Type 6/10   PT Start Time 0811   PT Stop Time 0912   PT Time Calculation (min) 61 min   Activity Tolerance Patient tolerated treatment well;No increased pain   Behavior During Therapy WFL for tasks assessed/performed      Past Medical History:  Diagnosis Date  . Hyperlipidemia   . Hypertension   . Hypothyroidism     Past Surgical History:  Procedure Laterality Date  . ABDOMINAL HYSTERECTOMY    . CARDIAC CATHETERIZATION     30 years ago, Derma  . CHOLECYSTECTOMY N/A 08/21/2015   Procedure: LAPAROSCOPIC CHOLECYSTECTOMY;  Surgeon: Jules Husbands, MD;  Location: [facility] ORS;  Service: General;  Laterality: N/A;  . PARTIAL HYSTERECTOMY    . TONSILLECTOMY      There were no vitals filed for this visit.      Subjective Assessment - 01/17/16 0816    Subjective Pt reported there is not much to complain about since the last session. Once in a while the hip will catch in the groin area  but that is a blessing to me because it just to be so bad. Pt's biggest problem is she can not lay on her R side.     Pertinent History R medial thigh pain: Pt has trouble sleeping  on L side due to L arm pain  and tries to sleep on her back and belly.  Pt has difficulty sleeping on R  side due to R hip bursitis. Hx of R knee issues (meniscus "slipping out out place" and chriporactor helps to realign). Resolved for the past 1-2 years.   Gynecological: 3 vaginal birth,  without perineal trauma.  SUI . Denied saddle signs.  Pt travels to [place] 2x a year. Pt  will be leaving in August for her next trip.              [facility] PT Assessment - 01/17/16 0905      AROM   AROM Assessment Site --  heel slide w/ hip ER/abd of R LE above  L knee      PROM   Overall PROM Comments no pain withhip ER in supine > ~30     Ambulation/Gait   Gait Comments pre Tx: heel strike, minimial hip mobility,  Post Tx: midfoot striking and cued for increased hip flexion                  Pelvic Floor Special Questions - 01/17/16 0900    Prolapse Anterior Wall  within introitus   Pelvic Floor Internal Exam pt verbally consented without contraindications   Exam Type Vaginal   Palpation minor tensions in L obt int. noted lowered position of bladder within introitus    Strength fair squeeze, definite lift  required pillow under hips to elicit a more circumferential    Strength # of reps 10   Strength # of seconds 1  required visual tactile cues for sequential coordination           [facility] Adult PT Treatment/Exercise -  01/17/16 0905      Therapeutic Activites    Therapeutic Activities --  gait training with more hip flexion, midfoot strike      Neuro Re-ed    Neuro Re-ed Details  pelvic floor activation, co-activation with hip IR.ER and deep core level 2      Manual Therapy   Internal Pelvic Floor STM at obt int L, STM with MWM R hip IR/ER with palpation at medial aspect of ischial tuberosity                 PT Education - 01/17/16 0912    Education provided Yes   Education Details HEP   Person(s) Educated Patient   Methods Explanation;Demonstration;Tactile cues;Verbal cues;Handout   Comprehension Returned demonstration;Verbalized understanding             PT Long Term Goals - 01/17/16 0917      PT LONG TERM GOAL #1   Title Pt will decrease her ODI score from 24% to < 20% in order to travel on her trips to [place] and walk long distances on uneven surfaces.  (09/26/15: 12%)    Time 12   Period Weeks   Status Achieved      PT LONG TERM GOAL #2   Title Pt will decrease her PSQI score from 29% to < 24% in order to improve sleep quality and demonstrate ability to lay in a comfortable position.  (1%)   Time 12   Period Weeks   Status Achieved     PT LONG TERM GOAL #3   Title Pt will be complaint with water intake in order to promote bladder health and hydration for muscles for walking.    Time 12   Status Achieved     PT LONG TERM GOAL #4   Title Pt will demo no pelvic asymmetries and increased hip PROM IR bilaterally > 10 deg in order to improve gait mechanics for ambulation.    Time 12   Period Weeks   Status Achieved     PT LONG TERM GOAL #5   Title Pt will demo no pain with sidestepping L with yellow resistive band at thigh across 5 steps in order to perform exercises and to return to walking longer distances and maintain proper alignment of hips and LE.   Time 12   Period Weeks   Status Achieved     Additional Long Term Goals   Additional Long Term Goals Yes     PT LONG TERM GOAL #6   Title Pt will demo IND with HEP   Time 12   Period Weeks   Status Partially Met     PT LONG TERM GOAL #7   Title Pt will demo decreased thoracic/ lumbar/ gluteal mm tensions B and report no reoccurence of pain at R glut and medial to ASIS/inguinal area across 4 weeks in order to demo IND with self-maintenence to m inimize relapse of pain.    Time 12   Period Weeks   Status Achieved     PT LONG TERM GOAL #8   Title Pt will be able to perform hip abd/ER with R heel over L thigh in order to demo less pain and to perform higher functional fitness exercises to minimize relapse of pain   Time 12   Period Weeks   Status --     PT LONG TERM GOAL  #9   TITLE Pt will report to be able lay on her R side to sleep  in order to improve QOL   Time 12   Period Weeks   Status On-going     PT LONG TERM GOAL  #10   TITLE Pt will demo less gait deviations ( heel striking, short stride, decreased hip flexion) across 2  visits without cuing in order to walk with improved mechanics and minimize risks for relapse of pain and to go on foriegn travels.   Time 12   Period Weeks   Status New               Plan - 01/17/16 0913    Clinical Impression Statement Pt reports she felt 75% improvement following manual Tx on the femur/tibial bones from last session. Pt showed significantly decreased pelvic floor mm tensions, increased R hip ER ROM, and increased ability to slide her leg into hip flexion or ER/abduction.  Pt required pelvic floor coordination training with a pillow under hips to elicit a more cranial position to her bladder and a stronger pelvic floor contraction.  Pt showed decreased pain and fascial restrictions over R lateroanterior thigh following cues to pre-activate pelvic floor muscles.  Pt showed improved gait mechanics with training. Pt continues to benefit from skilled PT.    Rehab Potential Good   PT Frequency 1x / week   PT Duration 12 weeks   PT Treatment/Interventions ADLs/Self Care Home Management;Aquatic Therapy;Gait training;Traction;Moist Heat;Stair training;Functional mobility training;Therapeutic activities;Therapeutic exercise;Balance training;Neuromuscular re-education;Electrical Stimulation;Cryotherapy;Manual techniques;Patient/family education;Passive range of motion;Dry needling;Energy conservation      Patient will benefit from skilled therapeutic intervention in order to improve the following deficits and impairments:  Abnormal gait, Pain, Improper body mechanics, Postural dysfunction, Other (comment), Decreased mobility, Decreased coordination, Decreased activity tolerance, Decreased endurance, Decreased balance, Decreased safety awareness, Impaired flexibility, Decreased range of motion, Obesity, Decreased strength, Cardiopulmonary status limiting activity, Difficulty walking, Decreased scar mobility  Visit Diagnosis: Poor posture  Myalgia  Other abnormalities of gait and  mobility      Problem List Patient Active Problem List   Diagnosis Date Noted  . Cholecystitis 08/21/2015  . Medicare annual wellness visit, subsequent 07/24/2015  . Cerebrovascular small vessel disease 04/24/2015  . Benign paroxysmal positional vertigo 04/24/2015  . Pain in joint, pelvic region and thigh 04/24/2015  . Counseling for travel 01/11/2015  . Shoulder pain, right 11/22/2013  . Statin intolerance 10/08/2012  . Edema 05/14/2012  . Generalized anxiety disorder 01/06/2012  . 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 Hypothyroidism 12/18/2010  . Low back pain radiating to both legs 12/18/2010  . Hip pain, right 12/18/2010  . Obesity 05/09/2010  . CAD (coronary artery disease) 05/09/2010  . Hyperlipidemia 05/09/2010  . HTN (hypertension) 05/09/2010    Jerl Mina ,PT, DPT, E-RYT  01/17/2016, 9:48 AM  [place] MAIN [facility] SERVICES [address] [place], [place], [address] Phone: [phone]   Fax:  [phone]  Name: Meagan Cox MRN: [id number] Date of Birth: 01/15/1941

## 2016-01-17 NOTE — Patient Instructions (Signed)
PELVIC FLOOR / KEGEL EXERCISES   Pelvic floor/ Kegel exercises are used to strengthen the muscles in the base of your pelvis that are responsible for supporting your pelvic organs and preventing urine/feces leakage. Based on your therapist's recommendations, they can be performed while standing, sitting, or lying down. Imagine pelvic floor area as a diamond with pelvic landmarks: top =pubic bone, bottom tip=tailbone, sides=sitting bones (ischial tuberosities).    Make yourself aware of this muscle group by using these cues while coordinating your breath:  Inhale, feel pelvic floor diamond area lower like hammock towards your feet and ribcage/belly expanding. Pause. Let the exhale naturally and feel your belly sink, abdominal muscles hugging in around you and you may notice the pelvic diamond draws upward towards your head forming a umbrella shape. Give a squeeze during the exhalation like you are stopping the flow of urine. If you are squeezing the buttock muscles, try to give 50% less effort.   Common Errors:  Breath holding: If you are holding your breath, you may be bearing down against your bladder instead of pulling it up. If you belly bulges up while you are squeezing, you are holding your breath. Be sure to breathe gently in and out while exercising. Counting out loud may help you avoid holding your breath.  Accessory muscle use: You should not see or feel other muscle movement when performing pelvic floor exercises. When done properly, no one can tell that you are performing the exercises. Keep the buttocks, belly and inner thighs relaxed.  Overdoing it: Your muscles can fatigue and stop working for you if you over-exercise. You may actually leak more or feel soreness at the lower abdomen or rectum.  YOUR HOME EXERCISE PROGRAM   SHORT HOLDS: Position: on back with a pillow under hips  Inhale and then exhale. Then squeeze the muscle.  (Be sure to let belly sink in with exhales and not  push outward)  Perform 10 repetitions, 3 Times/day    Incorporate awareness of pelvic floor movement upward on exhale, lowering on inhale  -> into deep core level 2 with pillow under hips.    Practice walking with thigh high to hand and lift feet higher, land midfoot on the ground  55ft x 4                      DECREASE DOWNWARD PRESSURE ON  YOUR PELVIC FLOOR, ABDOMINAL, LOW BACK MUSCLES       PRESERVE YOUR PELVIC HEALTH LONG-TERM   ** SQUEEZE pelvic floor BEFORE YOUR SNEEZE, COUGH, LAUGH   ** EXHALE BEFORE YOU RISE AGAINST GRAVITY (lifting, sit to stand, from squat to stand)   ** LOG ROLL OUT OF BED INSTEAD OF CRUNCH/SIT-UP

## 2016-01-18 ENCOUNTER — Ambulatory Visit (INDEPENDENT_AMBULATORY_CARE_PROVIDER_SITE_OTHER): Payer: PPO | Admitting: Internal Medicine

## 2016-01-18 ENCOUNTER — Other Ambulatory Visit: Payer: Self-pay

## 2016-01-18 ENCOUNTER — Encounter: Payer: Self-pay | Admitting: Internal Medicine

## 2016-01-18 VITALS — BP 122/66 | HR 65 | Temp 97.4°F | Resp 16 | Ht 63.0 in | Wt 210.1 lb

## 2016-01-18 DIAGNOSIS — E7800 Pure hypercholesterolemia, unspecified: Secondary | ICD-10-CM

## 2016-01-18 DIAGNOSIS — Z780 Asymptomatic menopausal state: Secondary | ICD-10-CM

## 2016-01-18 DIAGNOSIS — Z1231 Encounter for screening mammogram for malignant neoplasm of breast: Secondary | ICD-10-CM

## 2016-01-18 DIAGNOSIS — IMO0001 Reserved for inherently not codable concepts without codable children: Secondary | ICD-10-CM

## 2016-01-18 DIAGNOSIS — I2510 Atherosclerotic heart disease of native coronary artery without angina pectoris: Secondary | ICD-10-CM

## 2016-01-18 DIAGNOSIS — E034 Atrophy of thyroid (acquired): Secondary | ICD-10-CM

## 2016-01-18 DIAGNOSIS — E559 Vitamin D deficiency, unspecified: Secondary | ICD-10-CM | POA: Diagnosis not present

## 2016-01-18 DIAGNOSIS — Z6837 Body mass index (BMI) 37.0-37.9, adult: Secondary | ICD-10-CM

## 2016-01-18 DIAGNOSIS — R05 Cough: Secondary | ICD-10-CM

## 2016-01-18 DIAGNOSIS — E6609 Other obesity due to excess calories: Secondary | ICD-10-CM

## 2016-01-18 DIAGNOSIS — Z789 Other specified health status: Secondary | ICD-10-CM

## 2016-01-18 DIAGNOSIS — Z1239 Encounter for other screening for malignant neoplasm of breast: Secondary | ICD-10-CM

## 2016-01-18 DIAGNOSIS — R059 Cough, unspecified: Secondary | ICD-10-CM

## 2016-01-18 LAB — COMPREHENSIVE METABOLIC PANEL
ALBUMIN: 4.1 g/dL (ref 3.5–5.2)
ALT: 11 U/L (ref 0–35)
AST: 15 U/L (ref 0–37)
Alkaline Phosphatase: 60 U/L (ref 39–117)
BUN: 15 mg/dL (ref 6–23)
CHLORIDE: 104 meq/L (ref 96–112)
CO2: 27 meq/L (ref 19–32)
Calcium: 9.1 mg/dL (ref 8.4–10.5)
Creatinine, Ser: 0.66 mg/dL (ref 0.40–1.20)
GFR: 93.06 mL/min (ref >60.00)
GLUCOSE: 91 mg/dL (ref 70–99)
POTASSIUM: 5.2 meq/L — AB (ref 3.5–5.1)
SODIUM: 140 meq/L (ref 135–145)
TOTAL PROTEIN: 6.4 g/dL (ref 6.0–8.3)
Total Bilirubin: 0.5 mg/dL (ref 0.2–1.2)

## 2016-01-18 LAB — LIPID PANEL
CHOLESTEROL: 242 mg/dL — AB (ref 0–200)
HDL: 59.8 mg/dL (ref >39.00)
LDL CALC: 150 mg/dL — AB (ref 0–99)
NonHDL: 182.65
TRIGLYCERIDES: 163 mg/dL — AB (ref 0.0–149.0)
Total CHOL/HDL Ratio: 4
VLDL: 32.6 mg/dL (ref 0.0–40.0)

## 2016-01-18 LAB — TSH: TSH: 3.18 u[IU]/mL (ref 0.35–4.50)

## 2016-01-18 LAB — VITAMIN D 25 HYDROXY (VIT D DEFICIENCY, FRACTURES): VITD: 30.77 ng/mL (ref 30.00–100.00)

## 2016-01-18 MED ORDER — TETANUS-DIPHTH-ACELL PERTUSSIS 5-2.5-18.5 LF-MCG/0.5 IM SUSP: 0.5000 mL | Freq: Once | INTRAMUSCULAR | 0 refills | Status: AC

## 2016-01-18 MED ORDER — BENZONATATE 100 MG PO CAPS: 200.0000 mg | ORAL_CAPSULE | Freq: Three times a day (TID) | ORAL | 2 refills | Status: DC

## 2016-01-18 NOTE — Progress Notes (Signed)
Pre-visit discussion using our clinic review tool. No additional management support is needed unless otherwise documented below in the visit note.  

## 2016-01-18 NOTE — Patient Instructions (Addendum)
I WILL PRESCRIBE A CHOLESTEROL MEDICATION ONCE I SEE YOUR CURRENT LIPID PANEL IF YOUR RISK IS > 10%

## 2016-01-18 NOTE — Assessment & Plan Note (Addendum)
Using the Framingham risk calculator, her  10 year risk of coronary artery disease events is 11%.She has been taking RED YEAST RICE  INTERMITTENTLY, Will recommend  simvastatin  Lab Results  Component Value Date   CHOL 242 (H) 01/18/2016   HDL 59.80 01/18/2016   LDLCALC 150 (H) 01/18/2016   LDLDIRECT 146.0 07/18/2015   TRIG 163.0 (H) 01/18/2016   CHOLHDL 4 01/18/2016

## 2016-01-18 NOTE — Progress Notes (Signed)
Subjective:  Patient ID: Meagan Cox, female    DOB: 15-Sep-1940  Age: 75 y.o. MRN: [id number]  CC: The primary encounter diagnosis was Coronary artery disease involving native heart without angina pectoris, unspecified vessel or lesion type. Diagnoses of Hypothyroidism due to acquired atrophy of thyroid, Pure hypercholesterolemia, Vitamin D deficiency, Cough, Breast cancer screening, Postmenopausal estrogen deficiency, Statin intolerance, and Class 2 obesity due to excess calories with serious comorbidity and body mass index (BMI) of 37.0 to 37.9 in adult were also pertinent to this visit.  HPI Meagan Cox presents for follow up on multiple issues,  including obesity and atherosclerosis .she has been eating smaller, more frequent meals, since her daughter Rolene Arbour had bariatric surgery,  But patient has gained 1 lb  since her visit in June    DAUGHTERS KAREN GREGORY AND KELLY LAMBERT    She underwent open cholecystectomy in July,  No complications..  Has resumed walking for exercise.  Still benefitting from PT for back pain,  No back pain   Overdue for mammogram and colonoscopy and DEXA  HAD COLOGUARD,  WAS NORMAL.  TAKING RED YEAST RICE INTERMITTENTLY    Outpatient Medications Prior to Visit  Medication Sig Dispense Refill  . diclofenac (CATAFLAM) 50 MG tablet TAKE ONE TABLET BY MOUTH TWICE DAILY 180 tablet 1  . estradiol (ESTRACE) 2 MG tablet TAKE ONE TABLET BY MOUTH ONCE DAILY 90 tablet 3  . Ivermectin 1 % CREA Apply 1 application topically daily. 30 g 0  . levothyroxine (SYNTHROID, LEVOTHROID) 125 MCG tablet TAKE ONE TABLET BY MOUTH ONCE DAILY 90 tablet 3  . Multiple Vitamin (MULTIVITAMIN) tablet Take 1 tablet by mouth daily.      . Red Yeast Rice 600 MG CAPS Take 1 capsule (600 mg total) by mouth 2 (two) times daily. 60 capsule 11  . ALPRAZolam (XANAX) 0.5 MG tablet Take 1 tablet (0.5 mg total) by mouth at bedtime as needed. (Patient not taking: Reported on  01/18/2016) 30 tablet 3  . benzonatate (TESSALON) 100 MG capsule Take 2 capsules (200 mg total) by mouth 2 (two) times daily as needed for cough. (Patient not taking: Reported on 01/18/2016) 60 capsule 0  . Tdap (BOOSTRIX) 5-2.5-18.5 LF-MCG/0.5 injection Inject 0.5 mLs into the muscle once. (Patient not taking: Reported on 01/18/2016) 0.5 mL 0   No facility-administered medications prior to visit.     Review of Systems;  Patient denies headache, fevers, malaise, unintentional weight loss, skin rash, eye pain, sinus congestion and sinus pain, sore throat, dysphagia,  hemoptysis , cough, dyspnea, wheezing, chest pain, palpitations, orthopnea, edema, abdominal pain, nausea, melena, diarrhea, constipation, flank pain, dysuria, hematuria, urinary  Frequency, nocturia, numbness, tingling, seizures,  Focal weakness, Loss of consciousness,  Tremor, insomnia, depression, anxiety, and suicidal ideation.      Objective:  BP 122/66   Pulse 65   Temp 97.4 F (36.3 C) (Oral)   Resp 16   Ht 5\' 3"  (1.6 m)   Wt 210 lb 2 oz (95.3 kg)   SpO2 93%   BMI 37.22 kg/m   BP Readings from Last 3 Encounters:  01/18/16 122/66  01/09/16 138/80  08/22/15 (!) 174/70    Wt Readings from Last 3 Encounters:  01/18/16 210 lb 2 oz (95.3 kg)  01/09/16 206 lb (93.4 kg)  08/21/15 212 lb 3.2 oz (96.3 kg)    General appearance: alert, cooperative and appears stated age Ears: normal TM's and external ear canals both ears Throat: lips, mucosa,  and tongue normal; teeth and gums normal Neck: no adenopathy, no carotid bruit, supple, symmetrical, trachea midline and thyroid not enlarged, symmetric, no tenderness/mass/nodules Back: symmetric, no curvature. ROM normal. No CVA tenderness. Lungs: clear to auscultation bilaterally Heart: regular rate and rhythm, S1, S2 normal, no murmur, click, rub or gallop Abdomen: soft, non-tender; bowel sounds normal; no masses,  no organomegaly Pulses: 2+ and symmetric Skin: Skin  color, texture, turgor normal. No rashes or lesions Lymph nodes: Cervical, supraclavicular, and axillary nodes normal.  No results found for: HGBA1C  Lab Results  Component Value Date   CREATININE 0.66 01/18/2016   CREATININE 0.56 08/21/2015   CREATININE 0.64 07/18/2015    Lab Results  Component Value Date   WBC 10.9 08/21/2015   HGB 15.4 08/21/2015   HCT 44.4 08/21/2015   PLT 226 08/21/2015   GLUCOSE 91 01/18/2016   CHOL 242 (H) 01/18/2016   TRIG 163.0 (H) 01/18/2016   HDL 59.80 01/18/2016   LDLDIRECT 146.0 07/18/2015   LDLCALC 150 (H) 01/18/2016   ALT 11 01/18/2016   AST 15 01/18/2016   NA 140 01/18/2016   K 5.2 (H) 01/18/2016   CL 104 01/18/2016   CREATININE 0.66 01/18/2016   BUN 15 01/18/2016   CO2 27 01/18/2016   TSH 3.18 01/18/2016    Mm Outside Films Mammo  Result Date: 09/20/2015 This examination belongs to an outside facility and is stored here for comparison purposes only.  Contact the originating outside institution for any associated report or interpretation.   Assessment & Plan:   Problem List Items Addressed This Visit    CAD (coronary artery disease) - Primary    No recent stress testing per patient preference.  Goal LDL is 70.  She is willing to start aspirin and statin   Lab Results  Component Value Date   CHOL 242 (H) 01/18/2016   HDL 59.80 01/18/2016   LDLCALC 150 (H) 01/18/2016   LDLDIRECT 146.0 07/18/2015   TRIG 163.0 (H) 01/18/2016   CHOLHDL 4 01/18/2016         Relevant Medications   simvastatin (ZOCOR) 20 MG tablet   Other Relevant Orders   Lipid panel (Completed)   Hyperlipidemia    Using the Framingham risk calculator, her  10 year risk of coronary artery disease events is 11%.She has been taking RED YEAST RICE  INTERMITTENTLY, Will recommend  simvastatin  Lab Results  Component Value Date   CHOL 242 (H) 01/18/2016   HDL 59.80 01/18/2016   LDLCALC 150 (H) 01/18/2016   LDLDIRECT 146.0 07/18/2015   TRIG 163.0 (H) 01/18/2016    CHOLHDL 4 01/18/2016         Relevant Medications   simvastatin (ZOCOR) 20 MG tablet   Other Relevant Orders   Lipid panel (Completed)   Comprehensive metabolic panel (Completed)   Hypothyroidism   Relevant Orders   TSH (Completed)   Obesity    She weight 248 lbs in 2013 . bmi was 75. I have congratulated her in making lifestyle changes and encouraged reduction of BMI with goal weight loss of f 10% of body weight or 21 lbs  over the next 6 months using a low glycemic index diet and regular exercise a minimum of 5 days per week.        Statin intolerance    She is willing to consider another trial of statin therapy if her risk is not reduced with RYR       Other Visit Diagnoses  Vitamin D deficiency       Relevant Orders   VITAMIN D 25 Hydroxy (Vit-D Deficiency, Fractures) (Completed)   DG Bone Density   Cough       Relevant Medications   benzonatate (TESSALON) 100 MG capsule   Breast cancer screening       Relevant Orders   MM SCREENING BREAST TOMO BILATERAL   Postmenopausal estrogen deficiency          I have changed Ms. Sirois's benzonatate. I am also having her start on simvastatin. Additionally, I am having her maintain her multivitamin, ALPRAZolam, Ivermectin, estradiol, levothyroxine, diclofenac, and Red Yeast Rice.  Meds ordered this encounter  Medications  . Tdap (BOOSTRIX) 5-2.5-18.5 LF-MCG/0.5 injection    Sig: Inject 0.5 mLs into the muscle once.    Dispense:  0.5 mL    Refill:  0  . benzonatate (TESSALON) 100 MG capsule    Sig: Take 2 capsules (200 mg total) by mouth 3 (three) times daily.    Dispense:  60 capsule    Refill:  2  . simvastatin (ZOCOR) 20 MG tablet    Sig: Take 1 tablet (20 mg total) by mouth at bedtime.    Dispense:  90 tablet    Refill:  3    Medications Discontinued During This Encounter  Medication Reason  . Tdap (BOOSTRIX) 5-2.5-18.5 LF-MCG/0.5 injection Reorder  . benzonatate (TESSALON) 100 MG capsule Reorder     Follow-up: No Follow-up on file.   Crecencio Mc, MD

## 2016-01-20 ENCOUNTER — Encounter: Payer: Self-pay | Admitting: Internal Medicine

## 2016-01-21 MED ORDER — SIMVASTATIN 20 MG PO TABS: 20.0000 mg | ORAL_TABLET | Freq: Every day | ORAL | 3 refills | Status: DC

## 2016-01-21 NOTE — Assessment & Plan Note (Signed)
She is willing to consider another trial of statin therapy if her risk is not reduced with RYR

## 2016-01-21 NOTE — Assessment & Plan Note (Addendum)
She weight 248 lbs in 2013 . bmi was 32. I have congratulated her in making lifestyle changes and encouraged reduction of BMI with goal weight loss of f 10% of body weight or 21 lbs  over the next 6 months using a low glycemic index diet and regular exercise a minimum of 5 days per week.

## 2016-01-21 NOTE — Assessment & Plan Note (Signed)
No recent stress testing per patient preference.  Goal LDL is 70.  She is willing to start aspirin and statin   Lab Results  Component Value Date   CHOL 242 (H) 01/18/2016   HDL 59.80 01/18/2016   LDLCALC 150 (H) 01/18/2016   LDLDIRECT 146.0 07/18/2015   TRIG 163.0 (H) 01/18/2016   CHOLHDL 4 01/18/2016

## 2016-01-24 ENCOUNTER — Ambulatory Visit: Payer: PPO | Admitting: Physical Therapy

## 2016-02-29 ENCOUNTER — Ambulatory Visit: Payer: PPO | Attending: Internal Medicine | Admitting: Physical Therapy

## 2016-02-29 DIAGNOSIS — M791 Myalgia, unspecified site: Secondary | ICD-10-CM

## 2016-02-29 DIAGNOSIS — R2689 Other abnormalities of gait and mobility: Secondary | ICD-10-CM | POA: Diagnosis not present

## 2016-02-29 DIAGNOSIS — R293 Abnormal posture: Secondary | ICD-10-CM | POA: Diagnosis not present

## 2016-02-29 NOTE — Therapy (Signed)
[place] MAIN [facility] SERVICES [address] [place], [place], [address] Phone: [phone]   Fax:  [phone]  Physical Therapy Treatment  Patient Details  Name: Meagan Cox MRN: [id number] Date of Birth: October 17, 1940 Referring Provider: Derrel Nip MD  Encounter Date: 02/29/2016      PT End of Session - 02/29/16 2152    Visit Number 27   Date for PT Re-Evaluation 04/16/16   Authorization Type 7/10   PT Start Time 1602   PT Stop Time 1700   PT Time Calculation (min) 58 min   Activity Tolerance Patient tolerated treatment well;No increased pain   Behavior During Therapy WFL for tasks assessed/performed      Past Medical History:  Diagnosis Date  . Hyperlipidemia   . Hypertension   . Hypothyroidism     Past Surgical History:  Procedure Laterality Date  . ABDOMINAL HYSTERECTOMY    . CARDIAC CATHETERIZATION     30 years ago, [place]  . CHOLECYSTECTOMY N/A 08/21/2015   Procedure: LAPAROSCOPIC CHOLECYSTECTOMY;  Surgeon: Jules Husbands, MD;  Location: [facility] ORS;  Service: General;  Laterality: N/A;  . PARTIAL HYSTERECTOMY    . TONSILLECTOMY      There were no vitals filed for this visit.      Subjective Assessment - 02/29/16 1601    Subjective Pt returns to PT after 1 month. Pt reports she has been doing alright. Her R leg has improved 65-75% better .   The remaining issues include: 1-2 x / week she notices the area over the R groin catches but it no longer takes her "cuff". Pt is not able to lay on her R hip.    Pertinent History R medial thigh pain: Pt has trouble sleeping  on L side due to L arm pain  and tries to sleep on her back and belly.  Pt has difficulty sleeping on R  side due to R hip bursitis. Hx of R knee issues (meniscus "slipping out out place" and chriporactor helps to realign). Resolved for the past 1-2 years.   Gynecological: 3 vaginal birth,  without perineal trauma.  SUI . Denied saddle signs.  Pt travels to  [place] 2x a year. Pt will be leaving in August for her next trip.              [facility] PT Assessment - 02/29/16 1606      Observation/Other Assessments   Observations no genu valgus with sit to stand and walking,  slight pronation of feet in walking,      AROM   AROM Assessment Site --  hip ext standing ~20 deg on R but w/ "pulling in front thigh     PROM   Overall PROM Comments hip ER/abd, heel above L knee      Strength   Overall Strength Comments PF 8 reps B      Palpation   Palpation comment minor fascial restrictions along R inguinal, R sacrotuberous ligament path, R pubic tubercle           Pelvic floor assessment: no pelvic floor mm tensions. Able to elicit a grade 3/5 contraction across 3 sec for 3 reps.           [place] [facility] Adult PT Treatment/Exercise - 02/29/16 2151      Neuro Re-ed    Neuro Re-ed Details  see pt instructions     Manual Therapy   Manual therapy comments STM and AP mobs along R lateral  edge of sacrum, fascial releases along R groin                      PT Long Term Goals - 01/17/16 0917      PT LONG TERM GOAL #1   Title Pt will decrease her ODI score from 24% to < 20% in order to travel on her trips to [place] and walk long distances on uneven surfaces.  (09/26/15: 12%)    Time 12   Period Weeks   Status Achieved     PT LONG TERM GOAL #2   Title Pt will decrease her PSQI score from 29% to < 24% in order to improve sleep quality and demonstrate ability to lay in a comfortable position.  (1%)   Time 12   Period Weeks   Status Achieved     PT LONG TERM GOAL #3   Title Pt will be complaint with water intake in order to promote bladder health and hydration for muscles for walking.    Time 12   Status Achieved     PT LONG TERM GOAL #4   Title Pt will demo no pelvic asymmetries and increased hip PROM IR bilaterally > 10 deg in order to improve gait mechanics for ambulation.    Time 12   Period Weeks   Status Achieved      PT LONG TERM GOAL #5   Title Pt will demo no pain with sidestepping L with yellow resistive band at thigh across 5 steps in order to perform exercises and to return to walking longer distances and maintain proper alignment of hips and LE.   Time 12   Period Weeks   Status Achieved     Additional Long Term Goals   Additional Long Term Goals Yes     PT LONG TERM GOAL #6   Title Pt will demo IND with HEP   Time 12   Period Weeks   Status Partially Met     PT LONG TERM GOAL #7   Title Pt will demo decreased thoracic/ lumbar/ gluteal mm tensions B and report no reoccurence of pain at R glut and medial to ASIS/inguinal area across 4 weeks in order to demo IND with self-maintenence to m inimize relapse of pain.    Time 12   Period Weeks   Status Achieved     PT LONG TERM GOAL #8   Title Pt will be able to perform hip abd/ER with R heel over L thigh in order to demo less pain and to perform higher functional fitness exercises to minimize relapse of pain   Time 12   Period Weeks   Status --     PT LONG TERM GOAL  #9   TITLE Pt will report to be able lay on her R side to sleep in order to improve QOL   Time 12   Period Weeks   Status On-going     PT LONG TERM GOAL  #10   TITLE Pt will demo less gait deviations ( heel striking, short stride, decreased hip flexion) across 2 visits without cuing in order to walk with improved mechanics and minimize risks for relapse of pain and to go on foriegn travels.   Time 12   Period Weeks   Status New               Plan - 02/29/16 2152    Clinical Impression Statement Pt reports 60-75% improvement across the past month.  Pt showed significantly increased hip ER/abd on RLE compared to last session a month ago. Pt also demo'd increased functional strength with no genu valgus in sit to stand t/f and also improved gait mechanics. Today following Tx, pt demo'd less pulling sensation in the R groin area and increased hip ext in standing.  Posterior/lateral complaints were relieved with  manual Tx along R lateral edge of sacrum which promoted improved sacroiliac arthrokinematics. Pt progressed to pelvic floor strengthening 3 reps, 3 sec hold with proper pelvic floor lengthening.  Suspect pt will continue to progress towards her goals with continuation of intrinsic feet mm strengthening and pelvic floor strengthening.  Educated pt on the possible benefit to get an Xray/ MRI but pt declined.        Patient will benefit from skilled therapeutic intervention in order to improve the following deficits and impairments:     Visit Diagnosis: Myalgia  Poor posture  Other abnormalities of gait and mobility     Problem List Patient Active Problem List   Diagnosis Date Noted  . Cholecystitis 08/21/2015  . Medicare annual wellness visit, subsequent 07/24/2015  . Cerebrovascular small vessel disease 04/24/2015  . Benign paroxysmal positional vertigo 04/24/2015  . Pain in joint, pelvic region and thigh 04/24/2015  . Counseling for travel 01/11/2015  . Shoulder pain, right 11/22/2013  . Statin intolerance 10/08/2012  . Edema 05/14/2012  . Generalized anxiety disorder 01/06/2012  . 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 Hypothyroidism 12/18/2010  . Low back pain radiating to both legs 12/18/2010  . Hip pain, right 12/18/2010  . Obesity 05/09/2010  . CAD (coronary artery disease) 05/09/2010  . Hyperlipidemia 05/09/2010    Jerl Mina 02/29/2016, 9:57 PM  [place] MAIN [facility] SERVICES [address] [place], [place], [address] Phone: [phone]   Fax:  [phone]  Name: Lenise Jr MRN: [id number] Date of Birth: 07-28-1940

## 2016-02-29 NOTE — Patient Instructions (Signed)
Bridges  Use shoulders and hands and feet to stabilize, exhale as you lift hips, pillow between knees to maintain alignment  10 reps x 3 / 2 x day    _________   Side sitting on bed, pillow folded twice under R leg,  Prop on elbows/ forearm, push through fist and forearm, elbow slight ahead of shoulder Kick R feet back to bring thigh back about ~10 deg.   10 x 2-3/ 2 x day   _________  Heel raises  Both feet  Spread toes, push through ballmounds 10 x 3  / 2 x day

## 2016-03-05 ENCOUNTER — Ambulatory Visit: Payer: PPO | Attending: Internal Medicine | Admitting: Physical Therapy

## 2016-03-05 ENCOUNTER — Other Ambulatory Visit: Payer: Self-pay | Admitting: Internal Medicine

## 2016-03-05 DIAGNOSIS — R2689 Other abnormalities of gait and mobility: Secondary | ICD-10-CM | POA: Diagnosis not present

## 2016-03-05 DIAGNOSIS — R293 Abnormal posture: Secondary | ICD-10-CM | POA: Diagnosis not present

## 2016-03-05 DIAGNOSIS — M791 Myalgia, unspecified site: Secondary | ICD-10-CM

## 2016-03-05 NOTE — Telephone Encounter (Signed)
Appt needed for labs and F/u

## 2016-03-06 NOTE — Patient Instructions (Signed)
Heel slide along L tibia in seated position 10 reps (progression from supine)

## 2016-03-06 NOTE — Therapy (Signed)
[place] MAIN [facility] SERVICES [address] [place], [place], [address] Phone: [phone]   Fax:  [phone]  Physical Therapy Treatment  Patient Details  Name: Meagan Cox MRN: [id number] Date of Birth: 1940-03-23 Referring Provider: Derrel Nip MD  Encounter Date: 03/05/2016      PT End of Session - 03/06/16 2225    Visit Number 28   Date for PT Re-Evaluation 04/16/16   Authorization Type 8/10   PT Start Time 1600   PT Stop Time 1700   PT Time Calculation (min) 60 min      Past Medical History:  Diagnosis Date  . Hyperlipidemia   . Hypertension   . Hypothyroidism     Past Surgical History:  Procedure Laterality Date  . ABDOMINAL HYSTERECTOMY    . CARDIAC CATHETERIZATION     30 years ago, [place]  . CHOLECYSTECTOMY N/A 08/21/2015   Procedure: LAPAROSCOPIC CHOLECYSTECTOMY;  Surgeon: Jules Husbands, MD;  Location: [facility] ORS;  Service: General;  Laterality: N/A;  . PARTIAL HYSTERECTOMY    . TONSILLECTOMY      There were no vitals filed for this visit.      Subjective Assessment - 03/05/16 1609    Subjective Pt reports her anterior tightness is now softer and feels looser. The remaining issues are soreness in the R glut and distal thigh. Pt had not had any catching episodes in the R hip since 4 days ago when she came to her PT session.    Pertinent History R medial thigh pain: Pt has trouble sleeping  on L side due to L arm pain  and tries to sleep on her back and belly.  Pt has difficulty sleeping on R  side due to R hip bursitis. Hx of R knee issues (meniscus "slipping out out place" and chriporactor helps to realign). Resolved for the past 1-2 years.   Gynecological: 3 vaginal birth,  without perineal trauma.  SUI . Denied saddle signs.  Pt travels to [place] 2x a year. Pt will be leaving in August for her next trip.              [facility] PT Assessment - 03/06/16 2220      AROM   AROM Assessment Site --  pre Tx: limited  ROM hip ER/abd R, post Tx: heel above L knee     Palpation   Palpation comment fascial tensions at distal vasteral medialis on R , increased tensions along R coccygeus and attachments along ischial tuberosity/ ischial ramus                      [facility] Adult PT Treatment/Exercise - 03/06/16 2222      Therapeutic Activites    Therapeutic Activities --  see pt instructions     Manual Therapy   Manual Therapy --  long axis distraction RLE, MWM with STM at ischial tub, isch                PT Education - 03/06/16 2224    Education provided Yes   Education Details HEP   Person(s) Educated Patient   Methods Explanation;Demonstration;Tactile cues;Verbal cues   Comprehension Verbalized understanding;Returned demonstration             PT Long Term Goals - 01/17/16 0917      PT LONG TERM GOAL #1   Title Pt will decrease her ODI score from 24% to < 20% in order to travel on her  trips to [place] and walk long distances on uneven surfaces.  (09/26/15: 12%)    Time 12   Period Weeks   Status Achieved     PT LONG TERM GOAL #2   Title Pt will decrease her PSQI score from 29% to < 24% in order to improve sleep quality and demonstrate ability to lay in a comfortable position.  (1%)   Time 12   Period Weeks   Status Achieved     PT LONG TERM GOAL #3   Title Pt will be complaint with water intake in order to promote bladder health and hydration for muscles for walking.    Time 12   Status Achieved     PT LONG TERM GOAL #4   Title Pt will demo no pelvic asymmetries and increased hip PROM IR bilaterally > 10 deg in order to improve gait mechanics for ambulation.    Time 12   Period Weeks   Status Achieved     PT LONG TERM GOAL #5   Title Pt will demo no pain with sidestepping L with yellow resistive band at thigh across 5 steps in order to perform exercises and to return to walking longer distances and maintain proper alignment of hips and LE.   Time 12   Period  Weeks   Status Achieved     Additional Long Term Goals   Additional Long Term Goals Yes     PT LONG TERM GOAL #6   Title Pt will demo IND with HEP   Time 12   Period Weeks   Status Partially Met     PT LONG TERM GOAL #7   Title Pt will demo decreased thoracic/ lumbar/ gluteal mm tensions B and report no reoccurence of pain at R glut and medial to ASIS/inguinal area across 4 weeks in order to demo IND with self-maintenence to m inimize relapse of pain.    Time 12   Period Weeks   Status Achieved     PT LONG TERM GOAL #8   Title Pt will be able to perform hip abd/ER with R heel over L thigh in order to demo less pain and to perform higher functional fitness exercises to minimize relapse of pain   Time 12   Period Weeks   Status --     PT LONG TERM GOAL  #9   TITLE Pt will report to be able lay on her R side to sleep in order to improve QOL   Time 12   Period Weeks   Status On-going     PT LONG TERM GOAL  #10   TITLE Pt will demo less gait deviations ( heel striking, short stride, decreased hip flexion) across 2 visits without cuing in order to walk with improved mechanics and minimize risks for relapse of pain and to go on foriegn travels.   Time 12   Period Weeks   Status New               Plan - 03/06/16 2225    Clinical Impression Statement Pt achieved increased R hip ER/abd in a seated position following Tx.  Tx was focused on releasing increased tensions along R coccygeus and STM attachments at ischial tuberosity and ischial ramus. Pt continues to benefit from skilled PT.    Rehab Potential Good   PT Frequency 1x / week   PT Duration 12 weeks   PT Treatment/Interventions ADLs/Self Care Home Management;Aquatic Therapy;Gait training;Traction;Moist Heat;Stair training;Functional mobility training;Therapeutic activities;Therapeutic exercise;Balance   training;Neuromuscular re-education;Electrical Stimulation;Cryotherapy;Manual techniques;Patient/family education;Passive  range of motion;Dry needling;Energy conservation   Consulted and Agree with Plan of Care Patient      Patient will benefit from skilled therapeutic intervention in order to improve the following deficits and impairments:  Abnormal gait, Pain, Improper body mechanics, Postural dysfunction, Other (comment), Decreased mobility, Decreased coordination, Decreased activity tolerance, Decreased endurance, Decreased balance, Decreased safety awareness, Impaired flexibility, Decreased range of motion, Obesity, Decreased strength, Cardiopulmonary status limiting activity, Difficulty walking, Decreased scar mobility  Visit Diagnosis: Myalgia  Poor posture  Other abnormalities of gait and mobility     Problem List Patient Active Problem List   Diagnosis Date Noted  . Cholecystitis 08/21/2015  . Medicare annual wellness visit, subsequent 07/24/2015  . Cerebrovascular small vessel disease 04/24/2015  . Benign paroxysmal positional vertigo 04/24/2015  . Pain in joint, pelvic region and thigh 04/24/2015  . Counseling for travel 01/11/2015  . Shoulder pain, right 11/22/2013  . Statin intolerance 10/08/2012  . Edema 05/14/2012  . Generalized anxiety disorder 01/06/2012  . 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 Hypothyroidism 12/18/2010  . Low back pain radiating to both legs 12/18/2010  . Hip pain, right 12/18/2010  . Obesity 05/09/2010  . CAD (coronary artery disease) 05/09/2010  . Hyperlipidemia 05/09/2010    Jerl Mina ,PT, DPT, E-RYT  03/06/2016, 10:26 PM  [place] MAIN [facility] SERVICES [address] [place], [place], [address] Phone: [phone]   Fax:  [phone]  Name: Meagan Cox MRN: [id number] Date of Birth: November 06, 1940

## 2016-03-14 ENCOUNTER — Ambulatory Visit: Payer: PPO | Admitting: Physical Therapy

## 2016-03-14 DIAGNOSIS — R2689 Other abnormalities of gait and mobility: Secondary | ICD-10-CM

## 2016-03-14 DIAGNOSIS — M791 Myalgia, unspecified site: Secondary | ICD-10-CM

## 2016-03-14 DIAGNOSIS — R293 Abnormal posture: Secondary | ICD-10-CM

## 2016-03-15 NOTE — Therapy (Addendum)
[place] MAIN [facility] SERVICES [address] [place], [place], [address] Phone: [phone]   Fax:  [phone]  Physical Therapy Treatment  Patient Details  Name: Meagan Cox MRN: [id number] Date of Birth: 25-Feb-1940 Referring Provider: Derrel Nip MD  Encounter Date: 03/14/2016      PT End of Session - 03/15/16 2137    Visit Number 29   Date for PT Re-Evaluation 04/16/16   Authorization Type 9/10   PT Start Time 1194   PT Stop Time 1730   PT Time Calculation (min) 75 min   Activity Tolerance Patient tolerated treatment well;No increased pain   Behavior During Therapy WFL for tasks assessed/performed      Past Medical History:  Diagnosis Date  . Hyperlipidemia   . Hypertension   . Hypothyroidism     Past Surgical History:  Procedure Laterality Date  . ABDOMINAL HYSTERECTOMY    . CARDIAC CATHETERIZATION     30 years ago,   . CHOLECYSTECTOMY N/A 08/21/2015   Procedure: LAPAROSCOPIC CHOLECYSTECTOMY;  Surgeon: Jules Husbands, MD;  Location: [facility] ORS;  Service: General;  Laterality: N/A;  . PARTIAL HYSTERECTOMY    . TONSILLECTOMY      There were no vitals filed for this visit.      Subjective Assessment - 03/15/16 2127    Subjective Pt reports she is able to lift her R ankle over  her L thigh without pulling on her pants. Pt feels improvement but notices the tightness now lies from groin along the anterior thigh to above the thigh   Pertinent History R medial thigh pain: Pt has trouble sleeping  on L side due to L arm pain  and tries to sleep on her back and belly.  Pt has difficulty sleeping on R  side due to R hip bursitis. Hx of R knee issues (meniscus "slipping out out place" and chriporactor helps to realign). Resolved for the past 1-2 years.   Gynecological: 3 vaginal birth,  without perineal trauma.  SUI . Denied saddle signs.  Pt travels to [place] 2x a year. Pt will be leaving in August for her next trip.               [facility] PT Assessment - 03/15/16 2129      Single Leg Stance   Comments 3 way hip ext: "catching" of RLE when pt did not have increased weight bearing in LLE (SLS side)       AROM   Overall AROM Comments able to tolerate hip ext, ankle hanging off table but has pain with lift the leg up and with adduction in supine. Observed overactivation of rectus  femoris and underactivation of posterior mm      Strength   Overall Strength Comments can tolerate hip abd isometrics                     [facility] Adult PT Treatment/Exercise - 03/15/16 2131      Therapeutic Activites    Therapeutic Activities --  see pt instructions     Manual Therapy   Manual therapy comments STM along proximal and distal attachment of rectus femoris                      PT Long Term Goals - 01/17/16 0917      PT LONG TERM GOAL #1   Title Pt will decrease her ODI score from 24% to < 20% in order  to travel on her trips to [place] and walk long distances on uneven surfaces.  (09/26/15: 12%)    Time 12   Period Weeks   Status Achieved     PT LONG TERM GOAL #2   Title Pt will decrease her PSQI score from 29% to < 24% in order to improve sleep quality and demonstrate ability to lay in a comfortable position.  (1%)   Time 12   Period Weeks   Status Achieved     PT LONG TERM GOAL #3   Title Pt will be complaint with water intake in order to promote bladder health and hydration for muscles for walking.    Time 12   Status Achieved     PT LONG TERM GOAL #4   Title Pt will demo no pelvic asymmetries and increased hip PROM IR bilaterally > 10 deg in order to improve gait mechanics for ambulation.    Time 12   Period Weeks   Status Achieved     PT LONG TERM GOAL #5   Title Pt will demo no pain with sidestepping L with yellow resistive band at thigh across 5 steps in order to perform exercises and to return to walking longer distances and maintain proper alignment of hips and LE.    Time 12   Period Weeks   Status Achieved     Additional Long Term Goals   Additional Long Term Goals Yes     PT LONG TERM GOAL #6   Title Pt will demo IND with HEP   Time 12   Period Weeks   Status Partially Met     PT LONG TERM GOAL #7   Title Pt will demo decreased thoracic/ lumbar/ gluteal mm tensions B and report no reoccurence of pain at R glut and medial to ASIS/inguinal area across 4 weeks in order to demo IND with self-maintenence to m inimize relapse of pain.    Time 12   Period Weeks   Status Achieved     PT LONG TERM GOAL #8   Title Pt will be able to perform hip abd/ER with R heel over L thigh in order to demo less pain and to perform higher functional fitness exercises to minimize relapse of pain   Time 12   Period Weeks   Status --     PT LONG TERM GOAL  #9   TITLE Pt will report to be able lay on her R side to sleep in order to improve QOL   Time 12   Period Weeks   Status On-going     PT LONG TERM GOAL  #10   TITLE Pt will demo less gait deviations ( heel striking, short stride, decreased hip flexion) across 2 visits without cuing in order to walk with improved mechanics and minimize risks for relapse of pain and to go on foriegn travels.   Time 12   Period Weeks   Status New               Plan - 03/15/16 2138    Clinical Impression Statement Pt demo'd hip extension and hip ER/abd ROM in RLE compared to last session. Pt demo'd decreased adductor tensions, remaining tensions now is located at rectus femoris both attachments sites. Pt reported less tenderness and showed less tensions post-Tx. To minimize "catching" and pain, pt required neuromuscular reeducation to activate glut med/ vastus  lateralis with hip abduction of RLE in supine and increased LLE weight bearing with R hip  extension in stance. Pt also demo'd abdominal straining with hip flexion in supine. Plan to address pelvic floor and advance glut lateral LE kinetic chain strengthening.  Pt  continues to benefit skilled PT.       Rehab Potential Good   PT Frequency 1x / week   PT Duration 12 weeks   PT Treatment/Interventions ADLs/Self Care Home Management;Aquatic Therapy;Gait training;Traction;Moist Heat;Stair training;Functional mobility training;Therapeutic activities;Therapeutic exercise;Balance training;Neuromuscular re-education;Electrical Stimulation;Cryotherapy;Manual techniques;Patient/family education;Passive range of motion;Dry needling;Energy conservation   Consulted and Agree with Plan of Care Patient      Patient will benefit from skilled therapeutic intervention in order to improve the following deficits and impairments:  Abnormal gait, Pain, Improper body mechanics, Postural dysfunction, Other (comment), Decreased mobility, Decreased coordination, Decreased activity tolerance, Decreased endurance, Decreased balance, Decreased safety awareness, Impaired flexibility, Decreased range of motion, Obesity, Decreased strength, Cardiopulmonary status limiting activity, Difficulty walking, Decreased scar mobility  Visit Diagnosis: Myalgia  Poor posture  Other abnormalities of gait and mobility     Problem List Patient Active Problem List   Diagnosis Date Noted  . Cholecystitis 08/21/2015  . Medicare annual wellness visit, subsequent 07/24/2015  . Cerebrovascular small vessel disease 04/24/2015  . Benign paroxysmal positional vertigo 04/24/2015  . Pain in joint, pelvic region and thigh 04/24/2015  . Counseling for travel 01/11/2015  . Shoulder pain, right 11/22/2013  . Statin intolerance 10/08/2012  . Edema 05/14/2012  . Generalized anxiety disorder 01/06/2012  . 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 Hypothyroidism 12/18/2010  . Low back pain radiating to both legs 12/18/2010  . Hip pain, right 12/18/2010  . Obesity 05/09/2010  . CAD (coronary artery disease) 05/09/2010  . Hyperlipidemia 05/09/2010    Jerl Mina ,PT, DPT, E-RYT  03/15/2016, 9:50  PM  [place] MAIN [facility] SERVICES [address] [place], [place], [address] Phone: [phone]   Fax:  [phone]  Name: Meagan Cox MRN: [id number] Date of Birth: 06/19/1940

## 2016-03-15 NOTE — Patient Instructions (Signed)
Isometric holds 5 sec x 5 in hip abduction (in line with hip width)   Progress bridges with slight hip ER/Abd   Progress 3 way hip ext with yellow band   Return to monster walking with band

## 2016-03-19 ENCOUNTER — Ambulatory Visit: Payer: PPO | Admitting: Physical Therapy

## 2016-03-20 ENCOUNTER — Ambulatory Visit: Payer: PPO | Admitting: Physical Therapy

## 2016-03-20 DIAGNOSIS — R2689 Other abnormalities of gait and mobility: Secondary | ICD-10-CM

## 2016-03-20 DIAGNOSIS — M791 Myalgia, unspecified site: Secondary | ICD-10-CM

## 2016-03-20 DIAGNOSIS — R293 Abnormal posture: Secondary | ICD-10-CM

## 2016-03-20 NOTE — Patient Instructions (Signed)
Co-activate the belly and pelvic floor on exhalation when moving the leg(last sessions' exercise)    Hip flexion (thigh up) with band on the thigh  B legs  Hands on chair  After 10 reps with exhalation on lifting the leg each , perform the stretch on on the stool

## 2016-03-22 NOTE — Therapy (Addendum)
[place] MAIN [facility] SERVICES [address] [place], [place], [address] Phone: [phone]   Fax:  [phone]  Physical Therapy Treatment   Patient Details  Name: Meagan Cox MRN: [id number] Date of Birth: [birth year] Referring Provider: Derrel Nip MD  Encounter Date: 03/20/2016      PT End of Session - 03/21/16 2354    Visit Number 30   Date for PT Re-Evaluation 04/16/16   Authorization Type 10/10    PT Start Time 1600   PT Stop Time 1700   PT Time Calculation (min) 60 min   Activity Tolerance Patient tolerated treatment well;No increased pain   Behavior During Therapy WFL for tasks assessed/performed      Past Medical History:  Diagnosis Date  . Hyperlipidemia   . Hypertension   . Hypothyroidism     Past Surgical History:  Procedure Laterality Date  . ABDOMINAL HYSTERECTOMY    . CARDIAC CATHETERIZATION     30 years ago, [place]  . CHOLECYSTECTOMY N/A 08/21/2015   Procedure: LAPAROSCOPIC CHOLECYSTECTOMY;  Surgeon: Jules Husbands, MD;  Location: [facility] ORS;  Service: General;  Laterality: N/A;  . PARTIAL HYSTERECTOMY    . TONSILLECTOMY      There were no vitals filed for this visit.      Subjective Assessment - 03/21/16 2352    Subjective Pt reported her medial mm over her thigh felt better after last session and she is able to get her thigh over the side of the bed a lot better. Pt had atleast 2 catches with her leg.    Pertinent History R medial thigh pain: Pt has trouble sleeping  on L side due to L arm pain  and tries to sleep on her back and belly.  Pt has difficulty sleeping on R  side due to R hip bursitis. Hx of R knee issues (meniscus "slipping out out place" and chriporactor helps to realign). Resolved for the past 1-2 years.   Gynecological: 3 vaginal birth,  without perineal trauma.  SUI . Denied saddle signs.  Pt travels to [place] 2x a year. Pt will be leaving in August for her next trip.              [facility]  PT Assessment - 03/21/16 2352      Coordination   Gross Motor Movements are Fluid and Coordinated --  less pain and ability for AROM hip add, flex w/ cue for PFM      Palpation   Palpation comment fascial restrictions along inguinal crease only                      [facility] Adult PT Treatment/Exercise - 03/21/16 2354      Therapeutic Activites    Therapeutic Activities --  see pt instructions     Neuro Re-ed    Neuro Re-ed Details  see pt instructions                PT Education - 03/21/16 2354    Education provided Yes   Education Details HEP   Person(s) Educated Patient   Methods Explanation;Demonstration;Tactile cues;Verbal cues;Handout   Comprehension Returned demonstration;Verbalized understanding             PT Long Term Goals - 03/21/16 2355      PT LONG TERM GOAL #1   Title Pt will decrease her ODI score from 24% to < 20% in order to travel on her trips to [place]  and walk long distances on uneven surfaces.  (09/26/15: 12%)    Time 12   Period Weeks   Status Achieved     PT LONG TERM GOAL #2   Title Pt will decrease her PSQI score from 29% to < 24% in order to improve sleep quality and demonstrate ability to lay in a comfortable position.  (1%)   Time 12   Period Weeks   Status Achieved     PT LONG TERM GOAL #3   Title Pt will be complaint with water intake in order to promote bladder health and hydration for muscles for walking.    Time 12   Status Achieved     PT LONG TERM GOAL #4   Title Pt will demo no pelvic asymmetries and increased hip PROM IR bilaterally > 10 deg in order to improve gait mechanics for ambulation.    Time 12   Period Weeks   Status Achieved     PT LONG TERM GOAL #5   Title Pt will demo no pain with sidestepping L with yellow resistive band at thigh across 5 steps in order to perform exercises and to return to walking longer distances and maintain proper alignment of hips and LE.   Time 12   Period Weeks    Status Achieved     Additional Long Term Goals   Additional Long Term Goals Yes     PT LONG TERM GOAL #6   Title Pt will demo IND with HEP   Time 12   Period Weeks   Status Partially Met     PT LONG TERM GOAL #7   Title Pt will demo decreased thoracic/ lumbar/ gluteal mm tensions B and report no reoccurence of pain at R glut and medial to ASIS/inguinal area across 4 weeks in order to demo IND with self-maintenence to m inimize relapse of pain.    Time 12   Period Weeks   Status Achieved     PT LONG TERM GOAL #8   Title Pt will be able to perform hip abd/ER with R heel over L thigh in order to demo less pain and to perform higher functional fitness exercises to minimize relapse of pain   Time 12   Period Weeks   Status Achieved     PT LONG TERM GOAL  #9   TITLE Pt will report to be able lay on her R side to sleep in order to improve QOL   Time 12   Period Weeks   Status On-going     PT LONG TERM GOAL  #10   TITLE Pt will demo less gait deviations ( heel striking, short stride, decreased hip flexion) across 2 visits without cuing in order to walk with improved mechanics and minimize risks for relapse of pain and to go on foriegn travels.   Time 12   Period Weeks   Status Achieved     PT LONG TERM GOAL  #11   TITLE Pt will be able to adduct and flex hip in supine, standing hip flex/ext/ abduction/adduction, seated hip flex/abd/ER with co-activation with pelvic floor without pain and with functional ROM in order to seat, stand, and walk on out of town trips     Time Glide - 03/22/16 0005    Clinical Impression Statement Pt continues to show increased ROM of RLE compared  last session. Pt was able to perform hip adduction in supine and hip flexion in standing  with less pain after cuing to co-activate her pelvic floor/ deep core mm.  Continue to address pelvic floor strengthening and motor control training. Pt continues to  benefit skilled PT.    Rehab Potential Good   PT Frequency 1x / week   PT Duration 12 weeks   PT Treatment/Interventions ADLs/Self Care Home Management;Aquatic Therapy;Gait training;Traction;Moist Heat;Stair training;Functional mobility training;Therapeutic activities;Therapeutic exercise;Balance training;Neuromuscular re-education;Electrical Stimulation;Cryotherapy;Manual techniques;Patient/family education;Passive range of motion;Dry needling;Energy conservation   Consulted and Agree with Plan of Care Patient      Patient will benefit from skilled therapeutic intervention in order to improve the following deficits and impairments:  Abnormal gait, Pain, Improper body mechanics, Postural dysfunction, Other (comment), Decreased mobility, Decreased coordination, Decreased activity tolerance, Decreased endurance, Decreased balance, Decreased safety awareness, Impaired flexibility, Decreased range of motion, Obesity, Decreased strength, Cardiopulmonary status limiting activity, Difficulty walking, Decreased scar mobility  Visit Diagnosis: Myalgia  Poor posture  Other abnormalities of gait and mobility       G-Codes - April 08, 2016 0000    Functional Assessment Tool Used (Outpatient Only) clinical judgement   Functional Limitation Self care;Mobility: Walking and moving around   Mobility: Walking and Moving Around Current Status [phone]) At least 20 percent but less than 40 percent impaired, limited or restricted   Mobility: Walking and Moving Around Goal Status [phone]) At least 20 percent but less than 40 percent impaired, limited or restricted   Self Care Current Status (D3143) At least 1 percent but less than 20 percent impaired, limited or restricted   Self Care Goal Status (O8875) At least 1 percent but less than 20 percent impaired, limited or restricted      Problem List Patient Active Problem List   Diagnosis Date Noted  . Cholecystitis 08/21/2015  . Medicare annual wellness visit,  subsequent 07/24/2015  . Cerebrovascular small vessel disease 04/24/2015  . Benign paroxysmal positional vertigo 04/24/2015  . Pain in joint, pelvic region and thigh 04/24/2015  . Counseling for travel 01/11/2015  . Shoulder pain, right 11/22/2013  . Statin intolerance 10/08/2012  . Edema 05/14/2012  . Generalized anxiety disorder 01/06/2012  . 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 Hypothyroidism 12/18/2010  . Low back pain radiating to both legs 12/18/2010  . Hip pain, right 12/18/2010  . Obesity 05/09/2010  . CAD (coronary artery disease) 05/09/2010  . Hyperlipidemia 05/09/2010    Jerl Mina ,PT, DPT, E-RYT  04/08/16, 12:07 AM  [place] MAIN [facility] SERVICES [address] [place], [place], [address] Phone: [phone]   Fax:  [phone]  Name: Cherree Conerly MRN: [id number] Date of Birth: May 24, 1940

## 2016-03-28 ENCOUNTER — Ambulatory Visit: Payer: PPO | Admitting: Physical Therapy

## 2016-03-28 DIAGNOSIS — R2689 Other abnormalities of gait and mobility: Secondary | ICD-10-CM

## 2016-03-28 DIAGNOSIS — M791 Myalgia, unspecified site: Secondary | ICD-10-CM

## 2016-03-28 DIAGNOSIS — R293 Abnormal posture: Secondary | ICD-10-CM

## 2016-03-29 NOTE — Patient Instructions (Signed)
Focus on pelvic floor lengthening with previous HEP  Gait training with feet wider under hips    Mini squats

## 2016-03-29 NOTE — Therapy (Signed)
[place] MAIN [facility] SERVICES [address] [place], [place], [address] Phone: [phone]   Fax:  [phone]  Physical Therapy Treatment  Patient Details  Name: Meagan Cox MRN: [id number] Date of Birth: [birth year] Referring Provider: Derrel Nip MD  Encounter Date: 03/28/2016      PT End of Session - 03/29/16 0020    Visit Number 31   Date for PT Re-Evaluation 04/16/16   Authorization Type 1   PT Start Time 3435   PT Stop Time 1725   PT Time Calculation (min) 70 min   Activity Tolerance Patient tolerated treatment well;No increased pain   Behavior During Therapy WFL for tasks assessed/performed      Past Medical History:  Diagnosis Date  . Hyperlipidemia   . Hypertension   . Hypothyroidism     Past Surgical History:  Procedure Laterality Date  . ABDOMINAL HYSTERECTOMY    . CARDIAC CATHETERIZATION     30 years ago, [place]  . CHOLECYSTECTOMY N/A 08/21/2015   Procedure: LAPAROSCOPIC CHOLECYSTECTOMY;  Surgeon: Jules Husbands, MD;  Location: [facility] ORS;  Service: General;  Laterality: N/A;  . PARTIAL HYSTERECTOMY    . TONSILLECTOMY      There were no vitals filed for this visit.      Subjective Assessment - 03/28/16 1629    Subjective Pt reported relapsed pain at medial thigh and posterior buttock  with walking and with crossing R ankle over L thigh. Pt has been able to perform last HEP without difficulty.    Pertinent History R medial thigh pain: Pt has trouble sleeping  on L side due to L arm pain  and tries to sleep on her back and belly.  Pt has difficulty sleeping on R  side due to R hip bursitis. Hx of R knee issues (meniscus "slipping out out place" and chriporactor helps to realign). Resolved for the past 1-2 years.   Gynecological: 3 vaginal birth,  without perineal trauma.  SUI . Denied saddle signs.  Pt travels to [place] 2x a year. Pt will be leaving in August for her next trip.              [facility] PT Assessment  - 03/29/16 0014      Single Leg Stance   Comments hip flex 125 deg on R, 130 deg on L with single UE on wall  , Post Tx: 130 deg on R      AROM   Overall AROM Comments pt tearful with hip adduction/flex during Tx, no tears and less grimacing with hip add, flex following Tx     Palpation   Palpation comment no tensions along rectus femoris at both attachments. Increased mm tensions at quadratus femoris                   Pelvic Floor Special Questions - 03/29/16 0015    Prolapse --  no lowered position of bladder   Pelvic Floor Internal Exam pt verbally consented without contraindications   Exam Type Vaginal   Palpation referred pain to L thigh and AIIS with palpation on proximal attachment of R obt int,  significant mm tensions are noted    Strength fair squeeze, definite lift  delayed lengthening            [facility] Adult PT Treatment/Exercise - 03/29/16 0018      Therapeutic Activites    Therapeutic Activities --  see pt instructions     Neuro Re-ed  Neuro Re-ed Details  see pt instructions     Manual Therapy   Internal Pelvic Floor R obt internus, STM and MWM                       PT Long Term Goals - 03/21/16 2355      PT LONG TERM GOAL #1   Title Pt will decrease her ODI score from 24% to < 20% in order to travel on her trips to [place] and walk long distances on uneven surfaces.  (09/26/15: 12%)    Time 12   Period Weeks   Status Achieved     PT LONG TERM GOAL #2   Title Pt will decrease her PSQI score from 29% to < 24% in order to improve sleep quality and demonstrate ability to lay in a comfortable position.  (1%)   Time 12   Period Weeks   Status Achieved     PT LONG TERM GOAL #3   Title Pt will be complaint with water intake in order to promote bladder health and hydration for muscles for walking.    Time 12   Status Achieved     PT LONG TERM GOAL #4   Title Pt will demo no pelvic asymmetries and increased hip PROM IR  bilaterally > 10 deg in order to improve gait mechanics for ambulation.    Time 12   Period Weeks   Status Achieved     PT LONG TERM GOAL #5   Title Pt will demo no pain with sidestepping L with yellow resistive band at thigh across 5 steps in order to perform exercises and to return to walking longer distances and maintain proper alignment of hips and LE.   Time 12   Period Weeks   Status Achieved     Additional Long Term Goals   Additional Long Term Goals Yes     PT LONG TERM GOAL #6   Title Pt will demo IND with HEP   Time 12   Period Weeks   Status Partially Met     PT LONG TERM GOAL #7   Title Pt will demo decreased thoracic/ lumbar/ gluteal mm tensions B and report no reoccurence of pain at R glut and medial to ASIS/inguinal area across 4 weeks in order to demo IND with self-maintenence to m inimize relapse of pain.    Time 12   Period Weeks   Status Achieved     PT LONG TERM GOAL #8   Title Pt will be able to perform hip abd/ER with R heel over L thigh in order to demo less pain and to perform higher functional fitness exercises to minimize relapse of pain   Time 12   Period Weeks   Status Achieved     PT LONG TERM GOAL  #9   TITLE Pt will report to be able lay on her R side to sleep in order to improve QOL   Time 12   Period Weeks   Status On-going     PT LONG TERM GOAL  #10   TITLE Pt will demo less gait deviations ( heel striking, short stride, decreased hip flexion) across 2 visits without cuing in order to walk with improved mechanics and minimize risks for relapse of pain and to go on foriegn travels.   Time 12   Period Weeks   Status Achieved     PT LONG TERM GOAL  #11   TITLE Pt will  be able to adduct and flex hip in supine, standing hip flex/ext/ abduction/adduction, seated hip flex/abd/ER with co-activation with pelvic floor without pain and with functional ROM in order to seat, stand, and walk on out of town trips     Time 12   Period Weeks   Status  New               Plan - 03/29/16 0022    Clinical Impression Statement Pt 's relapsed pains at posterior glut and along path of rectus femoris are likely due to pt's delayed pelvic floor lengthening coordiantion and increased R obturator internus.  Internal palpation at this mm referred pain to AIIS and lateral thigh which are locations of her pain. Fascial restrictions/ mm tensions along lateral and medial thigh were absent which indicate improvement. Pt was able to increase AROM hip flexion from 125 deg to 130 deg on RLE post Tx. Pt also demo'd decreased tenderness and tensions in her pelvic floor with increased ability for pelvic floor lengthening. withheld pelvic floor co-activation with hip exercises. Pt will continue to benefit from skilled PT. Plan to request X Ray or MRI and refer back to MD if pt does not show progress or continues to have relapse of pain   Rehab Potential Good   PT Frequency 1x / week   PT Duration 12 weeks   PT Treatment/Interventions ADLs/Self Care Home Management;Aquatic Therapy;Gait training;Traction;Moist Heat;Stair training;Functional mobility training;Therapeutic activities;Therapeutic exercise;Balance training;Neuromuscular re-education;Electrical Stimulation;Cryotherapy;Manual techniques;Patient/family education;Passive range of motion;Dry needling;Energy conservation   Consulted and Agree with Plan of Care Patient      Patient will benefit from skilled therapeutic intervention in order to improve the following deficits and impairments:  Abnormal gait, Pain, Improper body mechanics, Postural dysfunction, Other (comment), Decreased mobility, Decreased coordination, Decreased activity tolerance, Decreased endurance, Decreased balance, Decreased safety awareness, Impaired flexibility, Decreased range of motion, Obesity, Decreased strength, Cardiopulmonary status limiting activity, Difficulty walking, Decreased scar mobility  Visit Diagnosis: Myalgia  Poor  posture  Other abnormalities of gait and mobility     Problem List Patient Active Problem List   Diagnosis Date Noted  . Cholecystitis 08/21/2015  . Medicare annual wellness visit, subsequent 07/24/2015  . Cerebrovascular small vessel disease 04/24/2015  . Benign paroxysmal positional vertigo 04/24/2015  . Pain in joint, pelvic region and thigh 04/24/2015  . Counseling for travel 01/11/2015  . Shoulder pain, right 11/22/2013  . Statin intolerance 10/08/2012  . Edema 05/14/2012  . Generalized anxiety disorder 01/06/2012  . 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 Hypothyroidism 12/18/2010  . Low back pain radiating to both legs 12/18/2010  . Hip pain, right 12/18/2010  . Obesity 05/09/2010  . CAD (coronary artery disease) 05/09/2010  . Hyperlipidemia 05/09/2010    Jerl Mina ,PT, DPT, E-RYT  03/29/2016, 12:26 AM  [place] MAIN [facility] SERVICES [address] [place], [place], [address] Phone: [phone]   Fax:  [phone]  Name: Nysia Dell MRN: [id number] Date of Birth: February 09, 1940

## 2016-03-29 NOTE — Addendum Note (Signed)
Addended by: Jerl Mina on: 03/29/2016 12:34 AM   Modules accepted: Orders

## 2016-04-06 ENCOUNTER — Ambulatory Visit: Payer: PPO | Attending: Internal Medicine | Admitting: Physical Therapy

## 2016-04-06 DIAGNOSIS — R2689 Other abnormalities of gait and mobility: Secondary | ICD-10-CM | POA: Insufficient documentation

## 2016-04-06 DIAGNOSIS — M791 Myalgia, unspecified site: Secondary | ICD-10-CM

## 2016-04-06 DIAGNOSIS — R293 Abnormal posture: Secondary | ICD-10-CM | POA: Diagnosis not present

## 2016-04-06 NOTE — Patient Instructions (Signed)
Heel slide ankle against L shin when laying on back and in seated position to stretch back butt muscles

## 2016-04-06 NOTE — Therapy (Signed)
[place] MAIN [facility] SERVICES [address] [place], [place], [address] Phone: [phone]   Fax:  [phone]  Physical Therapy Treatment  Patient Details  Name: Meagan Cox MRN: [id number] Date of Birth: 1940/10/04 Referring Provider: Derrel Nip MD  Encounter Date: 04/06/2016      PT End of Session - 04/06/16 1141    Visit Number 32   Date for PT Re-Evaluation 06/21/16   Authorization Type 2 g-code   PT Start Time 1015   PT Stop Time 1115   PT Time Calculation (min) 60 min   Activity Tolerance Patient tolerated treatment well;No increased pain   Behavior During Therapy WFL for tasks assessed/performed      Past Medical History:  Diagnosis Date  . Hyperlipidemia   . Hypertension   . Hypothyroidism     Past Surgical History:  Procedure Laterality Date  . ABDOMINAL HYSTERECTOMY    . CARDIAC CATHETERIZATION     30 years ago, [place]  . CHOLECYSTECTOMY N/A 08/21/2015   Procedure: LAPAROSCOPIC CHOLECYSTECTOMY;  Surgeon: Jules Husbands, MD;  Location: [facility] ORS;  Service: General;  Laterality: N/A;  . PARTIAL HYSTERECTOMY    . TONSILLECTOMY      There were no vitals filed for this visit.      Subjective Assessment - 04/06/16 1133    Subjective Pt reported she wants to go off of her mm relaxers. Pt reports she has not had the pulling sensation along the medial thigh to knee and in the buttocks since last session. Pt is feeling "shocked" at how it has been feeling good. Pt only has soreness in the previous problem spots.              [facility] PT Assessment - 04/06/16 1027      Single Leg Stance   Comments crossing R ankle over L below knee, trunk-thigh flexion 115deg pre-Tx, at knee post Tx     Other:   Other/ Comments full hip ext in SLS and hip flex without wincing nor pain     AROM   Overall AROM Comments --     Flexibility   Soft Tissue Assessment /Muscle Length --  no pain with hip add/abd in supine, hip  flex/ER/Abd      Palpation   Palpation comment present tensions localized along sacroiliac and sacrotuberous ligament path, glut med attachments along iliac crest, quadratus femoris/ obt internus attachments to ischial rami and ischial tuberosity        Bed Mobility   Bed Mobility --  no pain log rolling, heel slides      Hip flexion strength seated 4/5 B                 [facility] Adult PT Treatment/Exercise - 04/06/16 1136      Therapeutic Activites    Therapeutic Activities --  see pt instructions     Moist Heat Therapy   Number Minutes Moist Heat 5 Minutes   Moist Heat Location Hip     Manual Therapy   Internal Pelvic Floor STM/ MWM along problem area                 PT Education - 04/06/16 1145    Education provided Yes   Education Details HEP, education to consult MD on how to wean off  mm relaxant medication   Person(s) Educated Patient   Methods Explanation;Demonstration;Tactile cues;Verbal cues;Handout   Comprehension Returned demonstration;Verbalized understanding  PT Long Term Goals - 04/06/16 1146      PT LONG TERM GOAL #1   Title Pt will decrease her ODI score from 24% to < 20% in order to travel on her trips to [place] and walk long distances on uneven surfaces.  (09/26/15: 12%)    Time 12   Period Weeks   Status Achieved     PT LONG TERM GOAL #2   Title Pt will decrease her PSQI score from 29% to < 24% in order to improve sleep quality and demonstrate ability to lay in a comfortable position.  (1%)   Time 12   Period Weeks   Status Achieved     PT LONG TERM GOAL #3   Title Pt will be complaint with water intake in order to promote bladder health and hydration for muscles for walking.    Time 12   Status Achieved     PT LONG TERM GOAL #4   Title Pt will demo no pelvic asymmetries and increased hip PROM IR bilaterally > 10 deg in order to improve gait mechanics for ambulation.    Time 12   Period Weeks   Status  Achieved     PT LONG TERM GOAL #5   Title Pt will demo no pain with sidestepping L with yellow resistive band at thigh across 5 steps in order to perform exercises and to return to walking longer distances and maintain proper alignment of hips and LE.   Time 12   Period Weeks   Status Achieved     PT LONG TERM GOAL #6   Title Pt will demo IND with HEP   Time 12   Period Weeks   Status Partially Met     PT LONG TERM GOAL #7   Title Pt will demo decreased thoracic/ lumbar/ gluteal mm tensions B and report no reoccurence of pain at R glut and medial to ASIS/inguinal area across 4 weeks in order to demo IND with self-maintenence to m inimize relapse of pain.    Time 12   Period Weeks   Status Achieved     PT LONG TERM GOAL #8   Title Pt will be able to perform hip abd/ER with R heel over L thigh in supine in order to demo less pain and to perform higher functional fitness exercises to minimize relapse of pain   Time 12   Period Weeks   Status Achieved     PT LONG TERM GOAL  #9   TITLE Pt will report to be able lay on her R side to sleep in order to improve QOL   Time 12   Period Weeks   Status On-going     PT LONG TERM GOAL  #10   TITLE Pt will demo less gait deviations ( heel striking, short stride, decreased hip flexion) across 2 visits without cuing in order to walk with improved mechanics and minimize risks for relapse of pain and to go on foriegn travels.   Time 12   Period Weeks   Status Achieved     PT LONG TERM GOAL  #11   TITLE Pt will be able to adduct and flex hip in supine, standing hip flex/ext/ abduction/adduction, seated hip flex/abd/ER with co-activation with pelvic floor without pain and with functional ROM in order to seat, stand, and walk on out of town trips     Time 12   Period Weeks   Status Partially Met  Plan - 04/06/16 1147    Clinical Impression Statement Pt showed signficantly improvement since last session and has only  soreness along medial thigh from AIIS to knee and in posterior glut mm. Pt demo'd increased ROM with hip ER/abd/ flex to cross ankle below L knee to don shoe which she has not been able to to perform before. Pt also showed increased hip flexion strengthe in R LE. Manual Tx decreased tightness along posterior pelvic girdle and ischial rami/ tuberosity attachments which allowed for pt to demo increased hip ER/abd/flexion to cross ankle at higher to the level of L knee. Deferred pt to consult her MD on her desire to decrease her mm  relaxant medication. Pt voiced understanding. Pt continues to benefit from skilled PT.     Rehab Potential Good   PT Frequency 1x / week   PT Duration 12 weeks   PT Treatment/Interventions ADLs/Self Care Home Management;Aquatic Therapy;Gait training;Traction;Moist Heat;Stair training;Functional mobility training;Therapeutic activities;Therapeutic exercise;Balance training;Neuromuscular re-education;Electrical Stimulation;Cryotherapy;Manual techniques;Patient/family education;Passive range of motion;Dry needling;Energy conservation   Consulted and Agree with Plan of Care Patient      Patient will benefit from skilled therapeutic intervention in order to improve the following deficits and impairments:  Abnormal gait, Pain, Improper body mechanics, Postural dysfunction, Other (comment), Decreased mobility, Decreased coordination, Decreased activity tolerance, Decreased endurance, Decreased balance, Decreased safety awareness, Impaired flexibility, Decreased range of motion, Obesity, Decreased strength, Cardiopulmonary status limiting activity, Difficulty walking, Decreased scar mobility  Visit Diagnosis: Myalgia  Poor posture  Other abnormalities of gait and mobility     Problem List Patient Active Problem List   Diagnosis Date Noted  . Cholecystitis 08/21/2015  . Medicare annual wellness visit, subsequent 07/24/2015  . Cerebrovascular small vessel disease 04/24/2015   . Benign paroxysmal positional vertigo 04/24/2015  . Pain in joint, pelvic region and thigh 04/24/2015  . Counseling for travel 01/11/2015  . Shoulder pain, right 11/22/2013  . Statin intolerance 10/08/2012  . Edema 05/14/2012  . Generalized anxiety disorder 01/06/2012  . 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 Hypothyroidism 12/18/2010  . Low back pain radiating to both legs 12/18/2010  . Hip pain, right 12/18/2010  . Obesity 05/09/2010  . CAD (coronary artery disease) 05/09/2010  . Hyperlipidemia 05/09/2010    Jerl Mina ,PT, DPT, E-RYT  04/06/2016, 11:51 AM  [place] MAIN [facility] SERVICES [address] [place], [place], [address] Phone: [phone]   Fax:  [phone]  Name: Gaby Harney MRN: [id number] Date of Birth: 1940/04/27

## 2016-04-07 ENCOUNTER — Other Ambulatory Visit: Payer: Self-pay | Admitting: Internal Medicine

## 2016-04-09 ENCOUNTER — Ambulatory Visit: Payer: PPO | Admitting: Physical Therapy

## 2016-04-23 ENCOUNTER — Ambulatory Visit: Payer: PPO | Admitting: Physical Therapy

## 2016-04-23 DIAGNOSIS — M791 Myalgia, unspecified site: Secondary | ICD-10-CM

## 2016-04-23 DIAGNOSIS — R2689 Other abnormalities of gait and mobility: Secondary | ICD-10-CM

## 2016-04-23 DIAGNOSIS — R293 Abnormal posture: Secondary | ICD-10-CM

## 2016-04-23 NOTE — Patient Instructions (Signed)
Laying down: Instead of knee out/ marching for deep core  Level 4:  Heel slide with knee slight out 3-4 inches 10 reps x 3 / 2 x day   __________  Semireclined:  Foot propped on stool: Assisted ankle lift with belt with knee bend like putting shoe on 10 x reps with rest, 3 sets    Resting with both feet on stool, belt at thigh, loosened to the width you can tolerate dropping knee out to stretch inner thigh  Butterfly wings 10 x 2

## 2016-04-23 NOTE — Therapy (Signed)
[place] MAIN [facility] SERVICES [address] [place], [place], [address] Phone: [phone]   Fax:  [phone]  Physical Therapy Treatment  Patient Details  Name: Meagan Cox MRN: [id number] Date of Birth: 1940/04/26 Referring Provider: Derrel Nip MD  Encounter Date: 04/23/2016      PT End of Session - 04/23/16 1414    Visit Number 33   Date for PT Re-Evaluation 06/21/16   Authorization Type 3 g-code   PT Start Time 1410   PT Stop Time 1500   PT Time Calculation (min) 50 min   Activity Tolerance Patient tolerated treatment well;No increased pain   Behavior During Therapy WFL for tasks assessed/performed      Past Medical History:  Diagnosis Date  . Hyperlipidemia   . Hypertension   . Hypothyroidism     Past Surgical History:  Procedure Laterality Date  . ABDOMINAL HYSTERECTOMY    . CARDIAC CATHETERIZATION     30 years ago, [place]  . CHOLECYSTECTOMY N/A 08/21/2015   Procedure: LAPAROSCOPIC CHOLECYSTECTOMY;  Surgeon: Jules Husbands, MD;  Location: [facility] ORS;  Service: General;  Laterality: N/A;  . PARTIAL HYSTERECTOMY    . TONSILLECTOMY      There were no vitals filed for this visit.      Subjective Assessment - 04/23/16 1415    Subjective Pt reported she has not been able to lay on her R side despite having a pillow between her legs. The area in the gluts feels sores all the time but it is better compared to the past. There is pulling along the medial anterior thigh to midthigh and it occurs sparotic now instead of happening all the time and moving to the knee level.    Pertinent History R medial thigh pain: Pt has trouble sleeping  on L side due to L arm pain  and tries to sleep on her back and belly.  Pt has difficulty sleeping on R  side due to R hip bursitis. Hx of R knee issues (meniscus "slipping out out place" and chriporactor helps to realign). Resolved for the past 1-2 years.   Gynecological: 3 vaginal birth,   without perineal trauma.  SUI . Denied saddle signs.  Pt travels to [place] 2x a year. Pt will be leaving in August for her next trip.     Patient Stated Goals standing long periods at the airport and traveling with less pain. "Quit hurting."            [facility] PT Assessment - 04/23/16 2253      AROM   Overall AROM Comments supine: no flinching pain with heel slide. Seated: cued for hip abd/ER to lift ankle over L thigh to decrease pain compared to hip adducted.  genu valgus decreased in standing. hip mobility in L SLS, R hip flexion. ext with full ROM.      Palpation   Palpation comment no tensions at medial/lateral thigh. minor tensions at R ischial tuberosity.  Tearful and report of pain at R AIIS/inguinal area with hip flex/slight adduction   minor tensions posteriolateral area                   Pelvic Floor Special Questions - 04/23/16 2258    Pelvic Floor Internal Exam pt verbally consented without contraindications   Exam Type Vaginal   Palpation referred pain to ASIS area with palpation at R obt int, minor tensions noted.  decreased post Tx   Strength fair squeeze,  definite lift  proper lengthening with less cuing            [facility] Adult PT Treatment/Exercise - 04/23/16 2257      Therapeutic Activites    Therapeutic Activities --  see pt instructions     Manual Therapy   Internal Pelvic Floor MWM, STM, at problem areas indicated in assessments   internal STM on obt int R                PT Education - 04/23/16 2300    Education provided Yes   Education Details HEP   Person(s) Educated Patient   Methods Explanation;Demonstration;Tactile cues;Verbal cues;Handout   Comprehension Returned demonstration;Verbalized understanding             PT Long Term Goals - 04/06/16 1146      PT LONG TERM GOAL #1   Title Pt will decrease her ODI score from 24% to < 20% in order to travel on her trips to [place] and walk long distances on uneven surfaces.   (09/26/15: 12%)    Time 12   Period Weeks   Status Achieved     PT LONG TERM GOAL #2   Title Pt will decrease her PSQI score from 29% to < 24% in order to improve sleep quality and demonstrate ability to lay in a comfortable position.  (1%)   Time 12   Period Weeks   Status Achieved     PT LONG TERM GOAL #3   Title Pt will be complaint with water intake in order to promote bladder health and hydration for muscles for walking.    Time 12   Status Achieved     PT LONG TERM GOAL #4   Title Pt will demo no pelvic asymmetries and increased hip PROM IR bilaterally > 10 deg in order to improve gait mechanics for ambulation.    Time 12   Period Weeks   Status Achieved     PT LONG TERM GOAL #5   Title Pt will demo no pain with sidestepping L with yellow resistive band at thigh across 5 steps in order to perform exercises and to return to walking longer distances and maintain proper alignment of hips and LE.   Time 12   Period Weeks   Status Achieved     PT LONG TERM GOAL #6   Title Pt will demo IND with HEP   Time 12   Period Weeks   Status Partially Met     PT LONG TERM GOAL #7   Title Pt will demo decreased thoracic/ lumbar/ gluteal mm tensions B and report no reoccurence of pain at R glut and medial to ASIS/inguinal area across 4 weeks in order to demo IND with self-maintenence to m inimize relapse of pain.    Time 12   Period Weeks   Status Achieved     PT LONG TERM GOAL #8   Title Pt will be able to perform hip abd/ER with R heel over L thigh in supine in order to demo less pain and to perform higher functional fitness exercises to minimize relapse of pain   Time 12   Period Weeks   Status Achieved     PT LONG TERM GOAL  #9   TITLE Pt will report to be able lay on her R side to sleep in order to improve QOL   Time 12   Period Weeks   Status On-going     PT LONG TERM GOAL  #  10   TITLE Pt will demo less gait deviations ( heel striking, short stride, decreased hip  flexion) across 2 visits without cuing in order to walk with improved mechanics and minimize risks for relapse of pain and to go on foriegn travels.   Time 12   Period Weeks   Status Achieved     PT LONG TERM GOAL  #11   TITLE Pt will be able to adduct and flex hip in supine, standing hip flex/ext/ abduction/adduction, seated hip flex/abd/ER with co-activation with pelvic floor without pain and with functional ROM in order to seat, stand, and walk on out of town trips     Time 12   Period Weeks   Status Partially Met               Plan - 04/23/16 2300    Clinical Impression Statement Pt demo'd increased SIJ ROM and less tensions in previously tense areas of pelvic floor mm, lateral/posterior/medial aspects of R thigh. Pt improved with increased abilit to cross R ankle over L thigh with slight posterior trunk lean and cue for hip ER/ abd before flexion and with UE assist. Initiated seated HEP to promote synergistic strength of sartoris to assist with crossing ankle over thigh for donning socks and shoes. Pt continues to benefit from skilled PT.     Rehab Potential Good   PT Frequency 1x / week   PT Duration 12 weeks   PT Treatment/Interventions ADLs/Self Care Home Management;Aquatic Therapy;Gait training;Traction;Moist Heat;Stair training;Functional mobility training;Therapeutic activities;Therapeutic exercise;Balance training;Neuromuscular re-education;Electrical Stimulation;Cryotherapy;Manual techniques;Patient/family education;Passive range of motion;Dry needling;Energy conservation   Consulted and Agree with Plan of Care Patient      Patient will benefit from skilled therapeutic intervention in order to improve the following deficits and impairments:  Abnormal gait, Pain, Improper body mechanics, Postural dysfunction, Other (comment), Decreased mobility, Decreased coordination, Decreased activity tolerance, Decreased endurance, Decreased balance, Decreased safety awareness, Impaired  flexibility, Decreased range of motion, Obesity, Decreased strength, Cardiopulmonary status limiting activity, Difficulty walking, Decreased scar mobility  Visit Diagnosis: Myalgia  Poor posture  Other abnormalities of gait and mobility     Problem List Patient Active Problem List   Diagnosis Date Noted  . Cholecystitis 08/21/2015  . Medicare annual wellness visit, subsequent 07/24/2015  . Cerebrovascular small vessel disease 04/24/2015  . Benign paroxysmal positional vertigo 04/24/2015  . Pain in joint, pelvic region and thigh 04/24/2015  . Counseling for travel 01/11/2015  . Shoulder pain, right 11/22/2013  . Statin intolerance 10/08/2012  . Edema 05/14/2012  . Generalized anxiety disorder 01/06/2012  . 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 Hypothyroidism 12/18/2010  . Low back pain radiating to both legs 12/18/2010  . Hip pain, right 12/18/2010  . Obesity 05/09/2010  . CAD (coronary artery disease) 05/09/2010  . Hyperlipidemia 05/09/2010    Jerl Mina ,PT, DPT, E-RYT  04/23/2016, 11:13 PM  [place] MAIN [facility] SERVICES [address] [place], [place], [address] Phone: [phone]   Fax:  [phone]  Name: Meagan Cox MRN: [id number] Date of Birth: [birth year]

## 2016-04-30 ENCOUNTER — Ambulatory Visit: Payer: PPO | Attending: Internal Medicine | Admitting: Physical Therapy

## 2016-04-30 DIAGNOSIS — R293 Abnormal posture: Secondary | ICD-10-CM

## 2016-04-30 DIAGNOSIS — M791 Myalgia, unspecified site: Secondary | ICD-10-CM

## 2016-04-30 DIAGNOSIS — R2689 Other abnormalities of gait and mobility: Secondary | ICD-10-CM | POA: Diagnosis not present

## 2016-04-30 NOTE — Therapy (Signed)
[place] MAIN [facility] SERVICES [address] [place], [place], [address] Phone: [phone]   Fax:  [phone]  Physical Therapy Treatment/Progress Note  Patient Details  Name: Meagan Cox MRN: [id number] Date of Birth: [birth year] Referring Provider: Derrel Nip MD  Encounter Date: 04/30/2016      PT End of Session - 04/30/16 0913    Visit Number 34   Date for PT Re-Evaluation 06/21/16   Authorization Type 4 g-code   PT Start Time 0808   PT Stop Time 0915   PT Time Calculation (min) 67 min   Activity Tolerance Patient tolerated treatment well;No increased pain   Behavior During Therapy WFL for tasks assessed/performed      Past Medical History:  Diagnosis Date  . Hyperlipidemia   . Hypertension   . Hypothyroidism     Past Surgical History:  Procedure Laterality Date  . ABDOMINAL HYSTERECTOMY    . CARDIAC CATHETERIZATION     30 years ago, [place]  . CHOLECYSTECTOMY N/A 08/21/2015   Procedure: LAPAROSCOPIC CHOLECYSTECTOMY;  Surgeon: Jules Husbands, MD;  Location: [facility] ORS;  Service: General;  Laterality: N/A;  . PARTIAL HYSTERECTOMY    . TONSILLECTOMY      There were no vitals filed for this visit.      Subjective Assessment - 04/30/16 0856    Subjective Pt reports tightness returning over the outer and and top of the thigh as well as the posterior gluts. Pt also reports it has not given out on her the past week   Pertinent History R medial thigh pain: Pt has trouble sleeping  on L side due to L arm pain  and tries to sleep on her back and belly.  Pt has difficulty sleeping on R  side due to R hip bursitis. Hx of R knee issues (meniscus "slipping out out place" and chriporactor helps to realign). Resolved for the past 1-2 years.   Gynecological: 3 vaginal birth,  without perineal trauma.  SUI . Denied saddle signs.  Pt travels to [place] 2x a year. Pt will be leaving in August for her next trip.     Patient Stated Goals standing  long periods at the airport and traveling with less pain. "Quit hurting."            [facility] PT Assessment - 04/30/16 0907      Single Leg Stance   Comments ankle to mid calf on R seated      Palpation   Palpation comment tensions at ischial tuberosity R, minor fascial tensions along anteriomedial aspect of thigh R                   Pelvic Floor Special Questions - 04/30/16 0907    Pelvic Floor Internal Exam pt verbally consented without contraindications   Exam Type Vaginal   Palpation tightness and tenderness at R obt int (decreased post Tx)            [facility] Adult PT Treatment/Exercise - 04/30/16 0165      Neuro Re-ed    Neuro Re-ed Details  see pt instructions     Manual Therapy   Internal Pelvic Floor R obt int MWM. MET                 PT Education - 04/30/16 0920    Education provided Yes   Education Details HEP   Person(s) Educated Patient   Methods Explanation;Demonstration;Tactile cues;Verbal cues;Handout   Comprehension Returned demonstration;Verbalized  understanding             PT Long Term Goals - 04/30/16 0921      PT LONG TERM GOAL #1   Title Pt will decrease her ODI score from 24% to < 20% in order to travel on her trips to [place] and walk long distances on uneven surfaces.  (09/26/15: 12%)    Time 12   Period Weeks   Status Achieved     PT LONG TERM GOAL #2   Title Pt will decrease her PSQI score from 29% to < 24% in order to improve sleep quality and demonstrate ability to lay in a comfortable position.  (1%)   Time 12   Period Weeks   Status Achieved     PT LONG TERM GOAL #3   Title Pt will be complaint with water intake in order to promote bladder health and hydration for muscles for walking.    Time 12   Status Achieved     PT LONG TERM GOAL #4   Title Pt will demo no pelvic asymmetries and increased hip PROM IR bilaterally > 10 deg in order to improve gait mechanics for ambulation.    Time 12   Period Weeks    Status Achieved     PT LONG TERM GOAL #5   Title Pt will demo no pain with sidestepping L with yellow resistive band at thigh across 5 steps in order to perform exercises and to return to walking longer distances and maintain proper alignment of hips and LE.   Time 12   Period Weeks   Status Achieved     PT LONG TERM GOAL #6   Title Pt will demo IND with HEP   Time 12   Period Weeks   Status Partially Met     PT LONG TERM GOAL #7   Title Pt will demo decreased thoracic/ lumbar/ gluteal mm tensions B and report no reoccurence of pain at R glut and medial to ASIS/inguinal area across 4 weeks in order to demo IND with self-maintenence to m inimize relapse of pain.    Time 12   Period Weeks   Status Achieved     PT LONG TERM GOAL #8   Title Pt will be able to perform hip abd/ER with R heel over L thigh in supine in order to demo less pain and to perform higher functional fitness exercises to minimize relapse of pain   Time 12   Period Weeks   Status Achieved     PT LONG TERM GOAL  #9   TITLE Pt will report to be able lay on her R side to sleep in order to improve QOL   Time 12   Period Weeks   Status On-going     PT LONG TERM GOAL  #10   TITLE Pt will demo less gait deviations ( heel striking, short stride, decreased hip flexion) across 2 visits without cuing in order to walk with improved mechanics and minimize risks for relapse of pain and to go on foriegn travels.   Time 12   Period Weeks   Status Achieved     PT LONG TERM GOAL  #11   TITLE Pt will be able to adduct and flex hip in supine, standing hip flex/ext/ abduction/adduction, seated hip flex/abd/ER with co-activation with pelvic floor without pain and with functional ROM in order to seat, stand, and walk on out of town trips     Time 64  Period Weeks   Status Partially Met               Plan - 04/30/16 0921    Clinical Impression Statement Pt  has achieved 8/11 goals and is  progressing well towards  remaining goals which are focused on being able  to sleep on her R side and to cross her R ankle over L thigh to don shoes. Pt demo'd the following significant improvements: less genu valgus in gait, increased hip strength and SIJ mobility, and decreased pelvic floor mm tensions. Today, pt showed decreased pelvic floor mm tensions (R obturator internus)  compared to previous sessions. Pt required internal manual Tx to decrease  remaining tensions which enabled pt to demo less pain with hip  abduction/ER in supine and seated positions. Pt also demo'd decreased  tenderness and tensions at R ischial tuberosity and anterolateral R thigh  following Tx. Pt will be seen 2x /week in order  to  prepare pt for her travel vacation to minmize relapse of Sx.      Rehab Potential Good   PT Frequency 2x / week   PT Duration 12 weeks   PT Treatment/Interventions ADLs/Self Care Home Management;Aquatic Therapy;Gait training;Traction;Moist Heat;Stair training;Functional mobility training;Therapeutic activities;Therapeutic exercise;Balance training;Neuromuscular re-education;Electrical Stimulation;Cryotherapy;Manual techniques;Patient/family education;Passive range of motion;Dry needling;Energy conservation   Consulted and Agree with Plan of Care Patient      Patient will benefit from skilled therapeutic intervention in order to improve the following deficits and impairments:  Abnormal gait, Pain, Improper body mechanics, Postural dysfunction, Other (comment), Decreased mobility, Decreased coordination, Decreased activity tolerance, Decreased endurance, Decreased balance, Decreased safety awareness, Impaired flexibility, Decreased range of motion, Obesity, Decreased strength, Cardiopulmonary status limiting activity, Difficulty walking, Decreased scar mobility  Visit Diagnosis: Myalgia  Poor posture  Other abnormalities of gait and mobility     Problem List Patient Active Problem List   Diagnosis Date  Noted  . Cholecystitis 08/21/2015  . Medicare annual wellness visit, subsequent 07/24/2015  . Cerebrovascular small vessel disease 04/24/2015  . Benign paroxysmal positional vertigo 04/24/2015  . Pain in joint, pelvic region and thigh 04/24/2015  . Counseling for travel 01/11/2015  . Shoulder pain, right 11/22/2013  . Statin intolerance 10/08/2012  . Edema 05/14/2012  . Generalized anxiety disorder 01/06/2012  . 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 Hypothyroidism 12/18/2010  . Low back pain radiating to both legs 12/18/2010  . Hip pain, right 12/18/2010  . Obesity 05/09/2010  . CAD (coronary artery disease) 05/09/2010  . Hyperlipidemia 05/09/2010    Jerl Mina ,PT, DPT, E-RYT  04/30/2016, 9:30 AM  [place] MAIN [facility] SERVICES [address] [place], [place], [address] Phone: [phone]   Fax:  [phone]  Name: Meagan Cox MRN: [id number] Date of Birth: [birth year]

## 2016-04-30 NOTE — Patient Instructions (Addendum)
Stretches  Frog stretch (belly)  10 reps    leanback, knees apart and close back and forth  10 reps     Strengthening Leaned slight , R thigh lifts  On exhale   10reps x 2./ 3 x day     Pressing thigh against hand or chair leg for 5 secs , 10 reps  X 3x day    Hands on back of chair: Kick backs without arching back  10 x 2 / each leg    Sleeping:  Pillow under R knee and between both knees

## 2016-05-02 ENCOUNTER — Ambulatory Visit: Payer: PPO | Admitting: Physical Therapy

## 2016-05-02 DIAGNOSIS — M791 Myalgia, unspecified site: Secondary | ICD-10-CM

## 2016-05-02 DIAGNOSIS — R2689 Other abnormalities of gait and mobility: Secondary | ICD-10-CM

## 2016-05-02 DIAGNOSIS — R293 Abnormal posture: Secondary | ICD-10-CM

## 2016-05-02 NOTE — Patient Instructions (Addendum)
Sit less,  Discontinue seated exercise from last session   _______________  To increase movement to of the sacroiliac joint:  _Modify the frog stretch (instead of dropping ankles, keep ankles up, move thigh out from midline ) 10 reps  _bend knee, in hale, exhale lift thigh off bed without arch ing the back  10 x 3 rep _pelvic tilts in sitting   ___________  Continue with standing 3 way hip.    __________   Heel glides laying down, pillow under R knee,  Active feet, pinky toe side sweeping out Move opposite leg alternating 10 reps

## 2016-05-02 NOTE — Therapy (Signed)
[place] MAIN [facility] SERVICES [address] [place], [place], [address] Phone: [phone]   Fax:  [phone]  Physical Therapy Treatment  Patient Details  Name: Meagan Cox MRN: [id number] Date of Birth: 12-27-1940 Referring Provider: Derrel Nip MD  Encounter Date: 05/02/2016      PT End of Session - 05/02/16 0855    Visit Number 35   Number of Visits 12   Date for PT Re-Evaluation 06/21/16   Authorization Type 5 g-code   PT Start Time 0810   PT Stop Time 0900   PT Time Calculation (min) 50 min      Past Medical History:  Diagnosis Date  . Hyperlipidemia   . Hypertension   . Hypothyroidism     Past Surgical History:  Procedure Laterality Date  . ABDOMINAL HYSTERECTOMY    . CARDIAC CATHETERIZATION     30 years ago, [place]  . CHOLECYSTECTOMY N/A 08/21/2015   Procedure: LAPAROSCOPIC CHOLECYSTECTOMY;  Surgeon: Jules Husbands, MD;  Location: [facility] ORS;  Service: General;  Laterality: N/A;  . PARTIAL HYSTERECTOMY    . TONSILLECTOMY      There were no vitals filed for this visit.      Subjective Assessment - 05/02/16 0851    Subjective Pt reports her pain is gotten worse since last session with sitting and standing on it.   Pertinent History R medial thigh pain: Pt has trouble sleeping  on L side due to L arm pain  and tries to sleep on her back and belly.  Pt has difficulty sleeping on R  side due to R hip bursitis. Hx of R knee issues (meniscus "slipping out out place" and chriporactor helps to realign). Resolved for the past 1-2 years.   Gynecological: 3 vaginal birth,  without perineal trauma.  SUI . Denied saddle signs.  Pt travels to [place] 2x a year. Pt will be leaving in August for her next trip.     Patient Stated Goals standing long periods at the airport and traveling with less pain. "Quit hurting."            [facility] PT Assessment - 05/02/16 0851      Palpation   SI assessment  decreased nutation of sacrum,  tightness at SIJ R lateral side, attachments of glut mm along posterior iliac crest ( decreased tenderness and tensions post Tx)                      [facility] Adult PT Treatment/Exercise - 05/02/16 0174      Neuro Re-ed    Neuro Re-ed Details  see pt instructions     Moist Heat Therapy   Number Minutes Moist Heat 8 Minutes   Moist Heat Location --  SIJ     Manual Therapy   Internal Pelvic Floor seated: STM/ MWM along problem area indicated in assessment                PT Education - 05/02/16 0904    Education provided Yes   Education Details HEP, downgrade on exercises, modified exercises to increase hip ext, less hip flexion   Person(s) Educated Patient   Methods Explanation;Demonstration;Tactile cues;Verbal cues;Handout   Comprehension Verbalized understanding             PT Long Term Goals - 04/30/16 0921      PT LONG TERM GOAL #1   Title Pt will decrease her ODI score from 24% to < 20%  in order to travel on her trips to [place] and walk long distances on uneven surfaces.  (09/26/15: 12%)    Time 12   Period Weeks   Status Achieved     PT LONG TERM GOAL #2   Title Pt will decrease her PSQI score from 29% to < 24% in order to improve sleep quality and demonstrate ability to lay in a comfortable position.  (1%)   Time 12   Period Weeks   Status Achieved     PT LONG TERM GOAL #3   Title Pt will be complaint with water intake in order to promote bladder health and hydration for muscles for walking.    Time 12   Status Achieved     PT LONG TERM GOAL #4   Title Pt will demo no pelvic asymmetries and increased hip PROM IR bilaterally > 10 deg in order to improve gait mechanics for ambulation.    Time 12   Period Weeks   Status Achieved     PT LONG TERM GOAL #5   Title Pt will demo no pain with sidestepping L with yellow resistive band at thigh across 5 steps in order to perform exercises and to return to walking longer distances and maintain  proper alignment of hips and LE.   Time 12   Period Weeks   Status Achieved     PT LONG TERM GOAL #6   Title Pt will demo IND with HEP   Time 12   Period Weeks   Status Partially Met     PT LONG TERM GOAL #7   Title Pt will demo decreased thoracic/ lumbar/ gluteal mm tensions B and report no reoccurence of pain at R glut and medial to ASIS/inguinal area across 4 weeks in order to demo IND with self-maintenence to m inimize relapse of pain.    Time 12   Period Weeks   Status Achieved     PT LONG TERM GOAL #8   Title Pt will be able to perform hip abd/ER with R heel over L thigh in supine in order to demo less pain and to perform higher functional fitness exercises to minimize relapse of pain   Time 12   Period Weeks   Status Achieved     PT LONG TERM GOAL  #9   TITLE Pt will report to be able lay on her R side to sleep in order to improve QOL   Time 12   Period Weeks   Status On-going     PT LONG TERM GOAL  #10   TITLE Pt will demo less gait deviations ( heel striking, short stride, decreased hip flexion) across 2 visits without cuing in order to walk with improved mechanics and minimize risks for relapse of pain and to go on foriegn travels.   Time 12   Period Weeks   Status Achieved     PT LONG TERM GOAL  #11   TITLE Pt will be able to adduct and flex hip in supine, standing hip flex/ext/ abduction/adduction, seated hip flex/abd/ER with co-activation with pelvic floor without pain and with functional ROM in order to seat, stand, and walk on out of town trips     Time 12   Period Weeks   Status Partially Met               Plan - 05/02/16 0904    Clinical Impression Statement Pt reports 75-80% improvement with her relapsed pain following manual Tx that  helped to increase sacral nutation and posterior glide of iliac crest over sacrum. Addressed limited mobility of SIJ on R and modified exercsies to promote hip ext, glut strengthening in prone, and discontinued seated  exercises to decrease sacral counternutation. Pt continues to benefit from skilled PT    Rehab Potential Good   PT Frequency 2x / week   PT Duration 12 weeks   PT Treatment/Interventions ADLs/Self Care Home Management;Aquatic Therapy;Gait training;Traction;Moist Heat;Stair training;Functional mobility training;Therapeutic activities;Therapeutic exercise;Balance training;Neuromuscular re-education;Electrical Stimulation;Cryotherapy;Manual techniques;Patient/family education;Passive range of motion;Dry needling;Energy conservation   Consulted and Agree with Plan of Care Patient      Patient will benefit from skilled therapeutic intervention in order to improve the following deficits and impairments:  Abnormal gait, Pain, Improper body mechanics, Postural dysfunction, Other (comment), Decreased mobility, Decreased coordination, Decreased activity tolerance, Decreased endurance, Decreased balance, Decreased safety awareness, Impaired flexibility, Decreased range of motion, Obesity, Decreased strength, Cardiopulmonary status limiting activity, Difficulty walking, Decreased scar mobility  Visit Diagnosis: Myalgia  Poor posture  Other abnormalities of gait and mobility     Problem List Patient Active Problem List   Diagnosis Date Noted  . Cholecystitis 08/21/2015  . Medicare annual wellness visit, subsequent 07/24/2015  . Cerebrovascular small vessel disease 04/24/2015  . Benign paroxysmal positional vertigo 04/24/2015  . Pain in joint, pelvic region and thigh 04/24/2015  . Counseling for travel 01/11/2015  . Shoulder pain, right 11/22/2013  . Statin intolerance 10/08/2012  . Edema 05/14/2012  . Generalized anxiety disorder 01/06/2012  . 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 Hypothyroidism 12/18/2010  . Low back pain radiating to both legs 12/18/2010  . Hip pain, right 12/18/2010  . Obesity 05/09/2010  . CAD (coronary artery disease) 05/09/2010  . Hyperlipidemia 05/09/2010     Jerl Mina ,PT, DPT, E-RYT  05/02/2016, 9:07 AM  [place] MAIN [facility] SERVICES [address] [place], [place], [address] Phone: [phone]   Fax:  [phone]  Name: Andersyn Fragoso MRN: [id number] Date of Birth: March 01, 1940

## 2016-05-07 ENCOUNTER — Ambulatory Visit: Payer: PPO | Attending: Internal Medicine | Admitting: Physical Therapy

## 2016-05-07 DIAGNOSIS — R293 Abnormal posture: Secondary | ICD-10-CM

## 2016-05-07 DIAGNOSIS — M791 Myalgia, unspecified site: Secondary | ICD-10-CM

## 2016-05-07 DIAGNOSIS — R2689 Other abnormalities of gait and mobility: Secondary | ICD-10-CM | POA: Insufficient documentation

## 2016-05-07 NOTE — Therapy (Signed)
[place] MAIN [facility] SERVICES [address] [place], [place], [address] Phone: [phone]   Fax:  [phone]  Physical Therapy Treatment  Patient Details  Name: Meagan Cox MRN: [id number] Date of Birth: 1940-12-24 Referring Provider: Derrel Nip MD  Encounter Date: 05/07/2016      PT End of Session - 05/07/16 1506    Visit Number 36   Date for PT Re-Evaluation 06/21/16   Authorization Type 6 g-code   PT Start Time 1400   PT Stop Time 1505   PT Time Calculation (min) 65 min   Activity Tolerance No increased pain;Patient tolerated treatment well   Behavior During Therapy [facility] for tasks assessed/performed      Past Medical History:  Diagnosis Date  . Hyperlipidemia   . Hypertension   . Hypothyroidism     Past Surgical History:  Procedure Laterality Date  . ABDOMINAL HYSTERECTOMY    . CARDIAC CATHETERIZATION     30 years ago, [place]  . CHOLECYSTECTOMY N/A 08/21/2015   Procedure: LAPAROSCOPIC CHOLECYSTECTOMY;  Surgeon: Jules Husbands, MD;  Location: [facility] ORS;  Service: General;  Laterality: N/A;  . PARTIAL HYSTERECTOMY    . TONSILLECTOMY      There were no vitals filed for this visit.      Subjective Assessment - 05/07/16 1500    Subjective Pt reoprts not needing to take a tylenol after last session 5 days ago. The pain has not worsened nor improved.    Pertinent History R medial thigh pain: Pt has trouble sleeping  on L side due to L arm pain  and tries to sleep on her back and belly.  Pt has difficulty sleeping on R  side due to R hip bursitis. Hx of R knee issues (meniscus "slipping out out place" and chriporactor helps to realign). Resolved for the past 1-2 years.   Gynecological: 3 vaginal birth,  without perineal trauma.  SUI . Denied saddle signs.  Pt travels to [place] 2x a year. Pt will be leaving in August for her next trip.     Patient Stated Goals standing long periods at the airport and traveling with less pain.  "Quit hurting."            [facility] PT Assessment - 05/07/16 1501      Observation/Other Assessments   Observations pre Tx: wincing and unable to lift R ankle mid calf level,  post Tx: ankle to knee with trunk lean posteriorly      AROM   AROM Assessment Site --  sidelying L,R heel slides w/cued hip ER/ abduction no postTx     Palpation   Palpation comment minor tensions over the medial thigh to mid thigh level, and at the obturator rami                      [facility] Adult PT Treatment/Exercise - 05/07/16 1503      Therapeutic Activites    Therapeutic Activities --  see pt instructions     Neuro Re-ed    Neuro Re-ed Details  keep R knee out with heel slides, seated ankle crossing      Manual Therapy   Internal Pelvic Floor STM, MWM in sidelying L and fascial trelease medial thigh R                 PT Education - 05/07/16 1506    Education provided Yes   Education Details HEP   Person(s) Educated Patient  Methods Explanation;Demonstration;Tactile cues;Verbal cues;Handout   Comprehension Returned demonstration;Verbalized understanding             PT Long Term Goals - 04/30/16 0921      PT LONG TERM GOAL #1   Title Pt will decrease her ODI score from 24% to < 20% in order to travel on her trips to [place] and walk long distances on uneven surfaces.  (09/26/15: 12%)    Time 12   Period Weeks   Status Achieved     PT LONG TERM GOAL #2   Title Pt will decrease her PSQI score from 29% to < 24% in order to improve sleep quality and demonstrate ability to lay in a comfortable position.  (1%)   Time 12   Period Weeks   Status Achieved     PT LONG TERM GOAL #3   Title Pt will be complaint with water intake in order to promote bladder health and hydration for muscles for walking.    Time 12   Status Achieved     PT LONG TERM GOAL #4   Title Pt will demo no pelvic asymmetries and increased hip PROM IR bilaterally > 10 deg in order to improve gait  mechanics for ambulation.    Time 12   Period Weeks   Status Achieved     PT LONG TERM GOAL #5   Title Pt will demo no pain with sidestepping L with yellow resistive band at thigh across 5 steps in order to perform exercises and to return to walking longer distances and maintain proper alignment of hips and LE.   Time 12   Period Weeks   Status Achieved     PT LONG TERM GOAL #6   Title Pt will demo IND with HEP   Time 12   Period Weeks   Status Partially Met     PT LONG TERM GOAL #7   Title Pt will demo decreased thoracic/ lumbar/ gluteal mm tensions B and report no reoccurence of pain at R glut and medial to ASIS/inguinal area across 4 weeks in order to demo IND with self-maintenence to m inimize relapse of pain.    Time 12   Period Weeks   Status Achieved     PT LONG TERM GOAL #8   Title Pt will be able to perform hip abd/ER with R heel over L thigh in supine in order to demo less pain and to perform higher functional fitness exercises to minimize relapse of pain   Time 12   Period Weeks   Status Achieved     PT LONG TERM GOAL  #9   TITLE Pt will report to be able lay on her R side to sleep in order to improve QOL   Time 12   Period Weeks   Status On-going     PT LONG TERM GOAL  #10   TITLE Pt will demo less gait deviations ( heel striking, short stride, decreased hip flexion) across 2 visits without cuing in order to walk with improved mechanics and minimize risks for relapse of pain and to go on foriegn travels.   Time 12   Period Weeks   Status Achieved     PT LONG TERM GOAL  #11   TITLE Pt will be able to adduct and flex hip in supine, standing hip flex/ext/ abduction/adduction, seated hip flex/abd/ER with co-activation with pelvic floor without pain and with functional ROM in order to seat, stand, and walk on out of  town trips     Time 12   Period Weeks   Status Partially Met               Plan - 05/07/16 1533    Clinical Impression Statement Pt  reported no change in the tightness in her buttocks. Pt did not have to take tylenol after last session which indicates she is tolerating her pain more compared to past session. Palpation at the obturator rami R reproduced pain in the anteromedial thigh. This decreased post Tx and pt was able to demo increased hip abduction/ ER to raise ankle to knee level which is an improvement towards her goal to to be able to cross her ankle over thigh to don shoes. Pt was progressed to sidelying heel slides with hip ER/ abd  which pt toelrated without pain. Pt also showed increased hip flexion with gait with less sweeping of her feet and no cuing. Plan to continue to targeting sacrospinous ligment to faciliate more arthrokinematic SIJ mobility and decrease fascial restrictions over medial thigh. Anticiapte pt will continue to prgress towards goals with skilled PT.       Rehab Potential Good   PT Frequency 2x / week   PT Duration 12 weeks   PT Treatment/Interventions ADLs/Self Care Home Management;Aquatic Therapy;Gait training;Traction;Moist Heat;Stair training;Functional mobility training;Therapeutic activities;Therapeutic exercise;Balance training;Neuromuscular re-education;Electrical Stimulation;Cryotherapy;Manual techniques;Patient/family education;Passive range of motion;Dry needling;Energy conservation   Consulted and Agree with Plan of Care Patient      Patient will benefit from skilled therapeutic intervention in order to improve the following deficits and impairments:  Abnormal gait, Pain, Improper body mechanics, Postural dysfunction, Other (comment), Decreased mobility, Decreased coordination, Decreased activity tolerance, Decreased endurance, Decreased balance, Decreased safety awareness, Impaired flexibility, Decreased range of motion, Obesity, Decreased strength, Cardiopulmonary status limiting activity, Difficulty walking, Decreased scar mobility  Visit Diagnosis: Myalgia  Poor posture  Other  abnormalities of gait and mobility     Problem List Patient Active Problem List   Diagnosis Date Noted  . Cholecystitis 08/21/2015  . Medicare annual wellness visit, subsequent 07/24/2015  . Cerebrovascular small vessel disease 04/24/2015  . Benign paroxysmal positional vertigo 04/24/2015  . Pain in joint, pelvic region and thigh 04/24/2015  . Counseling for travel 01/11/2015  . Shoulder pain, right 11/22/2013  . Statin intolerance 10/08/2012  . Edema 05/14/2012  . Generalized anxiety disorder 01/06/2012  . 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 Hypothyroidism 12/18/2010  . Low back pain radiating to both legs 12/18/2010  . Hip pain, right 12/18/2010  . Obesity 05/09/2010  . CAD (coronary artery disease) 05/09/2010  . Hyperlipidemia 05/09/2010    Jerl Mina ,PT, DPT, E-RYT  05/07/2016, 3:49 PM  [place] MAIN [facility] SERVICES [address] [place], [place], [address] Phone: [phone]   Fax:  [phone]  Name: Marise Knapper MRN: [id number] Date of Birth: 1940/04/13

## 2016-05-07 NOTE — Patient Instructions (Signed)
sidelying L   Pillow between knees and calves Heel slides with R knee pointing up to the sky  10 x 3 reps x 3     _______  Stretch quad in chair 45 deg turn , R toes tucked under by the chair leg

## 2016-05-09 ENCOUNTER — Encounter: Payer: PPO | Admitting: Physical Therapy

## 2016-05-09 ENCOUNTER — Ambulatory Visit: Payer: PPO | Admitting: Physical Therapy

## 2016-05-09 DIAGNOSIS — M791 Myalgia, unspecified site: Secondary | ICD-10-CM

## 2016-05-09 DIAGNOSIS — R2689 Other abnormalities of gait and mobility: Secondary | ICD-10-CM

## 2016-05-09 DIAGNOSIS — R293 Abnormal posture: Secondary | ICD-10-CM

## 2016-05-09 NOTE — Therapy (Signed)
[place] MAIN [facility] SERVICES [address] [place], [place], [address] Phone: [phone]   Fax:  [phone]  Physical Therapy Treatment  Patient Details  Name: Meagan Cox MRN: [id number] Date of Birth: December 05, 1940 Referring Provider: Derrel Nip MD  Encounter Date: 05/09/2016      PT End of Session - 05/09/16 1416    Visit Number 37   Date for PT Re-Evaluation 06/21/16   Authorization Type 7 g-code   PT Start Time 1005   PT Stop Time 1105   PT Time Calculation (min) 60 min   Activity Tolerance No increased pain;Patient tolerated treatment well   Behavior During Therapy [facility] for tasks assessed/performed      Past Medical History:  Diagnosis Date  . Hyperlipidemia   . Hypertension   . Hypothyroidism     Past Surgical History:  Procedure Laterality Date  . ABDOMINAL HYSTERECTOMY    . CARDIAC CATHETERIZATION     30 years ago, [place]  . CHOLECYSTECTOMY N/A 08/21/2015   Procedure: LAPAROSCOPIC CHOLECYSTECTOMY;  Surgeon: Jules Husbands, MD;  Location: [facility] ORS;  Service: General;  Laterality: N/A;  . PARTIAL HYSTERECTOMY    . TONSILLECTOMY      There were no vitals filed for this visit.      Subjective Assessment - 05/09/16 1011    Subjective Pt reports the pain has decreased by 50% since last session . Leaning forward hurts with sit to stand.    Pertinent History R medial thigh pain: Pt has trouble sleeping  on L side due to L arm pain  and tries to sleep on her back and belly.  Pt has difficulty sleeping on R  side due to R hip bursitis. Hx of R knee issues (meniscus "slipping out out place" and chriporactor helps to realign). Resolved for the past 1-2 years.   Gynecological: 3 vaginal birth,  without perineal trauma.  SUI . Denied saddle signs.  Pt travels to [place] 2x a year. Pt will be leaving in August for her next trip.     Patient Stated Goals standing long periods at the airport and traveling with less pain. "Quit  hurting."            [facility] PT Assessment - 05/09/16 1013      Observation/Other Assessments   Observations Leaning back  with R ankle at mid calf on other leg      Functional Tests   Functional tests --  standing tree pose without difficulty     Palpation   Palpation comment tensions at pectineus and minor tensions at sacrospinous (decreased tenderness post Tx)                                   PT Long Term Goals - 04/30/16 5027      PT LONG TERM GOAL #1   Title Pt will decrease her ODI score from 24% to < 20% in order to travel on her trips to [place] and walk long distances on uneven surfaces.  (09/26/15: 12%)    Time 12   Period Weeks   Status Achieved     PT LONG TERM GOAL #2   Title Pt will decrease her PSQI score from 29% to < 24% in order to improve sleep quality and demonstrate ability to lay in a comfortable position.  (1%)   Time 12   Period Weeks   Status  Achieved     PT LONG TERM GOAL #3   Title Pt will be complaint with water intake in order to promote bladder health and hydration for muscles for walking.    Time 12   Status Achieved     PT LONG TERM GOAL #4   Title Pt will demo no pelvic asymmetries and increased hip PROM IR bilaterally > 10 deg in order to improve gait mechanics for ambulation.    Time 12   Period Weeks   Status Achieved     PT LONG TERM GOAL #5   Title Pt will demo no pain with sidestepping L with yellow resistive band at thigh across 5 steps in order to perform exercises and to return to walking longer distances and maintain proper alignment of hips and LE.   Time 12   Period Weeks   Status Achieved     PT LONG TERM GOAL #6   Title Pt will demo IND with HEP   Time 12   Period Weeks   Status Partially Met     PT LONG TERM GOAL #7   Title Pt will demo decreased thoracic/ lumbar/ gluteal mm tensions B and report no reoccurence of pain at R glut and medial to ASIS/inguinal area across 4 weeks in order to  demo IND with self-maintenence to m inimize relapse of pain.    Time 12   Period Weeks   Status Achieved     PT LONG TERM GOAL #8   Title Pt will be able to perform hip abd/ER with R heel over L thigh in supine in order to demo less pain and to perform higher functional fitness exercises to minimize relapse of pain   Time 12   Period Weeks   Status Achieved     PT LONG TERM GOAL  #9   TITLE Pt will report to be able lay on her R side to sleep in order to improve QOL   Time 12   Period Weeks   Status On-going     PT LONG TERM GOAL  #10   TITLE Pt will demo less gait deviations ( heel striking, short stride, decreased hip flexion) across 2 visits without cuing in order to walk with improved mechanics and minimize risks for relapse of pain and to go on foriegn travels.   Time 12   Period Weeks   Status Achieved     PT LONG TERM GOAL  #11   TITLE Pt will be able to adduct and flex hip in supine, standing hip flex/ext/ abduction/adduction, seated hip flex/abd/ER with co-activation with pelvic floor without pain and with functional ROM in order to seat, stand, and walk on out of town trips     Time 12   Period Weeks   Status Partially Met               Plan - 05/09/16 1417    Clinical Impression Statement Pt showed increased ability to perform hip ER/abduction following Tx today. She demo'd raising her R ankle from midcalf level to knee level with hip ER/abduction following Tx . Targeted pectineus mm tightness and sacrospinous ligament mobility/ release.  Progressed pt to [facility] in sidelying hip abd/ER, seated and standing hip abd/ER.  Propioception training to maintain hip ER/abd with sit to stand.   Pt continues to benefit from skilled PT.    Rehab Potential Good   PT Frequency 2x / week   PT Duration 12 weeks   PT Treatment/Interventions  ADLs/Self Care Home Management;Aquatic Therapy;Gait training;Traction;Moist Heat;Stair training;Functional mobility training;Therapeutic  activities;Therapeutic exercise;Balance training;Neuromuscular re-education;Electrical Stimulation;Cryotherapy;Manual techniques;Patient/family education;Passive range of motion;Dry needling;Energy conservation   Consulted and Agree with Plan of Care Patient      Patient will benefit from skilled therapeutic intervention in order to improve the following deficits and impairments:  Abnormal gait, Pain, Improper body mechanics, Postural dysfunction, Other (comment), Decreased mobility, Decreased coordination, Decreased activity tolerance, Decreased endurance, Decreased balance, Decreased safety awareness, Impaired flexibility, Decreased range of motion, Obesity, Decreased strength, Cardiopulmonary status limiting activity, Difficulty walking, Decreased scar mobility  Visit Diagnosis: Myalgia  Other abnormalities of gait and mobility  Poor posture     Problem List Patient Active Problem List   Diagnosis Date Noted  . Cholecystitis 08/21/2015  . Medicare annual wellness visit, subsequent 07/24/2015  . Cerebrovascular small vessel disease 04/24/2015  . Benign paroxysmal positional vertigo 04/24/2015  . Pain in joint, pelvic region and thigh 04/24/2015  . Counseling for travel 01/11/2015  . Shoulder pain, right 11/22/2013  . Statin intolerance 10/08/2012  . Edema 05/14/2012  . Generalized anxiety disorder 01/06/2012  . 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 Hypothyroidism 12/18/2010  . Low back pain radiating to both legs 12/18/2010  . Hip pain, right 12/18/2010  . Obesity 05/09/2010  . CAD (coronary artery disease) 05/09/2010  . Hyperlipidemia 05/09/2010    Jerl Mina ,PT, DPT, E-RYT  05/09/2016, 2:21 PM  [place] MAIN [facility] SERVICES [address] [place], [place], [address] Phone: [phone]   Fax:  [phone]  Name: Meagan Cox MRN: [id number] Date of Birth: Jun 21, 1940

## 2016-05-09 NOTE — Patient Instructions (Addendum)
Four new:  Sidelying: With belt (knee up and knee back) 10 reps each direction Active in other leg and feet edge to the bed   Seated ankle kicks with knee out alternating 30reps with exhale with lifting   Tree pose in standing   ______  Getting up with knees wider from a chair

## 2016-05-15 ENCOUNTER — Ambulatory Visit: Payer: PPO | Admitting: Physical Therapy

## 2016-05-15 DIAGNOSIS — R293 Abnormal posture: Secondary | ICD-10-CM

## 2016-05-15 DIAGNOSIS — M791 Myalgia, unspecified site: Secondary | ICD-10-CM

## 2016-05-15 DIAGNOSIS — R2689 Other abnormalities of gait and mobility: Secondary | ICD-10-CM

## 2016-05-15 NOTE — Therapy (Signed)
[place] MAIN [facility] SERVICES [address] [place], [place], [address] Phone: [phone]   Fax:  [phone]  Physical Therapy Treatment  Patient Details  Name: Meagan Cox MRN: [id number] Date of Birth: October 24, 1940 Referring Provider: Derrel Nip MD  Encounter Date: 05/15/2016      PT End of Session - 05/15/16 2156    Visit Number 38   Date for PT Re-Evaluation 06/21/16   Authorization Type 8 g-code   PT Start Time 1430   PT Stop Time 1550   PT Time Calculation (min) 80 min   Activity Tolerance No increased pain;Patient tolerated treatment well   Behavior During Therapy [facility] for tasks assessed/performed      Past Medical History:  Diagnosis Date  . Hyperlipidemia   . Hypertension   . Hypothyroidism     Past Surgical History:  Procedure Laterality Date  . ABDOMINAL HYSTERECTOMY    . CARDIAC CATHETERIZATION     30 years ago, [place]  . CHOLECYSTECTOMY N/A 08/21/2015   Procedure: LAPAROSCOPIC CHOLECYSTECTOMY;  Surgeon: Jules Husbands, MD;  Location: [facility] ORS;  Service: General;  Laterality: N/A;  . PARTIAL HYSTERECTOMY    . TONSILLECTOMY      There were no vitals filed for this visit.      Subjective Assessment - 05/15/16 1436    Subjective Pt no longer has the pain in the R buttocks/ pulling of the medial thigh and was return to sitting without a problem. Pt noticed the only area that gave her problems this past week was at the upper groin area. Pt left last session feeling okay. Her LLE gave out on her the next day and it has become "grabby" for over 15 times. Pt has had to catch herself for near falls. Pt felt scared when it gave out on her. She tried to do a little of her HEP as long as it did not cause pain.  Pt can feel a "knot" in the groin area when she touches it. This knot has been there since the beginning of her POC. This knot is felt also when she lays on her  Lside.  She feels the knot is a muscle.    Pertinent  History R medial thigh pain: Pt has trouble sleeping  on L side due to L arm pain  and tries to sleep on her back and belly.  Pt has difficulty sleeping on R  side due to R hip bursitis. Hx of R knee issues (meniscus "slipping out out place" and chriporactor helps to realign). Resolved for the past 1-2 years.   Gynecological: 3 vaginal birth,  without perineal trauma.  SUI . Denied saddle signs.  Pt travels to [place] 2x a year. Pt will be leaving in August for her next trip.     Patient Stated Goals standing long periods at the airport and traveling with less pain. "Quit hurting."            [facility] PT Assessment - 05/15/16 1444      AROM   AROM Assessment Site --  hip ER/abd in supine pre Tx: pain, post Tx: decreased     Palpation   Palpation comment no tenderness/ tensions at sacrospinous/ ischial tuberosity area.  fascial restrictions at adductor longus, below AIIS/ inguinal canal.       Internal pelvic floor assessment: increased tensions/ tenderness at R obturator internus               [facility] Adult  PT Treatment/Exercise - 05/15/16 2156      Therapeutic Activites    Therapeutic Activities --  sidestep/ minisquat with red band, cued for alignment     Manual Therapy   Internal Pelvic Floor STM along problem areas                      PT Long Term Goals - 04/30/16 0921      PT LONG TERM GOAL #1   Title Pt will decrease her ODI score from 24% to < 20% in order to travel on her trips to [place] and walk long distances on uneven surfaces.  (09/26/15: 12%)    Time 12   Period Weeks   Status Achieved     PT LONG TERM GOAL #2   Title Pt will decrease her PSQI score from 29% to < 24% in order to improve sleep quality and demonstrate ability to lay in a comfortable position.  (1%)   Time 12   Period Weeks   Status Achieved     PT LONG TERM GOAL #3   Title Pt will be complaint with water intake in order to promote bladder health and hydration for muscles for  walking.    Time 12   Status Achieved     PT LONG TERM GOAL #4   Title Pt will demo no pelvic asymmetries and increased hip PROM IR bilaterally > 10 deg in order to improve gait mechanics for ambulation.    Time 12   Period Weeks   Status Achieved     PT LONG TERM GOAL #5   Title Pt will demo no pain with sidestepping L with yellow resistive band at thigh across 5 steps in order to perform exercises and to return to walking longer distances and maintain proper alignment of hips and LE.   Time 12   Period Weeks   Status Achieved     PT LONG TERM GOAL #6   Title Pt will demo IND with HEP   Time 12   Period Weeks   Status Partially Met     PT LONG TERM GOAL #7   Title Pt will demo decreased thoracic/ lumbar/ gluteal mm tensions B and report no reoccurence of pain at R glut and medial to ASIS/inguinal area across 4 weeks in order to demo IND with self-maintenence to m inimize relapse of pain.    Time 12   Period Weeks   Status Achieved     PT LONG TERM GOAL #8   Title Pt will be able to perform hip abd/ER with R heel over L thigh in supine in order to demo less pain and to perform higher functional fitness exercises to minimize relapse of pain   Time 12   Period Weeks   Status Achieved     PT LONG TERM GOAL  #9   TITLE Pt will report to be able lay on her R side to sleep in order to improve QOL   Time 12   Period Weeks   Status On-going     PT LONG TERM GOAL  #10   TITLE Pt will demo less gait deviations ( heel striking, short stride, decreased hip flexion) across 2 visits without cuing in order to walk with improved mechanics and minimize risks for relapse of pain and to go on foriegn travels.   Time 12   Period Weeks   Status Achieved     PT LONG TERM GOAL  #11  TITLE Pt will be able to adduct and flex hip in supine, standing hip flex/ext/ abduction/adduction, seated hip flex/abd/ER with co-activation with pelvic floor without pain and with functional ROM in order to  seat, stand, and walk on out of town trips     Time 12   Period Weeks   Status Partially Met               Plan - 05/15/16 2157    Clinical Impression Statement Pt reported relapse of the catching sensation of her RLE multiple times. Upon assessment today, pt showed good carry over of decreased tenderness/ tensions of gluts/ sacrospinous area/ medial thigh.  However, pt showed a return of fascial tensions along inguinal area, internal pelvic floor mm tensions of obturator internus , and adductor longus. Pt also reported she has not been able to perform hip abductor/ ER strengthening exercises due to disinterest and pain. Following Tx today, pt reported feeling better and the problem areas having less tensions.   Abductor/ ER exercises  helped to resolve the tenderness/ tensions in the same problem areas in prior visits. Return to these exercises will help to bring balance to overuse of adductors. Pt continues to show significantly less genu valgus. Due to pain and interest with monster walk exercise, this exercise was discontinued and replaced with side-step/ minisquat with propioception training. Pt continues to benefit from skilled PT. Pt will be leaving for an out of town trip and was educated to maintain compliance to HEP.    Rehab Potential Good   PT Frequency 2x / week   PT Duration 12 weeks   PT Treatment/Interventions ADLs/Self Care Home Management;Aquatic Therapy;Gait training;Traction;Moist Heat;Stair training;Functional mobility training;Therapeutic activities;Therapeutic exercise;Balance training;Neuromuscular re-education;Electrical Stimulation;Cryotherapy;Manual techniques;Patient/family education;Passive range of motion;Dry needling;Energy conservation   Consulted and Agree with Plan of Care Patient      Patient will benefit from skilled therapeutic intervention in order to improve the following deficits and impairments:  Abnormal gait, Pain, Improper body mechanics, Postural  dysfunction, Other (comment), Decreased mobility, Decreased coordination, Decreased activity tolerance, Decreased endurance, Decreased balance, Decreased safety awareness, Impaired flexibility, Decreased range of motion, Obesity, Decreased strength, Cardiopulmonary status limiting activity, Difficulty walking, Decreased scar mobility  Visit Diagnosis: Myalgia  Other abnormalities of gait and mobility  Poor posture     Problem List Patient Active Problem List   Diagnosis Date Noted  . Cholecystitis 08/21/2015  . Medicare annual wellness visit, subsequent 07/24/2015  . Cerebrovascular small vessel disease 04/24/2015  . Benign paroxysmal positional vertigo 04/24/2015  . Pain in joint, pelvic region and thigh 04/24/2015  . Counseling for travel 01/11/2015  . Shoulder pain, right 11/22/2013  . Statin intolerance 10/08/2012  . Edema 05/14/2012  . Generalized anxiety disorder 01/06/2012  . 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 Hypothyroidism 12/18/2010  . Low back pain radiating to both legs 12/18/2010  . Hip pain, right 12/18/2010  . Obesity 05/09/2010  . CAD (coronary artery disease) 05/09/2010  . Hyperlipidemia 05/09/2010    Jerl Mina 05/15/2016, 10:05 PM  [place] [facility] [address] [facility] SERVICES [address] [place], [place], [address] Phone: [phone]   Fax:  [phone]  Name: Tabbitha Janvrin MRN: [id number] Date of Birth: October 16, 1940

## 2016-05-15 NOTE — Patient Instructions (Signed)
Side step  10 steps L and 10 steps R with red band and mini squat in between each step  3 sets.

## 2016-05-16 ENCOUNTER — Ambulatory Visit: Payer: PPO | Admitting: Physical Therapy

## 2016-05-16 DIAGNOSIS — M791 Myalgia, unspecified site: Secondary | ICD-10-CM

## 2016-05-16 DIAGNOSIS — R2689 Other abnormalities of gait and mobility: Secondary | ICD-10-CM

## 2016-05-16 DIAGNOSIS — R293 Abnormal posture: Secondary | ICD-10-CM

## 2016-05-16 NOTE — Therapy (Signed)
[place] MAIN [facility] SERVICES [address] [place], [place], [address] Phone: [phone]   Fax:  [phone]  Physical Therapy Treatment  Patient Details  Name: Meagan Cox MRN: [id number] Date of Birth: April 20, 1940 Referring Provider: Derrel Nip MD  Encounter Date: 05/16/2016      PT End of Session - 05/16/16 1456    Visit Number 39   Date for PT Re-Evaluation 06/21/16   Authorization Type 9 g-code   PT Start Time 1400   PT Stop Time 1500   PT Time Calculation (min) 60 min   Activity Tolerance No increased pain;Patient tolerated treatment well   Behavior During Therapy [facility] for tasks assessed/performed      Past Medical History:  Diagnosis Date  . Hyperlipidemia   . Hypertension   . Hypothyroidism     Past Surgical History:  Procedure Laterality Date  . ABDOMINAL HYSTERECTOMY    . CARDIAC CATHETERIZATION     30 years ago, [place]  . CHOLECYSTECTOMY N/A 08/21/2015   Procedure: LAPAROSCOPIC CHOLECYSTECTOMY;  Surgeon: Jules Husbands, MD;  Location: [facility] ORS;  Service: General;  Laterality: N/A;  . PARTIAL HYSTERECTOMY    . TONSILLECTOMY      There were no vitals filed for this visit.      Subjective Assessment - 05/16/16 1443    Subjective Since yesterday's appt, pt felt sore everywhere on her R side. When she woke up in teh morning, she did not hurt. The leg caught her along the middle of the thigh when she was at the grocery store.    Pertinent History R medial thigh pain: Pt has trouble sleeping  on L side due to L arm pain  and tries to sleep on her back and belly.  Pt has difficulty sleeping on R  side due to R hip bursitis. Hx of R knee issues (meniscus "slipping out out place" and chriporactor helps to realign). Resolved for the past 1-2 years.   Gynecological: 3 vaginal birth,  without perineal trauma.  SUI . Denied saddle signs.  Pt travels to [place] 2x a year. Pt will be leaving in August for her next trip.     Patient Stated Goals standing long periods at the airport and traveling with less pain. "Quit hurting."            [facility] PT Assessment - 05/16/16 1444      AROM   Overall AROM Comments achieved hip ER/ abd in supine with ankle to L knee post Tx      Palpation   Palpation comment tensions at ischial tubersity, adductor longus, and inguinal area of RLE. ( post session, decreased)                       [facility] Adult PT Treatment/Exercise - 05/16/16 1454      Moist Heat Therapy   Number Minutes Moist Heat 8 Minutes   Moist Heat Location --  SIJ     Manual Therapy   Internal Pelvic Floor long axis distraction on RLE, manual lymph drainage--> SMT along problem areas.  fascial massage over low abdomen to decrease the groin tightness                PT Education - 05/16/16 1454    Education provided Yes   Education Details HEP   Person(s) Educated Patient   Methods Explanation;Demonstration;Tactile cues;Verbal cues;Handout  PT Long Term Goals - 04/30/16 3428      PT LONG TERM GOAL #1   Title Pt will decrease her ODI score from 24% to < 20% in order to travel on her trips to [place] and walk long distances on uneven surfaces.  (09/26/15: 12%)    Time 12   Period Weeks   Status Achieved     PT LONG TERM GOAL #2   Title Pt will decrease her PSQI score from 29% to < 24% in order to improve sleep quality and demonstrate ability to lay in a comfortable position.  (1%)   Time 12   Period Weeks   Status Achieved     PT LONG TERM GOAL #3   Title Pt will be complaint with water intake in order to promote bladder health and hydration for muscles for walking.    Time 12   Status Achieved     PT LONG TERM GOAL #4   Title Pt will demo no pelvic asymmetries and increased hip PROM IR bilaterally > 10 deg in order to improve gait mechanics for ambulation.    Time 12   Period Weeks   Status Achieved     PT LONG TERM GOAL #5   Title Pt will demo no  pain with sidestepping L with yellow resistive band at thigh across 5 steps in order to perform exercises and to return to walking longer distances and maintain proper alignment of hips and LE.   Time 12   Period Weeks   Status Achieved     PT LONG TERM GOAL #6   Title Pt will demo IND with HEP   Time 12   Period Weeks   Status Partially Met     PT LONG TERM GOAL #7   Title Pt will demo decreased thoracic/ lumbar/ gluteal mm tensions B and report no reoccurence of pain at R glut and medial to ASIS/inguinal area across 4 weeks in order to demo IND with self-maintenence to m inimize relapse of pain.    Time 12   Period Weeks   Status Achieved     PT LONG TERM GOAL #8   Title Pt will be able to perform hip abd/ER with R heel over L thigh in supine in order to demo less pain and to perform higher functional fitness exercises to minimize relapse of pain   Time 12   Period Weeks   Status Achieved     PT LONG TERM GOAL  #9   TITLE Pt will report to be able lay on her R side to sleep in order to improve QOL   Time 12   Period Weeks   Status On-going     PT LONG TERM GOAL  #10   TITLE Pt will demo less gait deviations ( heel striking, short stride, decreased hip flexion) across 2 visits without cuing in order to walk with improved mechanics and minimize risks for relapse of pain and to go on foriegn travels.   Time 12   Period Weeks   Status Achieved     PT LONG TERM GOAL  #11   TITLE Pt will be able to adduct and flex hip in supine, standing hip flex/ext/ abduction/adduction, seated hip flex/abd/ER with co-activation with pelvic floor without pain and with functional ROM in order to seat, stand, and walk on out of town trips     Time 12   Period Weeks   Status Partially Met  Plan - 05/16/16 1457    Clinical Impression Statement Pt demo'd decreased facial and muscle tenderness and tensions in the R adductor along medial thigh and ischial tuberosity/ rami  following Tx. Pt regained hip abduction and ER of RLE in supine and was able to slide R heel to the level of L knee. Suspecting overuse of adductors and decreased hip abduction strengthening is the cause for relapse of limited ROM. Plan to continue hip abduction strengthening.  Pt agreed to get an XRay / MRI after returning from her trip.  Pt was educated on HEP and use of heat the during the trip to manage pain/ relapse.   Pt continues to benefit from skilled PT. Pt was not able to perform deep core level 3 ( hip flexion) without cramping in the groin. Suspect pt would benefit from hip flexor strengthening at next session now that she has demo'd increased hip abduction strength/ ROM and less genu valgus.      Rehab Potential Good   PT Frequency 2x / week   PT Duration 12 weeks   PT Treatment/Interventions ADLs/Self Care Home Management;Aquatic Therapy;Gait training;Traction;Moist Heat;Stair training;Functional mobility training;Therapeutic activities;Therapeutic exercise;Balance training;Neuromuscular re-education;Electrical Stimulation;Cryotherapy;Manual techniques;Patient/family education;Passive range of motion;Dry needling;Energy conservation   Consulted and Agree with Plan of Care Patient      Patient will benefit from skilled therapeutic intervention in order to improve the following deficits and impairments:  Abnormal gait, Pain, Improper body mechanics, Postural dysfunction, Other (comment), Decreased mobility, Decreased coordination, Decreased activity tolerance, Decreased endurance, Decreased balance, Decreased safety awareness, Impaired flexibility, Decreased range of motion, Obesity, Decreased strength, Cardiopulmonary status limiting activity, Difficulty walking, Decreased scar mobility  Visit Diagnosis: Myalgia  Other abnormalities of gait and mobility  Poor posture     Problem List Patient Active Problem List   Diagnosis Date Noted  . Cholecystitis 08/21/2015  . Medicare  annual wellness visit, subsequent 07/24/2015  . Cerebrovascular small vessel disease 04/24/2015  . Benign paroxysmal positional vertigo 04/24/2015  . Pain in joint, pelvic region and thigh 04/24/2015  . Counseling for travel 01/11/2015  . Shoulder pain, right 11/22/2013  . Statin intolerance 10/08/2012  . Edema 05/14/2012  . Generalized anxiety disorder 01/06/2012  . 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 Hypothyroidism 12/18/2010  . Low back pain radiating to both legs 12/18/2010  . Hip pain, right 12/18/2010  . Obesity 05/09/2010  . CAD (coronary artery disease) 05/09/2010  . Hyperlipidemia 05/09/2010    Jerl Mina ,PT, DPT, E-RYT  05/16/2016, 2:57 PM  [place] MAIN [facility] SERVICES [address] [place], [place], [address] Phone: [phone]   Fax:  [phone]  Name: Meagan Cox MRN: [id number] Date of Birth: [birth year]

## 2016-05-16 NOTE — Patient Instructions (Addendum)
Range of motion/ strengthening  On back Ankle slides to L knee pillow under knees 10 x  Bridging  10 sec x 5 reps    Deep core level 2   Triangle massage from groin/ pubic bone to belly button  3x each location x 3 sets  _____  Stretches:  3 way hip ext  Seated knee towards the grounds   ____  Hip abduction strengthening:  Thigh press into hands (isometric hip abduction) 5 sec x 10 reps   Side stepping with red band + mini squat  10 steps by L and 10 steps on R   3 sets   Mini squat 10 reps   _____

## 2016-06-05 ENCOUNTER — Other Ambulatory Visit: Payer: Self-pay | Admitting: Internal Medicine

## 2016-06-05 DIAGNOSIS — M5386 Other specified dorsopathies, lumbar region: Secondary | ICD-10-CM | POA: Diagnosis not present

## 2016-06-05 DIAGNOSIS — M9903 Segmental and somatic dysfunction of lumbar region: Secondary | ICD-10-CM | POA: Diagnosis not present

## 2016-06-08 ENCOUNTER — Ambulatory Visit: Payer: PPO | Attending: Internal Medicine | Admitting: Physical Therapy

## 2016-06-08 DIAGNOSIS — R293 Abnormal posture: Secondary | ICD-10-CM | POA: Insufficient documentation

## 2016-06-08 DIAGNOSIS — R2689 Other abnormalities of gait and mobility: Secondary | ICD-10-CM | POA: Insufficient documentation

## 2016-06-08 DIAGNOSIS — M791 Myalgia, unspecified site: Secondary | ICD-10-CM

## 2016-06-08 NOTE — Therapy (Addendum)
[place] MAIN [facility] SERVICES [address] [place], [place], [address] Phone: [phone]   Fax:  [phone]  Physical Therapy Treatment  Patient Details  Name: Meagan Cox MRN: [id number] Date of Birth: February 07, 1940 No Data Recorded  Encounter Date: 06/08/2016      PT End of Session - 06/10/16 2036    Visit Number 40   Date for PT Re-Evaluation 06/21/16   Authorization Type 10  g-code (submitted)    PT Start Time 1100   PT Stop Time 1203   PT Time Calculation (min) 63 min   Activity Tolerance No increased pain;Patient tolerated treatment well   Behavior During Therapy [facility] for tasks assessed/performed      Past Medical History:  Diagnosis Date  . Hyperlipidemia   . Hypertension   . Hypothyroidism     Past Surgical History:  Procedure Laterality Date  . ABDOMINAL HYSTERECTOMY    . CARDIAC CATHETERIZATION     30 years ago, [place]  . CHOLECYSTECTOMY N/A 08/21/2015   Procedure: LAPAROSCOPIC CHOLECYSTECTOMY;  Surgeon: Jules Husbands, MD;  Location: [facility] ORS;  Service: General;  Laterality: N/A;  . PARTIAL HYSTERECTOMY    . TONSILLECTOMY      There were no vitals filed for this visit.      Subjective Assessment - 06/10/16 2040    Subjective Pt reported she returned from her trip to [place].  Sleeping on the couch, walking up and down stairs, and the long plane ride caused pain in her R leg. Pt took medication to relieve her pain. Pain limited her activities on her trip. Pt rode in a WC and her grandson kept her from falling.  Pt received chiropractic and massage therapy upon returning from her trip. Pt had a relapse prior to the trip and it relapsed further upon riding on the plane. Today, pt feels the the pain in her R buttock and medial thigh which are the same areas she had the pain prior tothe chiropractic and massage treatments. Pt is willing to get imaging done soon because she has two trips coming up in August ([place] and one to [place]).  Pt has avoided putting weight on her R leg after the plane ride and continues to today.          Pertinent History R medial thigh pain: Pt has trouble sleeping  on L side due to L arm pain  and tries to sleep on her back and belly.  Pt has difficulty sleeping on R  side due to R hip bursitis. Hx of R knee issues (meniscus "slipping out out place" and chriporactor helps to realign). Resolved for the past 1-2 years.   Gynecological: 3 vaginal birth,  without perineal trauma.  SUI . Denied saddle signs.  Pt travels to [place] 2x a year. Pt will be leaving in August for her next trip.     Patient Stated Goals standing long periods at the airport and traveling with less pain. "Quit hurting."            [facility] PT Assessment - 06/08/16 1954      Step Down   Comments plan to address step up and step dow nat  next session as pt reported she climbed stairs on her trip     AROM   Overall AROM Comments  unable to hip abd/flex/ER to slide R heel along shin.   supine: pre Tx: pain w/ hip flexion RLE and hip ER  w/ knee ext. Post Tx: no pain while achieving [facility]      Strength   Overall Strength Comments seated hip flexion R 2/5, post Tx: 3/5 with wincing and report of pinching at groin,     Palpation   Palpation comment no tensions at ischial tubersity, adductor longus, and inguinal area of RLE. Tensions/ tenderness noted along L lateral  knee, semitendinosus/ membranosus with referred paing to medial thigh                       [facility] Adult PT Treatment/Exercise - 06/10/16 2040      Neuro Re-ed    Neuro Re-ed Details  minisquats with proper knee alignment     Manual Therapy   Internal Pelvic Floor lateral mob/ distraction of tibia on R, STM along semimembranosus/tendinosus                       PT Long Term Goals - 06/08/16 1959      PT LONG TERM GOAL #1   Title Pt will decrease her ODI score from 24% to < 20% in order to travel on her trips to  [place] and walk long distances on uneven surfaces.  (09/26/15: 12%)    Time 12   Period Weeks   Status Achieved     PT LONG TERM GOAL #2   Title Pt will decrease her PSQI score from 29% to < 24% in order to improve sleep quality and demonstrate ability to lay in a comfortable position.  (1%)   Time 12   Period Weeks   Status Achieved     PT LONG TERM GOAL #3   Title Pt will be complaint with water intake in order to promote bladder health and hydration for muscles for walking.    Time 12   Status Achieved     PT LONG TERM GOAL #4   Title Pt will demo no pelvic asymmetries and increased hip PROM IR bilaterally > 10 deg in order to improve gait mechanics for ambulation.    Time 12   Period Weeks   Status Achieved     PT LONG TERM GOAL #5   Title Pt will demo no pain with sidestepping L with yellow resistive band at thigh across 5 steps in order to perform exercises and to return to walking longer distances and maintain proper alignment of hips and LE.   Time 12   Period Weeks   Status Achieved     Additional Long Term Goals   Additional Long Term Goals Yes     PT LONG TERM GOAL #6   Title Pt will demo IND with HEP   Time 12   Period Weeks   Status Partially Met     PT LONG TERM GOAL #7   Title Pt will demo decreased thoracic/ lumbar/ gluteal mm tensions B and report no reoccurence of pain at R glut and medial to ASIS/inguinal area across 4 weeks in order to demo IND with self-maintenence to m inimize relapse of pain.    Time 12   Period Weeks   Status Achieved     PT LONG TERM GOAL #8   Title Pt will be able to perform hip abd/ER with R heel over L thigh in supine in order to demo less pain and to perform higher functional fitness exercises to minimize relapse of pain   Time 12   Period Weeks   Status Achieved  PT LONG TERM GOAL  #9   TITLE Pt will report to be able lay on her R side to sleep in order to improve QOL   Time 12   Period Weeks   Status On-going      PT LONG TERM GOAL  #10   TITLE Pt will demo less gait deviations ( heel striking, short stride, decreased hip flexion) across 2 visits without cuing in order to walk with improved mechanics and minimize risks for relapse of pain and to go on foriegn travels.   Time 12   Period Weeks   Status Achieved     PT LONG TERM GOAL  #11   TITLE Pt will be able to adduct and flex hip in supine, standing hip flex/ext/ abduction/adduction, seated hip flex/abd/ER with co-activation with pelvic floor without pain and with functional ROM in order to seat, stand, and walk on out of town trips     Time 12   Period Weeks   Status Partially Met     PT LONG TERM GOAL  #12   TITLE Pt will demo no tensions/ tenderness along R semitendinosus/ membranosus at distal attachments in order to maintain proper knee alignment with stairs    Time 12   Period Weeks   Status New               Plan - 06/08/16 1959    Clinical Impression Statement Pt had made improvements in the past months with significantly improved sacroiliac joint/ hip mobility with increased ability to get out of a chair with less genu valgus and gait mechanics with increased hip flexion, stride length, less pronation.  Pt's places of tightness and fascial restrictions have decreased significantly at anterior/medial R thigh, posterior attachments and proximal attachments of adductor mm, obturator internus and pelvic floor mm.   Pt has shown slightly improved genu valgus on RLE as well as hip abduction, flexion, extension strength. However, a month ago, pt experienced a relapse of pain and decreased ROM with hip ER/flex/ABD in seated position. This relapse of pain was further exacerbated by her out of state trip to [place] which involved long hours of sitting (a trigger for previous relapse) and stair navigation. Today, pt showed good carry over with no fascial /soft tissue restrictions at the femoral triangle nor at the obturator rami where there were once  increased tensions. The area of tightness and pain were located at the distal attachment of semimebranosus/ tendinosus which referred to the lower  medial thigh pain that pt had upon arrival. Following manual Tx, pt reported her pain was less. Plan to address her hamstring muscles (eccentric control, especially in descending stairs) and to assess her knee alignment with stair navigation given she performed this activity during her trip and has not been trained in PT yet. Pt continues to have difficulty laying on her R side when sleeping.   Given pt's relapses of pain, PT has asked pt to undergo imaging in the past but pt declined but at this appointment, pt has consented. PT will communicate with referring provider to request an order for an MRI.   Pt will continue to benefit from skilled PT and will refer out based on MRI reports and pt's response if warranted.    Rehab Potential Good   PT Frequency 2x / week   PT Duration 12 weeks   PT Treatment/Interventions ADLs/Self Care Home Management;Aquatic Therapy;Gait training;Traction;Moist Heat;Stair training;Functional mobility training;Therapeutic activities;Therapeutic exercise;Balance training;Neuromuscular re-education;Electrical Stimulation;Cryotherapy;Manual techniques;Patient/family education;Passive range of  motion;Dry needling;Energy conservation   Consulted and Agree with Plan of Care Patient      Patient will benefit from skilled therapeutic intervention in order to improve the following deficits and impairments:  Abnormal gait, Pain, Improper body mechanics, Postural dysfunction, Other (comment), Decreased mobility, Decreased coordination, Decreased activity tolerance, Decreased endurance, Decreased balance, Decreased safety awareness, Impaired flexibility, Decreased range of motion, Obesity, Decreased strength, Cardiopulmonary status limiting activity, Difficulty walking, Decreased scar mobility  Visit Diagnosis: Myalgia  Other  abnormalities of gait and mobility  Poor posture     Problem List Patient Active Problem List   Diagnosis Date Noted  . Cholecystitis 08/21/2015  . Medicare annual wellness visit, subsequent 07/24/2015  . Cerebrovascular small vessel disease 04/24/2015  . Benign paroxysmal positional vertigo 04/24/2015  . Pain in joint, pelvic region and thigh 04/24/2015  . Counseling for travel 01/11/2015  . Shoulder pain, right 11/22/2013  . Statin intolerance 10/08/2012  . Edema 05/14/2012  . Generalized anxiety disorder 01/06/2012  . 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 Hypothyroidism 12/18/2010  . Low back pain radiating to both legs 12/18/2010  . Hip pain, right 12/18/2010  . Obesity 05/09/2010  . CAD (coronary artery disease) 05/09/2010  . Hyperlipidemia 05/09/2010    Jerl Mina ,PT, DPT, E-RYT  06/10/2016, 8:41 PM  [place] MAIN [facility] SERVICES [address] [place], [place], [address] Phone: [phone]   Fax:  [phone]  Name: Meagan Cox MRN: [id number] Date of Birth: February 28, 1940

## 2016-06-11 ENCOUNTER — Ambulatory Visit: Payer: PPO | Admitting: Physical Therapy

## 2016-06-11 ENCOUNTER — Other Ambulatory Visit: Payer: Self-pay | Admitting: Internal Medicine

## 2016-06-11 ENCOUNTER — Encounter: Payer: Self-pay | Admitting: Internal Medicine

## 2016-06-11 DIAGNOSIS — M25551 Pain in right hip: Secondary | ICD-10-CM

## 2016-06-11 DIAGNOSIS — M791 Myalgia, unspecified site: Secondary | ICD-10-CM

## 2016-06-11 DIAGNOSIS — M25561 Pain in right knee: Secondary | ICD-10-CM

## 2016-06-11 DIAGNOSIS — R293 Abnormal posture: Secondary | ICD-10-CM

## 2016-06-11 DIAGNOSIS — R2689 Other abnormalities of gait and mobility: Secondary | ICD-10-CM

## 2016-06-11 NOTE — Therapy (Signed)
[place] MAIN [facility] SERVICES [address] [place], [place], [address] Phone: [phone]   Fax:  [phone]  Physical Therapy Treatment  Patient Details  Name: Meagan Cox MRN: [id number] Date of Birth: Jan 26, 1941 No Data Recorded  Encounter Date: 06/11/2016      PT End of Session - 06/11/16 1659    Visit Number 41   Date for PT Re-Evaluation 06/21/16   Authorization Type 1  g-code    PT Start Time 1402   PT Stop Time 1505   PT Time Calculation (min) 63 min   Activity Tolerance No increased pain;Patient tolerated treatment well   Behavior During Therapy [facility] for tasks assessed/performed      Past Medical History:  Diagnosis Date  . Hyperlipidemia   . Hypertension   . Hypothyroidism     Past Surgical History:  Procedure Laterality Date  . ABDOMINAL HYSTERECTOMY    . CARDIAC CATHETERIZATION     30 years ago, [place]  . CHOLECYSTECTOMY N/A 08/21/2015   Procedure: LAPAROSCOPIC CHOLECYSTECTOMY;  Surgeon: Jules Husbands, MD;  Location: [facility] ORS;  Service: General;  Laterality: N/A;  . PARTIAL HYSTERECTOMY    . TONSILLECTOMY      There were no vitals filed for this visit.      Subjective Assessment - 06/11/16 1408    Subjective Pt reported she was sore after leaving last session 2 days ago. Pt has been able to get up today after resting all weekend. She had felt soreness in her calf in the middle of the night one night prior to seeking her DC and LBMT last week. It has resolved by 75% and feels like the soreness as decreased. Today she feels pain in her medial thigh when she puts weight on her RLE. When she straightens up from sit to stand, she feels catching in the groin. The pain in the buttocks has been constant with burning type sensation. Pt is walking better today after the soreness has decreased in the medial knee and along the lower anterior thigh.     Pertinent History R medial thigh pain: Pt has trouble sleeping   on L side due to L arm pain  and tries to sleep on her back and belly.  Pt has difficulty sleeping on R  side due to R hip bursitis. Hx of R knee issues (meniscus "slipping out out place" and chriporactor helps to realign). Resolved for the past 1-2 years.   Gynecological: 3 vaginal birth,  without perineal trauma.  SUI . Denied saddle signs.  Pt travels to [place] 2x a year. Pt will be leaving in August for her next trip.     Patient Stated Goals standing long periods at the airport and traveling with less pain. "Quit hurting."            [facility] PT Assessment - 06/11/16 1646      Sit to Stand   Comments pre Tx: decreased WBing on RLE, heavy use of BUE. pain at inguinal area  postTx: no use of BUE and no pain (sucessful Tx with taping following manual Tx )      Other:   Other/ Comments stairs: pre Tx: heavy use of BUE on rails, pain with R hip flexion ~45 deg , heavy use of BUE on descent,  Post Tx:  asecnding/descending stairs without pain , single UEo n rail      Palpation   Palpation comment palpation at medioposterior aspect of tibia on  R LE referred to medial thigh pain and ischial tuberosity  ( post Tx: no referred nor localized pain)      Bed Mobility   Bed Mobility --  less wincing pain,got up from bed w/ less pain post Tx                     [facility] Adult PT Treatment/Exercise - 06/11/16 1655      Manual Therapy   Internal Pelvic Floor dstraction of R tibia Grade III 15 bouts at 5 sec, medial to laterial glide of tibia Grade IIII 30 sec bouts x 3. STM along insertion of semimembranosus,      Kinesiotix   Facilitate Muscle  into hip ER, diagonal direction from medial aspecto fo tibia upward to lateral tight 2 strips, horizontal strip at upper 1/3 of thigh                 PT Education - 06/11/16 1658    Education provided Yes   Education Details HEP, education about DVT, signs and when to seek immediate medical attention    Person(s) Educated Patient    Methods Explanation;Demonstration;Tactile cues;Verbal cues   Comprehension Returned demonstration;Verbalized understanding             PT Long Term Goals - 06/08/16 1959      PT LONG TERM GOAL #1   Title Pt will decrease her ODI score from 24% to < 20% in order to travel on her trips to [place] and walk long distances on uneven surfaces.  (09/26/15: 12%)    Time 12   Period Weeks   Status Achieved     PT LONG TERM GOAL #2   Title Pt will decrease her PSQI score from 29% to < 24% in order to improve sleep quality and demonstrate ability to lay in a comfortable position.  (1%)   Time 12   Period Weeks   Status Achieved     PT LONG TERM GOAL #3   Title Pt will be complaint with water intake in order to promote bladder health and hydration for muscles for walking.    Time 12   Status Achieved     PT LONG TERM GOAL #4   Title Pt will demo no pelvic asymmetries and increased hip PROM IR bilaterally > 10 deg in order to improve gait mechanics for ambulation.    Time 12   Period Weeks   Status Achieved     PT LONG TERM GOAL #5   Title Pt will demo no pain with sidestepping L with yellow resistive band at thigh across 5 steps in order to perform exercises and to return to walking longer distances and maintain proper alignment of hips and LE.   Time 12   Period Weeks   Status Achieved     Additional Long Term Goals   Additional Long Term Goals Yes     PT LONG TERM GOAL #6   Title Pt will demo IND with HEP   Time 12   Period Weeks   Status Partially Met     PT LONG TERM GOAL #7   Title Pt will demo decreased thoracic/ lumbar/ gluteal mm tensions B and report no reoccurence of pain at R glut and medial to ASIS/inguinal area across 4 weeks in order to demo IND with self-maintenence to m inimize relapse of pain.    Time 12   Period Weeks   Status Achieved     PT LONG  TERM GOAL #8   Title Pt will be able to perform hip abd/ER with R heel over L thigh in supine in order to demo  less pain and to perform higher functional fitness exercises to minimize relapse of pain   Time 12   Period Weeks   Status Achieved     PT LONG TERM GOAL  #9   TITLE Pt will report to be able lay on her R side to sleep in order to improve QOL   Time 12   Period Weeks   Status On-going     PT LONG TERM GOAL  #10   TITLE Pt will demo less gait deviations ( heel striking, short stride, decreased hip flexion) across 2 visits without cuing in order to walk with improved mechanics and minimize risks for relapse of pain and to go on foriegn travels.   Time 12   Period Weeks   Status Achieved     PT LONG TERM GOAL  #11   TITLE Pt will be able to adduct and flex hip in supine, standing hip flex/ext/ abduction/adduction, seated hip flex/abd/ER with co-activation with pelvic floor without pain and with functional ROM in order to seat, stand, and walk on out of town trips     Time 12   Period Weeks   Status Partially Met     PT LONG TERM GOAL  #12   TITLE Pt will demo no tensions/ tenderness along R semitendinosus/ membranosus at distal attachments in order to maintain proper knee alignment with stairs    Time 12   Period Weeks   Status New               Plan - 06/11/16 1702    Clinical Impression Statement Pt tolerated last session and reported the soreness near her medial knee has improved today. Pt showed difficulty with sit to stand and ascending/descending stairs pre Tx. Pt showed less difficulty and pain post Tx which involved manual Tx and kinesiotaping with these activities. Anticipate to start pt back on exercises to promote R   hip abd/ER which the kinesiotape faciliated. Also plan to plan to strengthen hamstrings and continue with promoting better arthrokinematic mobility at R femur/tibia joint. Pt continues to benefit from skilled PT. Pt was educated on differentiating between DVT, cramps, and soreness in her legs and when to seek immediate medical attention.      Rehab  Potential Good   PT Frequency 2x / week   PT Duration 12 weeks   PT Treatment/Interventions ADLs/Self Care Home Management;Aquatic Therapy;Gait training;Traction;Moist Heat;Stair training;Functional mobility training;Therapeutic activities;Therapeutic exercise;Balance training;Neuromuscular re-education;Electrical Stimulation;Cryotherapy;Manual techniques;Patient/family education;Passive range of motion;Dry needling;Energy conservation   Consulted and Agree with Plan of Care Patient      Patient will benefit from skilled therapeutic intervention in order to improve the following deficits and impairments:  Abnormal gait, Pain, Improper body mechanics, Postural dysfunction, Other (comment), Decreased mobility, Decreased coordination, Decreased activity tolerance, Decreased endurance, Decreased balance, Decreased safety awareness, Impaired flexibility, Decreased range of motion, Obesity, Decreased strength, Cardiopulmonary status limiting activity, Difficulty walking, Decreased scar mobility  Visit Diagnosis: No diagnosis found.     Problem List Patient Active Problem List   Diagnosis Date Noted  . Cholecystitis 08/21/2015  . Medicare annual wellness visit, subsequent 07/24/2015  . Cerebrovascular small vessel disease 04/24/2015  . Benign paroxysmal positional vertigo 04/24/2015  . Pain in joint, pelvic region and thigh 04/24/2015  . Counseling for travel 01/11/2015  . Shoulder pain, right 11/22/2013  .  Statin intolerance 10/08/2012  . Edema 05/14/2012  . Generalized anxiety disorder 01/06/2012  . 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 Hypothyroidism 12/18/2010  . Low back pain radiating to both legs 12/18/2010  . Hip pain, right 12/18/2010  . Obesity 05/09/2010  . CAD (coronary artery disease) 05/09/2010  . Hyperlipidemia 05/09/2010    Jerl Mina ,PT, DPT, E-RYT  06/11/2016, 6:56 PM  [place] MAIN [facility] SERVICES [address]  [place], [place], [address] Phone: [phone]   Fax:  [phone]  Name: Nethra Mehlberg MRN: [id number] Date of Birth: Sep 04, 1940

## 2016-06-18 ENCOUNTER — Ambulatory Visit: Payer: PPO | Admitting: Physical Therapy

## 2016-06-18 ENCOUNTER — Encounter: Payer: Self-pay | Admitting: Internal Medicine

## 2016-06-18 ENCOUNTER — Ambulatory Visit (INDEPENDENT_AMBULATORY_CARE_PROVIDER_SITE_OTHER): Payer: PPO

## 2016-06-18 DIAGNOSIS — M25551 Pain in right hip: Secondary | ICD-10-CM

## 2016-06-18 DIAGNOSIS — M1611 Unilateral primary osteoarthritis, right hip: Secondary | ICD-10-CM | POA: Diagnosis not present

## 2016-06-19 ENCOUNTER — Ambulatory Visit: Payer: PPO | Admitting: Physical Therapy

## 2016-06-19 DIAGNOSIS — R293 Abnormal posture: Secondary | ICD-10-CM

## 2016-06-19 DIAGNOSIS — M791 Myalgia, unspecified site: Secondary | ICD-10-CM

## 2016-06-19 DIAGNOSIS — R2689 Other abnormalities of gait and mobility: Secondary | ICD-10-CM

## 2016-06-19 NOTE — Patient Instructions (Addendum)
Prone Heel Press for strengthening sacro-iliac joints  1. Lie on your belly. If you have an arch in your low back or it feels umcomfortable, place a pillow under your low belly/hips to make sure your low back feel comfortable.   2. Place our forehead on top of your palms.      Widen your knees apart for starting position.   3. Inhale, feel belly and low back expand  4. Exhale, feel belly hug in, press heel together and count aloud for 5 sec. Then relax the heel squeezing.  Perform 10 reps of 5 sec holds. 2 sets/ day.    If you feel entire buttock tighten too much or feel low back pain, apply 50% less effort. As you press your heel together, you will feel as if your pubic bone (front of your pelvis) and sacrum (back of your pelvis) gentle move towards each other or your low abdominal muscles hug in more.        ________  Meagan Cox R ankle behind L  10 reps to stretch the top hip area   ________  Hand rolled towel under R thigh above knee to passive stretch the front of the hips 5 min   ________ For the hip socket:   Stretches for your legs: LAYING on Back Use upper arms and elbows for stability when pulling strap   Strap on [place]: _strap, L knee bent, R [place] against strap and spread toes, rolling foot 15 deg out and in across midline.  10 reps each side  _knee bends 10 reps     Stretches for your legs: LAYING on Back Use upper arms and elbows for stability when pulling strap   Strap on [place]: _strap, L knee bent, R ballmound against strap and spread toes, rolling foot 15 deg out and in across midline.  10 reps each side  _knee bends 10 reps     Laying on your back with a towel under opposite thigh of the thigh you want to stretch Cross __  ankle over __  Thigh  Inhale, feel pelvic floor lengthen/diaphragm expand left and right laterally Exhale, bring thighs closer to chest by pulling towel with hands. Keep shoulders and neck down on the pillow and  relaxed.   3 breaths

## 2016-06-19 NOTE — Therapy (Signed)
[place] MAIN [facility] SERVICES [address] [place], [place], [address] Phone: [phone]   Fax:  [phone]  Physical Therapy Treatment  Patient Details  Name: Meagan Cox MRN: [id number] Date of Birth: [birth year] No Data Recorded  Encounter Date: 06/19/2016      PT End of Session - 06/19/16 1433    Visit Number 42   Date for PT Re-Evaluation 06/21/16   Authorization Type 2  g-code    PT Start Time 1335   PT Stop Time 1433   PT Time Calculation (min) 58 min   Activity Tolerance No increased pain;Patient tolerated treatment well   Behavior During Therapy [facility] for tasks assessed/performed      Past Medical History:  Diagnosis Date  . Hyperlipidemia   . Hypertension   . Hypothyroidism     Past Surgical History:  Procedure Laterality Date  . ABDOMINAL HYSTERECTOMY    . CARDIAC CATHETERIZATION     30 years ago, [place]  . CHOLECYSTECTOMY N/A 08/21/2015   Procedure: LAPAROSCOPIC CHOLECYSTECTOMY;  Surgeon: Jules Husbands, MD;  Location: [facility] ORS;  Service: General;  Laterality: N/A;  . PARTIAL HYSTERECTOMY    . TONSILLECTOMY      There were no vitals filed for this visit.      Subjective Assessment - 06/19/16 1338    Subjective Pt reported she has had 4 incidents when her R leg "gave out" on her since her last session. She was able to catch herself to prevent from falling. Pt no longer has had the posterior buttock pain since last session. Pt only complains of the pain at the inguinal area on R. Pt has had an xray and her PCP told her severe degeneration and bone spur and recommended her to have a cortisone shot Pt is interested in the shot only has a last resort.    Pertinent History R medial thigh pain: Pt has trouble sleeping  on L side due to L arm pain  and tries to sleep on her back and belly.  Pt has difficulty sleeping on R  side due to R hip bursitis. Hx of R knee issues (meniscus "slipping out out place" and  chriporactor helps to realign). Resolved for the past 1-2 years.   Gynecological: 3 vaginal birth,  without perineal trauma.  SUI . Denied saddle signs.  Pt travels to [place] 2x a year. Pt will be leaving in August for her next trip.     Patient Stated Goals standing long periods at the airport and traveling with less pain. "Quit hurting."            [facility] PT Assessment - 06/19/16 1401      AROM   Overall AROM Comments supine, able to hip flex/ER/ abd on RLE      Strength   Overall Strength Comments hip ext: R 3+/5, L 4/5 , hip flex/knee ext/flex on R 3+/5, L 4/5   (POST- Tx: R LE 4-/5)     Palpation   Palpation comment tenderness/tensions along posterior iliac crest, S2 foramen level on R edge of sacrum.  no tensions noted at ischial tuberosity/ medial groin, tenderness w/ less tensions at semitendinosus/ membranosus at R   palpation along iliac crest (reporduced medial groin pain)      Bed Mobility   Bed Mobility --  one incident where she grimaced with pain w/ supine<>prone  [place] Adult PT Treatment/Exercise - 06/19/16 1401      Therapeutic Activites    Therapeutic Activities --  see pt instructions     Manual Therapy   Internal Pelvic Floor prone: STM grade III mob PA at at rim of iliac crest R and with heel prone press                 PT Education - 06/19/16 1432    Education provided Yes   Education Details HEP, reviewed the xrays with pt to see the narrowed joint space in the acetabulum and femur head , explained which HEP helps with lubricating and loosening the hip joint     Person(s) Educated Patient   Methods Explanation;Demonstration;Tactile cues;Verbal cues;Handout   Comprehension Returned demonstration;Verbalized understanding             PT Long Term Goals - 06/08/16 1959      PT LONG TERM GOAL #1   Title Pt will decrease her ODI score from 24% to < 20% in order to travel on her trips to [place] and walk long  distances on uneven surfaces.  (09/26/15: 12%)    Time 12   Period Weeks   Status Achieved     PT LONG TERM GOAL #2   Title Pt will decrease her PSQI score from 29% to < 24% in order to improve sleep quality and demonstrate ability to lay in a comfortable position.  (1%)   Time 12   Period Weeks   Status Achieved     PT LONG TERM GOAL #3   Title Pt will be complaint with water intake in order to promote bladder health and hydration for muscles for walking.    Time 12   Status Achieved     PT LONG TERM GOAL #4   Title Pt will demo no pelvic asymmetries and increased hip PROM IR bilaterally > 10 deg in order to improve gait mechanics for ambulation.    Time 12   Period Weeks   Status Achieved     PT LONG TERM GOAL #5   Title Pt will demo no pain with sidestepping L with yellow resistive band at thigh across 5 steps in order to perform exercises and to return to walking longer distances and maintain proper alignment of hips and LE.   Time 12   Period Weeks   Status Achieved     Additional Long Term Goals   Additional Long Term Goals Yes     PT LONG TERM GOAL #6   Title Pt will demo IND with HEP   Time 12   Period Weeks   Status Partially Met     PT LONG TERM GOAL #7   Title Pt will demo decreased thoracic/ lumbar/ gluteal mm tensions B and report no reoccurence of pain at R glut and medial to ASIS/inguinal area across 4 weeks in order to demo IND with self-maintenence to m inimize relapse of pain.    Time 12   Period Weeks   Status Achieved     PT LONG TERM GOAL #8   Title Pt will be able to perform hip abd/ER with R heel over L thigh in supine in order to demo less pain and to perform higher functional fitness exercises to minimize relapse of pain   Time 12   Period Weeks   Status Achieved     PT LONG TERM GOAL  #9   TITLE Pt will report to be able lay on  her R side to sleep in order to improve QOL   Time 12   Period Weeks   Status On-going     PT LONG TERM GOAL   #10   TITLE Pt will demo less gait deviations ( heel striking, short stride, decreased hip flexion) across 2 visits without cuing in order to walk with improved mechanics and minimize risks for relapse of pain and to go on foriegn travels.   Time 12   Period Weeks   Status Achieved     PT LONG TERM GOAL  #11   TITLE Pt will be able to adduct and flex hip in supine, standing hip flex/ext/ abduction/adduction, seated hip flex/abd/ER with co-activation with pelvic floor without pain and with functional ROM in order to seat, stand, and walk on out of town trips     Time 12   Period Weeks   Status Partially Met     PT LONG TERM GOAL  #12   TITLE Pt will demo no tensions/ tenderness along R semitendinosus/ membranosus at distal attachments in order to maintain proper knee alignment with stairs    Time 12   Period Weeks   Status New               Plan - 06/19/16 1433    Clinical Impression Statement Pt was shown her xrays and explained how manual Tx and HEP have been customized for the 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  Pt showed significantly improved hip mobility and resolved posterior leg pain since last session. Today she showed less pain during changing bed positions and had increased hip strength following Tx. Anterior groin pain and the "catching " sensation that occurred during walking was reproduced with palpation over posterior iliac crest. Her LE strength improved and this area  decreased in tenderness and tensions following Tx today.  Plan to progress pt to closed kinetic chain strengthening in the upcoming visits.Pt is due for MRI on her R knee end of this month. Pt continues to benefit from skilled PT.     Rehab Potential Good   PT Frequency 2x / week   PT Duration 12 weeks   PT Treatment/Interventions ADLs/Self Care Home Management;Aquatic Therapy;Gait training;Traction;Moist Heat;Stair training;Functional mobility  training;Therapeutic activities;Therapeutic exercise;Balance training;Neuromuscular re-education;Electrical Stimulation;Cryotherapy;Manual techniques;Patient/family education;Passive range of motion;Dry needling;Energy conservation   Consulted and Agree with Plan of Care Patient      Patient will benefit from skilled therapeutic intervention in order to improve the following deficits and impairments:  Abnormal gait, Pain, Improper body mechanics, Postural dysfunction, Other (comment), Decreased mobility, Decreased coordination, Decreased activity tolerance, Decreased endurance, Decreased balance, Decreased safety awareness, Impaired flexibility, Decreased range of motion, Obesity, Decreased strength, Cardiopulmonary status limiting activity, Difficulty walking, Decreased scar mobility  Visit Diagnosis: Myalgia  Other abnormalities of gait and mobility  Poor posture     Problem List Patient Active Problem List   Diagnosis Date Noted  . Cholecystitis 08/21/2015  . Medicare annual wellness visit, subsequent 07/24/2015  . Cerebrovascular small vessel disease 04/24/2015  . Benign paroxysmal positional vertigo 04/24/2015  . Pain in joint, pelvic region and thigh 04/24/2015  . Counseling for travel 01/11/2015  . Shoulder pain, right 11/22/2013  . Statin intolerance 10/08/2012  . Edema 05/14/2012  . Generalized anxiety disorder 01/06/2012  . 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 Hypothyroidism 12/18/2010  . Low back pain radiating to both legs 12/18/2010  . Hip pain, right 12/18/2010  .  Obesity 05/09/2010  . CAD (coronary artery disease) 05/09/2010  . Hyperlipidemia 05/09/2010    Jerl Mina ,PT, DPT, E-RYT  06/19/2016, 2:35 PM  [place] MAIN [facility] SERVICES [address] [place], [place], [address] Phone: [phone]   Fax:  [phone]  Name: Meagan Cox MRN: [id number] Date of Birth: October 03, 1940

## 2016-06-21 ENCOUNTER — Ambulatory Visit: Payer: PPO | Admitting: Physical Therapy

## 2016-06-21 ENCOUNTER — Encounter: Payer: Self-pay | Admitting: Internal Medicine

## 2016-06-21 DIAGNOSIS — R293 Abnormal posture: Secondary | ICD-10-CM

## 2016-06-21 DIAGNOSIS — R2689 Other abnormalities of gait and mobility: Secondary | ICD-10-CM

## 2016-06-21 DIAGNOSIS — M791 Myalgia, unspecified site: Secondary | ICD-10-CM

## 2016-06-22 NOTE — Therapy (Addendum)
[place] MAIN [facility] SERVICES [address] [place], [place], [address] Phone: [phone]   Fax:  [phone]  Physical Therapy Treatment  Patient Details  Name: Meagan Cox MRN: [id number] Date of Birth: [birth year] No Data Recorded  Encounter Date: 06/21/2016      PT End of Session - 06/22/16 1248    Visit Number 43   Date for PT Re-Evaluation 07/23/16   Authorization Type 3  g-code    PT Start Time 1500   PT Stop Time 1600   PT Time Calculation (min) 60 min   Activity Tolerance No increased pain;Patient tolerated treatment well   Behavior During Therapy [facility] for tasks assessed/performed      Past Medical History:  Diagnosis Date  . Hyperlipidemia   . Hypertension   . Hypothyroidism     Past Surgical History:  Procedure Laterality Date  . ABDOMINAL HYSTERECTOMY    . CARDIAC CATHETERIZATION     30 years ago, [place]  . CHOLECYSTECTOMY N/A 08/21/2015   Procedure: LAPAROSCOPIC CHOLECYSTECTOMY;  Surgeon: Jules Husbands, MD;  Location: [facility] ORS;  Service: General;  Laterality: N/A;  . PARTIAL HYSTERECTOMY    . TONSILLECTOMY      There were no vitals filed for this visit.      Subjective Assessment - 06/21/16 1508    Subjective Pt reported her R leg is doing better and the pain has improved "like day and night". She now can walk better compared to when she came back from her trip. She still has trouble in the R groin when getting up out of a chair or off the toilet . She has to lean more on her L leg.    Pertinent History R medial thigh pain: Pt has trouble sleeping  on L side due to L arm pain  and tries to sleep on her back and belly.  Pt has difficulty sleeping on R  side due to R hip bursitis. Hx of R knee issues (meniscus "slipping out out place" and chriporactor helps to realign). Resolved for the past 1-2 years.   Gynecological: 3 vaginal birth,  without perineal trauma.  SUI . Denied saddle signs.  Pt travels to  [place] 2x a year. Pt will be leaving in August for her next trip.     Patient Stated Goals standing long periods at the airport and traveling with less pain. "Quit hurting."            [facility] PT Assessment - 06/21/16 1519      Strength   Overall Strength Comments hip ext/ flex/knee ext/flex on R 4/5, L 4/5                       [facility] Adult PT Treatment/Exercise - 06/22/16 1242      Therapeutic Activites    Therapeutic Activities --  see pt instructions     Manual Therapy   Internal Pelvic Floor Prone: inferior/ superior mob of sacrum,, PA mobs Grade III along R lateral border of SIJ, no reproduction of groin pain after Tx                 PT Education - 06/22/16 1247    Education provided Yes   Education Details HEP   Person(s) Educated Patient   Methods Explanation;Demonstration;Tactile cues;Verbal cues;Handout   Comprehension Returned demonstration;Verbalized understanding             PT Long Term Goals - 06/08/16  1959      PT LONG TERM GOAL #1   Title Pt will decrease her ODI score from 24% to < 20% in order to travel on her trips to [place] and walk long distances on uneven surfaces.  (09/26/15: 12%)    Time 12   Period Weeks   Status Achieved     PT LONG TERM GOAL #2   Title Pt will decrease her PSQI score from 29% to < 24% in order to improve sleep quality and demonstrate ability to lay in a comfortable position.  (1%)   Time 12   Period Weeks   Status Achieved     PT LONG TERM GOAL #3   Title Pt will be complaint with water intake in order to promote bladder health and hydration for muscles for walking.    Time 12   Status Achieved     PT LONG TERM GOAL #4   Title Pt will demo no pelvic asymmetries and increased hip PROM IR bilaterally > 10 deg in order to improve gait mechanics for ambulation.    Time 12   Period Weeks   Status Achieved     PT LONG TERM GOAL #5   Title Pt will demo no pain with sidestepping L with yellow  resistive band at thigh across 5 steps in order to perform exercises and to return to walking longer distances and maintain proper alignment of hips and LE.   Time 12   Period Weeks   Status Achieved     Additional Long Term Goals   Additional Long Term Goals Yes     PT LONG TERM GOAL #6   Title Pt will demo IND with HEP   Time 12   Period Weeks   Status Partially Met     PT LONG TERM GOAL #7   Title Pt will demo decreased thoracic/ lumbar/ gluteal mm tensions B and report no reoccurence of pain at R glut and medial to ASIS/inguinal area across 4 weeks in order to demo IND with self-maintenence to m inimize relapse of pain.    Time 12   Period Weeks   Status Achieved     PT LONG TERM GOAL #8   Title Pt will be able to perform hip abd/ER with R heel over L thigh in supine in order to demo less pain and to perform higher functional fitness exercises to minimize relapse of pain   Time 12   Period Weeks   Status Achieved     PT LONG TERM GOAL  #9   TITLE Pt will report to be able lay on her R side to sleep in order to improve QOL   Time 12   Period Weeks   Status On-going     PT LONG TERM GOAL  #10   TITLE Pt will demo less gait deviations ( heel striking, short stride, decreased hip flexion) across 2 visits without cuing in order to walk with improved mechanics and minimize risks for relapse of pain and to go on foriegn travels.   Time 12   Period Weeks   Status Achieved     PT LONG TERM GOAL  #11   TITLE Pt will be able to adduct and flex hip in supine, standing hip flex/ext/ abduction/adduction, seated hip flex/abd/ER with co-activation with pelvic floor without pain and with functional ROM in order to seat, stand, and walk on out of town trips     Time 12   Period Weeks  Status Partially Met     PT LONG TERM GOAL  #12   TITLE Pt will demo no tensions/ tenderness along R semitendinosus/ membranosus at distal attachments in order to maintain proper knee alignment with  stairs    Time 12   Period Weeks   Status New               Plan - 06/22/16 1248    Clinical Impression Statement Pt has regained hip strength and ROM following last session and pt reported she is feeling much better with walking and getting out of the chair. However, she still has to off load on her RLE due to the pulling/catching/ pain sensation she feels on her anterior thigh/ R groin.  This pain was reproduced with PA mobilizations along the R lateral border of her sacroiliac joint in prone position and was decreased following Tx. Pt reported she was able to  walk and get out of chair at the end of the session with more ease and less pain compared to the beginning of her session. Progressed pt to psoterior upper/ lower body strengthening as she presents with glut mm  and opposite thoracolumbar mm weakness. Pt continues to benefit from skilled PT. Pt will be getting the MRI on her knee next week. Plan to customize her Tx accordingly to the findings.     Rehab Potential Good   PT Frequency 2x / week   PT Duration 12 weeks   PT Treatment/Interventions ADLs/Self Care Home Management;Aquatic Therapy;Gait training;Traction;Moist Heat;Stair training;Functional mobility training;Therapeutic activities;Therapeutic exercise;Balance training;Neuromuscular re-education;Electrical Stimulation;Cryotherapy;Manual techniques;Patient/family education;Passive range of motion;Dry needling;Energy conservation   Consulted and Agree with Plan of Care Patient      Patient will benefit from skilled therapeutic intervention in order to improve the following deficits and impairments:  Abnormal gait, Pain, Improper body mechanics, Postural dysfunction, Other (comment), Decreased mobility, Decreased coordination, Decreased activity tolerance, Decreased endurance, Decreased balance, Decreased safety awareness, Impaired flexibility, Decreased range of motion, Obesity, Decreased strength, Cardiopulmonary status  limiting activity, Difficulty walking, Decreased scar mobility  Visit Diagnosis: No diagnosis found.     Problem List Patient Active Problem List   Diagnosis Date Noted  . Cholecystitis 08/21/2015  . Medicare annual wellness visit, subsequent 07/24/2015  . Cerebrovascular small vessel disease 04/24/2015  . Benign paroxysmal positional vertigo 04/24/2015  . Pain in joint, pelvic region and thigh 04/24/2015  . Counseling for travel 01/11/2015  . Shoulder pain, right 11/22/2013  . Statin intolerance 10/08/2012  . Edema 05/14/2012  . Generalized anxiety disorder 01/06/2012  . 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 Hypothyroidism 12/18/2010  . Low back pain radiating to both legs 12/18/2010  . Hip pain, right 12/18/2010  . Obesity 05/09/2010  . CAD (coronary artery disease) 05/09/2010  . Hyperlipidemia 05/09/2010    Jerl Mina  ,PT, DPT, E-RYT  06/22/2016, 12:55 PM  [place] MAIN [facility] SERVICES [address] [place], [place], [address] Phone: [phone]   Fax:  [phone]  Name: Meagan Cox MRN: [id number] Date of Birth: May 21, 1940

## 2016-06-22 NOTE — Patient Instructions (Addendum)
Discontinue frog stretch   Grid chart provided to stay focused on  4 exercises: 10 reps x 2 1.Heel press    2. Side stepping 3. Super woman in prone or standing ( leg lift without arching the back with opposite hand reaching back , shoudlers down away from ears  4.

## 2016-06-28 ENCOUNTER — Encounter: Payer: Self-pay | Admitting: Internal Medicine

## 2016-06-28 ENCOUNTER — Ambulatory Visit
Admission: RE | Admit: 2016-06-28 | Discharge: 2016-06-28 | Disposition: A | Discharge status: Home / Self Care | Payer: PPO | Source: Ambulatory Visit | Attending: Internal Medicine | Admitting: Internal Medicine

## 2016-06-28 DIAGNOSIS — S83511A Sprain of anterior cruciate ligament of right knee, initial encounter: Secondary | ICD-10-CM | POA: Insufficient documentation

## 2016-06-28 DIAGNOSIS — M25561 Pain in right knee: Secondary | ICD-10-CM | POA: Diagnosis not present

## 2016-06-28 DIAGNOSIS — X58XXXA Exposure to other specified factors, initial encounter: Secondary | ICD-10-CM | POA: Diagnosis not present

## 2016-06-28 DIAGNOSIS — S83281A Other tear of lateral meniscus, current injury, right knee, initial encounter: Secondary | ICD-10-CM | POA: Diagnosis not present

## 2016-06-29 ENCOUNTER — Other Ambulatory Visit: Payer: Self-pay | Admitting: Internal Medicine

## 2016-06-29 DIAGNOSIS — G8929 Other chronic pain: Secondary | ICD-10-CM

## 2016-06-29 DIAGNOSIS — M25561 Pain in right knee: Principal | ICD-10-CM

## 2016-07-02 ENCOUNTER — Ambulatory Visit: Payer: PPO | Attending: Internal Medicine | Admitting: Physical Therapy

## 2016-07-02 DIAGNOSIS — M791 Myalgia, unspecified site: Secondary | ICD-10-CM

## 2016-07-02 DIAGNOSIS — M25551 Pain in right hip: Secondary | ICD-10-CM | POA: Insufficient documentation

## 2016-07-02 DIAGNOSIS — R293 Abnormal posture: Secondary | ICD-10-CM | POA: Diagnosis not present

## 2016-07-02 DIAGNOSIS — M6281 Muscle weakness (generalized): Secondary | ICD-10-CM | POA: Insufficient documentation

## 2016-07-02 DIAGNOSIS — R2689 Other abnormalities of gait and mobility: Secondary | ICD-10-CM | POA: Diagnosis not present

## 2016-07-02 NOTE — Therapy (Addendum)
[place] MAIN [facility] SERVICES [address] [place], [place], [address] Phone: [phone]   Fax:  [phone]  Physical Therapy Treatment  Patient Details  Name: Meagan Cox MRN: [id number] Date of Birth: [birth year] No Data Recorded  Encounter Date: 07/02/2016      PT End of Session - 07/03/16 0837    Visit Number 44   Date for PT Re-Evaluation 07/23/16   Authorization Type 4  g-code    PT Start Time 1300   PT Stop Time 1410   PT Time Calculation (min) 70 min   Activity Tolerance No increased pain;Patient tolerated treatment well   Behavior During Therapy [facility] for tasks assessed/performed      Past Medical History:  Diagnosis Date  . Hyperlipidemia   . Hypertension   . Hypothyroidism     Past Surgical History:  Procedure Laterality Date  . ABDOMINAL HYSTERECTOMY    . CARDIAC CATHETERIZATION     30 years ago, [place]  . CHOLECYSTECTOMY N/A 08/21/2015   Procedure: LAPAROSCOPIC CHOLECYSTECTOMY;  Surgeon: Jules Husbands, MD;  Location: [facility] ORS;  Service: General;  Laterality: N/A;  . PARTIAL HYSTERECTOMY    . TONSILLECTOMY      There were no vitals filed for this visit.      Subjective Assessment - 07/02/16 1312    Subjective Pt's MRI report was positive for ACL and meniscus tear. PCP referred her to [facility]. Today, pt feels burning in her posterior gluts area.    Pertinent History R medial thigh pain: Pt has trouble sleeping  on L side due to L arm pain  and tries to sleep on her back and belly.  Pt has difficulty sleeping on R  side due to R hip bursitis. Hx of R knee issues (meniscus "slipping out out place" and chriporactor helps to realign). Resolved for the past 1-2 years.   Gynecological: 3 vaginal birth,  without perineal trauma.  SUI . Denied saddle signs.  Pt travels to [place] 2x a year. Pt will be leaving in August for her next trip.     Patient Stated Goals standing long periods at the airport  and traveling with less pain. "Quit hurting."            [facility] PT Assessment - 07/03/16 0835      Ambulation/Gait   Gait Comments less cuing                     [facility] Adult PT Treatment/Exercise - 07/03/16 0835      Therapeutic Activites    Therapeutic Activities --  phoned MDs to help pt get scheduled with ortho MD     Neuro Re-ed    Neuro Re-ed Details  prone hip extension for HEP     Manual Therapy   Internal Pelvic Floor Prone: inferior/ superior mob of sacrum,,PA mobs Grade III along B lateral border of SIJ with MWM glut ext, reproduction of groin with palpation over sacrum promontory                PT Education - 07/03/16 0836    Education provided Yes   Education Details HEP, facilitated earlier appt with orthopedic MD, showed anatomy pictures on knee ligaments/meniscus    Person(s) Educated Patient   Methods Explanation;Demonstration;Tactile cues;Verbal cues   Comprehension Returned demonstration;Verbalized understanding             PT Long Term Goals - 07/02/16 1332  PT LONG TERM GOAL #1   Title Pt will decrease her ODI score from 24% to < 20% in order to travel on her trips to [place] and walk long distances on uneven surfaces.  (09/26/15: 12%)    Time 12   Period Weeks   Status Achieved     PT LONG TERM GOAL #2   Title Pt will decrease her PSQI score from 29% to < 24% in order to improve sleep quality and demonstrate ability to lay in a comfortable position.  (1%)   Time 12   Period Weeks   Status Achieved     PT LONG TERM GOAL #3   Title Pt will be complaint with water intake in order to promote bladder health and hydration for muscles for walking.    Time 12   Status Achieved     PT LONG TERM GOAL #4   Title Pt will demo no pelvic asymmetries and increased hip PROM IR bilaterally > 10 deg in order to improve gait mechanics for ambulation.    Time 12   Period Weeks   Status Achieved     PT LONG TERM GOAL #5   Title  Pt will demo no pain with sidestepping L with yellow resistive band at thigh across 5 steps in order to perform exercises and to return to walking longer distances and maintain proper alignment of hips and LE.   Time 12   Period Weeks   Status Achieved     PT LONG TERM GOAL #6   Title Pt will demo IND with HEP   Time 12   Period Weeks   Status Partially Met     PT LONG TERM GOAL #7   Title Pt will demo decreased thoracic/ lumbar/ gluteal mm tensions B and report no reoccurence of pain at R glut and medial to ASIS/inguinal area across 4 weeks in order to demo IND with self-maintenence to m inimize relapse of pain.    Time 12   Period Weeks   Status Achieved     PT LONG TERM GOAL #8   Title Pt will be able to perform hip abd/ER with R heel over L thigh in supine in order to demo less pain and to perform higher functional fitness exercises to minimize relapse of pain   Time 12   Period Weeks   Status Achieved     PT LONG TERM GOAL  #9   TITLE Pt will report to be able lay on her R side to sleep in order to improve QOL   Time 12   Period Weeks   Status On-going     PT LONG TERM GOAL  #10   TITLE Pt will demo less gait deviations ( heel striking, short stride, decreased hip flexion) across 2 visits without cuing in order to walk with improved mechanics and minimize risks for relapse of pain and to go on foriegn travels.   Time 12   Period Weeks   Status Achieved     PT LONG TERM GOAL  #11   TITLE Pt will be able to adduct and flex hip in supine, standing hip flex/ext/ abduction/adduction, seated hip flex/abd/ER with co-activation with pelvic floor without pain and with functional ROM in order to seat, stand, and walk on out of town trips     Time 12   Period Weeks   Status Partially Met     PT LONG TERM GOAL  #12   TITLE Pt will demo no  tensions/ tenderness along R semitendinosus/ membranosus at distal attachments in order to maintain proper knee alignment with stairs    Time 12    Period Weeks   Status New               Plan - 07/02/16 1333    Clinical Impression Statement Pt had presented to PT one year ago for R groin/medial thigh pain with limited ability to get up from a chair, bending, squatting, or raise her leg. Pt had these Sx for 2 years in combination with sciatic pain. Pt completed PT for the past year and had 50% improvement in the R thigh area plus resolution of her sciatic pain. Her R hip ROM and strength has improved ( sit to stand and gait pattern with significantly less genu valgus, increased ROM, and stride length ).  Muscle/ fascial tensions in the genital-femoral triangle and posterior pelvic girdle as well as the arthrokinematics of sacroiliac joints have also improved. Pt had a period of a few months last year when her R leg did no "give out on her". Recently, after an out of town trip 2 months ago, pt experienced a worsening of her Sx due to increased sitting for long periods on the flight, walking, and stairclimbing. Pt reports she feels her muscles have gotten stronger but the pain has gotten worst.   Pt has had R knee pain for 15 years due a fall and no imaging was performed then.  Pt underwent PT at the time after the fall for her knee without positive results. Recently in the past year, pt has had severe pain in the anterior/medial aspect of her R knee, particularly after increased activities with walking, stairclimbing. Pt consented to imaging recently and her MRI showed ACL and meniscal tear. Pt's Xray of her R hip showed degeneration. See reports in her chart.    Today, the burning in her posterior gluts dissipated with PA mobs along SIJ to elicit more nutation/counternutation of the sacrum. Pt showed significantly improved hip extension strength in prone testing position compared to previous visits.  While pt's SIJ and hip issues are improving in ROM and strengtht, suspect pt 's ability to bear weight in loaded activities will improve with  Tx for her ACL/mensicus tear. Plan to communicate with her orthopedic surgeon.          Rehab Potential Good   PT Frequency 2x / week   PT Duration 12 weeks   PT Treatment/Interventions ADLs/Self Care Home Management;Aquatic Therapy;Gait training;Traction;Moist Heat;Stair training;Functional mobility training;Therapeutic activities;Therapeutic exercise;Balance training;Neuromuscular re-education;Electrical Stimulation;Cryotherapy;Manual techniques;Patient/family education;Passive range of motion;Dry needling;Energy conservation   Consulted and Agree with Plan of Care Patient      Patient will benefit from skilled therapeutic intervention in order to improve the following deficits and impairments:  Abnormal gait, Pain, Improper body mechanics, Postural dysfunction, Other (comment), Decreased mobility, Decreased coordination, Decreased activity tolerance, Decreased endurance, Decreased balance, Decreased safety awareness, Impaired flexibility, Decreased range of motion, Obesity, Decreased strength, Cardiopulmonary status limiting activity, Difficulty walking, Decreased scar mobility  Visit Diagnosis: Myalgia  Other abnormalities of gait and mobility  Poor posture     Problem List Patient Active Problem List   Diagnosis Date Noted  . Cholecystitis 08/21/2015  . Medicare annual wellness visit, subsequent 07/24/2015  . Cerebrovascular small vessel disease 04/24/2015  . Benign paroxysmal positional vertigo 04/24/2015  . Pain in joint, pelvic region and thigh 04/24/2015  . Counseling for travel 01/11/2015  . Shoulder pain, right 11/22/2013  .  Statin intolerance 10/08/2012  . Edema 05/14/2012  . Generalized anxiety disorder 01/06/2012  . 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 Hypothyroidism 12/18/2010  . Low back pain radiating to both legs 12/18/2010  . Hip pain, right 12/18/2010  . Obesity 05/09/2010  . CAD (coronary artery disease) 05/09/2010  . Hyperlipidemia 05/09/2010     Jerl Mina 07/03/2016, 8:39 AM  [place] MAIN [facility] SERVICES [address] [place], [place], [address] Phone: [phone]   Fax:  [phone]  Name: Meagan Cox MRN: [id number] Date of Birth: Apr 28, 1940

## 2016-07-03 DIAGNOSIS — M1611 Unilateral primary osteoarthritis, right hip: Secondary | ICD-10-CM | POA: Diagnosis not present

## 2016-07-03 NOTE — Patient Instructions (Signed)
Laying on belly,  Leg lifts , leg straight 5 reps each side

## 2016-07-05 ENCOUNTER — Ambulatory Visit: Payer: PPO | Admitting: Physical Therapy

## 2016-07-05 DIAGNOSIS — R293 Abnormal posture: Secondary | ICD-10-CM

## 2016-07-05 DIAGNOSIS — M791 Myalgia, unspecified site: Secondary | ICD-10-CM

## 2016-07-05 DIAGNOSIS — R2689 Other abnormalities of gait and mobility: Secondary | ICD-10-CM

## 2016-07-05 NOTE — Therapy (Signed)
[place] MAIN [facility] SERVICES [address] [place], [place], [address] Phone: [phone]   Fax:  [phone]  Physical Therapy Treatment  Patient Details  Name: Meagan Cox MRN: [id number] Date of Birth: 04-28-1940 No Data Recorded  Encounter Date: 07/05/2016      PT End of Session - 07/05/16 1801    Visit Number 45   Date for PT Re-Evaluation 07/23/16   Authorization Type 5   PT Start Time 1500   PT Stop Time 1600   PT Time Calculation (min) 60 min   Activity Tolerance No increased pain;Patient tolerated treatment well   Behavior During Therapy [facility] for tasks assessed/performed      Past Medical History:  Diagnosis Date  . Hyperlipidemia   . Hypertension   . Hypothyroidism     Past Surgical History:  Procedure Laterality Date  . ABDOMINAL HYSTERECTOMY    . CARDIAC CATHETERIZATION     30 years ago, [place]  . CHOLECYSTECTOMY N/A 08/21/2015   Procedure: LAPAROSCOPIC CHOLECYSTECTOMY;  Surgeon: Jules Husbands, MD;  Location: [facility] ORS;  Service: General;  Laterality: N/A;  . PARTIAL HYSTERECTOMY    . TONSILLECTOMY      There were no vitals filed for this visit.      Subjective Assessment - 07/05/16 1515    Subjective Pt consulted an orthopedic doctor yesterday and she was told hip replacement surgery is recommended and can be discussed and scheduled following her white water rafting trip in August and [place] trip in Sept. Pt currently has difficulty getting up from her toilet at home and has to rely on pushing off her sink because she does not have grab bars. Pt does not feel knee pain today but hurting at the R posterior glut and inner groin.      Pertinent History R medial thigh pain: Pt has trouble sleeping  on L side due to L arm pain  and tries to sleep on her back and belly.  Pt has difficulty sleeping on R  side due to R hip bursitis. Hx of R knee issues (meniscus "slipping out out place" and chriporactor helps to  realign). Resolved for the past 1-2 years.   Gynecological: 3 vaginal birth,  without perineal trauma.  SUI . Denied saddle signs.  Pt travels to [place] 2x a year. Pt will be leaving in August for her next trip.     Patient Stated Goals standing long periods at the airport and traveling with less pain. "Quit hurting."            [facility] PT Assessment - 07/05/16 1747      Observation/Other Assessments   Observations wincing with bed mobility. AAROM with knee flexion with belt helped pt continue with exercises.      AROM   AROM Assessment Site --  hip abd,flex (~12odeg) with strap on R      Strength   Overall Strength Comments dificulty to perform prone hip ext due to pain      Palpation   SI assessment  increased m tensions distal portion of glut max on R, pt able to minimize pain with self-correction HEP   tightness at piriformis R, SIJ                       [facility] Adult PT Treatment/Exercise - 07/05/16 1751      Therapeutic Activites    Therapeutic Activities --  phoned MDs to help pt get  scheduled with ortho MD                     PT Long Term Goals - 07/02/16 1332      PT LONG TERM GOAL #1   Title Pt will decrease her ODI score from 24% to < 20% in order to travel on her trips to [place] and walk long distances on uneven surfaces.  (09/26/15: 12%)    Time 12   Period Weeks   Status Achieved     PT LONG TERM GOAL #2   Title Pt will decrease her PSQI score from 29% to < 24% in order to improve sleep quality and demonstrate ability to lay in a comfortable position.  (1%)   Time 12   Period Weeks   Status Achieved     PT LONG TERM GOAL #3   Title Pt will be complaint with water intake in order to promote bladder health and hydration for muscles for walking.    Time 12   Status Achieved     PT LONG TERM GOAL #4   Title Pt will demo no pelvic asymmetries and increased hip PROM IR bilaterally > 10 deg in order to improve gait mechanics for  ambulation.    Time 12   Period Weeks   Status Achieved     PT LONG TERM GOAL #5   Title Pt will demo no pain with sidestepping L with yellow resistive band at thigh across 5 steps in order to perform exercises and to return to walking longer distances and maintain proper alignment of hips and LE.   Time 12   Period Weeks   Status Achieved     PT LONG TERM GOAL #6   Title Pt will demo IND with HEP   Time 12   Period Weeks   Status Partially Met     PT LONG TERM GOAL #7   Title Pt will demo decreased thoracic/ lumbar/ gluteal mm tensions B and report no reoccurence of pain at R glut and medial to ASIS/inguinal area across 4 weeks in order to demo IND with self-maintenence to m inimize relapse of pain.    Time 12   Period Weeks   Status Achieved     PT LONG TERM GOAL #8   Title Pt will be able to perform hip abd/ER with R heel over L thigh in supine in order to demo less pain and to perform higher functional fitness exercises to minimize relapse of pain   Time 12   Period Weeks   Status Achieved     PT LONG TERM GOAL  #9   TITLE Pt will report to be able lay on her R side to sleep in order to improve QOL   Time 12   Period Weeks   Status On-going     PT LONG TERM GOAL  #10   TITLE Pt will demo less gait deviations ( heel striking, short stride, decreased hip flexion) across 2 visits without cuing in order to walk with improved mechanics and minimize risks for relapse of pain and to go on foriegn travels.   Time 12   Period Weeks   Status Achieved     PT LONG TERM GOAL  #11   TITLE Pt will be able to adduct and flex hip in supine, standing hip flex/ext/ abduction/adduction, seated hip flex/abd/ER with co-activation with pelvic floor without pain and with functional ROM in order to seat, stand, and walk on  out of town trips     Time 12   Period Weeks   Status Partially Met     PT LONG TERM GOAL  #12   TITLE Pt will demo no tensions/ tenderness along R semitendinosus/  membranosus at distal attachments in order to maintain proper knee alignment with stairs    Time 12   Period Weeks   Status New               Plan - 07/05/16 1600    Clinical Impression Statement Pt consulted an orthopedic specialist who recommnded that she undergo hip replacement surgery and allowed her to discuss details following her two trips as her requested. Pt showed pain in the posterior R gluts/ R groin  today and voiced she must have overdone her activities today. Pt showed slower gait but not antalgic .Pt continue to have hip flex/abd/add AAROM but showed decreased hip ext in prone compared to last visit.  Pt reports she is having difficulty getting up from her toilet. Discussed safety and prevention of falls w/ a bedside commode to help her get up from the toilet in her home which has not grab bars.  Also advised use of SPC if need to minimize load on her R hip but pt refused. Today, pt was able to minimize her posterior glut pain with self-correction HEP which PT directed to help pt become more IND and less dependent on manual Tx. Replaced monster walk exercise with hooklying clams. Modied hip ext with resistance with bridges. Discussed pool Tx for July 2x/ week to increase her strength while offloading her joints. Pt continues to benefit from skilled PT.     Rehab Potential Good   PT Frequency 2x / week   PT Duration 12 weeks   PT Treatment/Interventions ADLs/Self Care Home Management;Aquatic Therapy;Gait training;Traction;Moist Heat;Stair training;Functional mobility training;Therapeutic activities;Therapeutic exercise;Balance training;Neuromuscular re-education;Electrical Stimulation;Cryotherapy;Manual techniques;Patient/family education;Passive range of motion;Dry needling;Energy conservation   Consulted and Agree with Plan of Care Patient      Patient will benefit from skilled therapeutic intervention in order to improve the following deficits and impairments:  Abnormal  gait, Pain, Improper body mechanics, Postural dysfunction, Other (comment), Decreased mobility, Decreased coordination, Decreased activity tolerance, Decreased endurance, Decreased balance, Decreased safety awareness, Impaired flexibility, Decreased range of motion, Obesity, Decreased strength, Cardiopulmonary status limiting activity, Difficulty walking, Decreased scar mobility  Visit Diagnosis: Myalgia  Other abnormalities of gait and mobility  Poor posture     Problem List Patient Active Problem List   Diagnosis Date Noted  . Cholecystitis 08/21/2015  . Medicare annual wellness visit, subsequent 07/24/2015  . Cerebrovascular small vessel disease 04/24/2015  . Benign paroxysmal positional vertigo 04/24/2015  . Pain in joint, pelvic region and thigh 04/24/2015  . Counseling for travel 01/11/2015  . Shoulder pain, right 11/22/2013  . Statin intolerance 10/08/2012  . Edema 05/14/2012  . Generalized anxiety disorder 01/06/2012  . 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 Hypothyroidism 12/18/2010  . Low back pain radiating to both legs 12/18/2010  . Hip pain, right 12/18/2010  . Obesity 05/09/2010  . CAD (coronary artery disease) 05/09/2010  . Hyperlipidemia 05/09/2010    Jerl Mina ,PT, DPT, E-RYT  07/05/2016, 6:05 PM  [place] MAIN [facility] SERVICES [address] [place], [place], [address] Phone: [phone]   Fax:  [phone]  Name: Brenlynn Fake MRN: [id number] Date of Birth: [birth year]

## 2016-07-05 NOTE — Patient Instructions (Addendum)
To loosen up the back of the hips  Use a strap to move thigh up to position, knees bent, Knees to chest with feet on table Heel slide 10 x   Strap in R hand     Strap on L hand, small knee in and out  Squishy ball uner the hips     __________   Instead of monster walking  Red band at thigh, laying down with hip to the side  (different from deep core level out)  10 x 3 on B hip

## 2016-07-12 ENCOUNTER — Encounter: Payer: Self-pay | Admitting: Internal Medicine

## 2016-07-12 DIAGNOSIS — M1611 Unilateral primary osteoarthritis, right hip: Secondary | ICD-10-CM | POA: Diagnosis not present

## 2016-07-13 ENCOUNTER — Encounter: Payer: Self-pay | Admitting: Physical Therapy

## 2016-07-13 ENCOUNTER — Ambulatory Visit: Payer: PPO | Admitting: Physical Therapy

## 2016-07-13 DIAGNOSIS — R2689 Other abnormalities of gait and mobility: Secondary | ICD-10-CM

## 2016-07-13 DIAGNOSIS — M6281 Muscle weakness (generalized): Secondary | ICD-10-CM

## 2016-07-13 DIAGNOSIS — M25551 Pain in right hip: Secondary | ICD-10-CM

## 2016-07-13 DIAGNOSIS — M791 Myalgia, unspecified site: Secondary | ICD-10-CM

## 2016-07-13 DIAGNOSIS — R293 Abnormal posture: Secondary | ICD-10-CM

## 2016-07-13 NOTE — Therapy (Deleted)
[place] MAIN [facility] SERVICES [address] [place], [place], [address] Phone: [phone]   Fax:  [phone]  Physical Therapy Treatment  Patient Details  Name: Nyhla Cox MRN: [id number] Date of Birth: [birth year] No Data Recorded  Encounter Date: 07/13/2016    Past Medical History:  Diagnosis Date  . Hyperlipidemia   . Hypertension   . Hypothyroidism     Past Surgical History:  Procedure Laterality Date  . ABDOMINAL HYSTERECTOMY    . CARDIAC CATHETERIZATION     30 years ago, [place]  . CHOLECYSTECTOMY N/A 08/21/2015   Procedure: LAPAROSCOPIC CHOLECYSTECTOMY;  Surgeon: Jules Husbands, MD;  Location: [facility] ORS;  Service: General;  Laterality: N/A;  . PARTIAL HYSTERECTOMY    . TONSILLECTOMY      There were no vitals filed for this visit.                                    PT Long Term Goals - 07/02/16 1332      PT LONG TERM GOAL #1   Title Pt will decrease her ODI score from 24% to < 20% in order to travel on her trips to [place] and walk long distances on uneven surfaces.  (09/26/15: 12%)    Time 12   Period Weeks   Status Achieved     PT LONG TERM GOAL #2   Title Pt will decrease her PSQI score from 29% to < 24% in order to improve sleep quality and demonstrate ability to lay in a comfortable position.  (1%)   Time 12   Period Weeks   Status Achieved     PT LONG TERM GOAL #3   Title Pt will be complaint with water intake in order to promote bladder health and hydration for muscles for walking.    Time 12   Status Achieved     PT LONG TERM GOAL #4   Title Pt will demo no pelvic asymmetries and increased hip PROM IR bilaterally > 10 deg in order to improve gait mechanics for ambulation.    Time 12   Period Weeks   Status Achieved     PT LONG TERM GOAL #5   Title Pt will demo no pain with sidestepping L with yellow resistive band at thigh across 5 steps in order to perform  exercises and to return to walking longer distances and maintain proper alignment of hips and LE.   Time 12   Period Weeks   Status Achieved     PT LONG TERM GOAL #6   Title Pt will demo IND with HEP   Time 12   Period Weeks   Status Partially Met     PT LONG TERM GOAL #7   Title Pt will demo decreased thoracic/ lumbar/ gluteal mm tensions B and report no reoccurence of pain at R glut and medial to ASIS/inguinal area across 4 weeks in order to demo IND with self-maintenence to m inimize relapse of pain.    Time 12   Period Weeks   Status Achieved     PT LONG TERM GOAL #8   Title Pt will be able to perform hip abd/ER with R heel over L thigh in supine in order to demo less pain and to perform higher functional fitness exercises to minimize relapse of pain   Time 12   Period Weeks   Status Achieved  PT LONG TERM GOAL  #9   TITLE Pt will report to be able lay on her R side to sleep in order to improve QOL   Time 12   Period Weeks   Status On-going     PT LONG TERM GOAL  #10   TITLE Pt will demo less gait deviations ( heel striking, short stride, decreased hip flexion) across 2 visits without cuing in order to walk with improved mechanics and minimize risks for relapse of pain and to go on foriegn travels.   Time 12   Period Weeks   Status Achieved     PT LONG TERM GOAL  #11   TITLE Pt will be able to adduct and flex hip in supine, standing hip flex/ext/ abduction/adduction, seated hip flex/abd/ER with co-activation with pelvic floor without pain and with functional ROM in order to seat, stand, and walk on out of town trips     Time 12   Period Weeks   Status Partially Met     PT LONG TERM GOAL  #12   TITLE Pt will demo no tensions/ tenderness along R semitendinosus/ membranosus at distal attachments in order to maintain proper knee alignment with stairs    Time 12   Period Weeks   Status New             Patient will benefit from skilled therapeutic intervention  in order to improve the following deficits and impairments:     Visit Diagnosis: Other abnormalities of gait and mobility  Myalgia  Poor posture     Problem List Patient Active Problem List   Diagnosis Date Noted  . Cholecystitis 08/21/2015  . Medicare annual wellness visit, subsequent 07/24/2015  . Cerebrovascular small vessel disease 04/24/2015  . Benign paroxysmal positional vertigo 04/24/2015  . Pain in joint, pelvic region and thigh 04/24/2015  . Counseling for travel 01/11/2015  . Shoulder pain, right 11/22/2013  . Statin intolerance 10/08/2012  . Edema 05/14/2012  . Generalized anxiety disorder 01/06/2012  . 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 Hypothyroidism 12/18/2010  . Low back pain radiating to both legs 12/18/2010  . Hip pain, right 12/18/2010  . Obesity 05/09/2010  . CAD (coronary artery disease) 05/09/2010  . Hyperlipidemia 05/09/2010    Jerl Mina 07/13/2016, 8:12 AM  [place] MAIN [facility] SERVICES [address] [place], [place], [address] Phone: [phone]   Fax:  [phone]  Name: Meagan Cox MRN: [id number] Date of Birth: 1940-10-22

## 2016-07-13 NOTE — Therapy (Signed)
[place] MAIN [facility] SERVICES [address] [place], [place], [address] Phone: [phone]   Fax:  [phone]  Physical Therapy Evaluation  Patient Details  Name: Meagan Cox MRN: [id number] Date of Birth: 1940-09-20 Referring Provider: Kurtis Bushman, MD   Encounter Date: 07/13/2016      PT End of Session - 07/13/16 0945    Visit Number 1   Number of Visits 1   Authorization Type G-code    PT Start Time 0805   PT Stop Time 0900   PT Time Calculation (min) 55 min   Activity Tolerance Patient tolerated treatment well   Behavior During Therapy [facility] for tasks assessed/performed      Past Medical History:  Diagnosis Date  . Hyperlipidemia   . Hypertension   . Hypothyroidism     Past Surgical History:  Procedure Laterality Date  . ABDOMINAL HYSTERECTOMY    . CARDIAC CATHETERIZATION     30 years ago, [place]  . CHOLECYSTECTOMY N/A 08/21/2015   Procedure: LAPAROSCOPIC CHOLECYSTECTOMY;  Surgeon: Jules Husbands, MD;  Location: [facility] ORS;  Service: General;  Laterality: N/A;  . PARTIAL HYSTERECTOMY    . TONSILLECTOMY      There were no vitals filed for this visit.       Subjective Assessment - 07/13/16 0821    Subjective Pt will be undergoing anterior THA surgery on 07/20/16 on her R hip.Pt has agreed to surgery after recommendation from her orthosurgeon and PT because her R thigh pain has not improved despite 1 year of PT.  PT had resolved her LBP and helped with her sciatica. The tightness around her R leg and buttocks also decreased with PT.  Pt 's current limitations include pain with sit-to-stand, walking with occasional "catching" to her leg that makes her close to falling, climbing stairs, getting in and out of a car.   Pt wants to attend a white water rafting trip the first weekend in August.   Pt states she is discussion with her surgeon to also undergo R knee surgery to repair meniscus and ACL after she returns from her  out of country trip in October.     Pertinent History R medial thigh pain: Pt has trouble sleeping  on L side due to L arm pain  and tries to sleep on her back and belly.  Pt has difficulty sleeping on R  side due to R hip bursitis. Hx of R knee issues (meniscus "slipping out out place" and chriporactor helps to realign). Resolved for the past 1-2 years.   Gynecological: 3 vaginal birth,  without perineal trauma.  SUI . Denied saddle signs.  Pt travels to [place] 2x a year. Pt will be leaving in August for her next trip.     Patient Stated Goals standing long periods at the airport and traveling with less pain. "Quit hurting."   Currently in Pain? Yes   Pain Score 9    Pain Location Hip   Pain Orientation Right  groin in general, buttock when sitting    Pain Radiating Towards groin pain radiates to the medial knee   Pain Onset             [facility] PT Assessment - 07/13/16 0828      Assessment   Medical Diagnosis osteoarthritis of R hip    Referring Provider Kurtis Bushman, MD    Onset Date/Surgical Date 07/20/16   Hand Dominance Right   Prior Therapy 1 year of  PT      Precautions   Precautions None     Restrictions   Weight Bearing Restrictions No     Balance Screen   Has the patient fallen in the past 6 months No     Prior Function   Level of Independence Independent     Observation/Other Assessments   Other Surveys  --  LEFS 16/80 pts     AROM   Right/Left Hip --  supine:hip flex:110( heavy BUE use on armrest) L 55 deg   Right Hip Extension --  prone:5 deg w/ exertion, deviations: ASIS off plinth   Right Hip External Rotation  --  seated: ER:12R w/ pain ,20L     Right Hip Internal Rotation  --  seated:  25 R w/ pain/30 L   Right/Left Knee --  R knee flex:60 deg,      Strength   Right Hip Flexion 2-/5  heavy UE use on armrests   Right Hip Extension 3-/5   Right Hip External Rotation  2/5  pain   Right Hip Internal Rotation 2/5  Pain in R hip   Right Hip  ABduction 3-/5   Right Hip ADduction 2-/5  pain   Left Hip Flexion 4+/5   Left Hip Extension 3+/5  pain in R hip; tested in prone    Left Hip External Rotation 4+/5   Left Hip Internal Rotation 4+/5   Left Hip ABduction 4+/5   Left Hip ADduction 4+/5     Bed Mobility   Rolling Left --  prefers L due pain when laying on R    Left Sidelying to Sit --  prefers L due pain when laying on R      Transfers   Five time sit to stand comments  grimacing pain on rise, no genu valgus as prior PT has improved that deficit    Number of Reps Other reps (comment)  5   Comments used arm rests. 22 sec      Ambulation/Gait   Gait Pattern Within Functional Limits  improved stride,hip flexion,genu valgus compared to last yr   Gait velocity 0.9 m/s    Stairs Yes   Stair Management Technique One rail Right;Alternating pattern;Other (comment)  cued "up w/ good", down w/ bad, 45 deg turn to rail. no devi   Number of Stairs 10   Height of Stairs 12  inches            Objective measurements completed on examination: See above findings.          [place] Adult PT Treatment/Exercise - 07/13/16 0828      Therapeutic Activites    Therapeutic Activities --  stair navigation technique, surgery education                 PT Education - 07/13/16 0957    Education provided Yes   Education Details follow post surgery protocol and restrictions/ precautions, referral to ortho PT post-THA    Person(s) Educated Patient   Methods Explanation;Demonstration;Verbal cues   Comprehension Verbalized understanding          PT Short Term Goals - 07/13/16 0942      PT SHORT TERM GOAL #1   Title Pt will demo proper technique up/ down steps with singel UE, step thru pattern, body turned 45 deg to rail in order to minimize risk for falls or worsening of Sx when navigating in the community   Time 1   Period Days   Status  Achieved     PT SHORT TERM GOAL #2   Title Pt will voice understanding  to only practice previous HEP prior to surgery and discontinue post surgery in order to follow specific THA protocol.    Time 1   Period Days   Status Achieved     PT SHORT TERM GOAL #3   Title Pt will complete LEFS to provide baseline measurements to her dysfunction prior to surgery.  (Score: 16/80)    Time 1   Period Days   Status Achieved               Plan - 08/03/16 0946    Clinical Impression Statement Pt is a 76 yo female who will be undergoing R THA surgery on 07/20/16. Pt 's clinical presentations include difficulty with sit to stand, pain with hip ROM in all directions, and decreased  hip strength, and slowed gait speed. Pt had undergone PT for the past year which helped to resolve her LBP with sciatica, increased her hip strength, decreased mm tensions around her pelvic girdle, improve gait mechanics and speed. However, pt still having medial thigh pain and has difficulty in loaded activities 2/2 severe degenerative changes to her R hip revealed through her recent X-rays.  Pt will benefit greatly from surgery. Pt would like to attend a white water rafting trip in 2 months.  Pt is in discussion with her surgeon to undergo  R knee surgery for meniscus and ACL repair after she returns from an out of country trip in October. Pt would benefit from PT following her surgeries to continue increasing her ROM, strength, and functional mobility.  Pt is ready to be d/c at this time as she has met her pre-op goals.    Clinical Presentation Unstable   Clinical Decision Making Low   Rehab Potential Good   PT Frequency One time visit   PT Treatment/Interventions ADLs/Self Care Home Management;Aquatic Therapy;Gait training;Traction;Moist Heat;Stair training;Functional mobility training;Therapeutic activities;Therapeutic exercise;Balance training;Neuromuscular re-education;Electrical Stimulation;Cryotherapy;Manual techniques;Patient/family education;Passive range of motion;Dry needling;Energy  conservation   Consulted and Agree with Plan of Care Patient      Patient will benefit from skilled therapeutic intervention in order to improve the following deficits and impairments:  Abnormal gait, Pain, Improper body mechanics, Postural dysfunction, Other (comment), Decreased mobility, Decreased coordination, Decreased activity tolerance, Decreased endurance, Decreased balance, Decreased safety awareness, Impaired flexibility, Decreased range of motion, Obesity, Decreased strength, Cardiopulmonary status limiting activity, Difficulty walking, Decreased scar mobility  Visit Diagnosis: Myalgia  Pain in right hip  Other abnormalities of gait and mobility  Muscle weakness (generalized)  Poor posture      G-Codes - 08-03-16 0958    Functional Assessment Tool Used (Outpatient Only) LEFS , clinical judgement   Functional Limitation Mobility: Walking and moving around   Mobility: Walking and Moving Around Current Status ([id number]) At least 60 percent but less than 80 percent impaired, limited or restricted   Mobility: Walking and Moving Around Goal Status [phone]) At least 60 percent but less than 80 percent impaired, limited or restricted   Mobility: Walking and Moving Around Discharge Status [phone]) At least 60 percent but less than 80 percent impaired, limited or restricted       Problem List Patient Active Problem List   Diagnosis Date Noted  . Cholecystitis 08/21/2015  . Medicare annual wellness visit, subsequent 07/24/2015  . Cerebrovascular small vessel disease 04/24/2015  . Benign paroxysmal positional vertigo 04/24/2015  . Pain in joint, pelvic region and thigh 04/24/2015  .  Counseling for travel 01/11/2015  . Shoulder pain, right 11/22/2013  . Statin intolerance 10/08/2012  . Edema 05/14/2012  . Generalized anxiety disorder 01/06/2012  . 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 Hypothyroidism 12/18/2010  . Low back pain radiating to both legs 12/18/2010  . Hip pain, right  12/18/2010  . Obesity 05/09/2010  . CAD (coronary artery disease) 05/09/2010  . Hyperlipidemia 05/09/2010    Jerl Mina ,PT, DPT, E-RYT  07/13/2016, 9:59 AM  [place] MAIN [facility] SERVICES [address] [place], [place], [address] Phone: [phone]   Fax:  [phone]  Name: Meagan Cox MRN: [id number] Date of Birth: [birth year]

## 2016-07-13 NOTE — Therapy (Signed)
[place] MAIN [facility] SERVICES [address] [place], [place], [address] Phone: [phone]   Fax:  [phone]  Patient Details  Name: Meagan Cox MRN: [id number] Date of Birth: [birth year] Referring Provider:  Derrel Nip, MD  Encounter Date: 07/13/2016  DISCHARGE NOTE  Pt has decided to undergo surgery for her R hip ( scheduled for 07/20/16)  as recommended by her orthosurgeon and PT. Her X rays show degenerative changes. Pt is getting d/c at this time.  Pt has shown increased hip strength but will benefit greatly from a THA.  Pt will undergo R knee surgery after she returns from her out of country trip. The type of surgery will be discussed with her surgeon regarding the mensicual and ligamental tears.         Jerl Mina ,PT, DPT, E-RYT  07/13/2016, 8:12 AM  [place] MAIN [facility] SERVICES [address] [place], [place], [address] Phone: [phone]   Fax:  [phone]

## 2016-07-17 DIAGNOSIS — G4730 Sleep apnea, unspecified: Secondary | ICD-10-CM | POA: Diagnosis not present

## 2016-07-19 DIAGNOSIS — I10 Essential (primary) hypertension: Secondary | ICD-10-CM | POA: Diagnosis not present

## 2016-07-19 DIAGNOSIS — Z888 Allergy status to other drugs, medicaments and biological substances status: Secondary | ICD-10-CM | POA: Diagnosis not present

## 2016-07-19 DIAGNOSIS — Z87891 Personal history of nicotine dependence: Secondary | ICD-10-CM | POA: Diagnosis not present

## 2016-07-19 DIAGNOSIS — M25751 Osteophyte, right hip: Secondary | ICD-10-CM | POA: Diagnosis not present

## 2016-07-19 DIAGNOSIS — M25561 Pain in right knee: Secondary | ICD-10-CM | POA: Diagnosis not present

## 2016-07-19 DIAGNOSIS — Z79890 Hormone replacement therapy (postmenopausal): Secondary | ICD-10-CM | POA: Diagnosis not present

## 2016-07-19 DIAGNOSIS — Z79899 Other long term (current) drug therapy: Secondary | ICD-10-CM | POA: Diagnosis not present

## 2016-07-19 DIAGNOSIS — M879 Osteonecrosis, unspecified: Secondary | ICD-10-CM | POA: Diagnosis not present

## 2016-07-19 DIAGNOSIS — E785 Hyperlipidemia, unspecified: Secondary | ICD-10-CM | POA: Diagnosis not present

## 2016-07-19 DIAGNOSIS — R262 Difficulty in walking, not elsewhere classified: Secondary | ICD-10-CM | POA: Diagnosis not present

## 2016-07-19 DIAGNOSIS — M25551 Pain in right hip: Secondary | ICD-10-CM | POA: Diagnosis not present

## 2016-07-19 DIAGNOSIS — M1611 Unilateral primary osteoarthritis, right hip: Secondary | ICD-10-CM | POA: Diagnosis not present

## 2016-07-19 DIAGNOSIS — G4733 Obstructive sleep apnea (adult) (pediatric): Secondary | ICD-10-CM | POA: Diagnosis not present

## 2016-07-19 DIAGNOSIS — Z880 Allergy status to penicillin: Secondary | ICD-10-CM | POA: Diagnosis not present

## 2016-07-20 ENCOUNTER — Encounter: Payer: Self-pay | Admitting: Internal Medicine

## 2016-07-20 ENCOUNTER — Encounter: Payer: Self-pay | Admitting: Physical Therapy

## 2016-07-24 ENCOUNTER — Encounter: Payer: PPO | Admitting: Physical Therapy

## 2016-07-31 ENCOUNTER — Other Ambulatory Visit: Payer: Self-pay | Admitting: Internal Medicine

## 2016-08-27 DIAGNOSIS — S83206A Unspecified tear of unspecified meniscus, current injury, right knee, initial encounter: Secondary | ICD-10-CM | POA: Diagnosis not present

## 2016-08-27 DIAGNOSIS — Z96641 Presence of right artificial hip joint: Secondary | ICD-10-CM | POA: Diagnosis not present

## 2016-08-27 DIAGNOSIS — S83511A Sprain of anterior cruciate ligament of right knee, initial encounter: Secondary | ICD-10-CM | POA: Diagnosis not present

## 2016-08-28 DIAGNOSIS — S83511A Sprain of anterior cruciate ligament of right knee, initial encounter: Secondary | ICD-10-CM | POA: Diagnosis not present

## 2016-08-28 DIAGNOSIS — S83206A Unspecified tear of unspecified meniscus, current injury, right knee, initial encounter: Secondary | ICD-10-CM | POA: Diagnosis not present

## 2016-08-28 DIAGNOSIS — S83209A Unspecified tear of unspecified meniscus, current injury, unspecified knee, initial encounter: Secondary | ICD-10-CM | POA: Insufficient documentation

## 2016-08-28 DIAGNOSIS — S83519A Sprain of anterior cruciate ligament of unspecified knee, initial encounter: Secondary | ICD-10-CM | POA: Insufficient documentation

## 2016-08-28 DIAGNOSIS — Z96649 Presence of unspecified artificial hip joint: Secondary | ICD-10-CM | POA: Insufficient documentation

## 2016-09-05 ENCOUNTER — Other Ambulatory Visit: Payer: Self-pay | Admitting: Internal Medicine

## 2016-09-13 ENCOUNTER — Telehealth: Payer: Self-pay | Admitting: *Deleted

## 2016-09-13 NOTE — Telephone Encounter (Signed)
Are you aware of the recall on levothyroxine?

## 2016-09-13 NOTE — Telephone Encounter (Signed)
Left message for patient to return call to our office.  

## 2016-09-13 NOTE — Telephone Encounter (Signed)
She should call her pharmacist to see if her specific levothyroxine is being recalled.  I do not have that information

## 2016-09-13 NOTE — Telephone Encounter (Signed)
Patient stated that she was viewing on TV that Levothyroxine was being recalled . Pt requested a call to discuss a new medication  Pt contact [phone]

## 2016-09-20 NOTE — Telephone Encounter (Signed)
Spoke with pt and informed her of Dr. Lupita Dawn message below. Pt stated that she would give the pharmacy a call and let us know.

## 2016-09-26 IMAGING — US US ABDOMEN LIMITED
1 series · 14 of 25 positions shown · non-contrast
Comparison: None.

CLINICAL DATA: 75-year-old female with right upper quadrant pain

EXAM:
US ABDOMEN LIMITED - RIGHT UPPER QUADRANT

[Series 1: us abdomen limited · 0.20mm/px · 14 of 50 slices shown]
[im 1/50]
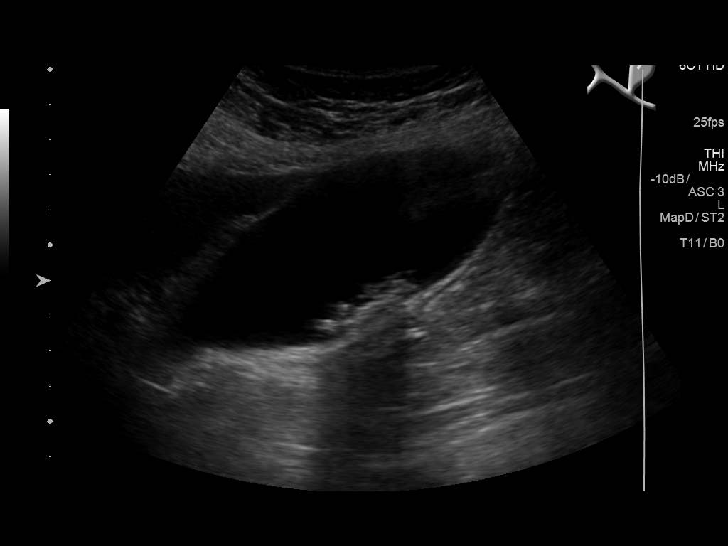
[im 5/50]
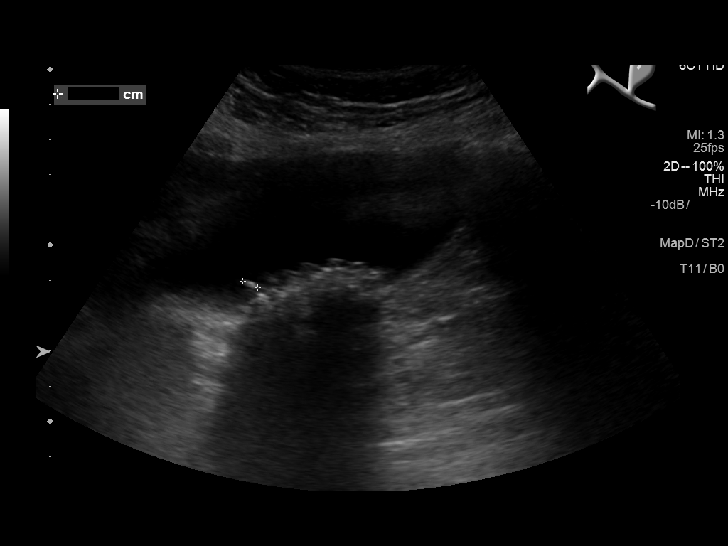
[im 9/50]
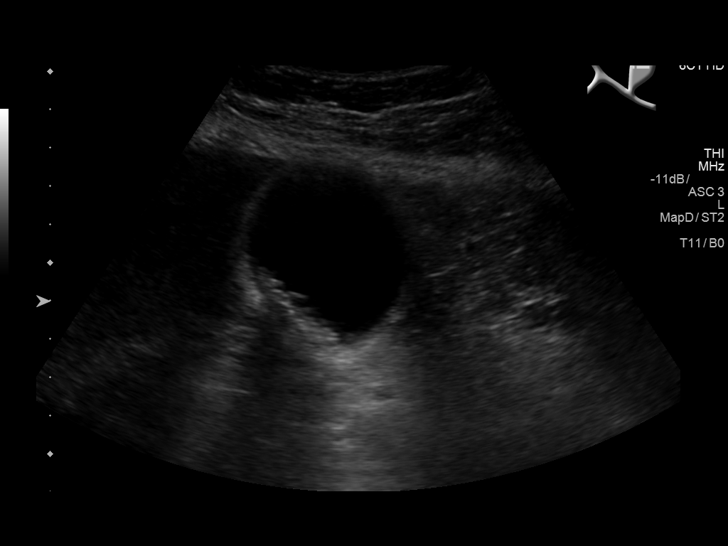
[im 13/50]
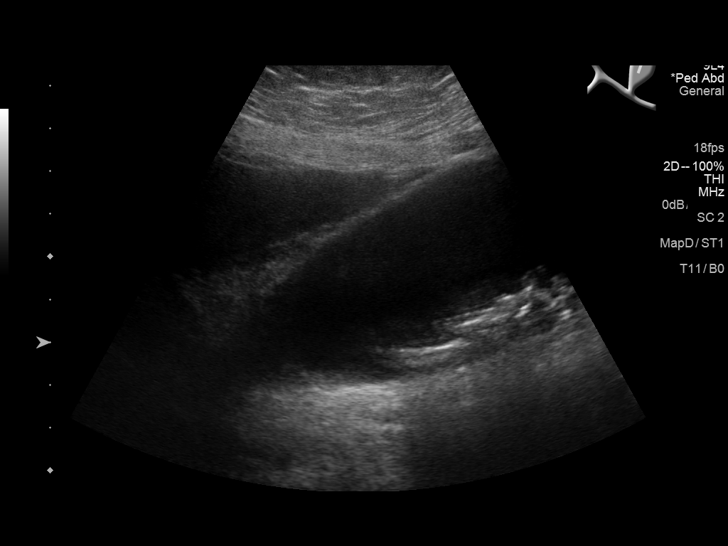
[im 17/50]
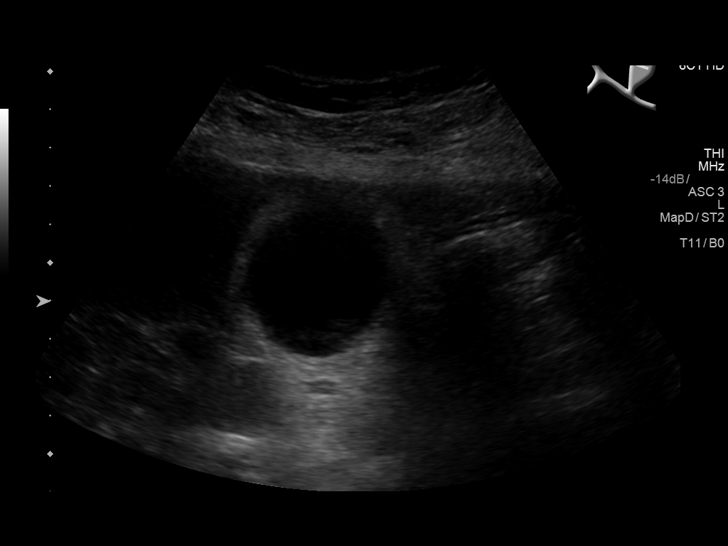
[im 19/50]
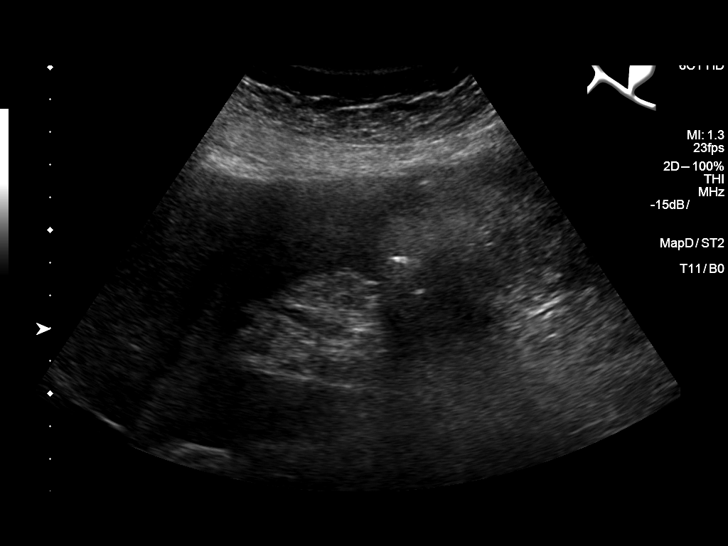
[im 23/50]
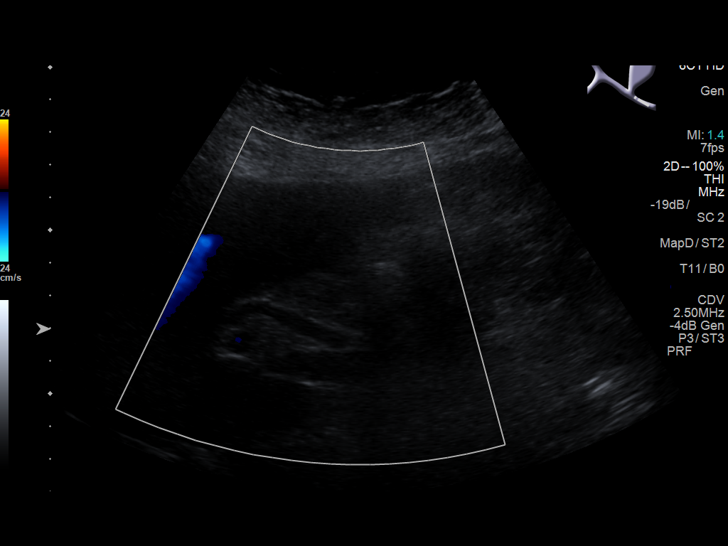
[im 27/50]
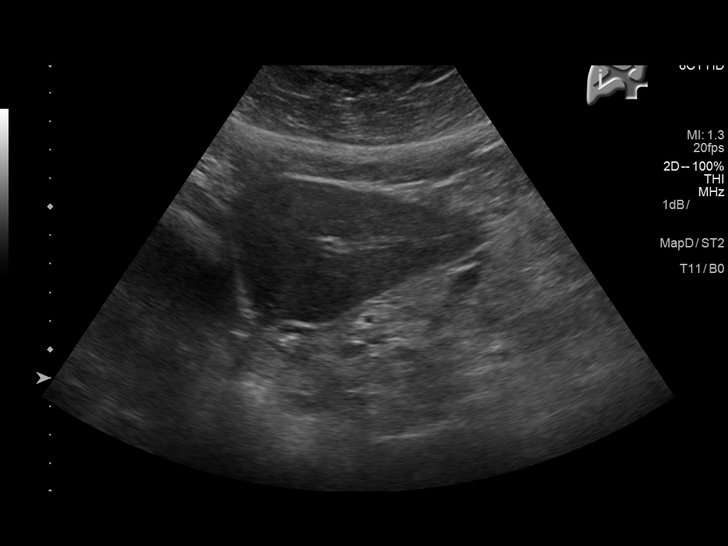
[im 31/50]
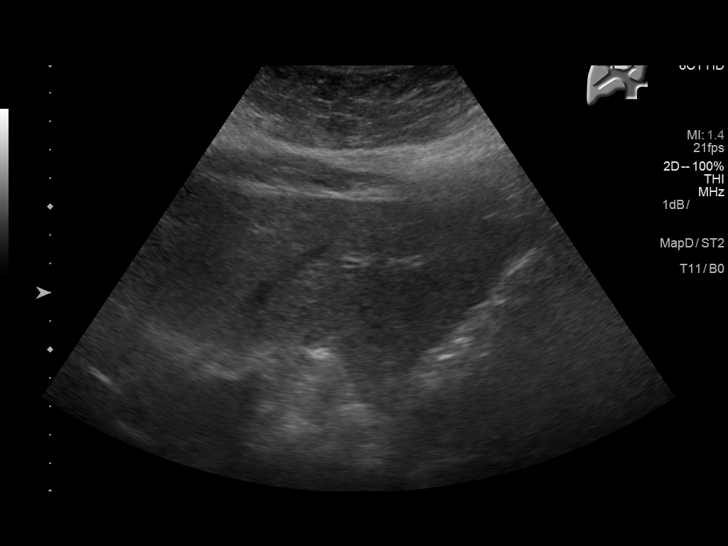
[im 33/50]
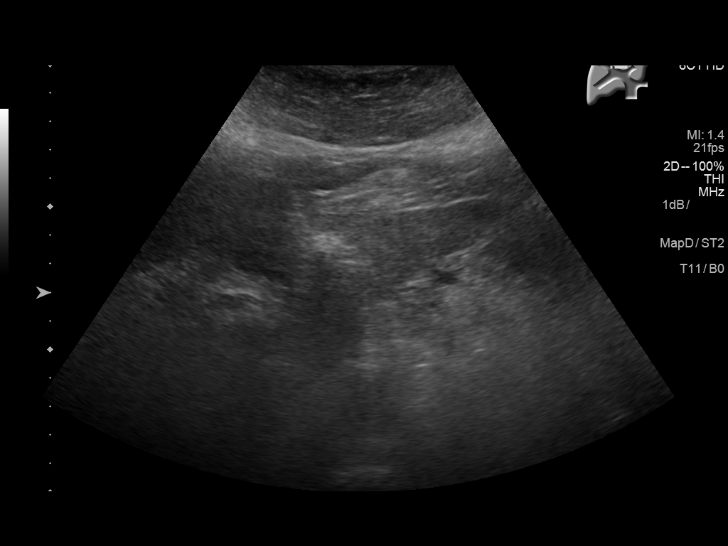
[im 37/50]
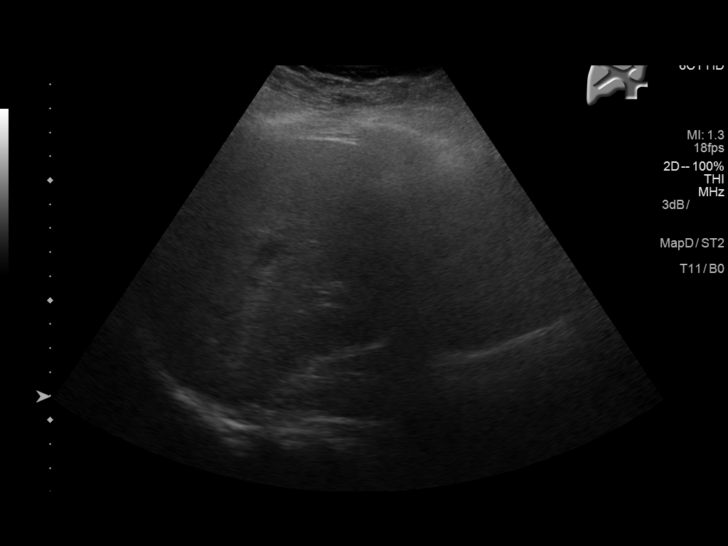
[im 41/50]
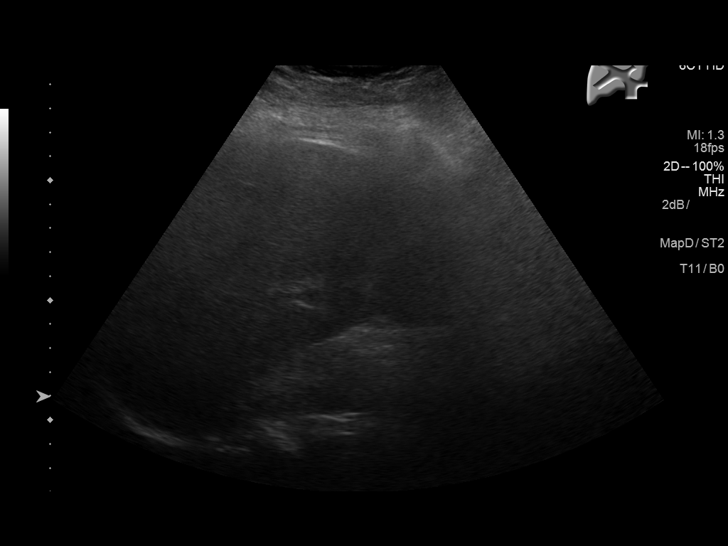
[im 45/50]
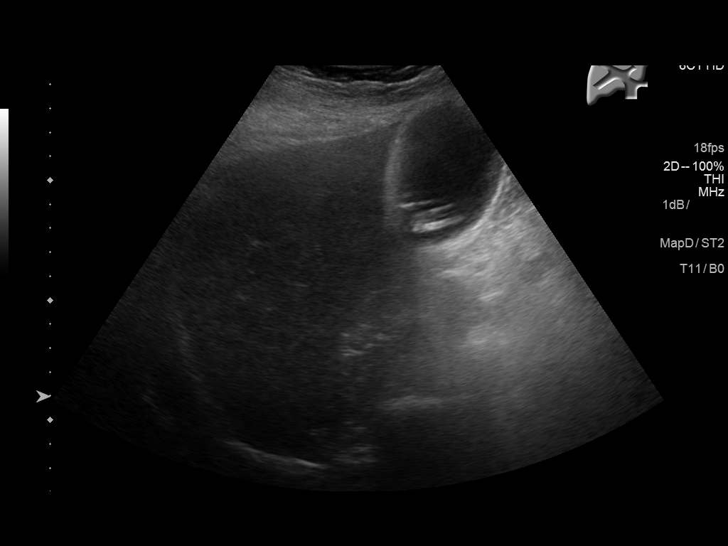
[im 50/50]
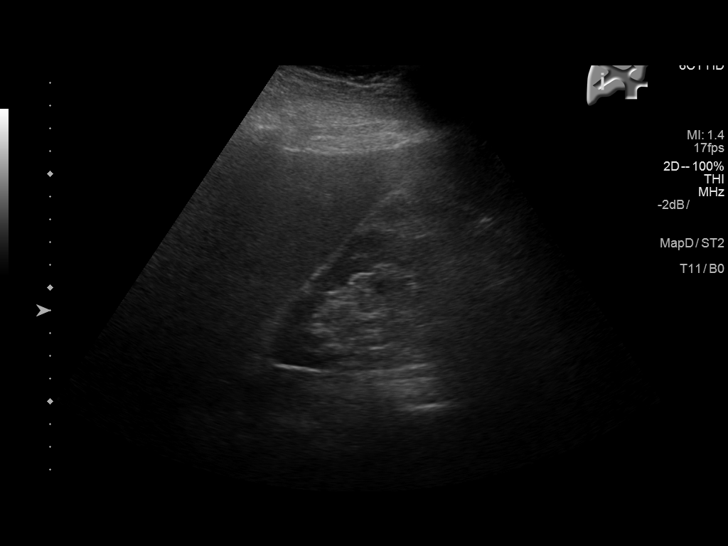

[14 of 25 positions shown; findings below may reference images not displayed]

FINDINGS: Gallbladder:

Multiple small echogenic foci with posterior acoustic shadowing
layer within the gallbladder lumen. The gallbladder is mildly
distended. There is wall thickening measuring up to 6.5 mm. Small
volume pericholecystic fluid is present between the gallbladder and
liver. Per the sonographer, the sonographic Murphy sign was
negative.

Common bile duct:

Diameter: Within normal limits for age at 6 mm

Liver:

No focal lesion identified. Hepatic parenchyma is mildly echogenic
and there is coarsening of the echotexture.
IMPRESSION: 1. Cholelithiasis with mild gallbladder wall thickening and edema
particularly at the interface with the liver. Per the sonographer,
the sonographic Murphy sign was negative. Findings are concerning
for acute cholecystitis in the appropriate clinical setting. Chronic
cholecystitis is also a consideration.
2. Echogenic and coarsened liver parenchyma. Favor hepatic
steatosis.

## 2016-10-10 ENCOUNTER — Other Ambulatory Visit: Payer: Self-pay | Admitting: Internal Medicine

## 2017-01-03 ENCOUNTER — Ambulatory Visit: Payer: PPO | Admitting: Internal Medicine

## 2017-01-03 ENCOUNTER — Encounter: Payer: Self-pay | Admitting: Internal Medicine

## 2017-01-03 VITALS — BP 154/82 | HR 68 | Temp 97.8°F | Resp 15 | Ht 64.5 in | Wt 208.4 lb

## 2017-01-03 DIAGNOSIS — E7800 Pure hypercholesterolemia, unspecified: Secondary | ICD-10-CM

## 2017-01-03 DIAGNOSIS — Z6837 Body mass index (BMI) 37.0-37.9, adult: Secondary | ICD-10-CM

## 2017-01-03 DIAGNOSIS — H5310 Unspecified subjective visual disturbances: Secondary | ICD-10-CM | POA: Diagnosis not present

## 2017-01-03 DIAGNOSIS — M25559 Pain in unspecified hip: Secondary | ICD-10-CM

## 2017-01-03 DIAGNOSIS — M25551 Pain in right hip: Secondary | ICD-10-CM

## 2017-01-03 DIAGNOSIS — R197 Diarrhea, unspecified: Secondary | ICD-10-CM

## 2017-01-03 DIAGNOSIS — E034 Atrophy of thyroid (acquired): Secondary | ICD-10-CM

## 2017-01-03 DIAGNOSIS — I2510 Atherosclerotic heart disease of native coronary artery without angina pectoris: Secondary | ICD-10-CM | POA: Diagnosis not present

## 2017-01-03 DIAGNOSIS — H02403 Unspecified ptosis of bilateral eyelids: Secondary | ICD-10-CM | POA: Diagnosis not present

## 2017-01-03 DIAGNOSIS — K529 Noninfective gastroenteritis and colitis, unspecified: Secondary | ICD-10-CM | POA: Diagnosis not present

## 2017-01-03 NOTE — Progress Notes (Signed)
Subjective:  Patient ID: Meagan Cox, female    DOB: 11/18/1940  Age: 76 y.o. MRN: [id number]  CC: The primary encounter diagnosis was Chronic diarrhea of unknown origin. 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  HPI Meagan Cox presents for multiple complaints:   recurrent diarrhea, not occurring daily  Since visiting her son in [place] .  The trip was 8 months ago,  And the diarrhea initially improved,  But for the last several months has had watery stools alternating  with solid stools .  Not taking magnesium  or stools softeners . Occurs randomly, no relation to eating   No pre movement severe  Cramping just a mild pressure,  Urgency but not incontinence, does not occur at night, no weight loss.    2) has been having recurrent had episodic blurred vision in the right eye.  Denies eye pain, sustained symptoms, no double vision.   3) ptosis of the eyelids blocking her vision worse in the morning  4) needs 90 day refills   5) hip replaced on the right by [facility] in July,  [place] home same day,  Doing fine, went white water rafting in August.     Outpatient Medications Prior to Visit  Medication Sig Dispense Refill  . ALPRAZolam (XANAX) 0.5 MG tablet Take 1 tablet (0.5 mg total) by mouth at bedtime as needed. 30 tablet 3  . benzonatate (TESSALON) 100 MG capsule Take 2 capsules (200 mg total) by mouth 3 (three) times daily. 60 capsule 2  . diclofenac (CATAFLAM) 50 MG tablet TAKE ONE TABLET BY MOUTH TWICE DAILY 180 tablet 1    . estradiol (ESTRACE) 2 MG tablet TAKE 1 TABLET BY MOUTH ONCE DAILY 90 tablet 0  . levothyroxine (SYNTHROID, LEVOTHROID) 125 MCG tablet TAKE 1 TABLET BY MOUTH ONCE DAILY 30 tablet 5  . Multiple Vitamin (MULTIVITAMIN) tablet Take 1 tablet by mouth daily.      . Ivermectin 1 % CREA Apply 1 application topically daily. (Patient not taking: Reported on 07/13/2016) 30 g 0  . Red Yeast Rice 600 MG CAPS Take 1 capsule (600 mg total) by mouth 2 (two) times daily. (Patient not taking: Reported on 07/13/2016) 60 capsule 11  . simvastatin (ZOCOR) 20 MG tablet Take 1 tablet (20 mg total) by mouth at bedtime. (Patient not taking: Reported on 07/13/2016) 90 tablet 3   No facility-administered medications prior to visit.     Review of Systems;  Patient denies headache, fevers, malaise, unintentional weight loss, skin rash, eye pain, sinus congestion and sinus pain, sore throat, dysphagia,  hemoptysis , cough, dyspnea, wheezing, chest pain, palpitations, orthopnea, edema, abdominal pain, nausea, melena, diarrhea, constipation, flank pain, dysuria, hematuria, urinary  Frequency, nocturia, numbness, tingling, seizures,  Focal weakness, Loss of consciousness,  Tremor, insomnia, depression, anxiety, and suicidal ideation.      Objective:  BP (!) 154/82 (BP Location: Left Arm, Patient Position: Sitting, Cuff Size: Normal)   Pulse 68   Temp 97.8 F (36.6 C) (Oral)   Resp 15   Ht 5' 4.5" (1.638 m)   Wt 208  lb 6.4 oz (94.5 kg)   SpO2 98%   BMI 35.22 kg/m   BP Readings from Last 3 Encounters:  01/03/17 (!) 154/82  01/18/16 122/66  01/09/16 138/80    Wt Readings from Last 3 Encounters:  01/03/17 208 lb 6.4 oz (94.5 kg)  01/18/16 210 lb 2 oz (95.3 kg)  01/09/16 206 lb (93.4 kg)    General appearance: alert, cooperative and appears stated age Ears: normal TM's and external ear canals both ears Throat: lips, mucosa, and tongue normal; teeth and gums normal Neck: no adenopathy, no carotid bruit, supple,  symmetrical, trachea midline and thyroid not enlarged, symmetric, no tenderness/mass/nodules Back: symmetric, no curvature. ROM normal. No CVA tenderness. Lungs: clear to auscultation bilaterally Heart: regular rate and rhythm, S1, S2 normal, no murmur, click, rub or gallop Abdomen: soft, non-tender; bowel sounds normal; no masses,  no organomegaly Pulses: 2+ and symmetric Skin: Skin color, texture, turgor normal. No rashes or lesions Lymph nodes: Cervical, supraclavicular, and axillary nodes normal.  No results found for: HGBA1C  Lab Results  Component Value Date   CREATININE 0.66 01/18/2016   CREATININE 0.56 08/21/2015   CREATININE 0.64 07/18/2015    Lab Results  Component Value Date   WBC 10.9 08/21/2015   HGB 15.4 08/21/2015   HCT 44.4 08/21/2015   PLT 226 08/21/2015   GLUCOSE 91 01/18/2016   CHOL 242 (H) 01/18/2016   TRIG 163.0 (H) 01/18/2016   HDL 59.80 01/18/2016   LDLDIRECT 146.0 07/18/2015   LDLCALC 150 (H) 01/18/2016   ALT 11 01/18/2016   AST 15 01/18/2016   NA 140 01/18/2016   K 5.2 (H) 01/18/2016   CL 104 01/18/2016   CREATININE 0.66 01/18/2016   BUN 15 01/18/2016   CO2 27 01/18/2016   TSH 3.18 01/18/2016    Mr Knee Right Wo Contrast  Result Date: 06/28/2016 CLINICAL DATA:  Medial right knee pain with weight-bearing. No known injury. Symptoms are chronic. EXAM: MRI OF THE RIGHT KNEE WITHOUT CONTRAST TECHNIQUE: Multiplanar, multisequence MR imaging of the knee was performed. No intravenous contrast was administered. COMPARISON:  None. FINDINGS: MENISCI Medial meniscus:  Intact. Lateral meniscus: Horizontal tear in the anterior horn is identified. Septated parameniscal cyst along the anterior horn measures 2.4 cm transverse by 0.5 cm craniocaudal by up to 0.6 cm AP. Globular increased T2 signal is seen in the anterior 50% of the body. LIGAMENTS Cruciates: The patient has a chronic, complete ACL tear. The PCL is intact. Collaterals:  Intact. CARTILAGE  Patellofemoral:  Negative. Medial:  Negative. Lateral:  Negative. Joint:  Trace joint effusion. Popliteal Fossa:  No Baker's cyst. Extensor Mechanism:  Intact. Bones:  No fracture or worrisome lesion. Other: None. IMPRESSION: Chronic, complete ACL tear. Large horizontal tear throughout the anterior horn of the lateral meniscus with associated parameniscal cyst formation. Electronically Signed   By: Inge Rise M.D.   On: 06/28/2016 15:02    Assessment & Plan:   Problem List Items Addressed This Visit    CAD (coronary artery disease)    No recent stress testing per patient preference.  Goal LDL is 70.  She is willing to continue aspirin   Lab Results  Component Value Date   CHOL 242 (H) 01/18/2016   HDL 59.80 01/18/2016   LDLCALC 150 (H) 01/18/2016   LDLDIRECT 146.0 07/18/2015   TRIG 163.0 (H) 01/18/2016   CHOLHDL 4 01/18/2016         Diarrhea in adult patient    Present for several  month m with n weight loss or warning signs.  Screening for infectios causes. 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 with good results      Hyperlipidemia    Using the Framingham risk calculator, her  10 year risk of coronary artery disease events is 11%.She has been taking RED YEAST RICE  INTERMITTENTLY, did not tolerate  simvastatin  Lab Results  Component Value Date   CHOL 242 (H) 01/18/2016   HDL 59.80 01/18/2016   LDLCALC 150 (H) 01/18/2016   LDLDIRECT 146.0 07/18/2015   TRIG 163.0 (H) 01/18/2016   CHOLHDL 4 01/18/2016         Hypothyroidism   Relevant Orders   CBC with Differential/Platelet   Comprehensive metabolic panel   TSH   Obesity    I have addressed  BMI and recommended a low glycemic index diet utilizing smaller more frequent meals to increase metabolism.  I have also recommended that patient start exercising with a goal of 30 minutes of aerobic exercise a minimum of 5 days per week.        Pain in joint, pelvic region and thigh   Ptosis, both eyelids    Interfering with vision. Advised to see ophthalmology       Other Visit Diagnoses    Chronic diarrhea of unknown origin    -  Primary   Relevant Orders   Stool culture   Stool, WBC/Lactoferrin   Gastrointestinal Panel by PCR , Stool   Sedimentation rate      I have discontinued Elnita Maxwell. Alcindor's Ivermectin, Red Yeast Rice, and simvastatin. I am also having her maintain her multivitamin, ALPRAZolam, benzonatate, diclofenac, estradiol, and levothyroxine.  No orders of the defined types were placed in this encounter.   Medications Discontinued During This Encounter  Medication Reason  . Ivermectin 1 % CREA Patient has not taken in last 30 days  . Red Yeast Rice 600 MG CAPS Patient has not taken in last 30 days  . simvastatin (ZOCOR) 20 MG tablet Patient has not taken in last 30 days    Follow-up: No Follow-up on file.   Crecencio Mc, MD

## 2017-01-03 NOTE — Patient Instructions (Signed)
Please collect your LIQUID stool usng the "hat" , place several cc's into each tube and return to [place] at your earliest convenience   Your vision change may be a cataract or macular degeneration, Please make an appt with your eye doctor soon.  Your eyelid drop is called Ptosis ("toe sis") and may cbe correctable with surgery.  Talk to your eye doctor about it

## 2017-01-04 DIAGNOSIS — H02403 Unspecified ptosis of bilateral eyelids: Secondary | ICD-10-CM | POA: Insufficient documentation

## 2017-01-04 DIAGNOSIS — R197 Diarrhea, unspecified: Secondary | ICD-10-CM | POA: Insufficient documentation

## 2017-01-04 NOTE — Assessment & Plan Note (Signed)
No recent stress testing per patient preference.  Goal LDL is 70.  She is willing to continue aspirin   Lab Results  Component Value Date   CHOL 242 (H) 01/18/2016   HDL 59.80 01/18/2016   LDLCALC 150 (H) 01/18/2016   LDLDIRECT 146.0 07/18/2015   TRIG 163.0 (H) 01/18/2016   CHOLHDL 4 01/18/2016

## 2017-01-04 NOTE — Assessment & Plan Note (Signed)
S/p hip replacement In July with good results

## 2017-01-04 NOTE — Assessment & Plan Note (Deleted)
I have addressed  BMI and recommended a low glycemic index diet utilizing smaller more frequent meals to increase metabolism.  I have also recommended that patient start exercising with a goal of 30 minutes of aerobic exercise a minimum of 5 days per week. Screening for lipid disorders, thyroid and diabetes has been done .  No results found for: HGBA1C Lab Results  Component Value Date   TSH 3.18 01/18/2016   Lab Results  Component Value Date   CHOL 242 (H) 01/18/2016   HDL 59.80 01/18/2016   LDLCALC 150 (H) 01/18/2016   LDLDIRECT 146.0 07/18/2015   TRIG 163.0 (H) 01/18/2016   CHOLHDL 4 01/18/2016

## 2017-01-04 NOTE — Assessment & Plan Note (Signed)
Using the Framingham risk calculator, her  10 year risk of coronary artery disease events is 11%.She has been taking RED YEAST RICE  INTERMITTENTLY, did not tolerate  simvastatin  Lab Results  Component Value Date   CHOL 242 (H) 01/18/2016   HDL 59.80 01/18/2016   LDLCALC 150 (H) 01/18/2016   LDLDIRECT 146.0 07/18/2015   TRIG 163.0 (H) 01/18/2016   CHOLHDL 4 01/18/2016

## 2017-01-04 NOTE — Assessment & Plan Note (Signed)
Interfering with vision. Advised to see ophthalmology

## 2017-01-04 NOTE — Assessment & Plan Note (Addendum)
Present for several month m with n weight loss or warning signs.  Screening for infectios causes. She is not interested in GI referral if infection is ruled out

## 2017-01-04 NOTE — Assessment & Plan Note (Signed)
I have addressed  BMI and recommended a low glycemic index diet utilizing smaller more frequent meals to increase metabolism.  I have also recommended that patient start exercising with a goal of 30 minutes of aerobic exercise a minimum of 5 days per week.  

## 2017-01-08 ENCOUNTER — Ambulatory Visit: Payer: PPO

## 2017-02-19 ENCOUNTER — Other Ambulatory Visit: Payer: Self-pay | Admitting: Internal Medicine

## 2017-02-19 MED ORDER — ESTRADIOL 2 MG PO TABS: 2.0000 mg | ORAL_TABLET | Freq: Every day | ORAL | 0 refills | Status: DC

## 2017-03-03 ENCOUNTER — Encounter: Payer: Self-pay | Admitting: Internal Medicine

## 2017-03-04 ENCOUNTER — Ambulatory Visit (INDEPENDENT_AMBULATORY_CARE_PROVIDER_SITE_OTHER): Payer: PPO | Admitting: Internal Medicine

## 2017-03-04 ENCOUNTER — Encounter: Payer: Self-pay | Admitting: Internal Medicine

## 2017-03-04 ENCOUNTER — Telehealth: Payer: Self-pay | Admitting: Internal Medicine

## 2017-03-04 VITALS — BP 160/88 | HR 70 | Temp 97.7°F | Resp 15 | Ht 64.5 in | Wt 211.4 lb

## 2017-03-04 DIAGNOSIS — R030 Elevated blood-pressure reading, without diagnosis of hypertension: Secondary | ICD-10-CM

## 2017-03-04 DIAGNOSIS — R300 Dysuria: Secondary | ICD-10-CM

## 2017-03-04 DIAGNOSIS — E7800 Pure hypercholesterolemia, unspecified: Secondary | ICD-10-CM | POA: Diagnosis not present

## 2017-03-04 DIAGNOSIS — I2510 Atherosclerotic heart disease of native coronary artery without angina pectoris: Secondary | ICD-10-CM

## 2017-03-04 DIAGNOSIS — E034 Atrophy of thyroid (acquired): Secondary | ICD-10-CM

## 2017-03-04 DIAGNOSIS — Z9049 Acquired absence of other specified parts of digestive tract: Secondary | ICD-10-CM

## 2017-03-04 DIAGNOSIS — N3090 Cystitis, unspecified without hematuria: Secondary | ICD-10-CM | POA: Diagnosis not present

## 2017-03-04 LAB — POCT URINALYSIS DIPSTICK
Bilirubin, UA: NEGATIVE
Glucose, UA: NEGATIVE
KETONES UA: NEGATIVE
PH UA: 6 (ref 5.0–8.0)
PROTEIN UA: NEGATIVE
Spec Grav, UA: 1.025 (ref 1.010–1.025)
UROBILINOGEN UA: 0.2 U/dL

## 2017-03-04 MED ORDER — CIPROFLOXACIN HCL 250 MG PO TABS
250.0000 mg | ORAL_TABLET | Freq: Two times a day (BID) | ORAL | 0 refills | Status: DC
Start: 2017-03-04 — End: 2017-03-08

## 2017-03-04 NOTE — Telephone Encounter (Signed)
[name]   I do not routinely  call in antibiotics based on e mails.  Certainly not without a collection of urine .  If you are willing to come by this afternoon,  We can squeeze you in for an urgent visit with me.  The office will call you.   Regards,   Deborra Medina, MD

## 2017-03-04 NOTE — Progress Notes (Signed)
Subjective:  Patient ID: Meagan Cox, female    DOB: 1940/12/08  Age: 77 y.o. MRN: [id number]  CC: The primary encounter diagnosis was Dysuria. Diagnoses of Hypothyroidism due to acquired atrophy of thyroid, Pure hypercholesterolemia, Cystitis, Elevated blood pressure reading without diagnosis of hypertension, Coronary artery disease involving native heart without angina pectoris, unspecified vessel or lesion type, and S/P laparoscopic cholecystectomy were also pertinent to this visit.  HPI Meagan Cox presents for evaluation of dysuria and urinary hesitancy. Symptoms started 2 days ago (Saturday) .  She inreased in intake of  Beer,  water and cranberry  In hopes of eradicating the symptoms but she has developed suprapubic tenderness , frequency, and dysuria.  Patient was unable to provide more than 5 ccs of urine in the office today because she voided before she left home. She is not sexually active.  Does not swim or use hot tubs.  No recent long trips .  Uses estrogen for HRT so does not have any preexisting symptoms of atrophic vaginitis     Outpatient Medications Prior to Visit  Medication Sig Dispense Refill  . ALPRAZolam (XANAX) 0.5 MG tablet Take 1 tablet (0.5 mg total) by mouth at bedtime as needed. 30 tablet 3  . benzonatate (TESSALON) 100 MG capsule Take 2 capsules (200 mg total) by mouth 3 (three) times daily. 60 capsule 2  . diclofenac (CATAFLAM) 50 MG tablet TAKE ONE TABLET BY MOUTH TWICE DAILY 180 tablet 1  . estradiol (ESTRACE) 2 MG tablet Take 1 tablet (2 mg total) by mouth daily. 90 tablet 0  . levothyroxine (SYNTHROID, LEVOTHROID) 125 MCG tablet TAKE 1 TABLET BY MOUTH ONCE DAILY 30 tablet 5  . Multiple Vitamin (MULTIVITAMIN) tablet Take 1 tablet by mouth daily.       No facility-administered medications prior to visit.     Review of Systems;  Patient denies headache, fevers, malaise, unintentional weight loss, skin rash, eye pain, sinus congestion and sinus  pain, sore throat, dysphagia,  hemoptysis , cough, dyspnea, wheezing, chest pain, palpitations, orthopnea, edema, abdominal pain, nausea, melena, diarrhea, constipation, flank pain,  numbness, tingling, seizures,  Focal weakness, Loss of consciousness,  Tremor, insomnia, depression, anxiety, and suicidal ideation.      Objective:  BP (!) 160/88 (BP Location: Left Arm, Patient Position: Sitting, Cuff Size: Normal)   Pulse 70   Temp 97.7 F (36.5 C) (Oral)   Resp 15   Ht 5' 4.5" (1.638 m)   Wt 211 lb 6.4 oz (95.9 kg)   SpO2 97%   BMI 35.73 kg/m   BP Readings from Last 3 Encounters:  03/04/17 (!) 160/88  01/03/17 (!) 154/82  01/18/16 122/66    Wt Readings from Last 3 Encounters:  03/04/17 211 lb 6.4 oz (95.9 kg)  01/03/17 208 lb 6.4 oz (94.5 kg)  01/18/16 210 lb 2 oz (95.3 kg)    General appearance: alert, cooperative and appears stated age Back: symmetric, no curvature. ROM normal. No CVA tenderness. Lungs: clear to auscultation bilaterally Heart: regular rate and rhythm, S1, S2 normal, no murmur, click, rub or gallop Abdomen: soft, non-tender; bowel sounds normal; no masses,  no organomegaly   No results found for: HGBA1C  Lab Results  Component Value Date   CREATININE 0.66 01/18/2016   CREATININE 0.56 08/21/2015   CREATININE 0.64 07/18/2015    Lab Results  Component Value Date   WBC 10.9 08/21/2015   HGB 15.4 08/21/2015   HCT 44.4 08/21/2015   PLT 226  08/21/2015   GLUCOSE 91 01/18/2016   CHOL 242 (H) 01/18/2016   TRIG 163.0 (H) 01/18/2016   HDL 59.80 01/18/2016   LDLDIRECT 146.0 07/18/2015   LDLCALC 150 (H) 01/18/2016   ALT 11 01/18/2016   AST 15 01/18/2016   NA 140 01/18/2016   K 5.2 (H) 01/18/2016   CL 104 01/18/2016   CREATININE 0.66 01/18/2016   BUN 15 01/18/2016   CO2 27 01/18/2016   TSH 3.18 01/18/2016    Mr Knee Right Wo Contrast  Result Date: 06/28/2016 CLINICAL DATA:  Medial right knee pain with weight-bearing. No known injury. Symptoms  are chronic. EXAM: MRI OF THE RIGHT KNEE WITHOUT CONTRAST TECHNIQUE: Multiplanar, multisequence MR imaging of the knee was performed. No intravenous contrast was administered. COMPARISON:  None. FINDINGS: MENISCI Medial meniscus:  Intact. Lateral meniscus: Horizontal tear in the anterior horn is identified. Septated parameniscal cyst along the anterior horn measures 2.4 cm transverse by 0.5 cm craniocaudal by up to 0.6 cm AP. Globular increased T2 signal is seen in the anterior 50% of the body. LIGAMENTS Cruciates: The patient has a chronic, complete ACL tear. The PCL is intact. Collaterals:  Intact. CARTILAGE Patellofemoral:  Negative. Medial:  Negative. Lateral:  Negative. Joint:  Trace joint effusion. Popliteal Fossa:  No Baker's cyst. Extensor Mechanism:  Intact. Bones:  No fracture or worrisome lesion. Other: None. IMPRESSION: Chronic, complete ACL tear. Large horizontal tear throughout the anterior horn of the lateral meniscus with associated parameniscal cyst formation. Electronically Signed   By: Inge Rise M.D.   On: 06/28/2016 15:02    Assessment & Plan:   Problem List Items Addressed This Visit    S/P laparoscopic cholecystectomy   Hypothyroidism    She is long overdue for assessment of thyroid function on current replacement regimen.  TSH ordered   Lab Results  Component Value Date   TSH 3.18 01/18/2016         Relevant Orders   TSH   Hyperlipidemia   Relevant Orders   Comprehensive metabolic panel   Lipid panel   Elevated blood pressure reading without diagnosis of hypertension    She has no history of hypertension but has had two elevated readings during acute visits .  She has been asked to check her pressures at home and submit readings for evaluation. Renal function will be checked today      Cystitis    Empiric treatment with cipro 250 mg bid x 5 days.  If symptoms persist she will need to produce additional urine for culture. Probiotic advised.       CAD  (coronary artery disease)    She has had no cardiology follow up in 5 years,  No recent stress testing per patient preference.  Goal LDL is 70.  She is willing to continue aspirin  And have lipids repeated today   Lab Results  Component Value Date   CHOL 242 (H) 01/18/2016   HDL 59.80 01/18/2016   LDLCALC 150 (H) 01/18/2016   LDLDIRECT 146.0 07/18/2015   TRIG 163.0 (H) 01/18/2016   CHOLHDL 4 01/18/2016          Other Visit Diagnoses    Dysuria    -  Primary   Relevant Orders   POCT urinalysis dipstick (Completed)   Urine Culture      I am having Elnita Maxwell. Glascoe start on ciprofloxacin. I am also having her maintain her multivitamin, ALPRAZolam, benzonatate, diclofenac, levothyroxine, and estradiol.  Meds ordered this encounter  Medications  . ciprofloxacin (CIPRO) 250 MG tablet    Sig: Take 1 tablet (250 mg total) by mouth 2 (two) times daily.    Dispense:  10 tablet    Refill:  0    There are no discontinued medications.  Follow-up: No Follow-up on file.   Crecencio Mc, MD

## 2017-03-04 NOTE — Patient Instructions (Signed)
You appear to have a UTI as suspected   I have prescribed Cipro to take 2 times daily  for 5 days  Please take a probiotic ( Align, Floraque or Culturelle) or eat a serving of yogurt daily for 3 weeks to prevent a serious antibiotic associated diarrhea  Called" clostridium dificile colitis" ( should also help prevent   vaginal yeast infection)   If your symptoms are not gone after 3 days,  Return with a full bladder so we can run a culture

## 2017-03-04 NOTE — Telephone Encounter (Signed)
Can you please schedule this pt for 3:15pm this afternoon for a UTI per Dr. Lupita Dawn okay. Thank you!

## 2017-03-05 DIAGNOSIS — Z9049 Acquired absence of other specified parts of digestive tract: Secondary | ICD-10-CM | POA: Insufficient documentation

## 2017-03-05 DIAGNOSIS — R030 Elevated blood-pressure reading, without diagnosis of hypertension: Secondary | ICD-10-CM | POA: Insufficient documentation

## 2017-03-05 DIAGNOSIS — I10 Essential (primary) hypertension: Secondary | ICD-10-CM | POA: Insufficient documentation

## 2017-03-05 DIAGNOSIS — N3090 Cystitis, unspecified without hematuria: Secondary | ICD-10-CM | POA: Insufficient documentation

## 2017-03-05 LAB — COMPREHENSIVE METABOLIC PANEL
ALBUMIN: 3.8 g/dL (ref 3.5–5.2)
ALK PHOS: 59 U/L (ref 39–117)
ALT: 8 U/L (ref 0–35)
AST: 14 U/L (ref 0–37)
BUN: 14 mg/dL (ref 6–23)
CALCIUM: 8.6 mg/dL (ref 8.4–10.5)
CHLORIDE: 106 meq/L (ref 96–112)
CO2: 29 mEq/L (ref 19–32)
Creatinine, Ser: 0.69 mg/dL (ref 0.40–1.20)
GFR: 88.13 mL/min (ref >60.00)
Glucose, Bld: 99 mg/dL (ref 70–99)
POTASSIUM: 4 meq/L (ref 3.5–5.1)
Sodium: 141 mEq/L (ref 135–145)
Total Bilirubin: 0.3 mg/dL (ref 0.2–1.2)
Total Protein: 6.5 g/dL (ref 6.0–8.3)

## 2017-03-05 LAB — LIPID PANEL
CHOLESTEROL: 213 mg/dL — AB (ref 0–200)
HDL: 60.6 mg/dL (ref >39.00)
LDL Cholesterol: 113 mg/dL — ABNORMAL HIGH (ref 0–99)
NonHDL: 152.66
TRIGLYCERIDES: 200 mg/dL — AB (ref 0.0–149.0)
Total CHOL/HDL Ratio: 4
VLDL: 40 mg/dL (ref 0.0–40.0)

## 2017-03-05 LAB — TSH: TSH: 2.05 u[IU]/mL (ref 0.35–4.50)

## 2017-03-05 NOTE — Assessment & Plan Note (Signed)
Empiric treatment with cipro 250 mg bid x 5 days.  If symptoms persist she will need to produce additional urine for culture. Probiotic advised.

## 2017-03-05 NOTE — Assessment & Plan Note (Signed)
She has no history of hypertension but has had two elevated readings during acute visits .  She has been asked to check her pressures at home and submit readings for evaluation. Renal function will be checked today

## 2017-03-05 NOTE — Assessment & Plan Note (Addendum)
She has had no cardiology follow up in 5 years,  No recent stress testing per patient preference.  Goal LDL is 70.  She is willing to continue aspirin  And have lipids repeated today   Lab Results  Component Value Date   CHOL 242 (H) 01/18/2016   HDL 59.80 01/18/2016   LDLCALC 150 (H) 01/18/2016   LDLDIRECT 146.0 07/18/2015   TRIG 163.0 (H) 01/18/2016   CHOLHDL 4 01/18/2016

## 2017-03-05 NOTE — Assessment & Plan Note (Signed)
She is long overdue for assessment of thyroid function on current replacement regimen.  TSH ordered   Lab Results  Component Value Date   TSH 3.18 01/18/2016

## 2017-03-06 ENCOUNTER — Encounter: Payer: Self-pay | Admitting: Internal Medicine

## 2017-03-08 ENCOUNTER — Other Ambulatory Visit: Payer: Self-pay | Admitting: Internal Medicine

## 2017-03-08 LAB — URINE CULTURE
MICRO NUMBER: 90147421
SPECIMEN QUALITY: ADEQUATE

## 2017-03-08 MED ORDER — LEVOFLOXACIN 500 MG PO TABS: 500.0000 mg | ORAL_TABLET | Freq: Every day | ORAL | 0 refills | Status: DC

## 2017-03-13 DIAGNOSIS — H02831 Dermatochalasis of right upper eyelid: Secondary | ICD-10-CM | POA: Diagnosis not present

## 2017-03-13 DIAGNOSIS — H02834 Dermatochalasis of left upper eyelid: Secondary | ICD-10-CM | POA: Diagnosis not present

## 2017-03-17 ENCOUNTER — Other Ambulatory Visit: Payer: Self-pay | Admitting: Internal Medicine

## 2017-03-27 ENCOUNTER — Telehealth: Payer: Self-pay | Admitting: Internal Medicine

## 2017-03-27 NOTE — Telephone Encounter (Signed)
Copied from [place]. Topic: Quick Communication - See Telephone Encounter >> Mar 27, 2017 11:04 AM Antonieta Iba C wrote: CRM for notification. See Telephone encounter for: pt's insurance called in to complete PA for medication  estradiol (ESTRACE) 2 MG tablet  over the phone. Please call back to complete. [phone] opt 0 then Opt 3 then opt 1 Ref# [id number]  03/27/17.

## 2017-03-27 NOTE — Telephone Encounter (Signed)
FYI Called to initiate prior authorization. Spoke to eddie. Gave diagnosis of postmenopausal estrogen deficiency per OV note.We should receive fax in the next 42 hours.

## 2017-03-29 NOTE — Telephone Encounter (Signed)
PA has been approved from 03/27/2017 - 01/28/2018. Pt and pharmacy are aware of the approval.

## 2017-04-06 ENCOUNTER — Other Ambulatory Visit: Payer: Self-pay | Admitting: Internal Medicine

## 2017-04-18 ENCOUNTER — Encounter: Payer: Self-pay | Admitting: Internal Medicine

## 2017-04-18 DIAGNOSIS — M545 Low back pain: Secondary | ICD-10-CM

## 2017-04-18 DIAGNOSIS — M79604 Pain in right leg: Secondary | ICD-10-CM

## 2017-05-15 ENCOUNTER — Other Ambulatory Visit: Payer: Self-pay

## 2017-05-15 ENCOUNTER — Ambulatory Visit: Payer: PPO | Attending: Internal Medicine | Admitting: Physical Therapy

## 2017-05-15 DIAGNOSIS — R2689 Other abnormalities of gait and mobility: Secondary | ICD-10-CM | POA: Diagnosis not present

## 2017-05-15 DIAGNOSIS — M791 Myalgia, unspecified site: Secondary | ICD-10-CM

## 2017-05-15 DIAGNOSIS — R293 Abnormal posture: Secondary | ICD-10-CM | POA: Diagnosis not present

## 2017-05-15 DIAGNOSIS — M2170 Unequal limb length (acquired), unspecified site: Secondary | ICD-10-CM | POA: Diagnosis not present

## 2017-05-17 NOTE — Patient Instructions (Signed)
   Standing:  10 reps on both sides x 3 x day     3 point tap   Feet are hip width Tap forward, center under hip not feet next to each other  Tap middle\, center  Tap back      _______   Knee to chest  On B  5 breaths

## 2017-05-17 NOTE — Therapy (Addendum)
[place] MAIN [facility] SERVICES [address] [place], [place], [address] Phone: [phone]   Fax:  [phone]  Physical Therapy Evaluation  Patient Details  Name: Meagan Cox MRN: [id number] Date of Birth: 1940-02-21 Referring Provider: Derrel Nip   Encounter Date: 05/15/2017    Past Medical History:  Diagnosis Date  . Hyperlipidemia   . Hypertension   . Hypothyroidism     Past Surgical History:  Procedure Laterality Date  . ABDOMINAL HYSTERECTOMY    . CARDIAC CATHETERIZATION     30 years ago, [place]  . CHOLECYSTECTOMY N/A 08/21/2015   Procedure: LAPAROSCOPIC CHOLECYSTECTOMY;  Surgeon: Jules Husbands, MD;  Location: [facility] ORS;  Service: General;  Laterality: N/A;  . PARTIAL HYSTERECTOMY    . TONSILLECTOMY      There were no vitals filed for this visit.   Subjective Assessment - 05/17/17 1237    Subjective  Pt reports her L leg is 1/2 inch shorter than the RLE after she had THA on her R THA June 2018.  Pt has place a shoe lift in her L shoe and it has helped but pt feels more off balanced. Pt fell on 4/ 15/19 at home on uneven ground and she caught herself on her wrist. Pt had sciatic pain from the buttock to back of L knee after the surgery. The pain increased 2-3/10 and now has progressed to 7-8/10.  This pain comes and goes and pt is not aware of the triggers. Pt reports she is able to sleep but it takes a while to get to sleep because her knees bother her.        Patient Stated Goals  "quit hurting so much"         [facility] PT Assessment - 05/17/17 1233      Assessment   Medical Diagnosis  low back radiating pain    Referring Provider  T[place]o      Precautions   Precautions  None      Restrictions   Weight Bearing Restrictions  No      Balance Screen   Has the patient fallen in the past 6 months  Yes    How many times?  1    Has the patient had a decrease in activity level because of a fear of falling?   No    Is the  patient reluctant to leave their home because of a fear of falling?   No      Prior Function   Level of Independence  Independent      Observation/Other Assessments   Observations  L shoulder lower than R, supine, ASIS to heel R 37 in, L 36 in      Sit to Stand   Comments  5 x to stand: 14.10 sec with BUE on armrests       Strength   Overall Strength  Other (comment) hip L hip /knee flex 3-/5, R 4/5.knee ext 3-5 L, 4/5 R        Palpation   Palpation comment  increased hypomobility on L SIJ, increased mm tensions along lateral border of sacrum, coccygeus, hamstring , attachments at ischial tuberosity                  Objective measurements completed on examination: See above findings.      [facility] Adult PT Treatment/Exercise - 05/17/17 1235      Exercises   Exercises  -- see pt instructionsa  Manual Therapy   Manual therapy comments  PA mob at L HIP Grade III, STM/ MWM to release at L SIJ border , coccygeus, ischial tuberosity                    PT Long Term Goals - 05/22/17 1512      PT LONG TERM GOAL #1   Title  Pt will increase her gait speed from 0.8 m/s to >1.2 m/s  in order to ambulate in the community     Time  12    Period  Weeks    Status  New      PT LONG TERM GOAL #2   Title  improve 5 x to stand: 14.10 sec with BUE on armrests to < 13 sec without BUE on armrests     Time  8    Period  Weeks    Status  New      PT LONG TERM GOAL #3   Title  Pt will demo no increased mm tensions on L at glut med, IT band in order to minize pain and walk with less risk for falls    Time  4    Period  Weeks    Status  New    Target Date  06/19/17      PT LONG TERM GOAL #4   Title  Pt will demo LEFS score of 36 pts to > 50 pts in order to restore function     Time  8    Period  Weeks    Status  New    Target Date  07/17/17           Plan - 05/17/17 1233    Clinical Impression Statement  Pt is a 77 yo female with leg length difference ( L  shorter > R) following her R THA in June 2018. Pt also has have sciatic pain in her LLE following her surgery. Pt has had a recent fall while walking on uneven ground 2 days ago. These deficits impact her QOL and ADLs. Pt's clinical presentation includes increased glut/ pelvic floor mm tightness, limited hip mobility,  pelvic obliquities, and sacroiliac joint hypomobility. Following today's session, pt demo'd decreased glut/pelvic floor mm tightness and increased SIJ mobility. Pt has been recommended to buy Even UP shoe lift that fitting under entire shoe sole to accommodate for leg length difference and pelvic obliquities.        Clinical Presentation  Evolving    Clinical Decision Making  Moderate    Rehab Potential  Good    PT Frequency  1x / week    PT Treatment/Interventions  ADLs/Self Care Home Management;Aquatic Therapy;Gait training;Traction;Moist Heat;Stair training;Functional mobility training;Therapeutic activities;Therapeutic exercise;Balance training;Neuromuscular re-education;Electrical Stimulation;Cryotherapy;Manual techniques;Patient/family education;Passive range of motion;Dry needling;Energy conservation    Consulted and Agree with Plan of Care  Patient       Patient will benefit from skilled therapeutic intervention in order to improve the following deficits and impairments:  Abnormal gait, Pain, Improper body mechanics, Postural dysfunction, Other (comment), Decreased mobility, Decreased coordination, Decreased activity tolerance, Decreased endurance, Decreased balance, Decreased safety awareness, Impaired flexibility, Decreased range of motion, Obesity, Decreased strength, Cardiopulmonary status limiting activity, Difficulty walking, Decreased scar mobility  Visit Diagnosis: Leg length difference, acquired  Other abnormalities of gait and mobility  Myalgia     Problem List Patient Active Problem List   Diagnosis Date Noted  . Cystitis 03/05/2017  . Elevated blood  pressure reading without diagnosis  of hypertension 03/05/2017  . S/P laparoscopic cholecystectomy 03/05/2017  . Ptosis, both eyelids 01/04/2017  . Diarrhea in adult patient 01/04/2017  . Medicare annual wellness visit, subsequent 07/24/2015  . Cerebrovascular small vessel disease 04/24/2015  . Benign paroxysmal positional vertigo 04/24/2015  . Pain in joint, pelvic region and thigh 04/24/2015  . Counseling for travel 01/11/2015  . Shoulder pain, right 11/22/2013  . Statin intolerance 10/08/2012  . Edema 05/14/2012  . Generalized anxiety disorder 01/06/2012  . 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 Hypothyroidism 12/18/2010  . Low back pain radiating to both legs 12/18/2010  . Hip pain, right 12/18/2010  . Obesity 05/09/2010  . CAD (coronary artery disease) 05/09/2010  . Hyperlipidemia 05/09/2010    Jerl Mina ,PT, DPT, E-RYT  05/17/2017, 12:38 PM  [place] MAIN [facility] SERVICES [address] [place], [place], [address] Phone: [phone]   Fax:  [phone]  Name: Meagan Cox MRN: [id number] Date of Birth: 12-Apr-1940

## 2017-05-22 ENCOUNTER — Ambulatory Visit: Payer: PPO | Admitting: Physical Therapy

## 2017-05-22 DIAGNOSIS — R2689 Other abnormalities of gait and mobility: Secondary | ICD-10-CM

## 2017-05-22 DIAGNOSIS — M2170 Unequal limb length (acquired), unspecified site: Secondary | ICD-10-CM

## 2017-05-22 DIAGNOSIS — R293 Abnormal posture: Secondary | ICD-10-CM

## 2017-05-22 DIAGNOSIS — M791 Myalgia, unspecified site: Secondary | ICD-10-CM

## 2017-05-22 NOTE — Patient Instructions (Signed)
se

## 2017-05-23 NOTE — Therapy (Addendum)
[place] MAIN [facility] SERVICES [address] [place], [place], [address] Phone: [phone]   Fax:  [phone]  Physical Therapy Treatment  Patient Details  Name: Meagan Cox MRN: [id number] Date of Birth: 12-21-1940 Referring Provider: Derrel Nip   Encounter Date: 05/22/2017  PT End of Session - 06/19/17 1609    Visit Number  2    Number of Visits  12    Date for PT Re-Evaluation  08/07/17    PT Start Time  1402    PT Stop Time  1500    PT Time Calculation (min)  58 min       Past Medical History:  Diagnosis Date  . Hyperlipidemia   . Hypertension   . Hypothyroidism     Past Surgical History:  Procedure Laterality Date  . ABDOMINAL HYSTERECTOMY    . CARDIAC CATHETERIZATION     30 years ago, [place]  . CHOLECYSTECTOMY N/A 08/21/2015   Procedure: LAPAROSCOPIC CHOLECYSTECTOMY;  Surgeon: Jules Husbands, MD;  Location: [facility] ORS;  Service: General;  Laterality: N/A;  . EYE SURGERY     eye lid lift  . PARTIAL HYSTERECTOMY    . TONSILLECTOMY      There were no vitals filed for this visit.  Subjective Assessment - 06/19/17 1608    Subjective  Pt reports she felt real sore in the area she got worked on at last session. The soreness is better than last week. Pt felt the area of pain loosened up a little bit but the radiating pain to back of knee still remains. 7-8/10 level of pain    Patient Stated Goals  "quit hurting so much"         [facility] PT Assessment - 06/19/17 0001      Strength   Overall Strength  -- hip flex R 4/5, L 3-/5       Palpation   Palpation comment  increased tensions/ tenderness d, by greater trochanter, IT band ( decreased post Tx)                   [facility] Adult PT Treatment/Exercise - 06/19/17 0001      Exercises   Exercises  -- see pt instructionsa       Manual Therapy   Manual therapy comments  STM, MWM at [facility] glut med L, PA mob Grade II SIJ and rotational mob and long axis  distraction                PT Long Term Goals - 05/22/17 1512      PT LONG TERM GOAL #1   Title  Pt will increase her gait speed from 0.8 m/s to >1.2 m/s  in order to ambulate in the community     Time  12    Period  Weeks    Status  New      PT LONG TERM GOAL #2   Title  improve 5 x to stand: 14.10 sec with BUE on armrests to < 13 sec without BUE on armrests     Time  8    Period  Weeks    Status  New      PT LONG TERM GOAL #3   Title  Pt will demo no increased mm tensions on L at glut med, IT band in order to minize pain and walk with less risk for falls    Time  4    Period  Weeks  Status  New    Target Date  06/19/17      PT LONG TERM GOAL #4   Title  Pt will demo LEFS score of 36 pts to > 50 pts in order to restore function     Time  8    Period  Weeks    Status  New    Target Date  07/17/17            Plan - 06/19/17 1610    Clinical Impression Statement  Pt showed decreased mm tensions/ tenderness along greater trochanter/ IT band on LLE following Tx today.  Pt tolerated today's Tx without complaints. Pt continues to benefits from skilled PT.    Rehab Potential  Good    PT Frequency  1x / week    PT Treatment/Interventions  ADLs/Self Care Home Management;Aquatic Therapy;Gait training;Traction;Moist Heat;Stair training;Functional mobility training;Therapeutic activities;Therapeutic exercise;Balance training;Neuromuscular re-education;Electrical Stimulation;Cryotherapy;Manual techniques;Patient/family education;Passive range of motion;Dry needling;Energy conservation    Consulted and Agree with Plan of Care  Patient       Patient will benefit from skilled therapeutic intervention in order to improve the following deficits and impairments:  Abnormal gait, Pain, Improper body mechanics, Postural dysfunction, Other (comment), Decreased mobility, Decreased coordination, Decreased activity tolerance, Decreased endurance, Decreased balance, Decreased safety  awareness, Impaired flexibility, Decreased range of motion, Obesity, Decreased strength, Cardiopulmonary status limiting activity, Difficulty walking, Decreased scar mobility  Visit Diagnosis: Other abnormalities of gait and mobility  Myalgia  Leg length difference, acquired  Poor posture     Problem List Patient Active Problem List   Diagnosis Date Noted  . Cystitis 03/05/2017  . Elevated blood pressure reading without diagnosis of hypertension 03/05/2017  . S/P laparoscopic cholecystectomy 03/05/2017  . Ptosis, both eyelids 01/04/2017  . Diarrhea in adult patient 01/04/2017  . Medicare annual wellness visit, subsequent 07/24/2015  . Cerebrovascular small vessel disease 04/24/2015  . Benign paroxysmal positional vertigo 04/24/2015  . Pain in joint, pelvic region and thigh 04/24/2015  . Counseling for travel 01/11/2015  . Shoulder pain, right 11/22/2013  . Statin intolerance 10/08/2012  . Edema 05/14/2012  . Generalized anxiety disorder 01/06/2012  . 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 Hypothyroidism 12/18/2010  . Low back pain radiating to both legs 12/18/2010  . Hip pain, right 12/18/2010  . Obesity 05/09/2010  . CAD (coronary artery disease) 05/09/2010  . Hyperlipidemia 05/09/2010    Jerl Mina ,PT, DPT, E-RYT  06/19/2017, 4:11 PM  [place] MAIN [facility] SERVICES [address] [place], [place], [address] Phone: [phone]   Fax:  [phone]  Name: Meagan Cox MRN: [id number] Date of Birth: 26-Mar-1940

## 2017-05-24 ENCOUNTER — Telehealth: Payer: Self-pay | Admitting: Internal Medicine

## 2017-05-24 NOTE — Telephone Encounter (Signed)
Please advise 

## 2017-05-24 NOTE — Telephone Encounter (Signed)
Copied from [place] [id number]. Topic: Quick Communication - See Telephone Encounter >> May 24, 2017 12:15 PM Ether Griffins B wrote: CRM for notification. See Telephone encounter for: 05/24/17.  [facility] calling requesting Dr. Derrel Nip place an xray order for pt's left hip. She is starting to have similar pain in her left hip as she did in the right with athritis and she is hoping an xray can be done to determine  this for the pt. CB# [phone].

## 2017-05-24 NOTE — Telephone Encounter (Signed)
Attempted to call [facility], and [facility] stated that she was out of the office and only works half days on Friday. They did take a message and stated that they would send it to her.

## 2017-05-30 ENCOUNTER — Encounter: Payer: PPO | Admitting: Physical Therapy

## 2017-05-30 DIAGNOSIS — H02834 Dermatochalasis of left upper eyelid: Secondary | ICD-10-CM | POA: Diagnosis not present

## 2017-05-30 DIAGNOSIS — H02831 Dermatochalasis of right upper eyelid: Secondary | ICD-10-CM | POA: Diagnosis not present

## 2017-06-06 ENCOUNTER — Ambulatory Visit: Payer: PPO | Admitting: Physical Therapy

## 2017-06-13 ENCOUNTER — Ambulatory Visit: Payer: PPO | Attending: Internal Medicine | Admitting: Physical Therapy

## 2017-06-13 DIAGNOSIS — M6281 Muscle weakness (generalized): Secondary | ICD-10-CM | POA: Diagnosis not present

## 2017-06-13 DIAGNOSIS — R293 Abnormal posture: Secondary | ICD-10-CM | POA: Diagnosis not present

## 2017-06-13 DIAGNOSIS — M2170 Unequal limb length (acquired), unspecified site: Secondary | ICD-10-CM | POA: Diagnosis not present

## 2017-06-13 DIAGNOSIS — M25551 Pain in right hip: Secondary | ICD-10-CM | POA: Diagnosis not present

## 2017-06-13 DIAGNOSIS — R2689 Other abnormalities of gait and mobility: Secondary | ICD-10-CM

## 2017-06-13 DIAGNOSIS — M791 Myalgia, unspecified site: Secondary | ICD-10-CM | POA: Diagnosis not present

## 2017-06-13 NOTE — Patient Instructions (Addendum)
Heel raises 10 reps with one hand on door   _________  Complimentary stretches  Ski track stance,  One foot back, hip width apart,   Bend knee , heel lifts  10reps  ________  Seated L one sided clam, four corners of the feet down, knees aligned at hip socket  10 rep x 3     ______

## 2017-06-14 DIAGNOSIS — M9901 Segmental and somatic dysfunction of cervical region: Secondary | ICD-10-CM | POA: Diagnosis not present

## 2017-06-14 DIAGNOSIS — M531 Cervicobrachial syndrome: Secondary | ICD-10-CM | POA: Diagnosis not present

## 2017-06-14 NOTE — Therapy (Addendum)
[place] MAIN [facility] SERVICES [address] [place], [place], [address] Phone: [phone]   Fax:  [phone]  Physical Therapy Treatment  Patient Details  Name: Meagan Cox MRN: [id number] Date of Birth: July 30, 1940 Referring Provider: Derrel Nip   Encounter Date: 06/13/2017  PT End of Session - 06/13/17 1231    Visit Number  3    Number of Visits  12    Date for PT Re-Evaluation  08/07/17    PT Start Time  1225    PT Stop Time  1310    PT Time Calculation (min)  45 min       Past Medical History:  Diagnosis Date  . Hyperlipidemia   . Hypertension   . Hypothyroidism     Past Surgical History:  Procedure Laterality Date  . ABDOMINAL HYSTERECTOMY    . CARDIAC CATHETERIZATION     30 years ago, [place]  . CHOLECYSTECTOMY N/A 08/21/2015   Procedure: LAPAROSCOPIC CHOLECYSTECTOMY;  Surgeon: Jules Husbands, MD;  Location: [facility] ORS;  Service: General;  Laterality: N/A;  . PARTIAL HYSTERECTOMY    . TONSILLECTOMY      There were no vitals filed for this visit.  Subjective Assessment - 06/13/17 1222    Subjective  Pt reports she has been wearing the Even up shoe sole at home and then wears the shoe lift when out of the house.  Pt read about hemp oil and has been using CBD oil capsule which has been helping with her L leg pain. Pt has een doing her strengthening exercises. Pt notices the pain now is in her buttock to the top of knee.  6-7/10.      Patient Stated Goals  "quit hurting so much"         [facility] PT Assessment - 06/13/17 1306      Strength   Overall Strength  -- hip abd L 4-/5, R 5/5, hip ext 4-/5L, 5/5, hip flex 5/5 B       Palpation   Palpation comment  increased tenderness/ wincing pain at [facility] and hypomobility at lower SIJ on L                    [facility] Adult PT Treatment/Exercise - 06/14/17 1656      Neuro Re-ed    Neuro Re-ed Details   see pt instructions for more PF, INV in closed chain exercise  seated L hip abd       Exercises   Exercises  -- see pt instructions      Manual Therapy   Manual therapy comments  long axis distraction on L, STM along ITBAND , FADDIR /ER AP mobs Grade III                 PT Long Term Goals - 05/22/17 1512      PT LONG TERM GOAL #1   Title  Pt will increase her gait speed from 0.8 m/s to >1.2 m/s  in order to ambulate in the community     Time  12    Period  Weeks    Status  New      PT LONG TERM GOAL #2   Title  improve 5 x to stand: 14.10 sec with BUE on armrests to < 13 sec without BUE on armrests     Time  8    Period  Weeks    Status  New      PT  LONG TERM GOAL #3   Title  Pt will demo no increased mm tensions on L at glut med, IT band in order to minize pain and walk with less risk for falls    Time  4    Period  Weeks    Status  New    Target Date  06/19/17      PT LONG TERM GOAL #4   Title  Pt will demo LEFS score of 36 pts to > 50 pts in order to restore function     Time  8    Period  Weeks    Status  New    Target Date  07/17/17            Plan - 06/14/17 1655    Clinical Impression Statement  Pt showed good carry over with pelvic alignment and less limping with compliant to wearing her Even Up shoe. Pt showed increased hip strengthening and now has progressed to lower kinetic chain strengthening at the ankle/feet to increase arches and posterior chain. Pt responded well to Tx today without complaints.  Pt continues to benefit from skilled PT.    Rehab Potential  Good    PT Frequency  1x / week    PT Treatment/Interventions  ADLs/Self Care Home Management;Aquatic Therapy;Gait training;Traction;Moist Heat;Stair training;Functional mobility training;Therapeutic activities;Therapeutic exercise;Balance training;Neuromuscular re-education;Electrical Stimulation;Cryotherapy;Manual techniques;Patient/family education;Passive range of motion;Dry needling;Energy conservation    Consulted and Agree with Plan of Care   Patient       Patient will benefit from skilled therapeutic intervention in order to improve the following deficits and impairments:  Abnormal gait, Pain, Improper body mechanics, Postural dysfunction, Other (comment), Decreased mobility, Decreased coordination, Decreased activity tolerance, Decreased endurance, Decreased balance, Decreased safety awareness, Impaired flexibility, Decreased range of motion, Obesity, Decreased strength, Cardiopulmonary status limiting activity, Difficulty walking, Decreased scar mobility  Visit Diagnosis: Other abnormalities of gait and mobility  Myalgia  Leg length difference, acquired  Poor posture  Pain in right hip  Muscle weakness (generalized)     Problem List Patient Active Problem List   Diagnosis Date Noted  . Cystitis 03/05/2017  . Elevated blood pressure reading without diagnosis of hypertension 03/05/2017  . S/P laparoscopic cholecystectomy 03/05/2017  . Ptosis, both eyelids 01/04/2017  . Diarrhea in adult patient 01/04/2017  . Medicare annual wellness visit, subsequent 07/24/2015  . Cerebrovascular small vessel disease 04/24/2015  . Benign paroxysmal positional vertigo 04/24/2015  . Pain in joint, pelvic region and thigh 04/24/2015  . Counseling for travel 01/11/2015  . Shoulder pain, right 11/22/2013  . Statin intolerance 10/08/2012  . Edema 05/14/2012  . Generalized anxiety disorder 01/06/2012  . 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 Hypothyroidism 12/18/2010  . Low back pain radiating to both legs 12/18/2010  . Hip pain, right 12/18/2010  . Obesity 05/09/2010  . CAD (coronary artery disease) 05/09/2010  . Hyperlipidemia 05/09/2010    Jerl Mina ,PT, DPT, E-RYT  06/14/2017, 4:56 PM  [place] MAIN [facility] SERVICES [address] [place], [place], [address] Phone: [phone]   Fax:  [phone]  Name: Meagan Cox MRN: [id number] Date of Birth: May 17, 1940

## 2017-06-17 ENCOUNTER — Other Ambulatory Visit: Payer: Self-pay | Admitting: Internal Medicine

## 2017-06-19 ENCOUNTER — Other Ambulatory Visit: Payer: Self-pay | Admitting: Internal Medicine

## 2017-06-19 ENCOUNTER — Ambulatory Visit (INDEPENDENT_AMBULATORY_CARE_PROVIDER_SITE_OTHER): Payer: PPO

## 2017-06-19 VITALS — BP 128/70 | HR 67 | Temp 97.6°F | Resp 15 | Ht 64.5 in | Wt 208.4 lb

## 2017-06-19 DIAGNOSIS — Z0000 Encounter for general adult medical examination without abnormal findings: Secondary | ICD-10-CM | POA: Diagnosis not present

## 2017-06-19 DIAGNOSIS — M25552 Pain in left hip: Secondary | ICD-10-CM | POA: Diagnosis not present

## 2017-06-19 DIAGNOSIS — M1612 Unilateral primary osteoarthritis, left hip: Secondary | ICD-10-CM | POA: Diagnosis not present

## 2017-06-19 NOTE — Addendum Note (Signed)
Addended by: Jerl Mina on: 06/19/2017 01:59 PM   Modules accepted: Orders

## 2017-06-19 NOTE — Patient Instructions (Addendum)
  Ms. Crespo , Thank you for taking time to come for your Medicare Wellness Visit. I appreciate your ongoing commitment to your health goals. Please review the following plan we discussed and let me know if I can assist you in the future.   Xray  These are the goals we discussed: Goals    . Increase physical activity      Stay active and maintain PT therapy exercises. Walk a minimum of 30 minutes with daughter around the neighborhood as often as possible, as tolerated.        This is a list of the screening recommended for you and due dates:  Health Maintenance  Topic Date Due  . Tetanus Vaccine  02/02/1961  . Colon Cancer Screening  02/03/1992  . DEXA scan (bone density measurement)  06/20/2018*  . Flu Shot  08/29/2017  . Pneumonia vaccines  Completed  *Topic was postponed. The date shown is not the original due date.

## 2017-06-19 NOTE — Progress Notes (Unsigned)
Dg hip 

## 2017-06-19 NOTE — Progress Notes (Signed)
Subjective:   Meagan Cox is a 77 y.o. female who presents for Medicare Annual (Subsequent) preventive examination.  Review of Systems:  No ROS.  Medicare Wellness Visit. Additional risk factors are reflected in the social history.  Cardiac Risk Factors include: advanced age (>60men, >28 women);hypertension;obesity (BMI >30kg/m2)     Objective:     Vitals: BP 128/70 (BP Location: Right Arm, Patient Position: Sitting, Cuff Size: Normal)   Pulse 67   Temp 97.6 F (36.4 C) (Oral)   Resp 15   Ht 5' 4.5" (1.638 m)   Wt 208 lb 6.4 oz (94.5 kg)   SpO2 99%   BMI 35.22 kg/m   Body mass index is 35.22 kg/m.  Advanced Directives 06/19/2017 05/15/2017 07/13/2016 07/05/2016 01/09/2016 08/21/2015 05/17/2015  Does Patient Have a Medical Advance Directive? Yes Yes Yes No Yes Yes Yes  Type of Paramedic of [place];Living will - - - [place];Living will [place];Living will [place];Living will;Advance instruction for mental health treatment;Mental Health Advance Directive;Out of facility DNR (pink MOST or yellow form)  Does patient want to make changes to medical advance directive? No - Patient declined - - - No - Patient declined No - Patient declined -  Copy of [place] in Chart? No - copy requested - - - No - copy requested No - copy requested No - copy requested    Tobacco Social History   Tobacco Use  Smoking Status Former Smoker  . Years: 20.00  . Types: Cigarettes  . Last attempt to quit: 01/30/1988  . Years since quitting: 29.4  Smokeless Tobacco Never Used     Counseling given: Not Answered   Clinical Intake:  Pre-visit preparation completed: Yes  Pain : No/denies pain     Nutritional Status: BMI > 30  Obese Diabetes: No  How often do you need to have someone help you when you read instructions, pamphlets, or other written materials from your doctor or  pharmacy?: 1 - Never  Interpreter Needed?: No     Past Medical History:  Diagnosis Date  . Hyperlipidemia   . Hypertension   . Hypothyroidism    Past Surgical History:  Procedure Laterality Date  . ABDOMINAL HYSTERECTOMY    . CARDIAC CATHETERIZATION     30 years ago, [place]  . CHOLECYSTECTOMY N/A 08/21/2015   Procedure: LAPAROSCOPIC CHOLECYSTECTOMY;  Surgeon: Jules Husbands, MD;  Location: [facility] ORS;  Service: General;  Laterality: N/A;  . EYE SURGERY     eye lid lift  . PARTIAL HYSTERECTOMY    . TONSILLECTOMY     Family History  Problem Relation Age of Onset  . Heart disease Mother        heart problems  . Hypothyroidism Mother   . Cancer Father        lymphoma   Social History   Socioeconomic History  . Marital status: Divorced    Spouse name: Not on file  . Number of children: Not on file  . Years of education: Not on file  . Highest education level: Not on file  Occupational History  . Not on file  Social Needs  . Financial resource strain: Not hard at all  . Food insecurity:    Worry: Never true    Inability: Never true  . Transportation needs:    Medical: No    Non-medical: No  Tobacco Use  . Smoking status: Former Smoker  Years: 20.00    Types: Cigarettes    Last attempt to quit: 01/30/1988    Years since quitting: 29.4  . Smokeless tobacco: Never Used  Substance and Sexual Activity  . Alcohol use: Yes    Alcohol/week: 2.4 oz    Types: 4 Glasses of wine per week    Comment: social/occasional  . Drug use: No  . Sexual activity: Not Currently  Lifestyle  . Physical activity:    Days per week: Not on file    Minutes per session: Not on file  . Stress: Not at all  Relationships  . Social connections:    Talks on phone: Not on file    Gets together: Not on file    Attends religious service: Not on file    Active member of club or organization: Not on file    Attends meetings of clubs or organizations: Not on file    Relationship status:  Not on file  Other Topics Concern  . Not on file  Social History Narrative  . Not on file    Outpatient Encounter Medications as of 06/19/2017  Medication Sig  . ALPRAZolam (XANAX) 0.5 MG tablet Take 1 tablet (0.5 mg total) by mouth at bedtime as needed.  . diclofenac (CATAFLAM) 50 MG tablet TAKE ONE TABLET BY MOUTH TWICE DAILY  . estradiol (ESTRACE) 2 MG tablet TAKE 1 TABLET(2 MG) BY MOUTH DAILY  . levothyroxine (SYNTHROID, LEVOTHROID) 125 MCG tablet TAKE 1 TABLET BY MOUTH ONCE DAILY  . Multiple Vitamin (MULTIVITAMIN) tablet Take 1 tablet by mouth daily.    . Omega-3 Fatty Acids (OMEGA-3 PO) Take 2 capsules by mouth daily.  . [DISCONTINUED] benzonatate (TESSALON) 100 MG capsule Take 2 capsules (200 mg total) by mouth 3 (three) times daily.  . [DISCONTINUED] levofloxacin (LEVAQUIN) 500 MG tablet Take 1 tablet (500 mg total) by mouth daily.   No facility-administered encounter medications on file as of 06/19/2017.     Activities of Daily Living In your present state of health, do you have any difficulty performing the following activities: 06/19/2017  Hearing? N  Vision? N  Difficulty concentrating or making decisions? N  Walking or climbing stairs? N  Dressing or bathing? N  Doing errands, shopping? N  Preparing Food and eating ? N  Using the Toilet? N  In the past six months, have you accidently leaked urine? N  Do you have problems with loss of bowel control? N  Managing your Medications? N  Managing your Finances? N  Housekeeping or managing your Housekeeping? N  Some recent data might be hidden    Patient Care Team: Crecencio Mc, MD as PCP - General (Internal Medicine)    Assessment:   This is a routine wellness examination for Meagan Cox.  The goal of the wellness visit is to assist the patient how to close the gaps in care and create a preventative care plan for the patient.   The roster of all physicians providing medical care to patient is listed in the Snapshot  section of the chart.  Taking calcium VIT D as appropriate/Osteoporosis risk reviewed.    Safety issues reviewed; Smoke and carbon monoxide detectors in the home. No firearms in the home. Wears seatbelts when driving or riding with others. No violence in the home.  They do not have excessive sun exposure.  Discussed the need for sun protection: hats, long sleeves and the use of sunscreen if there is significant sun exposure.  Patient is alert, normal appearance,  oriented to person/place/and time.  Correctly identified the president of the [place] and recalls of 2/3 words. Performs simple calculations and can read correct time from watch face. Displays appropriate judgement.  No new identified risk were noted.  No failures at ADL's or IADL's.  A  BMI- discussed the importance of a healthy diet, water intake and the benefits of aerobic exercise. Educational material provided.   24 hour diet recall: Regular diet  Dental- UTD.  Eye- Visual acuity not assessed per patient preference.  Dexa Scan declined.   Colonoscopy discussed. Cologuard declined.   Sleep patterns- Sleeps without issues.   TDAP vaccine deferred per patient preference.  Follow up with insurance.  Educational material provided.  Physical therapist recommended xray completed today.  Deferred to PCP for follow up.   Exercise Activities and Dietary recommendations Current Exercise Habits: Home exercise routine, Type of exercise: calisthenics;walking(PT exercises), Time (Minutes): 60, Frequency (Times/Week): 1, Weekly Exercise (Minutes/Week): 60, Intensity: Mild  Goals    . Increase physical activity      Stay active and maintain PT therapy exercises. Walk a minimum of 30 minutes with daughter around the neighborhood as often as possible, as tolerated.        Fall Risk Fall Risk  06/19/2017 01/09/2016 01/07/2015 11/19/2013 01/04/2012  Falls in the past year? No No No Yes No  Risk for fall due to : - - - Impaired  balance/gait -   Depression Screen PHQ 2/9 Scores 06/19/2017 01/09/2016 01/07/2015 11/19/2013  PHQ - 2 Score 0 0 0 0     Cognitive Function MMSE - Mini Mental State Exam 01/07/2015  Orientation to time 5  Orientation to Place 5  Registration 3  Attention/ Calculation 5  Recall 3  Language- name 2 objects 2  Language- repeat 1  Language- follow 3 step command 3  Language- read & follow direction 1  Write a sentence 1  Copy design 1  Total score 30     6CIT Screen 06/19/2017 01/09/2016  What Year? 0 points 0 points  What month? 0 points 0 points  What time? 0 points 0 points  Count back from 20 0 points 0 points  Months in reverse 0 points 0 points  Repeat phrase 0 points 0 points  Total Score 0 0    Immunization History  Administered Date(s) Administered  . Influenza Split 02/09/2011, 12/02/2014  . Influenza, High Dose Seasonal PF 01/09/2016  . Influenza,inj,Quad PF,6+ Mos 10/08/2012, 11/19/2013  . Influenza-Unspecified 11/12/2016  . Pneumococcal Conjugate-13 01/10/2015  . Pneumococcal Polysaccharide-23 11/04/2012  . Zoster 02/13/2013   Screening Tests Health Maintenance  Topic Date Due  . TETANUS/TDAP  02/02/1961  . COLONOSCOPY  02/03/1992  . DEXA SCAN  06/20/2018 (Originally 02/03/2007)  . INFLUENZA VACCINE  08/29/2017  . PNA vac Low Risk Adult  Completed      Plan:    End of life planning; Advance aging; Advanced directives discussed. Copy of current HCPOA/Living Will requested.    I have personally reviewed and noted the following in the patient's chart:   . Medical and social history . Use of alcohol, tobacco or illicit drugs  . Current medications and supplements . Functional ability and status . Nutritional status . Physical activity . Advanced directives . List of other physicians . Hospitalizations, surgeries, and ER visits in previous 12 months . Vitals . Screenings to include cognitive, depression, and falls . Referrals and appointments  In  addition, I have reviewed and discussed with patient certain preventive  protocols, quality metrics, and best practice recommendations. A written personalized care plan for preventive services as well as general preventive health recommendations were provided to patient.     Varney Biles, LPN  4/31/5400

## 2017-06-20 ENCOUNTER — Ambulatory Visit: Payer: PPO | Admitting: Physical Therapy

## 2017-06-20 DIAGNOSIS — R293 Abnormal posture: Secondary | ICD-10-CM

## 2017-06-20 DIAGNOSIS — R2689 Other abnormalities of gait and mobility: Secondary | ICD-10-CM

## 2017-06-20 DIAGNOSIS — M25551 Pain in right hip: Secondary | ICD-10-CM

## 2017-06-20 DIAGNOSIS — M2170 Unequal limb length (acquired), unspecified site: Secondary | ICD-10-CM

## 2017-06-20 DIAGNOSIS — M791 Myalgia, unspecified site: Secondary | ICD-10-CM

## 2017-06-20 NOTE — Therapy (Addendum)
[place] MAIN [facility] SERVICES [address] [place], [place], [address] Phone: [phone]   Fax:  [phone]  Physical Therapy Treatment  Patient Details  Name: Meagan Cox MRN: [id number] Date of Birth: 06-28-1940 Referring Provider: Derrel Nip   Encounter Date: 06/20/2017  PT End of Session - 06/20/17 2106    Visit Number  4    Number of Visits  12    Date for PT Re-Evaluation  08/07/17    PT Start Time  1003    PT Stop Time  1100    PT Time Calculation (min)  57 min       Past Medical History:  Diagnosis Date  . Hyperlipidemia   . Hypertension   . Hypothyroidism     Past Surgical History:  Procedure Laterality Date  . ABDOMINAL HYSTERECTOMY    . CARDIAC CATHETERIZATION     30 years ago, [place]  . CHOLECYSTECTOMY N/A 08/21/2015   Procedure: LAPAROSCOPIC CHOLECYSTECTOMY;  Surgeon: Jules Husbands, MD;  Location: [facility] ORS;  Service: General;  Laterality: N/A;  . EYE SURGERY     eye lid lift  . PARTIAL HYSTERECTOMY    . TONSILLECTOMY      There were no vitals filed for this visit.  Subjective Assessment - 06/20/17 2037    Subjective  Pt reported her buttock pain that radiates to behind her L knee still bothers her.    Patient Stated Goals  "quit hurting so much"         [facility] PT Assessment - 06/20/17 2038      Strength   Overall Strength  -- DF/eversion L w/referred pain at  L,pre Tx,no pain postTx      Palpation   Palpation comment  tightness at the lower glut muscle , vastus medalis, quadriceps femoris  L      Ambulation/Gait   Gait Comments  slight genu valgus, prontation on Stancephase                Pelvic Floor Special Questions - 06/20/17 2109    Pelvic Floor Internal Exam  pt consented verbally without contraindications    Exam Type  Vaginal    Palpation  increased tensions along L obt int, ischial spine ATLA        [facility] Adult PT Treatment/Exercise - 06/20/17 2038      Neuro Re-ed     Neuro Re-ed Details   see pt instruction, gait training      Manual Therapy   Manual therapy comments  long axis distraction, STM along problem areas    Internal Pelvic Floor  STM along ischial spine, obt int L                 PT Long Term Goals - 05/22/17 1512      PT LONG TERM GOAL #1   Title  Pt will increase her gait speed from 0.8 m/s to >1.2 m/s  in order to ambulate in the community     Time  12    Period  Weeks    Status  New      PT LONG TERM GOAL #2   Title  improve 5 x to stand: 14.10 sec with BUE on armrests to < 13 sec without BUE on armrests     Time  8    Period  Weeks    Status  New      PT LONG TERM GOAL #3   Title  Pt will demo no increased mm tensions on L at glut med, IT band in order to minize pain and walk with less risk for falls    Time  4    Period  Weeks    Status  New    Target Date  06/19/17      PT LONG TERM GOAL #4   Title  Pt will demo LEFS score of 36 pts to > 50 pts in order to restore function     Time  8    Period  Weeks    Status  New    Target Date  07/17/17            Plan - 06/20/17 2118    Clinical Impression Statement  Pt's posterior glut pain resolved after manual Tx today with intravaginal and external techniques.  Tightness of L obturator internus/ quadricept femoris mm  were associated with pain with resisted ankle eversion .  FOllowing release of these muscles, pt no longer had pain with resisted ankle eversion. Progressed to  gait training to minimize pronation / genu valgus to minimize overactivity of these muscles. Pt demo'd new technique correctly. Pt continues to benefit from skilled PT. Pt will return after a trip in two weeks.       Rehab Potential  Good    PT Frequency  1x / week    PT Treatment/Interventions  ADLs/Self Care Home Management;Aquatic Therapy;Gait training;Traction;Moist Heat;Stair training;Functional mobility training;Therapeutic activities;Therapeutic exercise;Balance  training;Neuromuscular re-education;Electrical Stimulation;Cryotherapy;Manual techniques;Patient/family education;Passive range of motion;Dry needling;Energy conservation    Consulted and Agree with Plan of Care  Patient       Patient will benefit from skilled therapeutic intervention in order to improve the following deficits and impairments:  Abnormal gait, Pain, Improper body mechanics, Postural dysfunction, Other (comment), Decreased mobility, Decreased coordination, Decreased activity tolerance, Decreased endurance, Decreased balance, Decreased safety awareness, Impaired flexibility, Decreased range of motion, Obesity, Decreased strength, Cardiopulmonary status limiting activity, Difficulty walking, Decreased scar mobility  Visit Diagnosis: Other abnormalities of gait and mobility  Myalgia  Leg length difference, acquired  Poor posture  Pain in right hip     Problem List Patient Active Problem List   Diagnosis Date Noted  . Cystitis 03/05/2017  . Elevated blood pressure reading without diagnosis of hypertension 03/05/2017  . S/P laparoscopic cholecystectomy 03/05/2017  . Ptosis, both eyelids 01/04/2017  . Diarrhea in adult patient 01/04/2017  . Medicare annual wellness visit, subsequent 07/24/2015  . Cerebrovascular small vessel disease 04/24/2015  . Benign paroxysmal positional vertigo 04/24/2015  . Pain in joint, pelvic region and thigh 04/24/2015  . Counseling for travel 01/11/2015  . Shoulder pain, right 11/22/2013  . Statin intolerance 10/08/2012  . Edema 05/14/2012  . Generalized anxiety disorder 01/06/2012  . 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 Hypothyroidism 12/18/2010  . Low back pain radiating to both legs 12/18/2010  . Hip pain, right 12/18/2010  . Obesity 05/09/2010  . CAD (coronary artery disease) 05/09/2010  . Hyperlipidemia 05/09/2010    Jerl Mina ,PT, DPT, E-RYT  06/20/2017, 9:23 PM  [place] MAIN  [facility] SERVICES [address] [place], [place], [address] Phone: [phone]   Fax:  [phone]  Name: Meagan Cox MRN: [id number] Date of Birth: 03-14-1940

## 2017-06-27 ENCOUNTER — Encounter: Payer: PPO | Admitting: Physical Therapy

## 2017-07-04 ENCOUNTER — Encounter: Payer: PPO | Admitting: Physical Therapy

## 2017-07-11 ENCOUNTER — Encounter: Payer: PPO | Admitting: Physical Therapy

## 2017-07-16 ENCOUNTER — Ambulatory Visit: Payer: PPO | Attending: Internal Medicine | Admitting: Physical Therapy

## 2017-07-16 DIAGNOSIS — M791 Myalgia, unspecified site: Secondary | ICD-10-CM | POA: Insufficient documentation

## 2017-07-16 DIAGNOSIS — M2170 Unequal limb length (acquired), unspecified site: Secondary | ICD-10-CM | POA: Diagnosis not present

## 2017-07-16 DIAGNOSIS — M25551 Pain in right hip: Secondary | ICD-10-CM | POA: Diagnosis not present

## 2017-07-16 DIAGNOSIS — M6281 Muscle weakness (generalized): Secondary | ICD-10-CM | POA: Insufficient documentation

## 2017-07-16 DIAGNOSIS — R293 Abnormal posture: Secondary | ICD-10-CM | POA: Diagnosis not present

## 2017-07-16 DIAGNOSIS — R2689 Other abnormalities of gait and mobility: Secondary | ICD-10-CM | POA: Insufficient documentation

## 2017-07-16 NOTE — Therapy (Signed)
[place] MAIN [facility] SERVICES [address] [place], [place], [address] Phone: [phone]   Fax:  [phone]  Physical Therapy Treatment  Patient Details  Name: Meagan Cox MRN: [id number] Date of Birth: 1940/12/05 Referring Provider: Derrel Nip   Encounter Date: 07/16/2017  PT End of Session - 07/16/17 1109    Visit Number  5    Number of Visits  12    Date for PT Re-Evaluation  08/07/17    PT Start Time  4166    PT Stop Time  1110    PT Time Calculation (min)  55 min       Past Medical History:  Diagnosis Date  . Hyperlipidemia   . Hypertension   . Hypothyroidism     Past Surgical History:  Procedure Laterality Date  . ABDOMINAL HYSTERECTOMY    . CARDIAC CATHETERIZATION     30 years ago, [place]  . CHOLECYSTECTOMY N/A 08/21/2015   Procedure: LAPAROSCOPIC CHOLECYSTECTOMY;  Surgeon: Jules Husbands, MD;  Location: [facility] ORS;  Service: General;  Laterality: N/A;  . EYE SURGERY     eye lid lift  . PARTIAL HYSTERECTOMY    . TONSILLECTOMY      There were no vitals filed for this visit.  Subjective Assessment - 07/16/17 1020    Subjective  Pt reported she had sciatic pain down her L leg 10/10 which was triggered while traveling on the plane to and from her [place] trip. She was able to get her sciatic pain worked out by a Geophysicist/field seismologist on  her cruise trip. She had no problem with walking on her tours. The pain eased up for 2 weeks 2/10 but started again when riding the plan back from her trip.  Pt reported she has been on the go constantly lately and the pain is back at 10/10 . This pain interrupts her sleep.  Both knees started hurting since she got home.       Patient Stated Goals  "quit hurting so much"         [facility] PT Assessment - 07/16/17 1031      Observation/Other Assessments   Observations  DTR reflexes patella:  R 2+, L 1+. no pain with heel and toe walking.        Strength   Overall Strength  -- L hip  /knee flexi, hip/knee ext/ankle ever 4-/5, R 5/5.       Palpation   SI assessment   L SIJ tightness    Palpation comment  L hamstring tightness at ischial tuberosity and bicep femoris/semimenbranosus                     [facility] Adult PT Treatment/Exercise - 07/16/17 1106      Exercises   Exercises  -- see pt instructionsa       Modalities   Modalities  -- moist heat, L hamstring       Manual Therapy   Manual therapy comments  STM along hamstring at ischial tuberosity and bicep femoris/semimenbranosus               PT Education - 07/16/17 1107    Education Details  HEP    Person(s) Educated  Patient    Methods  Explanation;Demonstration;Tactile cues;Verbal cues;Handout    Comprehension  Verbalized understanding;Returned demonstration          PT Long Term Goals - 05/22/17 1512      PT LONG TERM GOAL #1  Title  Pt will increase her gait speed from 0.8 m/s to >1.2 m/s  in order to ambulate in the community     Time  12    Period  Weeks    Status  New      PT LONG TERM GOAL #2   Title  improve 5 x to stand: 14.10 sec with BUE on armrests to < 13 sec without BUE on armrests     Time  8    Period  Weeks    Status  New      PT LONG TERM GOAL #3   Title  Pt will demo no increased mm tensions on L at glut med, IT band in order to minize pain and walk with less risk for falls    Time  4    Period  Weeks    Status  New    Target Date  06/19/17      PT LONG TERM GOAL #4   Title  Pt will demo LEFS score of 36 pts to > 50 pts in order to restore function     Time  8    Period  Weeks    Status  New    Target Date  07/17/17            Plan - 07/16/17 1110    Clinical Impression Statement  Pt showed increased L hamstring tightness which was decreased with manual Tx. Pt showed increased strength in L leg post Tx. Pt reported pain decreased 10/10 pain and now feels only soreness with one spot of pain in the top of her buttocks post Tx. Pt continues to  benefit from skilled PT.     Rehab Potential  Good    PT Frequency  1x / week    PT Treatment/Interventions  ADLs/Self Care Home Management;Aquatic Therapy;Gait training;Traction;Moist Heat;Stair training;Functional mobility training;Therapeutic activities;Therapeutic exercise;Balance training;Neuromuscular re-education;Electrical Stimulation;Cryotherapy;Manual techniques;Patient/family education;Passive range of motion;Dry needling;Energy conservation    Consulted and Agree with Plan of Care  Patient       Patient will benefit from skilled therapeutic intervention in order to improve the following deficits and impairments:  Abnormal gait, Pain, Improper body mechanics, Postural dysfunction, Other (comment), Decreased mobility, Decreased coordination, Decreased activity tolerance, Decreased endurance, Decreased balance, Decreased safety awareness, Impaired flexibility, Decreased range of motion, Obesity, Decreased strength, Cardiopulmonary status limiting activity, Difficulty walking, Decreased scar mobility  Visit Diagnosis: Other abnormalities of gait and mobility  Myalgia  Leg length difference, acquired  Pain in right hip  Poor posture     Problem List Patient Active Problem List   Diagnosis Date Noted  . Cystitis 03/05/2017  . Elevated blood pressure reading without diagnosis of hypertension 03/05/2017  . S/P laparoscopic cholecystectomy 03/05/2017  . Ptosis, both eyelids 01/04/2017  . Diarrhea in adult patient 01/04/2017  . Medicare annual wellness visit, subsequent 07/24/2015  . Cerebrovascular small vessel disease 04/24/2015  . Benign paroxysmal positional vertigo 04/24/2015  . Pain in joint, pelvic region and thigh 04/24/2015  . Counseling for travel 01/11/2015  . Shoulder pain, right 11/22/2013  . Statin intolerance 10/08/2012  . Edema 05/14/2012  . Generalized anxiety disorder 01/06/2012  . 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 Hypothyroidism 12/18/2010  . Low  back pain radiating to both legs 12/18/2010  . Hip pain, right 12/18/2010  . Obesity 05/09/2010  . CAD (coronary artery disease) 05/09/2010  . Hyperlipidemia 05/09/2010    Jerl Mina ,PT, DPT, E-RYT  07/16/2017, 11:12 AM  [facility]  [place] MAIN [facility] SERVICES [address] [place], [place], [address] Phone: [phone]   Fax:  [phone]  Name: Meagan Cox MRN: [id number] Date of Birth: 09-13-1940

## 2017-07-16 NOTE — Patient Instructions (Signed)
Hamstring stretch with strap  Knee out and knee straight

## 2017-07-24 ENCOUNTER — Ambulatory Visit: Payer: PPO | Admitting: Physical Therapy

## 2017-07-24 DIAGNOSIS — R2689 Other abnormalities of gait and mobility: Secondary | ICD-10-CM

## 2017-07-24 DIAGNOSIS — M25551 Pain in right hip: Secondary | ICD-10-CM

## 2017-07-24 DIAGNOSIS — M791 Myalgia, unspecified site: Secondary | ICD-10-CM

## 2017-07-24 DIAGNOSIS — R293 Abnormal posture: Secondary | ICD-10-CM

## 2017-07-24 DIAGNOSIS — M6281 Muscle weakness (generalized): Secondary | ICD-10-CM

## 2017-07-24 DIAGNOSIS — M2170 Unequal limb length (acquired), unspecified site: Secondary | ICD-10-CM

## 2017-07-24 NOTE — Patient Instructions (Addendum)
Strap stretches:  Hamstring stretch with knee bends Rolling leg 30 deg side to side   _____  Hip openers with the green band the thigh  30 reps    _____  Recommended  Bicycling for strengthening  [employer] Portable Arm and Leg Exercise Pedal Exerciser, Silver Vein, Ships Knocked-Down

## 2017-07-24 NOTE — Therapy (Signed)
[place] MAIN [facility] SERVICES [address] [place], [place], [address] Phone: [phone]   Fax:  [phone]  Physical Therapy Treatment  Patient Details  Name: Meagan Cox MRN: [id number] Date of Birth: 02/03/1940 Referring Provider: Derrel Nip   Encounter Date: 07/24/2017  PT End of Session - 07/24/17 1448    Visit Number  6    Number of Visits  12    Date for PT Re-Evaluation  08/07/17    PT Start Time  5852    PT Stop Time  7782    PT Time Calculation (min)  40 min       Past Medical History:  Diagnosis Date  . Hyperlipidemia   . Hypertension   . Hypothyroidism     Past Surgical History:  Procedure Laterality Date  . ABDOMINAL HYSTERECTOMY    . CARDIAC CATHETERIZATION     30 years ago, [place]  . CHOLECYSTECTOMY N/A 08/21/2015   Procedure: LAPAROSCOPIC CHOLECYSTECTOMY;  Surgeon: Jules Husbands, MD;  Location: [facility] ORS;  Service: General;  Laterality: N/A;  . EYE SURGERY     eye lid lift  . PARTIAL HYSTERECTOMY    . TONSILLECTOMY      There were no vitals filed for this visit.  Subjective Assessment - 07/24/17 1411    Subjective  Pt reported feeling good after leaving from PT last session. One of the stretches has caused a catch in her L hip. Pt noticed her L knee hurts on the outside and she noticed how unstable she was when she climbed ladder and it hurts with stairs.     Patient Stated Goals  "quit hurting so much"         [facility] PT Assessment - 07/24/17 1418      Strength   Overall Strength  -- L hip, hip/knee ext/ankle ever 5/5, R 5/5.  B knee flex 4-/5      Palpation   SI assessment   minor tightness L SIJ     Palpation comment  minor tightenss at L ischial tuberosity          PT Long Term Goals - 07/24/17 1457      PT LONG TERM GOAL #1   Title  Pt will increase her gait speed from 0.8 m/s to >1.2 m/s  in order to ambulate in the  community ( 1.01 m/ sec)    Time  12    Period  Weeks    Status   Partially Met      PT LONG TERM GOAL #2   Title  improve 5 x to stand: 14.10 sec with BUE on armrests to < 13 sec without BUE on armrests ( 6/26: 10.2 sec with armrests)     Time  8    Period  Weeks    Status  Achieved      PT LONG TERM GOAL #3   Title  Pt will demo no increased mm tensions on L at glut med, IT band in order to minize pain and walk with less risk for falls    Time  4    Period  Weeks    Status  Achieved      PT LONG TERM GOAL #4   Title  Pt will demo LEFS score of 36 pts to > 50 pts in order to restore function     Time  8    Period  Weeks    Status  On-going  [facility] Adult PT Treatment/Exercise - 07/24/17 1441      Exercises   Exercises  -- see pt instructionsa       Manual Therapy   Manual therapy comments  Long axis distraction on LLE, rotational mob              PT Education - 07/24/17 1448    Education Details  HEP    Person(s) Educated  Patient    Methods  Explanation;Demonstration;Tactile cues;Verbal cues;Handout    Comprehension  Verbalized understanding;Returned demonstration               Plan - 07/24/17 1449    Clinical Impression Statement Pt has achieved 2/4 goals with less L glut mm tightness and resolved pelvic obliquties as pt remains complaint with wearing Even Up sole on her L shoe. Pt's sit to stand time has improved and her gait speed is improving as well. Today, pt showed significantly less tightness along L hamstring compared to previous session. Pt had minor hypomobility at L SIJ. Pt's primary concern has been weakness in her L knee which occurred with ladder and stair climbing. DPT phoned her orthopedist and requested for pt to get an appt to get evaluated for her L knee/hip. Pt has an appt scheduled tomorrow at 10am. Pt was advised to not climb ladders. Pt continues to benefit from progression towards remaining goals. Plan to communicate with MD re: L knee.    Rehab Potential  Good    PT  Frequency  1x / week    PT Treatment/Interventions  ADLs/Self Care Home Management;Aquatic Therapy;Gait training;Traction;Moist Heat;Stair training;Functional mobility training;Therapeutic activities;Therapeutic exercise;Balance training;Neuromuscular re-education;Electrical Stimulation;Cryotherapy;Manual techniques;Patient/family education;Passive range of motion;Dry needling;Energy conservation    Consulted and Agree with Plan of Care  Patient       Patient will benefit from skilled therapeutic intervention in order to improve the following deficits and impairments:  Abnormal gait, Pain, Improper body mechanics, Postural dysfunction, Other (comment), Decreased mobility, Decreased coordination, Decreased activity tolerance, Decreased endurance, Decreased balance, Decreased safety awareness, Impaired flexibility, Decreased range of motion, Obesity, Decreased strength, Cardiopulmonary status limiting activity, Difficulty walking, Decreased scar mobility  Visit Diagnosis: Other abnormalities of gait and mobility  Myalgia  Leg length difference, acquired  Pain in right hip  Poor posture  Muscle weakness (generalized)     Problem List Patient Active Problem List   Diagnosis Date Noted  . Cystitis 03/05/2017  . Elevated blood pressure reading without diagnosis of hypertension 03/05/2017  . S/P laparoscopic cholecystectomy 03/05/2017  . Ptosis, both eyelids 01/04/2017  . Diarrhea in adult patient 01/04/2017  . Medicare annual wellness visit, subsequent 07/24/2015  . Cerebrovascular small vessel disease 04/24/2015  . Benign paroxysmal positional vertigo 04/24/2015  . Pain in joint, pelvic region and thigh 04/24/2015  . Counseling for travel 01/11/2015  . Shoulder pain, right 11/22/2013  . Statin intolerance 10/08/2012  . Edema 05/14/2012  . Generalized anxiety disorder 01/06/2012  . 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 Hypothyroidism 12/18/2010  . Low back pain radiating to both  legs 12/18/2010  . Hip pain, right 12/18/2010  . Obesity 05/09/2010  . CAD (coronary artery disease) 05/09/2010  . Hyperlipidemia 05/09/2010    Yeung,Shin Yiing ,PT, DPT, E-RYT  07/24/2017, 2:56 PM  [place]  [facility] [address] [place], [place], [address] Phone: [phone]   Fax:  [phone]  Name: Meagan Cox MRN: [id number] Date of Birth: 08/15/1940   

## 2017-07-25 DIAGNOSIS — M1612 Unilateral primary osteoarthritis, left hip: Secondary | ICD-10-CM | POA: Diagnosis not present

## 2017-07-25 DIAGNOSIS — M7062 Trochanteric bursitis, left hip: Secondary | ICD-10-CM | POA: Diagnosis not present

## 2017-07-30 ENCOUNTER — Ambulatory Visit: Payer: PPO | Attending: Internal Medicine | Admitting: Physical Therapy

## 2017-07-30 DIAGNOSIS — M791 Myalgia, unspecified site: Secondary | ICD-10-CM | POA: Insufficient documentation

## 2017-07-30 DIAGNOSIS — M6281 Muscle weakness (generalized): Secondary | ICD-10-CM | POA: Insufficient documentation

## 2017-07-30 DIAGNOSIS — R2689 Other abnormalities of gait and mobility: Secondary | ICD-10-CM | POA: Diagnosis not present

## 2017-07-30 DIAGNOSIS — M2170 Unequal limb length (acquired), unspecified site: Secondary | ICD-10-CM | POA: Diagnosis not present

## 2017-07-30 NOTE — Therapy (Signed)
[place] MAIN [facility] SERVICES [address] [place], [place], [address] Phone: [phone]   Fax:  [phone]  Physical Therapy Treatment  Patient Details  Name: Meagan Cox MRN: [id number] Date of Birth: Mar 28, 1940 Referring Provider: Derrel Nip   Encounter Date: 07/30/2017  PT End of Session - 07/30/17 1317    Visit Number  7    Number of Visits  12    Date for PT Re-Evaluation  08/07/17    PT Start Time  1423    PT Stop Time  1105    PT Time Calculation (min)  50 min       Past Medical History:  Diagnosis Date  . Hyperlipidemia   . Hypertension   . Hypothyroidism     Past Surgical History:  Procedure Laterality Date  . ABDOMINAL HYSTERECTOMY    . CARDIAC CATHETERIZATION     30 years ago, [place]  . CHOLECYSTECTOMY N/A 08/21/2015   Procedure: LAPAROSCOPIC CHOLECYSTECTOMY;  Surgeon: Jules Husbands, MD;  Location: [facility] ORS;  Service: General;  Laterality: N/A;  . EYE SURGERY     eye lid lift  . PARTIAL HYSTERECTOMY    . TONSILLECTOMY      There were no vitals filed for this visit.  Subjective Assessment - 07/30/17 1014    Subjective  Pt reported she saw her orthopedist who took xrays wich showed arthritis in the L hip but L knee is fine with a little bit of arthritis. He told her to not wear the Even up shoe. Pt received a shot in her L hip for bursitis which helped stop the pain. Pt has been able to roll onto her L side without being woken up at night by the pain which is the first time the since the surgery.  Pt's MD has provided pt exercises for hip strengthening in standing position and resistance band . Pt dislikes working with band exercises.     Patient Stated Goals  "quit hurting so much"         [facility] PT Assessment - 07/30/17 1319      Other:   Other/ Comments  hooklying hip abd/ER/ flex with groin pain and limitation.  seated perpendicularly on plinth, foot on the ground, no pain .  provided chair to lean onto       Strength   Overall Strength  -- hip flex, ext, knee ext 4+/5 B, knee ext and hip abd 4-/5 B      Ambulation/Gait   Gait Comments  walking with limp,  no Even-Up lift donned per MD order                   [facility] Adult PT Treatment/Exercise - 07/30/17 1316      Neuro Re-ed    Neuro Re-ed Details   see pt instructions      Exercises   Exercises  -- see pt instructions             PT Education - 07/30/17 1317    Education Details  HEP    Person(s) Educated  Patient    Methods  Explanation;Demonstration;Tactile cues;Verbal cues;Handout    Comprehension  Returned demonstration;Verbalized understanding          PT Long Term Goals - 07/30/17 1326      PT LONG TERM GOAL #1   Title  Pt will increase her gait speed from 0.8 m/s to >1.2 m/s  in order to ambulate in the  community ( 1.01 m/ sec)    Time  12    Period  Weeks    Status  Partially Met      PT LONG TERM GOAL #2   Title  improve 5 x to stand: 14.10 sec with BUE on armrests to < 13 sec without BUE on armrests ( 6/26: 10.2 sec with armrests)     Time  8    Period  Weeks    Status  Achieved      PT LONG TERM GOAL #3   Title  Pt will demo no increased mm tensions on L at glut med, IT band in order to minize pain and walk with less risk for falls    Time  4    Period  Weeks    Status  Achieved      PT LONG TERM GOAL #4   Title  Pt will demo LEFS score of 36 pts to > 50 pts in order to restore function     Time  8    Period  Weeks    Status  On-going            Plan - 07/30/17 1318    Clinical Impression Statement  Pt consulted her orthopedist about her concern on her L knee and hip. DPT plans to contact [facility] for her imaging. 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  Pt received  a shot for bursitis which resolved the pain and has enabled her to sleep on her side. Pt was been advised to stop wearing her Even up shoe lift.  Assisted pt to perform the hip strengthening exercises prescribed by her MD with BUE support at her bar counter and placement of her bands for easy access. Pt performed 10 reps with report of fatigue and found BUE to be helpful. Complimentary stretches to minimize tightness of gluts and hamstrings were added to HEP.  Propioception training was added to minimize genu valgus. Anticipate addressing hip strengthening and genu valgus and flexibility of glut/ hamstrings will yield greater outcomes. Modified piriformis stretch to seated perpendicularly on her couch/ recliner because of the pain she has in her groin 2/2 OA when in hooklying/regular seated position. Pt will return in 2 weeks. Pt continues to benefit from skilled PT.    Rehab Potential  Good    PT Frequency  1x / week    PT Treatment/Interventions  ADLs/Self Care Home Management;Aquatic Therapy;Gait training;Traction;Moist Heat;Stair training;Functional mobility training;Therapeutic activities;Therapeutic exercise;Balance training;Neuromuscular re-education;Electrical Stimulation;Cryotherapy;Manual techniques;Patient/family education;Passive range of motion;Dry needling;Energy conservation    Consulted and Agree with Plan of Care  Patient       Patient will benefit from skilled therapeutic intervention in order to improve the following deficits and impairments:  Abnormal gait, Pain, Improper body mechanics, Postural dysfunction, Other (comment), Decreased mobility, Decreased coordination, Decreased activity tolerance, Decreased endurance, Decreased balance, Decreased safety awareness, Impaired flexibility, Decreased range of motion, Obesity, Decreased strength, Cardiopulmonary status limiting activity, Difficulty walking, Decreased scar mobility  Visit Diagnosis: Other abnormalities of gait and mobility  Myalgia  Leg length difference, acquired     Problem List Patient Active Problem List   Diagnosis Date Noted  . Cystitis 03/05/2017   . Elevated blood pressure reading without diagnosis of hypertension 03/05/2017  . S/P laparoscopic cholecystectomy 03/05/2017  . Ptosis, both eyelids 01/04/2017  . Diarrhea in adult patient 01/04/2017  . Medicare annual wellness visit, subsequent  07/24/2015  . Cerebrovascular small vessel disease 04/24/2015  . Benign paroxysmal positional vertigo 04/24/2015  . Pain in joint, pelvic region and thigh 04/24/2015  . Counseling for travel 01/11/2015  . Shoulder pain, right 11/22/2013  . Statin intolerance 10/08/2012  . Edema 05/14/2012  . Generalized anxiety disorder 01/06/2012  . 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 Hypothyroidism 12/18/2010  . Low back pain radiating to both legs 12/18/2010  . Hip pain, right 12/18/2010  . Obesity 05/09/2010  . CAD (coronary artery disease) 05/09/2010  . Hyperlipidemia 05/09/2010    Jerl Mina ,PT, DPT, E-RYT  07/30/2017, 1:35 PM  [place] MAIN [facility] SERVICES [address] [place], [place], [address] Phone: [phone]   Fax:  [phone]  Name: Meagan Cox MRN: [id number] Date of Birth: 1940/12/02

## 2017-07-30 NOTE — Patient Instructions (Signed)
Dr. Alvira Philips exercises at the bar  10 reps each x 3 x day  Green band : MAKE SURE your supporting leg has soft knees, pointed out towards 3rd toe, more weight on the pinky toe side of the foot 1)  Kick Back  :  2) sideways  3) lifting thigh up  Add: 4) mini squat ( knees behinds toes, knees pointed out)    Complimentary stretches:  On recliner, sideways in the seat, prop bent knee on seat, outside thigh point down, toes on the floor tucked behind Outer arm on back of another chair  ( Modified figure-4)     Hamstring stretch on your back with belt

## 2017-08-13 ENCOUNTER — Ambulatory Visit: Payer: PPO | Admitting: Physical Therapy

## 2017-08-13 DIAGNOSIS — R2689 Other abnormalities of gait and mobility: Secondary | ICD-10-CM

## 2017-08-13 DIAGNOSIS — M2170 Unequal limb length (acquired), unspecified site: Secondary | ICD-10-CM

## 2017-08-13 DIAGNOSIS — M791 Myalgia, unspecified site: Secondary | ICD-10-CM

## 2017-08-13 NOTE — Addendum Note (Signed)
Addended by: Jerl Mina on: 08/13/2017 11:51 AM   Modules accepted: Orders

## 2017-08-13 NOTE — Patient Instructions (Signed)
Step up on R, then L and down with R and then L with both hands in doorway  Place feet hip width apart Ok to turn R toe out slight to make sure knee tracks in 2-3rd toes alignment   30 reps x 2 day   ----  Walking with more higher thighs to pick up feet and not drag

## 2017-08-13 NOTE — Therapy (Addendum)
[place] MAIN [facility] SERVICES [address] [place], [place], [address] Phone: [phone]   Fax:  [phone]  Physical Therapy Treatment / Progress Note  Patient Details  Name: Meagan Cox MRN: [id number] Date of Birth: Jun 18, 1940 Referring Provider: Derrel Nip   Encounter Date: 08/13/2017  PT End of Session - 08/13/17 1128    Visit Number  8    Number of Visits  12    Date for PT Re-Evaluation  08/07/17    PT Start Time  1010    PT Stop Time  1046    PT Time Calculation (min)  36 min       Past Medical History:  Diagnosis Date  . Hyperlipidemia   . Hypertension   . Hypothyroidism     Past Surgical History:  Procedure Laterality Date  . ABDOMINAL HYSTERECTOMY    . CARDIAC CATHETERIZATION     30 years ago, [place]  . CHOLECYSTECTOMY N/A 08/21/2015   Procedure: LAPAROSCOPIC CHOLECYSTECTOMY;  Surgeon: Jules Husbands, MD;  Location: [facility] ORS;  Service: General;  Laterality: N/A;  . EYE SURGERY     eye lid lift  . PARTIAL HYSTERECTOMY    . TONSILLECTOMY      There were no vitals filed for this visit.  Subjective Assessment - 08/13/17 1013    Subjective  Pt has been practicing her hip exercises as prescribed by her surgeon but barefoot. Pt did not have her glut anterior thigh pain during the past 2 weeks. This morning, Pt tried to use the 1/2 inch shoe lift and noticed the return of her L glut/ afront thigh pain. Upon removing it, she had no pain again.     Patient Stated Goals  "quit hurting so much"         [facility] PT Assessment - 08/13/17 1021      Strength   Overall Strength  -- hip ext/flex 4+/5 B, knee flexion R 4+/5, 4-/5 L,     Overall Strength Comments  hip ext / knee flex/ hip abd  4-/5, L 4+/5       Ambulation/Gait   Stair Management Technique  -- narrow BOS, genu valgus on R > L on descent     Gait Comments  no shoe lift ( decreased L stance and push up, R foot drag, decreased R foot clearance due to shorter  L leg ( with new shoe lift which is thinner than previous shoe lift: pt showed less deviations)                    [facility] Adult PT Treatment/Exercise - 08/13/17 1021      Therapeutic Activites    Therapeutic Activities  -- stair training, step up/ down with cues       Neuro Re-ed    Neuro Re-ed Details   see pt instructions                  PT Long Term Goals - 08/13/17 1137      PT LONG TERM GOAL #1   Title  Pt will increase her gait speed from 0.8 m/s to >1.2 m/s  in order to ambulate in the  community ( 1.01 m/ sec)    Time  12    Period  Weeks    Status  Partially Met      PT LONG TERM GOAL #2   Title  improve 5 x to stand: 14.10 sec with BUE on  armrests to < 13 sec without BUE on armrests ( 6/26: 10.2 sec with armrests)     Time  8    Period  Weeks    Status  Achieved      PT LONG TERM GOAL #3   Title  Pt will demo no increased mm tensions on L at glut med, IT band in order to minize pain and walk with less risk for falls    Time  4    Period  Weeks    Status  Achieved      PT LONG TERM GOAL #4   Title  Pt will demo LEFS score of 36 pts to > 50 pts in order to restore function     Time  8    Period  Weeks    Status  On-going      PT LONG TERM GOAL #5   Title  Pt will demo no genu valgus on R knee with stair navgiation in order to minimize injuries     Time  12    Period  Weeks    Status  New    Target Date  11/05/17            Plan - 08/13/17 1129    Clinical Impression Statement Pt has achieved 2/5 goals. Pt's sit to  stand time has improved indicating increased functional strength. Pt's  tensions in L LE has decreased. Today, pt showed increased BLE strength but decreased strength on L posterior mm strength in hamstrings and gluts. Pt has had no glut / hip pain when no wearing her shoe lift for the past 2 weeks , however, pt felt an uneven pattern with walking. Pt demo'd decreased L stance phase, R foot drag, decreased R foot  clearance due to shorter L leg length. Pt tried wearing her shoe lift for the first time after 2 weeks and noticed her glut and hip pain returned. However, pt 's original shoe lift appeared too high and after being given a shorter shoe lift, pt reported no return of pain and better gait. Pt showed less deviations in gait with new shorter lift. Suspect pt's L BLE weakness is due to pt practicing her strengthening exercises barefoot with the shorter leg length difference. Educated pt to perform her CKC exercises with shoe lift in place.  Initiated stair navigation to promote stronger lower kinetic strength. Pt demo'd genu valgus with R knee more than L and will benefit from further skilled PT to minimize this deviation and minimize risk for injuries.        Rehab Potential  Good    PT Frequency  1x / week    PT Treatment/Interventions  ADLs/Self Care Home Management;Aquatic Therapy;Gait training;Traction;Moist Heat;Stair training;Functional mobility training;Therapeutic activities;Therapeutic exercise;Balance training;Neuromuscular re-education;Electrical Stimulation;Cryotherapy;Manual techniques;Patient/family education;Passive range of motion;Dry needling;Energy conservation    Consulted and Agree with Plan of Care  Patient       Patient will benefit from skilled therapeutic intervention in order to improve the following deficits and impairments:  Abnormal gait, Pain, Improper body mechanics, Postural dysfunction, Other (comment), Decreased mobility, Decreased coordination, Decreased activity tolerance, Decreased endurance, Decreased balance, Decreased safety awareness, Impaired flexibility, Decreased range of motion, Obesity, Decreased strength, Cardiopulmonary status limiting activity, Difficulty walking, Decreased scar mobility  Visit Diagnosis: Leg length difference, acquired  Other abnormalities of gait and mobility  Myalgia     Problem List Patient Active Problem List   Diagnosis Date  Noted  . Cystitis 03/05/2017  . Elevated  blood pressure reading without diagnosis of hypertension 03/05/2017  . S/P laparoscopic cholecystectomy 03/05/2017  . Ptosis, both eyelids 01/04/2017  . Diarrhea in adult patient 01/04/2017  . Medicare annual wellness visit, subsequent 07/24/2015  . Cerebrovascular small vessel disease 04/24/2015  . Benign paroxysmal positional vertigo 04/24/2015  . Pain in joint, pelvic region and thigh 04/24/2015  . Counseling for travel 01/11/2015  . Shoulder pain, right 11/22/2013  . Statin intolerance 10/08/2012  . Edema 05/14/2012  . Generalized anxiety disorder 01/06/2012  . 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 Hypothyroidism 12/18/2010  . Low back pain radiating to both legs 12/18/2010  . Hip pain, right 12/18/2010  . Obesity 05/09/2010  . CAD (coronary artery disease) 05/09/2010  . Hyperlipidemia 05/09/2010    Jerl Mina  ,PT, DPT, E-RYT  08/13/2017, 11:39 AM  [place] MAIN [facility] SERVICES [address] [place], [place], [address] Phone: [phone]   Fax:  [phone]  Name: Cadyn Rodger MRN: [id number] Date of Birth: 1940/11/16

## 2017-09-02 ENCOUNTER — Ambulatory Visit: Payer: PPO | Attending: Internal Medicine | Admitting: Physical Therapy

## 2017-09-02 DIAGNOSIS — M25551 Pain in right hip: Secondary | ICD-10-CM

## 2017-09-02 DIAGNOSIS — R2689 Other abnormalities of gait and mobility: Secondary | ICD-10-CM

## 2017-09-02 DIAGNOSIS — M6281 Muscle weakness (generalized): Secondary | ICD-10-CM

## 2017-09-02 DIAGNOSIS — M791 Myalgia, unspecified site: Secondary | ICD-10-CM

## 2017-09-02 DIAGNOSIS — M2170 Unequal limb length (acquired), unspecified site: Secondary | ICD-10-CM

## 2017-09-02 DIAGNOSIS — R293 Abnormal posture: Secondary | ICD-10-CM | POA: Diagnosis not present

## 2017-09-02 NOTE — Patient Instructions (Signed)
Increase more  reps on L side for raising knee and kicking back than R   Walking with higher thighs to pick up feet   Heel raises 10 reps x 3   Look into Tai chi classes

## 2017-09-02 NOTE — Therapy (Signed)
[place] MAIN [facility] SERVICES [address] [place], [place], [address] Phone: [phone]   Fax:  [phone]  Physical Therapy Treatment  Patient Details  Name: Meagan Cox MRN: [id number] Date of Birth: 04-26-1940 Referring Provider: Derrel Nip   Encounter Date: 09/02/2017  PT End of Session - 09/02/17 1542    Visit Number  9    Number of Visits  12    Date for PT Re-Evaluation  11/05/17    PT Start Time  1007    PT Stop Time  1104    PT Time Calculation (min)  57 min    Activity Tolerance  Patient tolerated treatment well;No increased pain    Behavior During Therapy  WFL for tasks assessed/performed       Past Medical History:  Diagnosis Date  . Hyperlipidemia   . Hypertension   . Hypothyroidism     Past Surgical History:  Procedure Laterality Date  . ABDOMINAL HYSTERECTOMY    . CARDIAC CATHETERIZATION     30 years ago,   . CHOLECYSTECTOMY N/A 08/21/2015   Procedure: LAPAROSCOPIC CHOLECYSTECTOMY;  Surgeon: Jules Husbands, MD;  Location: [facility] ORS;  Service: General;  Laterality: N/A;  . EYE SURGERY     eye lid lift  . PARTIAL HYSTERECTOMY    . TONSILLECTOMY      There were no vitals filed for this visit.  Subjective Assessment - 09/02/17 1043    Subjective  Pt has not had the L thigh pain for the past 3 weeks since she has had the shot. Pt has been doing her HEP. Pt has found the thinner shoe lift to be helpful. Pt has had some balance issues but no falls.  Pt catches herself when she is wobbly and she straightens herself out.     Patient Stated Goals  "quit hurting so much"         [facility] PT Assessment - 09/02/17 1021      Strength   Overall Strength  -- hip ext/flex 5/5 L,4+/5R, kneeflex B5/5  L,hip abd 5/5B    Overall Strength Comments  plantarflexion with single UE on wall Grade 4/5  for 10 reps B       Ambulation/Gait   Gait Comments  demo'd anterior COM with down hill descent on uneven ground without  LOB.  Gait on levelled ground with minimal push off, short strides, decreased hip flexion / swing                    [facility] Adult PT Treatment/Exercise - 09/02/17 1111      Therapeutic Activites    Therapeutic Activities  -- walking up low grade hill, uneven ground      Neuro Re-ed    Neuro Re-ed Details   see pt instructions                  PT Long Term Goals - 09/02/17 1013      PT LONG TERM GOAL #1   Title  Pt will increase her gait speed from 0.8 m/s to >1.2 m/s  in order to ambulate in the  community ( 1.01 m/ sec) (8/5: 1.2 m/s)     Time  12    Period  Weeks    Status  Achieved      PT LONG TERM GOAL #2   Title  improve 5 x to stand: 14.10 sec with BUE on armrests to < 13 sec without BUE  on armrests ( 6/26: 10.2 sec with armrests)     Time  8    Period  Weeks    Status  Achieved      PT LONG TERM GOAL #3   Title  Pt will demo no increased mm tensions on L at glut med, IT band in order to minize pain and walk with less risk for falls    Time  4    Period  Weeks    Status  Achieved      PT LONG TERM GOAL #4   Title  Pt will demo LEFS score of 36 pts to > 50 pts in order to restore function     Time  8    Period  Weeks    Status  On-going      PT LONG TERM GOAL #5   Title  Pt will demo no genu valgus on R knee with stair navgiation in order to minimize injuries     Time  12    Period  Weeks    Status  Partially Met      Additional Long Term Goals   Additional Long Term Goals  Yes      PT LONG TERM GOAL #6   Title  Pt will demo              Plan - 09/02/17 1543    Clinical Impression Statement  Pt showed good carry over from last session as pt reported no lateral thigh pain. Pt's gait has improved with a lower shoe lift insert and pt reported she felt more balanced when walking with it compared to without it. Pt's gait still needed more improvement as it showed short stride, minimal push-off/ swing. Pt demo'd correctly following Tx.  Pt also demo'd less genu valgus with stairs up and down.  Pt was educated on the use of walking poles in preparation for navigating up/down hills or stairs with more stability when she goes on her trip to [place] next year but pt declined using them. Pt practiced using them on a small low grade hill outdoors on grass but pt continued to decline using them.  Pt showed increased hip abduction strength but decreased strength in hip flexion and extension on L > R. Plan to continue to impliment balance and prioception training at upcoming sessions. Pt voiced she preferred to seek out tai chi classes to improve balance and may consider OPPT for balance training later.  Pt continues to benefit from skilled PT.     Rehab Potential  Good    PT Frequency  1x / week    PT Treatment/Interventions  ADLs/Self Care Home Management;Aquatic Therapy;Gait training;Traction;Moist Heat;Stair training;Functional mobility training;Therapeutic activities;Therapeutic exercise;Balance training;Neuromuscular re-education;Electrical Stimulation;Cryotherapy;Manual techniques;Patient/family education;Passive range of motion;Dry needling;Energy conservation    Consulted and Agree with Plan of Care  Patient       Patient will benefit from skilled therapeutic intervention in order to improve the following deficits and impairments:  Abnormal gait, Pain, Improper body mechanics, Postural dysfunction, Other (comment), Decreased mobility, Decreased coordination, Decreased activity tolerance, Decreased endurance, Decreased balance, Decreased safety awareness, Impaired flexibility, Decreased range of motion, Obesity, Decreased strength, Cardiopulmonary status limiting activity, Difficulty walking, Decreased scar mobility  Visit Diagnosis: Other abnormalities of gait and mobility  Myalgia  Leg length difference, acquired  Pain in right hip  Poor posture  Muscle weakness (generalized)     Problem List Patient Active Problem List    Diagnosis Date Noted  . Cystitis  03/05/2017  . Elevated blood pressure reading without diagnosis of hypertension 03/05/2017  . S/P laparoscopic cholecystectomy 03/05/2017  . Ptosis, both eyelids 01/04/2017  . Diarrhea in adult patient 01/04/2017  . Medicare annual wellness visit, subsequent 07/24/2015  . Cerebrovascular small vessel disease 04/24/2015  . Benign paroxysmal positional vertigo 04/24/2015  . Pain in joint, pelvic region and thigh 04/24/2015  . Counseling for travel 01/11/2015  . Shoulder pain, right 11/22/2013  . Statin intolerance 10/08/2012  . Edema 05/14/2012  . Generalized anxiety disorder 01/06/2012  . 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 Hypothyroidism 12/18/2010  . Low back pain radiating to both legs 12/18/2010  . Hip pain, right 12/18/2010  . Obesity 05/09/2010  . CAD (coronary artery disease) 05/09/2010  . Hyperlipidemia 05/09/2010    Jerl Mina ,PT, DPT, E-RYT  09/02/2017, 3:49 PM  [place] MAIN [facility] SERVICES [address] [place], [place], [address] Phone: [phone]   Fax:  [phone]  Name: Meagan Cox MRN: [id number] Date of Birth: 1940-02-15

## 2017-09-10 DIAGNOSIS — Z96641 Presence of right artificial hip joint: Secondary | ICD-10-CM | POA: Diagnosis not present

## 2017-09-16 ENCOUNTER — Other Ambulatory Visit: Payer: Self-pay | Admitting: Internal Medicine

## 2017-09-16 ENCOUNTER — Encounter: Payer: PPO | Admitting: Physical Therapy

## 2017-10-07 ENCOUNTER — Ambulatory Visit: Payer: PPO | Attending: Internal Medicine | Admitting: Physical Therapy

## 2017-10-07 DIAGNOSIS — M6281 Muscle weakness (generalized): Secondary | ICD-10-CM | POA: Insufficient documentation

## 2017-10-07 DIAGNOSIS — R2689 Other abnormalities of gait and mobility: Secondary | ICD-10-CM | POA: Diagnosis not present

## 2017-10-07 DIAGNOSIS — M25551 Pain in right hip: Secondary | ICD-10-CM | POA: Diagnosis not present

## 2017-10-07 DIAGNOSIS — M791 Myalgia, unspecified site: Secondary | ICD-10-CM | POA: Diagnosis not present

## 2017-10-07 DIAGNOSIS — M2170 Unequal limb length (acquired), unspecified site: Secondary | ICD-10-CM | POA: Diagnosis not present

## 2017-10-07 DIAGNOSIS — R293 Abnormal posture: Secondary | ICD-10-CM | POA: Diagnosis not present

## 2017-10-08 NOTE — Therapy (Signed)
[place] MAIN [facility] SERVICES [address] [place], [place], [address] Phone: [phone]   Fax:  [phone]  Physical Therapy Treatment  Patient Details  Name: Meagan Cox MRN: [id number] Date of Birth: 12/19/1940 Referring Provider: Derrel Nip   Encounter Date: 10/07/2017  PT End of Session - 10/07/17 1023    Visit Number  10    Number of Visits  12    Date for PT Re-Evaluation  12/30/17    PT Start Time  1010    PT Stop Time  1033    PT Time Calculation (min)  23 min    Activity Tolerance  Patient tolerated treatment well;No increased pain    Behavior During Therapy  WFL for tasks assessed/performed       Past Medical History:  Diagnosis Date  . Hyperlipidemia   . Hypertension   . Hypothyroidism     Past Surgical History:  Procedure Laterality Date  . ABDOMINAL HYSTERECTOMY    . CARDIAC CATHETERIZATION     30 years ago, [place]  . CHOLECYSTECTOMY N/A 08/21/2015   Procedure: LAPAROSCOPIC CHOLECYSTECTOMY;  Surgeon: Jules Husbands, MD;  Location: [facility] ORS;  Service: General;  Laterality: N/A;  . EYE SURGERY     eye lid lift  . PARTIAL HYSTERECTOMY    . TONSILLECTOMY      There were no vitals filed for this visit.      [facility] PT Assessment - 10/08/17 1327      Ambulation/Gait   Stair Management Technique  --   descend w/ LLE w/ difficulty, with RLE w/ less difficulty   Gait Comments  unilateral UE on rail, body turned 45 deg with less pain and difficulty, Poor eccentric control on descend with knee flexion/ DF                   [facility] Adult PT Treatment/Exercise - 10/08/17 1327      Therapeutic Activites    Therapeutic Activities  --   stair navigation w/ safe strategies                 PT Long Term Goals - 10/07/17 1019      PT LONG TERM GOAL #1   Title  Pt will increase her gait speed from 0.8 m/s to >1.2 m/s  in order to ambulate in the  community ( 1.01 m/ sec) (8/5: 1.2 m/s)      Time  12    Period  Weeks    Status  Achieved      PT LONG TERM GOAL #2   Title  improve 5 x to stand: 14.10 sec with BUE on armrests to < 13 sec without BUE on armrests ( 6/26: 10.2 sec with armrests)     Time  8    Period  Weeks    Status  Achieved      PT LONG TERM GOAL #3   Title  Pt will demo no increased mm tensions on L at glut med, IT band in order to minize pain and walk with less risk for falls    Time  4    Period  Weeks    Status  Achieved      PT LONG TERM GOAL #4   Title  Pt will demo LEFS score of 36 pts to > 50 pts in order to restore function (9/9: 47 pts)     Time  8    Period  Weeks  Status  Partially Met      PT LONG TERM GOAL #5   Title  Pt will demo no genu valgus on R knee with stair navgiation in order to minimize injuries     Time  12    Period  Weeks    Status  Achieved      Additional Long Term Goals   Additional Long Term Goals  Yes      PT LONG TERM GOAL #6   Title  Pt will have no hip pain on BLE after 2 months of being IND with her HEP without PT in order to maintain strength and wellness     Time  12    Period  Weeks    Status  New    Target Date  12/30/17            Plan - 10/08/17 1319    Clinical Impression Statement  Pt has achieved 4/6 goals with signficantly increased LEFS with increased ADLs. Pt's hip pain has improved with proper SIJ mobility, small shoe lift in LLE, significantly increased BLE strength, and  decreased mm tensions. Pt demo'd increasd gait speed and less gait deviations.  Addressed stair navigation strategies today and plan to progress with further. Pt continues to benefit from skilled PT.      Rehab Potential  Good    PT Frequency  1x / week    PT Treatment/Interventions  ADLs/Self Care Home Management;Aquatic Therapy;Gait training;Traction;Moist Heat;Stair training;Functional mobility training;Therapeutic activities;Therapeutic exercise;Balance training;Neuromuscular re-education;Electrical  Stimulation;Cryotherapy;Manual techniques;Patient/family education;Passive range of motion;Dry needling;Energy conservation    Consulted and Agree with Plan of Care  Patient       Patient will benefit from skilled therapeutic intervention in order to improve the following deficits and impairments:  Abnormal gait, Pain, Improper body mechanics, Postural dysfunction, Other (comment), Decreased mobility, Decreased coordination, Decreased activity tolerance, Decreased endurance, Decreased balance, Decreased safety awareness, Impaired flexibility, Decreased range of motion, Obesity, Decreased strength, Cardiopulmonary status limiting activity, Difficulty walking, Decreased scar mobility  Visit Diagnosis: Other abnormalities of gait and mobility  Myalgia  Leg length difference, acquired  Pain in right hip  Poor posture  Muscle weakness (generalized)     Problem List Patient Active Problem List   Diagnosis Date Noted  . Cystitis 03/05/2017  . Elevated blood pressure reading without diagnosis of hypertension 03/05/2017  . S/P laparoscopic cholecystectomy 03/05/2017  . Ptosis, both eyelids 01/04/2017  . Diarrhea in adult patient 01/04/2017  . Medicare annual wellness visit, subsequent 07/24/2015  . Cerebrovascular small vessel disease 04/24/2015  . Benign paroxysmal positional vertigo 04/24/2015  . Pain in joint, pelvic region and thigh 04/24/2015  . Counseling for travel 01/11/2015  . Shoulder pain, right 11/22/2013  . Statin intolerance 10/08/2012  . Edema 05/14/2012  . Generalized anxiety disorder 01/06/2012  . 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 Hypothyroidism 12/18/2010  . Low back pain radiating to both legs 12/18/2010  . Hip pain, right 12/18/2010  . Obesity 05/09/2010  . CAD (coronary artery disease) 05/09/2010  . Hyperlipidemia 05/09/2010    Jerl Mina ,PT, DPT, E-RYT  10/08/2017, 1:31 PM  [place] MAIN [facility]  SERVICES [address] [place], [place], [address] Phone: [phone]   Fax:  [phone]  Name: Meagan Cox MRN: [id number] Date of Birth: 1940-06-30

## 2017-10-17 ENCOUNTER — Ambulatory Visit (INDEPENDENT_AMBULATORY_CARE_PROVIDER_SITE_OTHER): Payer: PPO | Admitting: Internal Medicine

## 2017-10-17 ENCOUNTER — Encounter: Payer: Self-pay | Admitting: Internal Medicine

## 2017-10-17 VITALS — BP 132/80 | HR 66 | Temp 97.7°F | Resp 16 | Ht 64.5 in | Wt 204.2 lb

## 2017-10-17 DIAGNOSIS — E034 Atrophy of thyroid (acquired): Secondary | ICD-10-CM

## 2017-10-17 DIAGNOSIS — R481 Agnosia: Secondary | ICD-10-CM

## 2017-10-17 DIAGNOSIS — IMO0001 Reserved for inherently not codable concepts without codable children: Secondary | ICD-10-CM

## 2017-10-17 DIAGNOSIS — R42 Dizziness and giddiness: Secondary | ICD-10-CM

## 2017-10-17 DIAGNOSIS — Z7189 Other specified counseling: Secondary | ICD-10-CM | POA: Diagnosis not present

## 2017-10-17 DIAGNOSIS — Z7184 Encounter for health counseling related to travel: Secondary | ICD-10-CM

## 2017-10-17 DIAGNOSIS — K121 Other forms of stomatitis: Secondary | ICD-10-CM

## 2017-10-17 DIAGNOSIS — R200 Anesthesia of skin: Secondary | ICD-10-CM | POA: Diagnosis not present

## 2017-10-17 DIAGNOSIS — H8143 Vertigo of central origin, bilateral: Secondary | ICD-10-CM | POA: Diagnosis not present

## 2017-10-17 LAB — CBC WITH DIFFERENTIAL/PLATELET
BASOS ABS: 0.1 10*3/uL (ref 0.0–0.1)
Basophils Relative: 0.7 % (ref 0.0–3.0)
Eosinophils Absolute: 0.1 10*3/uL (ref 0.0–0.7)
Eosinophils Relative: 1.3 % (ref 0.0–5.0)
HCT: 44.4 % (ref 36.0–46.0)
Hemoglobin: 14.7 g/dL (ref 12.0–15.0)
LYMPHS ABS: 2.2 10*3/uL (ref 0.7–4.0)
Lymphocytes Relative: 22.9 % (ref 12.0–46.0)
MCHC: 33.2 g/dL (ref 30.0–36.0)
MCV: 92.2 fl (ref 78.0–100.0)
MONO ABS: 0.7 10*3/uL (ref 0.1–1.0)
Monocytes Relative: 7.1 % (ref 3.0–12.0)
NEUTROS ABS: 6.4 10*3/uL (ref 1.4–7.7)
NEUTROS PCT: 68 % (ref 43.0–77.0)
PLATELETS: 336 10*3/uL (ref 150.0–400.0)
RBC: 4.82 Mil/uL (ref 3.87–5.11)
RDW: 13 % (ref 11.5–15.5)
WBC: 9.4 10*3/uL (ref 4.0–10.5)

## 2017-10-17 LAB — COMPREHENSIVE METABOLIC PANEL
ALK PHOS: 57 U/L (ref 39–117)
ALT: 13 U/L (ref 0–35)
AST: 18 U/L (ref 0–37)
Albumin: 3.9 g/dL (ref 3.5–5.2)
BILIRUBIN TOTAL: 0.5 mg/dL (ref 0.2–1.2)
BUN: 15 mg/dL (ref 6–23)
CO2: 27 meq/L (ref 19–32)
Calcium: 9 mg/dL (ref 8.4–10.5)
Chloride: 105 mEq/L (ref 96–112)
Creatinine, Ser: 0.63 mg/dL (ref 0.40–1.20)
GFR: 97.73 mL/min (ref >60.00)
GLUCOSE: 93 mg/dL (ref 70–99)
POTASSIUM: 4 meq/L (ref 3.5–5.1)
SODIUM: 140 meq/L (ref 135–145)
TOTAL PROTEIN: 6.6 g/dL (ref 6.0–8.3)

## 2017-10-17 LAB — TSH: TSH: 0.22 u[IU]/mL — ABNORMAL LOW (ref 0.35–4.50)

## 2017-10-17 LAB — SEDIMENTATION RATE: SED RATE: 25 mm/h (ref 0–30)

## 2017-10-17 LAB — VITAMIN B12

## 2017-10-17 NOTE — Progress Notes (Signed)
Subjective:  Patient ID: Meagan Cox, female    DOB: 1941/01/25  Age: 77 y.o. MRN: [id number]  CC: The primary encounter diagnosis was Numbness. Diagnoses of Mouth ulcer, Vertigo of central origin of both ears, Travel advice encounter, Loss of perception for taste, Oral ulceration, Vertigo, and Hypothyroidism due to acquired atrophy of thyroid were also pertinent to this visit.  HPI Meagan Cox presents for  Evaluation of cranial nerve deficit.   Has been having numbness and burning sensation on the roof of her mouth  .lastb2 Lab Results  Component Value Date   VITAMINB12 >1500 (H) 10/17/2017    Travel medicine advice requested:  She is Going to [place]  [place]  In August 2020 wants to know if vaccinations are needed. Will be cruising the [place]   2 year history of numbness involving roof of mouth and 2/3 of tongue .  Loss of taste on left side and left side feels raw  And feels burned by carbonated drinks.   Also having occasional episodes of vertigo, occurring more than once a week.   History of fall with blunt occipital head trauma with LOC .  Did not seek medication attention.  Occurred ten years ago.   No history of brain iimaging,  MRI discussed      Exam:  Resolving ulcer on left side of tongue  Roof of mouth and buccal surgaces are norma    Outpatient Medications Prior to Visit  Medication Sig Dispense Refill  . ALPRAZolam (XANAX) 0.5 MG tablet Take 1 tablet (0.5 mg total) by mouth at bedtime as needed. 30 tablet 3  . diclofenac (CATAFLAM) 50 MG tablet TAKE ONE TABLET BY MOUTH TWICE DAILY 180 tablet 1  . estradiol (ESTRACE) 2 MG tablet TAKE 1 TABLET(2 MG) BY MOUTH DAILY 90 tablet 1  . Multiple Vitamin (MULTIVITAMIN) tablet Take 1 tablet by mouth daily.      . Omega-3 Fatty Acids (OMEGA-3 PO) Take 2 capsules by mouth daily.    Marland Kitchen levothyroxine (SYNTHROID, LEVOTHROID) 125 MCG tablet TAKE 1 TABLET BY MOUTH EVERY DAY 90 tablet 0     No facility-administered medications prior to visit.     Review of Systems;  Patient denies headache, fevers, malaise, unintentional weight loss, skin rash, eye pain, sinus congestion and sinus pain, sore throat, dysphagia,  hemoptysis , cough, dyspnea, wheezing, chest pain, palpitations, orthopnea, edema, abdominal pain, nausea, melena, diarrhea, constipation, flank pain, dysuria, hematuria, urinary  Frequency, nocturia, numbness, tingling, seizures,  Focal weakness, Loss of consciousness,  Tremor, insomnia, depression, anxiety, and suicidal ideation.      Objective:  BP 132/80 (BP Location: Left Arm, Patient Position: Sitting, Cuff Size: Normal)   Pulse 66   Temp 97.7 F (36.5 C) (Oral)   Resp 16   Ht 5' 4.5" (1.638 m)   Wt 204 lb 3.2 oz (92.6 kg)   SpO2 (!) 66%   BMI 34.51 kg/m   BP Readings from Last 3 Encounters:  10/17/17 132/80  06/19/17 128/70  03/04/17 (!) 160/88    Wt Readings from Last 3 Encounters:  10/17/17 204 lb 3.2 oz (92.6 kg)  06/19/17 208 lb 6.4 oz (94.5 kg)  03/04/17 211 lb 6.4 oz (95.9 kg)    General appearance: alert, cooperative and appears stated age Ears: normal TM's and external ear canals both ears Throat: lips, mucosa,normal; resolving superficial ulcer on left side of tongue, teeth and gums normal Neck: no adenopathy, no carotid bruit, supple,  symmetrical, trachea midline and thyroid not enlarged, symmetric, no tenderness/mass/nodules Back: symmetric, no curvature. ROM normal. No CVA tenderness. Lungs: clear to auscultation bilaterally Heart: regular rate and rhythm, S1, S2 normal, no murmur, click, rub or gallop Abdomen: soft, non-tender; bowel sounds normal; no masses,  no organomegaly Pulses: 2+ and symmetric Skin: Skin color, texture, turgor normal. No rashes or lesions Lymph nodes: Cervical, supraclavicular, and axillary nodes normal. Neuro: CNs 2-12 intact. DTRs 2+/4 in biceps, brachioradialis, patellars and achilles. Muscle strength  5/5 in upper and lower exremities. Fine resting tremor bilaterally both hands cerebellar function normal. Romberg negative.  No pronator drift.   Gait normal.   No results found for: HGBA1C  Lab Results  Component Value Date   CREATININE 0.63 10/17/2017   CREATININE 0.69 03/04/2017   CREATININE 0.66 01/18/2016    Lab Results  Component Value Date   WBC 9.4 10/17/2017   HGB 14.7 10/17/2017   HCT 44.4 10/17/2017   PLT 336.0 10/17/2017   GLUCOSE 93 10/17/2017   CHOL 213 (H) 03/04/2017   TRIG 200.0 (H) 03/04/2017   HDL 60.60 03/04/2017   LDLDIRECT 146.0 07/18/2015   LDLCALC 113 (H) 03/04/2017   ALT 13 10/17/2017   AST 18 10/17/2017   NA 140 10/17/2017   K 4.0 10/17/2017   CL 105 10/17/2017   CREATININE 0.63 10/17/2017   BUN 15 10/17/2017   CO2 27 10/17/2017   TSH 0.22 (L) 10/17/2017  ]   Assessment & Plan:   Problem List Items Addressed This Visit    Hypothyroidism    Thyroid function is overactive on 125 mcg dose .  Dose lowered to 112  Mcg daily       Relevant Medications   levothyroxine (SYNTHROID, LEVOTHROID) 112 MCG tablet   Loss of perception for taste    Affecting left side of tongue , accompanied by numbness of roof of mouth. Cranial nerve deficit suggestive of stroke or mass.  MR brain recommended.       Oral ulceration    She describes recurrent burning sensation on left side of tongue and a healing ulcer is noted.. ESR and ANA are normal/negative, so autoimmune causes (Behcet's) are less likely   Lab Results  Component Value Date   ESRSEDRATE 25 10/17/2017   Lab Results  Component Value Date   ANA Negative 10/17/2017          Travel advice encounter    Recommending typhoid parenteral vaccine and Hepatitis A vaccine for August 2020 trip to [place] and [place]      Vertigo    Episodic , occurring more than once weekly ,  With cranial nerve 9 also affected.  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 Weis's levothyroxine. I am also having her maintain her multivitamin, ALPRAZolam, diclofenac, estradiol, and Omega-3 Fatty Acids (OMEGA-3 PO).  Meds ordered this encounter  Medications  . levothyroxine (SYNTHROID, LEVOTHROID) 112 MCG tablet    Sig: Take 1 tablet (112 mcg total) by mouth daily.    Dispense:  90 tablet    Refill:  0    Medications Discontinued During This Encounter  Medication Reason  . levothyroxine (SYNTHROID, LEVOTHROID) 125 MCG tablet     Follow-up: No follow-ups on file.   Crecencio Mc, MD

## 2017-10-17 NOTE — Patient Instructions (Signed)
An MRI has been ordered of your brain to rule out stroke and pituitary tumor/mengioma (bening tumors) that may be compressing a nerve

## 2017-10-18 LAB — ANA W/REFLEX IF POSITIVE: Anti Nuclear Antibody(ANA): NEGATIVE

## 2017-10-19 DIAGNOSIS — R42 Dizziness and giddiness: Secondary | ICD-10-CM | POA: Insufficient documentation

## 2017-10-19 DIAGNOSIS — Z7184 Encounter for health counseling related to travel: Secondary | ICD-10-CM | POA: Insufficient documentation

## 2017-10-19 DIAGNOSIS — R481 Agnosia: Secondary | ICD-10-CM | POA: Insufficient documentation

## 2017-10-19 DIAGNOSIS — K121 Other forms of stomatitis: Secondary | ICD-10-CM | POA: Insufficient documentation

## 2017-10-19 MED ORDER — LEVOTHYROXINE SODIUM 112 MCG PO TABS: 112.0000 ug | ORAL_TABLET | Freq: Every day | ORAL | 0 refills | Status: DC

## 2017-10-19 NOTE — Assessment & Plan Note (Signed)
Episodic , occurring more than once weekly ,  With cranial nerve 9 also affected.  MRI needed with IAC's to rule out schwannomas , other masses and CVA  Given remote history of blunt head trauma

## 2017-10-19 NOTE — Assessment & Plan Note (Addendum)
Affecting left side of tongue , accompanied by numbness of roof of mouth. Cranial nerve deficit suggestive of stroke or mass.  MR brain recommended.

## 2017-10-19 NOTE — Assessment & Plan Note (Signed)
Thyroid function is overactive on 125 mcg dose .  Dose lowered to 112  Mcg daily

## 2017-10-19 NOTE — Assessment & Plan Note (Signed)
She describes recurrent burning sensation on left side of tongue and a healing ulcer is noted.. ESR and ANA are normal/negative, so autoimmune causes (Behcet's) are less likely   Lab Results  Component Value Date   ESRSEDRATE 25 10/17/2017   Lab Results  Component Value Date   ANA Negative 10/17/2017

## 2017-10-19 NOTE — Assessment & Plan Note (Signed)
Recommending typhoid parenteral vaccine and Hepatitis A vaccine for August 2020 trip to [place] and [place]

## 2017-10-21 ENCOUNTER — Encounter: Payer: PPO | Admitting: Physical Therapy

## 2017-10-23 LAB — VITAMIN B1: Vitamin B1 (Thiamine): 10 nmol/L (ref 8–30)

## 2017-11-01 ENCOUNTER — Ambulatory Visit: Payer: PPO

## 2017-11-11 ENCOUNTER — Ambulatory Visit: Payer: PPO

## 2017-11-12 ENCOUNTER — Ambulatory Visit
Admission: RE | Admit: 2017-11-12 | Discharge: 2017-11-12 | Disposition: A | Discharge status: Home / Self Care | Payer: PPO | Source: Ambulatory Visit | Attending: Internal Medicine | Admitting: Internal Medicine

## 2017-11-12 DIAGNOSIS — R42 Dizziness and giddiness: Secondary | ICD-10-CM | POA: Diagnosis not present

## 2017-11-12 DIAGNOSIS — IMO0001 Reserved for inherently not codable concepts without codable children: Secondary | ICD-10-CM

## 2017-11-12 DIAGNOSIS — R200 Anesthesia of skin: Secondary | ICD-10-CM | POA: Insufficient documentation

## 2017-11-12 MED ORDER — GADOBUTROL 1 MMOL/ML IV SOLN
9.0000 mL | Freq: Once | INTRAVENOUS | Status: AC | PRN
  Administered 2017-11-12: 9 mL via INTRAVENOUS

## 2017-12-09 ENCOUNTER — Encounter: Payer: PPO | Admitting: Physical Therapy

## 2017-12-12 ENCOUNTER — Other Ambulatory Visit: Payer: Self-pay | Admitting: Internal Medicine

## 2017-12-18 ENCOUNTER — Other Ambulatory Visit: Payer: Self-pay | Admitting: Internal Medicine

## 2017-12-23 ENCOUNTER — Ambulatory Visit: Payer: PPO | Admitting: Physical Therapy

## 2018-01-09 ENCOUNTER — Other Ambulatory Visit: Payer: Self-pay | Admitting: Internal Medicine

## 2018-01-17 ENCOUNTER — Telehealth: Payer: Self-pay | Admitting: Internal Medicine

## 2018-01-17 MED ORDER — TYPHOID VACCINE PO CPDR: 1.0000 | DELAYED_RELEASE_CAPSULE | ORAL | 0 refills | Status: DC

## 2018-01-17 NOTE — Telephone Encounter (Signed)
Patient voiced understanding and would like the oral medication.

## 2018-01-17 NOTE — Telephone Encounter (Signed)
GENERIC VIVOTIF SENT TO LOCAL  PHARMACY

## 2018-01-17 NOTE — Telephone Encounter (Signed)
Let her know that there is an oral medication I can send to her pharmacy.  The schedule is  4 doses;  One tablet  every other day until complete.  Must be completed one week prior to travel,  daughter was given it as well during her visit today  Rolene Arbour)   Would she prefer th oral?

## 2018-01-17 NOTE — Telephone Encounter (Signed)
Patient coming in nurse visit for HeP a/B for trip to [place], she is requesting script for typhoid parenteral vaccine?

## 2018-01-31 ENCOUNTER — Ambulatory Visit (INDEPENDENT_AMBULATORY_CARE_PROVIDER_SITE_OTHER): Payer: PPO | Admitting: *Deleted

## 2018-01-31 DIAGNOSIS — Z23 Encounter for immunization: Secondary | ICD-10-CM | POA: Diagnosis not present

## 2018-02-15 IMAGING — CR DG CHEST 2V
2 series · 2 of 2 positions shown · non-contrast
Comparison: None.

CLINICAL DATA: Patient started having lateral chest pain radiating
anterior and posterior since this afternoon [DATE]. Painful to take
deep breaths. Not able to find a comfortable position to sit.
Headache, nausea, vomiting x1. Hx htn, hypothyroidism, cardiac cath

EXAM:
CHEST  2 VIEW

[chest lat]
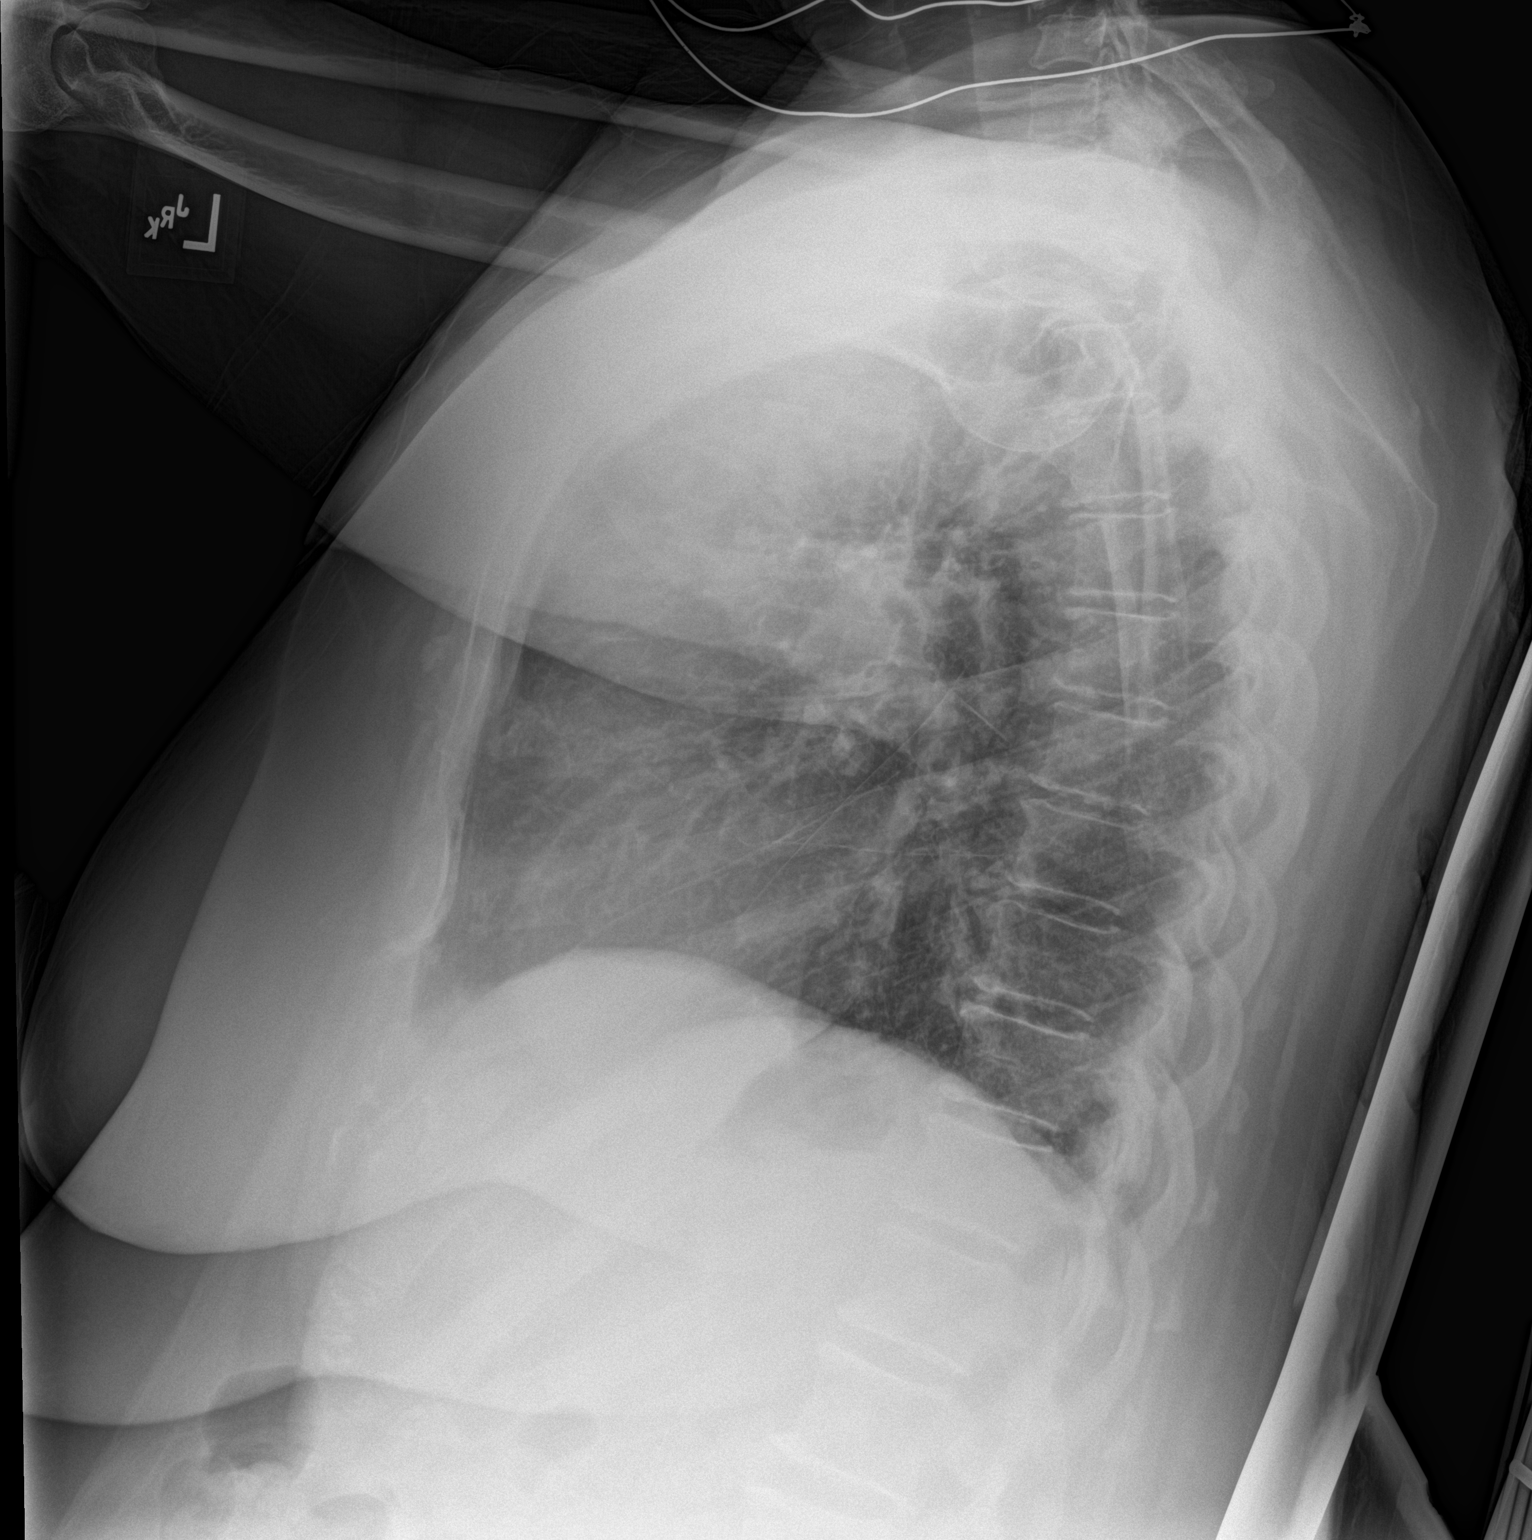

[chest ap]
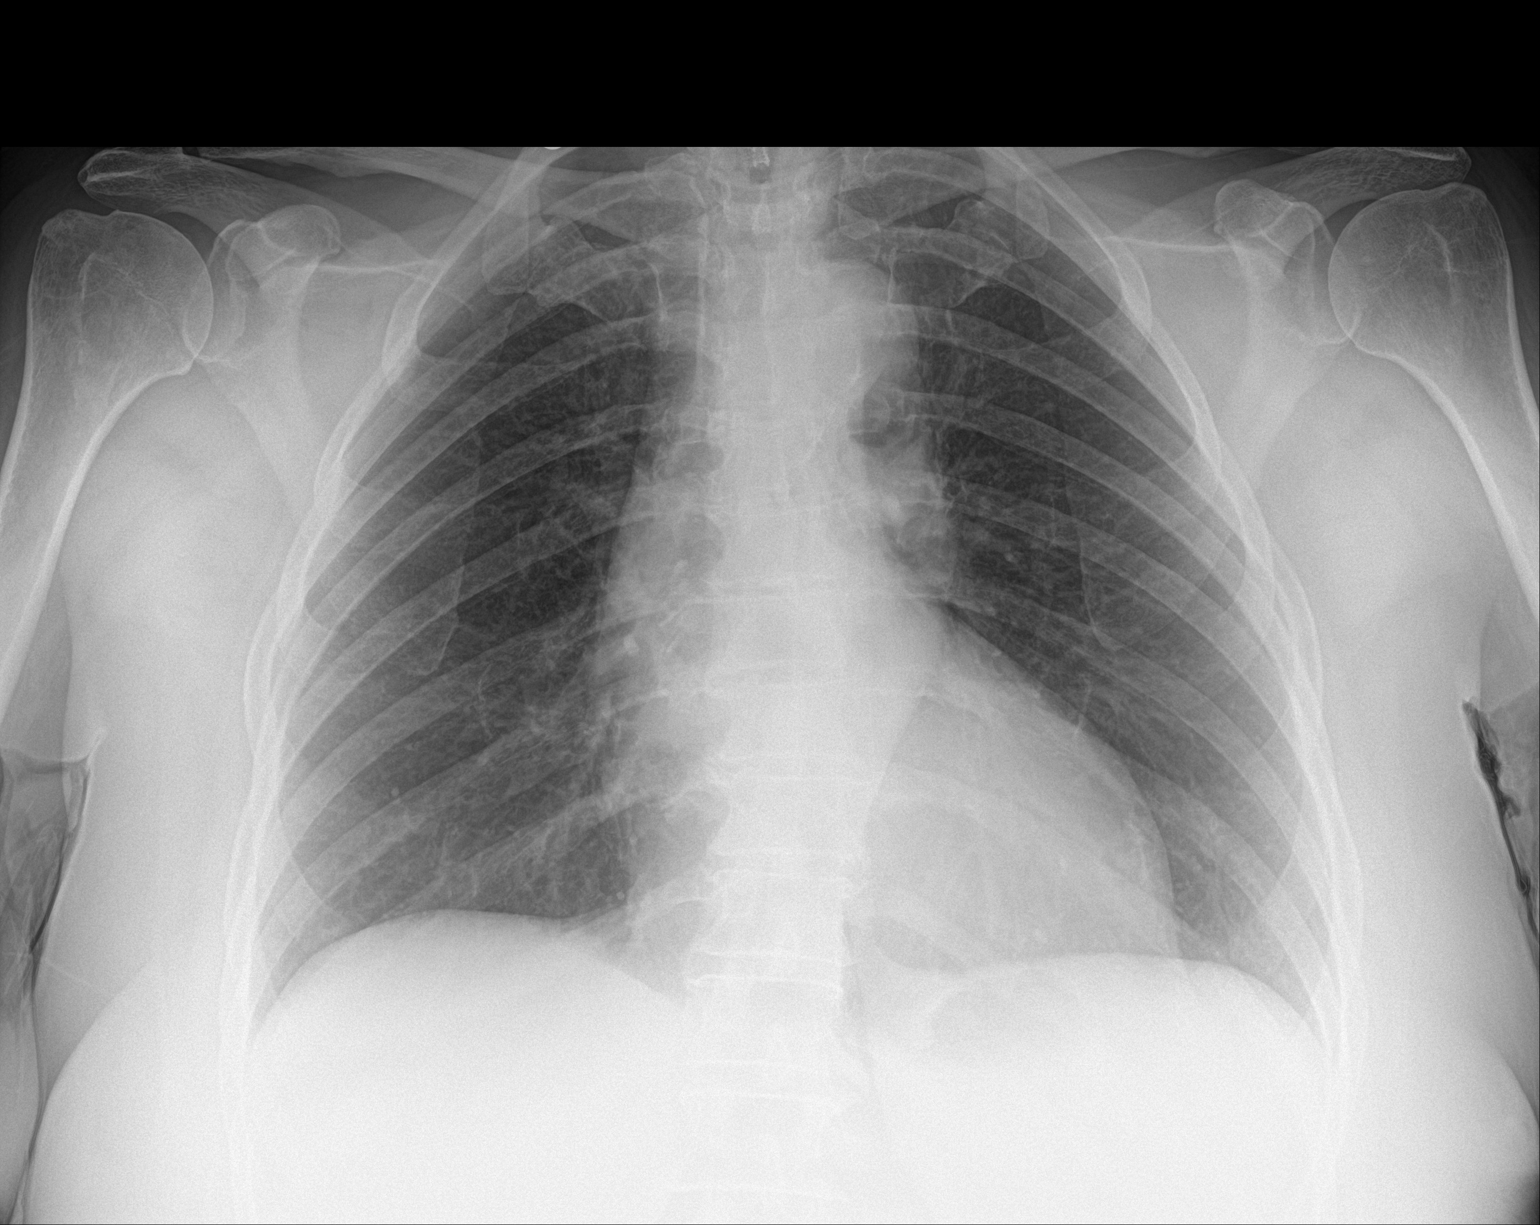

[2 of 2 positions shown; findings below may reference images not displayed]

FINDINGS: Lateral view degraded by patient arm position. Apical lordotic AP
frontal radiograph. Midline trachea. Normal heart size. Tortuous
thoracic aorta. Atherosclerosis in the transverse aorta. No pleural
effusion or pneumothorax. Clear lungs.
IMPRESSION: No acute cardiopulmonary disease.

Aortic atherosclerosis.

## 2018-02-21 DIAGNOSIS — H2513 Age-related nuclear cataract, bilateral: Secondary | ICD-10-CM | POA: Diagnosis not present

## 2018-03-04 ENCOUNTER — Ambulatory Visit (INDEPENDENT_AMBULATORY_CARE_PROVIDER_SITE_OTHER): Payer: PPO

## 2018-03-04 DIAGNOSIS — Z23 Encounter for immunization: Secondary | ICD-10-CM

## 2018-03-04 NOTE — Progress Notes (Addendum)
Patient presented today for 2nd dose of HepA/B vaccine. Pt voiced no concerns nor showed any signs of distress during injection.   Reviewed.  Dr Nicki Reaper

## 2018-03-24 ENCOUNTER — Other Ambulatory Visit: Payer: Self-pay | Admitting: Internal Medicine

## 2018-06-26 ENCOUNTER — Ambulatory Visit (INDEPENDENT_AMBULATORY_CARE_PROVIDER_SITE_OTHER): Payer: PPO | Admitting: Internal Medicine

## 2018-06-26 ENCOUNTER — Ambulatory Visit: Payer: PPO

## 2018-06-26 ENCOUNTER — Other Ambulatory Visit: Payer: Self-pay

## 2018-06-26 ENCOUNTER — Encounter: Payer: Self-pay | Admitting: Internal Medicine

## 2018-06-26 DIAGNOSIS — H8110 Benign paroxysmal vertigo, unspecified ear: Secondary | ICD-10-CM

## 2018-06-26 DIAGNOSIS — E7800 Pure hypercholesterolemia, unspecified: Secondary | ICD-10-CM

## 2018-06-26 DIAGNOSIS — R42 Dizziness and giddiness: Secondary | ICD-10-CM

## 2018-06-26 DIAGNOSIS — M25552 Pain in left hip: Secondary | ICD-10-CM | POA: Diagnosis not present

## 2018-06-26 DIAGNOSIS — E034 Atrophy of thyroid (acquired): Secondary | ICD-10-CM | POA: Diagnosis not present

## 2018-06-26 DIAGNOSIS — M25561 Pain in right knee: Secondary | ICD-10-CM | POA: Diagnosis not present

## 2018-06-26 NOTE — Progress Notes (Signed)
LMTCB

## 2018-06-26 NOTE — Patient Instructions (Addendum)
Resume alavert daily along with sudafed pe 10 mg every 6 hours for 3 daytime/evening dose   Meagan Cox will set up RN visit for BP check   And a steroid shot    You can add up to 2000 mg of acetominophen (tylenol) every day safely  In divided doses (500 mg every 6 hours  Or 1000 mg every 12 hours.)

## 2018-06-26 NOTE — Progress Notes (Signed)
Virtual Visit converted to Telephone  Note  This visit type was conducted due to national recommendations for restrictions regarding the COVID-19 pandemic (e.g. social distancing).  This format is felt to be most appropriate for this patient at this time.  All issues noted in this document were discussed and addressed.  No physical exam was performed (except for noted visual exam findings with Video Visits).   I attempted to connect with@ on 06/25/18 at  9:30 AM EDT by a video enabled telemedicine application ; however  Interactive audio and video telecommunications  Ultimately failed, due to patient having technical difficulties.   We continued and completed visit with audio only. I and verified that I am speaking with the correct person using two identifiers.  Location patient: home Location provider: work or home office Persons participating in the virtual visit: patient, provider  I discussed the limitations, risks, security and privacy concerns of performing an evaluation and management service by telephone and the availability of in person appointments. I also discussed with the patient that there may be a patient responsible charge related to this service. The patient expressed understanding and agreed to proceed.  Reason for visit: 6 month follow up   HPI:   The patient has no signs or symptoms of COVID 19 infection (fever, cough, sore throat  or shortness of breath beyond what is typical for patient).  Patient denies contact with other persons with the above mentioned symptoms or with anyone confirmed to have COVID 19 .  She has been minimizing her contact with the public and using a mask and hand sanitizer when she comes into any contact with the public.   Plans for [place] trip in august.  3rd hepatitis A/B vaccine is due this summer.  Advised to have it  On time. Vaccine due in august  Malaria on hold   Cc: vertigo, recurrent .  Happens turning over in bed, also gets vertigo with  presyncope if she stands up suddenly   Stopped taking alavert recently and nose is running    Torn ACL  ,right  Knee,  Having some daily  pain and  Daily persistent swelling ,   Has [facility] Dr. Harlow Mares  looking at it to consider laparoscopic surgery,  Using voltaren gel     ROS: See pertinent positives and negatives per HPI.  Past Medical History:  Diagnosis Date  . Hyperlipidemia   . Hypertension   . Hypothyroidism     Past Surgical History:  Procedure Laterality Date  . ABDOMINAL HYSTERECTOMY    . CARDIAC CATHETERIZATION     30 years ago, [place]  . CHOLECYSTECTOMY N/A 08/21/2015   Procedure: LAPAROSCOPIC CHOLECYSTECTOMY;  Surgeon: Jules Husbands, MD;  Location: [facility] ORS;  Service: General;  Laterality: N/A;  . EYE SURGERY     eye lid lift  . PARTIAL HYSTERECTOMY    . TONSILLECTOMY      Family History  Problem Relation Age of Onset  . Heart disease Mother        heart problems  . Hypothyroidism Mother   . Cancer Father        lymphoma    SOCIAL HX:  Social History   Social History Narrative  . Not on file     Current Outpatient Medications:  .  ALPRAZolam (XANAX) 0.5 MG tablet, Take 1 tablet (0.5 mg total) by mouth at bedtime as needed., Disp: 30 tablet, Rfl: 3 .  diclofenac (CATAFLAM) 50 MG tablet, TAKE ONE TABLET  BY MOUTH TWICE DAILY, Disp: 180 tablet, Rfl: 1 .  levothyroxine (SYNTHROID, LEVOTHROID) 112 MCG tablet, TAKE 1 TABLET(112 MCG) BY MOUTH DAILY, Disp: 90 tablet, Rfl: 0 .  levothyroxine (SYNTHROID, LEVOTHROID) 125 MCG tablet, TAKE 1 TABLET BY MOUTH EVERY DAY, Disp: 90 tablet, Rfl: 0 .  Multiple Vitamin (MULTIVITAMIN) tablet, Take 1 tablet by mouth daily.  , Disp: , Rfl:  .  Omega-3 Fatty Acids (OMEGA-3 PO), Take 2 capsules by mouth daily., Disp: , Rfl:  .  typhoid (VIVOTIF) DR capsule, Take 1 capsule by mouth every other day., Disp: 4 capsule, Rfl: 0 .  estradiol (ESTRACE) 2 MG tablet, TAKE 1 TABLET(2 MG) BY MOUTH DAILY, Disp: 90 tablet, Rfl:  1  EXAM:   General impression: alert, cooperative and articulate.  No signs of being in distress  Lungs: speech is fluent sentence length suggests that patient is not short of breath and not punctuated by cough, sneezing or sniffing. Marland Kitchen   Psych: affect normal.  speech is articulate and non pressured .  Denies suicidal thoughts  ASSESSMENT AND PLAN:  Discussed the following assessment and plan:  Hypothyroidism due to acquired atrophy of thyroid - Plan: TSH  Pure hypercholesterolemia  Vertigo - Plan: Comprehensive metabolic panel, CBC with Differential/Platelet  Benign paroxysmal positional vertigo, unspecified laterality  Right medial knee pain  Vertigo Appears to be precipitated by allergic rhinitis.  Advised to resume antihistamine and decongestant .  Does not tolerate oral prednisone; IM steroid injection ordered to be done by RN  40 Depo Medrol   Benign paroxysmal positional vertigo Recurrent episode,  With MRI with IAC's done IN October.  Current episode may be aggravated by allergies and Eustachian tube dysfunction . Resume antihistamine and steroid via IM injection early next week  Right medial knee pain Secondary to torn acl.  Using voltaren gel,  Adding tylenol , awaiting Orthopedic reevaluation     I discussed the assessment and treatment plan with the patient. The patient was provided an opportunity to ask questions and all were answered. The patient agreed with the plan and demonstrated an understanding of the instructions.   The patient was advised to call back or seek an in-person evaluation if the symptoms worsen or if the condition fails to improve as anticipated.  I provided 25 minutes of non-face-to-face time during this encounter.   Crecencio Mc, MD

## 2018-06-27 ENCOUNTER — Other Ambulatory Visit (INDEPENDENT_AMBULATORY_CARE_PROVIDER_SITE_OTHER): Payer: PPO

## 2018-06-27 ENCOUNTER — Ambulatory Visit: Payer: PPO | Admitting: *Deleted

## 2018-06-27 ENCOUNTER — Other Ambulatory Visit: Payer: Self-pay

## 2018-06-27 DIAGNOSIS — R42 Dizziness and giddiness: Secondary | ICD-10-CM

## 2018-06-27 DIAGNOSIS — E034 Atrophy of thyroid (acquired): Secondary | ICD-10-CM

## 2018-06-27 LAB — CBC WITH DIFFERENTIAL/PLATELET
Basophils Absolute: 0.1 10*3/uL (ref 0.0–0.1)
Basophils Relative: 1 % (ref 0.0–3.0)
Eosinophils Absolute: 0.1 10*3/uL (ref 0.0–0.7)
Eosinophils Relative: 1 % (ref 0.0–5.0)
HCT: 44.9 % (ref 36.0–46.0)
Hemoglobin: 15.3 g/dL — ABNORMAL HIGH (ref 12.0–15.0)
Lymphocytes Relative: 23 % (ref 12.0–46.0)
Lymphs Abs: 2.1 10*3/uL (ref 0.7–4.0)
MCHC: 34.1 g/dL (ref 30.0–36.0)
MCV: 92.1 fl (ref 78.0–100.0)
Monocytes Absolute: 0.5 10*3/uL (ref 0.1–1.0)
Monocytes Relative: 5.8 % (ref 3.0–12.0)
Neutro Abs: 6.2 10*3/uL (ref 1.4–7.7)
Neutrophils Relative %: 69.2 % (ref 43.0–77.0)
Platelets: 277 10*3/uL (ref 150.0–400.0)
RBC: 4.87 Mil/uL (ref 3.87–5.11)
RDW: 12.8 % (ref 11.5–15.5)
WBC: 8.9 10*3/uL (ref 4.0–10.5)

## 2018-06-27 LAB — COMPREHENSIVE METABOLIC PANEL
ALT: 8 U/L (ref 0–35)
AST: 13 U/L (ref 0–37)
Albumin: 3.7 g/dL (ref 3.5–5.2)
Alkaline Phosphatase: 64 U/L (ref 39–117)
BUN: 16 mg/dL (ref 6–23)
CO2: 27 mEq/L (ref 19–32)
Calcium: 9 mg/dL (ref 8.4–10.5)
Chloride: 104 mEq/L (ref 96–112)
Creatinine, Ser: 0.69 mg/dL (ref 0.40–1.20)
GFR: 82.63 mL/min (ref >60.00)
Glucose, Bld: 92 mg/dL (ref 70–99)
Potassium: 4.3 mEq/L (ref 3.5–5.1)
Sodium: 140 mEq/L (ref 135–145)
Total Bilirubin: 0.4 mg/dL (ref 0.2–1.2)
Total Protein: 6.1 g/dL (ref 6.0–8.3)

## 2018-06-27 LAB — TSH: TSH: 0.87 u[IU]/mL (ref 0.35–4.50)

## 2018-06-27 MED ORDER — METHYLPREDNISOLONE ACETATE 40 MG/ML IJ SUSP
40.0000 mg | Freq: Once | INTRAMUSCULAR | Status: AC
  Administered 2018-06-27: 40 mg via INTRAMUSCULAR

## 2018-06-27 MED ORDER — ESTRADIOL 2 MG PO TABS: ORAL_TABLET | ORAL | 1 refills | Status: DC

## 2018-06-27 NOTE — Progress Notes (Signed)
Depo medrol given left arm tolerated well Called patient. Patient also had Ortho static pressure attained  Lying 128/80 pulse 65 sitting 118/60 pulse 71 standing 100/60 pulse 82 standing 3 minutes 100/60 pulse 92 .

## 2018-06-28 NOTE — Assessment & Plan Note (Signed)
Secondary to torn acl.  Using voltaren gel,  Adding tylenol , awaiting Orthopedic reevaluation

## 2018-06-28 NOTE — Assessment & Plan Note (Signed)
Thyroid function is WNL on current dose.  125 mcg daily  No current changes needed.   Lab Results  Component Value Date   TSH 0.87 06/27/2018

## 2018-06-28 NOTE — Assessment & Plan Note (Signed)
Recurrent episode,  With MRI with IAC's done IN October.  Current episode may be aggravated by allergies and Eustachian tube dysfunction . Resume antihistamine and steroid via IM injection early next week

## 2018-06-28 NOTE — Assessment & Plan Note (Addendum)
Appears to be precipitated by allergic rhinitis.  Advised to resume antihistamine and decongestant .  Does not tolerate oral prednisone; IM steroid injection ordered to be done by RN  40 Depo Medrol

## 2018-07-01 ENCOUNTER — Other Ambulatory Visit: Payer: Self-pay

## 2018-07-02 MED ORDER — LEVOTHYROXINE SODIUM 112 MCG PO TABS: ORAL_TABLET | ORAL | 1 refills | Status: DC

## 2018-07-04 ENCOUNTER — Other Ambulatory Visit: Payer: Self-pay

## 2018-07-04 ENCOUNTER — Encounter: Payer: Self-pay | Admitting: Internal Medicine

## 2018-07-04 ENCOUNTER — Ambulatory Visit (INDEPENDENT_AMBULATORY_CARE_PROVIDER_SITE_OTHER): Payer: PPO | Admitting: Internal Medicine

## 2018-07-04 DIAGNOSIS — I10 Essential (primary) hypertension: Secondary | ICD-10-CM | POA: Diagnosis not present

## 2018-07-04 DIAGNOSIS — E034 Atrophy of thyroid (acquired): Secondary | ICD-10-CM

## 2018-07-04 DIAGNOSIS — I951 Orthostatic hypotension: Secondary | ICD-10-CM | POA: Diagnosis not present

## 2018-07-04 MED ORDER — METOPROLOL SUCCINATE ER 25 MG PO TB24: 25.0000 mg | ORAL_TABLET | Freq: Every day | ORAL | 3 refills | Status: DC

## 2018-07-04 NOTE — Assessment & Plan Note (Addendum)
CORRECTION  Thyroid fxn is normal on 112 mcg  Dose.

## 2018-07-04 NOTE — Telephone Encounter (Signed)
On schedule for today. FYI

## 2018-07-04 NOTE — Patient Instructions (Addendum)
I am starting you on metoprolol succinate ( Toprox XL) for your blood pressure.  The dose is  25 mg daily in the evening.   Record your blood pressure  and pulse daily for one week and send me results   If you do not tolerate the medication taken at night.  Stop it immediately and notify me so I can prescribe an alternative

## 2018-07-04 NOTE — Progress Notes (Signed)
Subjective:  Patient ID: [name], female    DOB: 04/07/1940  Age: 78 y.o. MRN: [id number]  CC: Diagnoses of Hypothyroidism due to acquired atrophy of thyroid, Orthostasis, and Essential hypertension were pertinent to this visit.  HPI Ludwika Rodd presents for evaluation of elevated blood pressure and recurrent dizzy spells/vertigo.  symptoms  Of dizzines are chronic and have been occurring since last fall.  The episodes occur with sudden change in position,  From sitting to standing .  Other episodes of sudden onset occur at rest and are  accompanied by  tunnel vision systolic .   She has not been hypotensive during any of the episodes.  Her home BPs have been rather elevated.  With readings consistently > 619, diastolic > 90.  Most recently her home reading was  180/105 .  Workup last year included an MRI of the brain with IACs   Home bps have been 180/105, 170/102.  The lowest seen was 129/92 pulse not checked bu in office noted to be regular and  68   Still having anosmia for certain odors (can still smell some odors )and dysgeusia on the left side of tongue since Sept 2019 .    Outpatient Medications Prior to Visit  Medication Sig Dispense Refill  . ALPRAZolam (XANAX) 0.5 MG tablet Take 1 tablet (0.5 mg total) by mouth at bedtime as needed. 30 tablet 3  . diclofenac (CATAFLAM) 50 MG tablet TAKE ONE TABLET BY MOUTH TWICE DAILY 180 tablet 1  . estradiol (ESTRACE) 2 MG tablet TAKE 1 TABLET(2 MG) BY MOUTH DAILY 90 tablet 1  . levothyroxine (SYNTHROID) 112 MCG tablet TAKE 1 TABLET(112 MCG) BY MOUTH DAILY 90 tablet 1  . Multiple Vitamin (MULTIVITAMIN) tablet Take 1 tablet by mouth daily.      . Omega-3 Fatty Acids (OMEGA-3 PO) Take 2 capsules by mouth daily.    . typhoid (VIVOTIF) DR capsule Take 1 capsule by mouth every other day. 4 capsule 0  . levothyroxine (SYNTHROID, LEVOTHROID) 125 MCG tablet TAKE 1 TABLET BY MOUTH EVERY DAY 90 tablet 0   No facility-administered  medications prior to visit.     Review of Systems;  Patient denies headache, fevers, malaise, unintentional weight loss, skin rash, eye pain, sinus congestion and sinus pain, sore throat, dysphagia,  hemoptysis , cough, dyspnea, wheezing, chest pain, palpitations, orthopnea, edema, abdominal pain, nausea, melena, diarrhea, constipation, flank pain, dysuria, hematuria, urinary  Frequency, nocturia, numbness, tingling, seizures,  Focal weakness, Loss of consciousness,  Tremor, insomnia, depression, anxiety, and suicidal ideation.      Objective:  BP (!) 160/110 (BP Location: Left Arm, Patient Position: Sitting, Cuff Size: Normal)   Pulse 79   Temp 98.2 F (36.8 C) (Oral)   Resp 16   Ht 5' 4.5" (1.638 m)   Wt 202 lb 12.8 oz (92 kg)   SpO2 97%   BMI 34.27 kg/m   BP Readings from Last 3 Encounters:  07/04/18 (!) 160/110  10/17/17 132/80  06/19/17 128/70    Wt Readings from Last 3 Encounters:  07/04/18 202 lb 12.8 oz (92 kg)  10/17/17 204 lb 3.2 oz (92.6 kg)  06/19/17 208 lb 6.4 oz (94.5 kg)    General appearance: alert, cooperative and appears stated age Ears: normal TM's and external ear canals both ears Throat: lips, mucosa, and tongue normal; teeth and gums normal Neck: no adenopathy, no carotid bruit, supple, symmetrical, trachea midline and thyroid not enlarged, symmetric, no tenderness/mass/nodules Back: symmetric, no  curvature. ROM normal. No CVA tenderness. Lungs: clear to auscultation bilaterally Heart: regular rate and rhythm, S1, S2 normal, no murmur, click, rub or gallop Abdomen: soft, non-tender; bowel sounds normal; no masses,  no organomegaly Pulses: 2+ and symmetric Skin: Skin color, texture, turgor normal. No rashes or lesions Lymph nodes: Cervical, supraclavicular, and axillary nodes normal.  No results found for: HGBA1C  Lab Results  Component Value Date   CREATININE 0.69 06/27/2018   CREATININE 0.63 10/17/2017   CREATININE 0.69 03/04/2017    Lab  Results  Component Value Date   WBC 8.9 06/27/2018   HGB 15.3 (H) 06/27/2018   HCT 44.9 06/27/2018   PLT 277.0 06/27/2018   GLUCOSE 92 06/27/2018   CHOL 213 (H) 03/04/2017   TRIG 200.0 (H) 03/04/2017   HDL 60.60 03/04/2017   LDLDIRECT 146.0 07/18/2015   LDLCALC 113 (H) 03/04/2017   ALT 8 06/27/2018   AST 13 06/27/2018   NA 140 06/27/2018   K 4.3 06/27/2018   CL 104 06/27/2018   CREATININE 0.69 06/27/2018   BUN 16 06/27/2018   CO2 27 06/27/2018   TSH 0.87 06/27/2018    Mr Brain/iac W Wo Contrast  Result Date: 11/12/2017 CLINICAL DATA:  78 year old female with progressive vertigo for 6-7 months. Symptoms began after a fall 2 years ago. EXAM: MRI HEAD WITHOUT AND WITH CONTRAST TECHNIQUE: Multiplanar, multiecho pulse sequences of the brain and surrounding structures were obtained without and with intravenous contrast. CONTRAST:  9 milliliters Gadavist COMPARISON:  Brain MRI 10/20/2013. FINDINGS: Brain: No restricted diffusion to suggest acute infarction. No midline shift, mass effect, evidence of mass lesion, ventriculomegaly, extra-axial collection or acute intracranial hemorrhage. Cervicomedullary junction and pituitary are within normal limits. Mild generalized cerebral volume loss since 2015. Patchy and scattered cerebral white matter T2 and FLAIR hyperintensity has also mildly increased. No cortical encephalomalacia or chronic cerebral blood products. Mild T2 heterogeneity in the pons appears stable. Mild T2 heterogeneity in the bilateral basal ganglia has increased, but appears partially due to perivascular spaces. Minor T2 heterogeneity in the thalami. The cerebellum remains normal. No abnormal enhancement identified. No dural thickening. Vascular: Major intracranial vascular flow voids are stable since 2015. Mild intracranial artery tortuosity. Skull and upper cervical spine: Mild for age visible cervical spine degeneration. Visualized bone marrow signal is within normal limits.  Sinuses/Orbits: Stable and negative. Scalp and face soft tissues appear negative. Other: Dedicated internal auditory canal imaging. Normal cerebellopontine angles. Normal bilateral cisternal and intracanalicular 7th and 8th cranial nerve segments. Symmetric and normal appearing T2 signal in the bilateral cochlea and vestibular structures. Bilateral mastoid air cells are stable and well pneumatized. No abnormal enhancement identified. Normal stylomastoid foramina. Both parotid glands appear negative. IMPRESSION: 1. Negative internal auditory imaging. 2. No acute intracranial abnormality. Stable to mild progression since 2015 of signal changes in the cerebral white matter, basal ganglia, and pons suggestive of chronic small vessel disease. Electronically Signed   By: Genevie Ann M.D.   On: 11/12/2017 11:59    Assessment & Plan:   Problem List Items Addressed This Visit    Hypothyroidism    CORRECTION  Thyroid fxn is normal on 112 mcg  Dose.        Relevant Medications   metoprolol succinate (TOPROL-XL) 25 MG 24 hr tablet   Hypertension    Confirmed by home readings  Starting Toprol  XL given history of CAD       Relevant Medications   metoprolol succinate (TOPROL-XL) 25 MG 24  hr tablet   Orthostasis    symptoms are not supported by drop in BP .  Given her history of orthostasis,  CAD and hypertension will start torol XL 25 mg bedtime.  Follow up one week       Relevant Medications   metoprolol succinate (TOPROL-XL) 25 MG 24 hr tablet      I am having Elnita Maxwell. Noguez start on metoprolol succinate. I am also having her maintain her multivitamin, ALPRAZolam, diclofenac, Omega-3 Fatty Acids (OMEGA-3 PO), typhoid, estradiol, and levothyroxine.  Meds ordered this encounter  Medications  . metoprolol succinate (TOPROL-XL) 25 MG 24 hr tablet    Sig: Take 1 tablet (25 mg total) by mouth daily.    Dispense:  30 tablet    Refill:  3    Medications Discontinued During This Encounter  Medication  Reason  . levothyroxine (SYNTHROID, LEVOTHROID) 125 MCG tablet     Follow-up: No follow-ups on file.   Crecencio Mc, MD

## 2018-07-06 DIAGNOSIS — I951 Orthostatic hypotension: Secondary | ICD-10-CM | POA: Insufficient documentation

## 2018-07-06 NOTE — Assessment & Plan Note (Signed)
Confirmed by home readings  Starting Toprol  XL given history of CAD

## 2018-07-06 NOTE — Assessment & Plan Note (Signed)
symptoms are not supported by drop in BP .  Given her history of orthostasis,  CAD and hypertension will start torol XL 25 mg bedtime.  Follow up one week

## 2018-07-08 DIAGNOSIS — M25561 Pain in right knee: Secondary | ICD-10-CM | POA: Diagnosis not present

## 2018-07-31 ENCOUNTER — Ambulatory Visit (INDEPENDENT_AMBULATORY_CARE_PROVIDER_SITE_OTHER): Payer: PPO

## 2018-07-31 ENCOUNTER — Other Ambulatory Visit: Payer: Self-pay

## 2018-07-31 DIAGNOSIS — Z23 Encounter for immunization: Secondary | ICD-10-CM | POA: Diagnosis not present

## 2018-08-11 DIAGNOSIS — M25561 Pain in right knee: Secondary | ICD-10-CM | POA: Diagnosis not present

## 2018-08-14 DIAGNOSIS — S83231A Complex tear of medial meniscus, current injury, right knee, initial encounter: Secondary | ICD-10-CM | POA: Diagnosis not present

## 2018-08-14 DIAGNOSIS — M7061 Trochanteric bursitis, right hip: Secondary | ICD-10-CM | POA: Diagnosis not present

## 2018-08-18 ENCOUNTER — Ambulatory Visit: Payer: Self-pay | Admitting: Orthopedic Surgery

## 2018-08-18 DIAGNOSIS — Z01818 Encounter for other preprocedural examination: Secondary | ICD-10-CM | POA: Diagnosis not present

## 2018-08-18 DIAGNOSIS — S83231D Complex tear of medial meniscus, current injury, right knee, subsequent encounter: Secondary | ICD-10-CM | POA: Diagnosis not present

## 2018-08-25 ENCOUNTER — Other Ambulatory Visit
Admission: RE | Admit: 2018-08-25 | Discharge: 2018-08-25 | Disposition: A | Discharge status: Home / Self Care | Payer: PPO | Source: Ambulatory Visit | Attending: Orthopedic Surgery | Admitting: Orthopedic Surgery

## 2018-08-25 ENCOUNTER — Other Ambulatory Visit: Payer: Self-pay

## 2018-08-25 DIAGNOSIS — Z20828 Contact with and (suspected) exposure to other viral communicable diseases: Secondary | ICD-10-CM | POA: Insufficient documentation

## 2018-08-25 DIAGNOSIS — Z01812 Encounter for preprocedural laboratory examination: Secondary | ICD-10-CM | POA: Insufficient documentation

## 2018-08-25 LAB — CBC
HCT: 45.5 % (ref 36.0–46.0)
Hemoglobin: 15 g/dL (ref 12.0–15.0)
MCH: 30.7 pg (ref 26.0–34.0)
MCHC: 33 g/dL (ref 30.0–36.0)
MCV: 93 fL (ref 80.0–100.0)
Platelets: 302 10*3/uL (ref 150–400)
RBC: 4.89 MIL/uL (ref 3.87–5.11)
RDW: 12.4 % (ref 11.5–15.5)
WBC: 17.1 10*3/uL — ABNORMAL HIGH (ref 4.0–10.5)
nRBC: 0 % (ref 0.0–0.2)

## 2018-08-25 LAB — BASIC METABOLIC PANEL
Anion gap: 9 (ref 5–15)
BUN: 24 mg/dL — ABNORMAL HIGH (ref 8–23)
CO2: 25 mmol/L (ref 22–32)
Calcium: 9.1 mg/dL (ref 8.9–10.3)
Chloride: 105 mmol/L (ref 98–111)
Creatinine, Ser: 0.75 mg/dL (ref 0.44–1.00)
GFR calc Af Amer: >60 mL/min (ref >60)
GFR calc non Af Amer: >60 mL/min (ref >60)
Glucose, Bld: 89 mg/dL (ref 70–99)
Potassium: 4.3 mmol/L (ref 3.5–5.1)
Sodium: 139 mmol/L (ref 135–145)

## 2018-08-25 LAB — SARS CORONAVIRUS 2 (TAT 6-24 HRS): SARS Coronavirus 2: NEGATIVE

## 2018-08-27 NOTE — Anesthesia Preprocedure Evaluation (Addendum)
Anesthesia Evaluation  Patient identified by MRN, date of birth, ID band Patient awake    Reviewed: Allergy & Precautions, NPO status , Patient's Chart, lab work & pertinent test results  History of Anesthesia Complications Negative for: history of anesthetic complications  Airway Mallampati: I   Neck ROM: Full    Dental no notable dental hx.    Pulmonary former smoker (quit 1990),    Pulmonary exam normal breath sounds clear to auscultation       Cardiovascular hypertension, + CAD  Normal cardiovascular exam Rhythm:Regular Rate:Normal     Neuro/Psych PSYCHIATRIC DISORDERS Anxiety    GI/Hepatic negative GI ROS,   Endo/Other  Hypothyroidism   Renal/GU negative Renal ROS     Musculoskeletal  (+) Arthritis ,   Abdominal   Peds  Hematology negative hematology ROS (+)   Anesthesia Other Findings   Reproductive/Obstetrics                            Anesthesia Physical Anesthesia Plan  ASA: II  Anesthesia Plan: General   Post-op Pain Management:    Induction: Intravenous  PONV Risk Score and Plan: 3 and Dexamethasone and Ondansetron  Airway Management Planned: LMA  Additional Equipment:   Intra-op Plan:   Post-operative Plan: Extubation in OR  Informed Consent: I have reviewed the patients History and Physical, chart, labs and discussed the procedure including the risks, benefits and alternatives for the proposed anesthesia with the patient or authorized representative who has indicated his/her understanding and acceptance.       Plan Discussed with: CRNA  Anesthesia Plan Comments:        Anesthesia Quick Evaluation

## 2018-08-28 ENCOUNTER — Other Ambulatory Visit: Payer: Self-pay

## 2018-08-28 ENCOUNTER — Encounter
Admission: RE | Disposition: A | Discharge status: Home / Self Care | Payer: Self-pay | Source: Home / Self Care | Attending: Orthopedic Surgery

## 2018-08-28 ENCOUNTER — Ambulatory Visit
Admission: RE | Admit: 2018-08-28 | Discharge: 2018-08-28 | Disposition: A | Discharge status: Home / Self Care | Payer: PPO | Attending: Orthopedic Surgery | Admitting: Orthopedic Surgery

## 2018-08-28 ENCOUNTER — Ambulatory Visit: Payer: PPO | Admitting: Anesthesiology

## 2018-08-28 DIAGNOSIS — M94261 Chondromalacia, right knee: Secondary | ICD-10-CM | POA: Insufficient documentation

## 2018-08-28 DIAGNOSIS — E039 Hypothyroidism, unspecified: Secondary | ICD-10-CM | POA: Diagnosis not present

## 2018-08-28 DIAGNOSIS — G4730 Sleep apnea, unspecified: Secondary | ICD-10-CM | POA: Insufficient documentation

## 2018-08-28 DIAGNOSIS — S83511A Sprain of anterior cruciate ligament of right knee, initial encounter: Secondary | ICD-10-CM | POA: Insufficient documentation

## 2018-08-28 DIAGNOSIS — M23261 Derangement of other lateral meniscus due to old tear or injury, right knee: Secondary | ICD-10-CM | POA: Diagnosis not present

## 2018-08-28 DIAGNOSIS — S83282A Other tear of lateral meniscus, current injury, left knee, initial encounter: Secondary | ICD-10-CM | POA: Diagnosis not present

## 2018-08-28 DIAGNOSIS — S83211A Bucket-handle tear of medial meniscus, current injury, right knee, initial encounter: Secondary | ICD-10-CM | POA: Insufficient documentation

## 2018-08-28 DIAGNOSIS — I10 Essential (primary) hypertension: Secondary | ICD-10-CM | POA: Insufficient documentation

## 2018-08-28 DIAGNOSIS — M659 Synovitis and tenosynovitis, unspecified: Secondary | ICD-10-CM | POA: Insufficient documentation

## 2018-08-28 DIAGNOSIS — X58XXXA Exposure to other specified factors, initial encounter: Secondary | ICD-10-CM | POA: Insufficient documentation

## 2018-08-28 DIAGNOSIS — Z96649 Presence of unspecified artificial hip joint: Secondary | ICD-10-CM | POA: Diagnosis not present

## 2018-08-28 DIAGNOSIS — S83212A Bucket-handle tear of medial meniscus, current injury, left knee, initial encounter: Secondary | ICD-10-CM | POA: Diagnosis not present

## 2018-08-28 DIAGNOSIS — M65861 Other synovitis and tenosynovitis, right lower leg: Secondary | ICD-10-CM | POA: Diagnosis not present

## 2018-08-28 DIAGNOSIS — Z79899 Other long term (current) drug therapy: Secondary | ICD-10-CM | POA: Diagnosis not present

## 2018-08-28 DIAGNOSIS — I2510 Atherosclerotic heart disease of native coronary artery without angina pectoris: Secondary | ICD-10-CM | POA: Diagnosis not present

## 2018-08-28 DIAGNOSIS — Z79890 Hormone replacement therapy (postmenopausal): Secondary | ICD-10-CM | POA: Insufficient documentation

## 2018-08-28 DIAGNOSIS — Z87891 Personal history of nicotine dependence: Secondary | ICD-10-CM | POA: Diagnosis not present

## 2018-08-28 DIAGNOSIS — M23241 Derangement of anterior horn of lateral meniscus due to old tear or injury, right knee: Secondary | ICD-10-CM | POA: Insufficient documentation

## 2018-08-28 HISTORY — PX: KNEE ARTHROSCOPY: SHX127

## 2018-08-28 SURGERY — ARTHROSCOPY, KNEE
Anesthesia: General | Site: Knee | Laterality: Right

## 2018-08-28 MED ORDER — ROPIVACAINE HCL 5 MG/ML IJ SOLN
INTRAMUSCULAR | Status: DC | PRN
  Administered 2018-08-28: 20 mL

## 2018-08-28 MED ORDER — HYDROCODONE-ACETAMINOPHEN 5-325 MG PO TABS: 1.0000 | ORAL_TABLET | Freq: Four times a day (QID) | ORAL | 0 refills | Status: DC | PRN

## 2018-08-28 MED ORDER — CELECOXIB 400 MG PO CAPS
400.0000 mg | ORAL_CAPSULE | Freq: Once | ORAL | Status: AC
  Administered 2018-08-28: 11:00:00 400 mg via ORAL

## 2018-08-28 MED ORDER — GABAPENTIN 300 MG PO CAPS
300.0000 mg | ORAL_CAPSULE | Freq: Once | ORAL | Status: AC
  Administered 2018-08-28: 300 mg via ORAL

## 2018-08-28 MED ORDER — OXYCODONE HCL 5 MG PO TABS
5.0000 mg | ORAL_TABLET | Freq: Once | ORAL | Status: AC
  Administered 2018-08-28: 14:00:00 5 mg via ORAL

## 2018-08-28 MED ORDER — ACETAMINOPHEN 500 MG PO TABS
1000.0000 mg | ORAL_TABLET | Freq: Once | ORAL | Status: AC
  Administered 2018-08-28: 11:00:00 975 mg via ORAL

## 2018-08-28 MED ORDER — ACETAMINOPHEN 160 MG/5ML PO SOLN: 325.0000 mg | ORAL | Status: DC | PRN

## 2018-08-28 MED ORDER — DOCUSATE SODIUM 100 MG PO CAPS: 100.0000 mg | ORAL_CAPSULE | Freq: Every day | ORAL | 2 refills | Status: AC | PRN

## 2018-08-28 MED ORDER — ACETAMINOPHEN 325 MG PO TABS: 650.0000 mg | ORAL_TABLET | Freq: Once | ORAL | Status: DC | PRN

## 2018-08-28 MED ORDER — ONDANSETRON HCL 4 MG/2ML IJ SOLN
INTRAMUSCULAR | Status: DC | PRN
  Administered 2018-08-28: 4 mg via INTRAVENOUS

## 2018-08-28 MED ORDER — LABETALOL HCL 5 MG/ML IV SOLN
INTRAVENOUS | Status: DC | PRN
  Administered 2018-08-28 (×3): 5 mg via INTRAVENOUS

## 2018-08-28 MED ORDER — ONDANSETRON HCL 4 MG/2ML IJ SOLN: 4.0000 mg | Freq: Once | INTRAMUSCULAR | Status: DC | PRN

## 2018-08-28 MED ORDER — PROPOFOL 10 MG/ML IV BOLUS
INTRAVENOUS | Status: DC | PRN
  Administered 2018-08-28: 150 mg via INTRAVENOUS

## 2018-08-28 MED ORDER — CLINDAMYCIN PHOSPHATE 900 MG/50ML IV SOLN
900.0000 mg | INTRAVENOUS | Status: AC
  Administered 2018-08-28: 12:00:00 900 mg via INTRAVENOUS

## 2018-08-28 MED ORDER — FENTANYL CITRATE (PF) 100 MCG/2ML IJ SOLN
INTRAMUSCULAR | Status: DC | PRN
  Administered 2018-08-28: 25 ug via INTRAVENOUS
  Administered 2018-08-28: 12.5 ug via INTRAVENOUS
  Administered 2018-08-28: 25 ug via INTRAVENOUS

## 2018-08-28 MED ORDER — BUPIVACAINE-EPINEPHRINE (PF) 0.5% -1:200000 IJ SOLN
INTRAMUSCULAR | Status: DC | PRN
  Administered 2018-08-28: 10 mL via PERINEURAL

## 2018-08-28 MED ORDER — LACTATED RINGERS IV SOLN
INTRAVENOUS | Status: DC | PRN
  Administered 2018-08-28: 2 mL

## 2018-08-28 MED ORDER — CHLORHEXIDINE GLUCONATE 4 % EX LIQD: 60.0000 mL | Freq: Once | CUTANEOUS | Status: DC

## 2018-08-28 MED ORDER — LACTATED RINGERS IV SOLN
INTRAVENOUS | Status: DC
  Administered 2018-08-28: 11:00:00 via INTRAVENOUS

## 2018-08-28 MED ORDER — LIDOCAINE HCL (CARDIAC) PF 100 MG/5ML IV SOSY
PREFILLED_SYRINGE | INTRAVENOUS | Status: DC | PRN
  Administered 2018-08-28: 30 mg via INTRATRACHEAL

## 2018-08-28 MED ORDER — GLYCOPYRROLATE 0.2 MG/ML IJ SOLN
INTRAMUSCULAR | Status: DC | PRN
  Administered 2018-08-28: 0.1 mg via INTRAVENOUS

## 2018-08-28 MED ORDER — MIDAZOLAM HCL 5 MG/5ML IJ SOLN
INTRAMUSCULAR | Status: DC | PRN
  Administered 2018-08-28 (×2): 1 mg via INTRAVENOUS

## 2018-08-28 MED ORDER — LACTATED RINGERS IV SOLN: INTRAVENOUS | Status: DC

## 2018-08-28 MED ORDER — HYDRALAZINE HCL 20 MG/ML IJ SOLN
10.0000 mg | Freq: Once | INTRAMUSCULAR | Status: AC
  Administered 2018-08-28: 10 mg via INTRAVENOUS

## 2018-08-28 SURGICAL SUPPLY — 39 items
ADAPTER IRRIG TUBE 2 SPIKE SOL (ADAPTER) ×6 IMPLANT
ADPR TBG 2 SPK PMP STRL ASCP (ADAPTER) ×2
APL PRP STRL LF DISP 70% ISPRP (MISCELLANEOUS) ×1
BLADE SHAVER 4.5 DBL SERAT CV (CUTTER) ×2 IMPLANT
BLADE SURG SZ11 CARB STEEL (BLADE) ×3 IMPLANT
BRUSH SCRUB EZ  4% CHG (MISCELLANEOUS) ×2
BRUSH SCRUB EZ 4% CHG (MISCELLANEOUS) ×1 IMPLANT
CHLORAPREP W/TINT 26 (MISCELLANEOUS) ×3 IMPLANT
COOLER POLAR GLACIER W/PUMP (MISCELLANEOUS) ×3 IMPLANT
COVER LIGHT HANDLE UNIVERSAL (MISCELLANEOUS) ×6 IMPLANT
DRAPE IMP U-DRAPE 54X76 (DRAPES) ×3 IMPLANT
GAUZE SPONGE 4X4 12PLY STRL (GAUZE/BANDAGES/DRESSINGS) ×3 IMPLANT
GAUZE XEROFORM 1X8 LF (GAUZE/BANDAGES/DRESSINGS) ×3 IMPLANT
GLOVE INDICATOR 8.0 STRL GRN (GLOVE) ×3 IMPLANT
GLOVE SURG ORTHO 8.0 STRL STRW (GLOVE) ×3 IMPLANT
GOWN STRL REUS W/ TWL LRG LVL3 (GOWN DISPOSABLE) ×1 IMPLANT
GOWN STRL REUS W/ TWL XL LVL3 (GOWN DISPOSABLE) ×1 IMPLANT
GOWN STRL REUS W/TWL LRG LVL3 (GOWN DISPOSABLE) ×3
GOWN STRL REUS W/TWL XL LVL3 (GOWN DISPOSABLE) ×3
IV LACTATED RINGER IRRG 3000ML (IV SOLUTION) ×6
IV LR IRRIG 3000ML ARTHROMATIC (IV SOLUTION) ×4 IMPLANT
KIT TURNOVER KIT A (KITS) ×3 IMPLANT
MANIFOLD NEPTUNE II (INSTRUMENTS) ×3 IMPLANT
MAT ABSORB  FLUID 56X50 GRAY (MISCELLANEOUS) ×2
MAT ABSORB FLUID 56X50 GRAY (MISCELLANEOUS) ×1 IMPLANT
NDL 18GX1X1/2 (RX/OR ONLY) (NEEDLE) ×1 IMPLANT
NDL HYPO TW 22X1.5 (NEEDLE) ×3 IMPLANT
NEEDLE 18GX1X1/2 (RX/OR ONLY) (NEEDLE) ×3 IMPLANT
PACK ARTHROSCOPY KNEE (MISCELLANEOUS) ×3 IMPLANT
PAD ABD DERMACEA PRESS 5X9 (GAUZE/BANDAGES/DRESSINGS) ×6 IMPLANT
PAD WRAPON POLAR KNEE (MISCELLANEOUS) ×1 IMPLANT
SCALPEL PROTECTED #11 DISP (BLADE) ×3 IMPLANT
SUT ETHILON 4-0 (SUTURE) ×3
SUT ETHILON 4-0 FS2 18XMFL BLK (SUTURE) ×1
SUTURE ETHLN 4-0 FS2 18XMF BLK (SUTURE) ×1 IMPLANT
SYR 10ML LL (SYRINGE) ×3 IMPLANT
TUBING ARTHRO INFLOW-ONLY STRL (TUBING) ×3 IMPLANT
WAND WEREWOLF FLOW 90D (MISCELLANEOUS) ×3 IMPLANT
WRAPON POLAR PAD KNEE (MISCELLANEOUS) ×3

## 2018-08-28 NOTE — Anesthesia Postprocedure Evaluation (Signed)
Anesthesia Post Note  Patient: Meagan Cox  Procedure(s) Performed: ARTHROSCOPY KNEE (Right Knee)  Patient location during evaluation: PACU Anesthesia Type: General Level of consciousness: awake and alert, oriented and patient cooperative Pain management: pain level controlled Vital Signs Assessment: post-procedure vital signs reviewed and stable Respiratory status: spontaneous breathing, nonlabored ventilation and respiratory function stable Cardiovascular status: blood pressure returned to baseline and stable Postop Assessment: adequate PO intake Anesthetic complications: no    Darrin Nipper

## 2018-08-28 NOTE — H&P (Signed)
The patient has been re-examined, and the chart reviewed, and there have been no interval changes to the documented history and physical.  Plan a right knee arthroscopy today.  Anesthesia is not consulted regarding a peripheral nerve block for post-operative pain.  The risks, benefits, and alternatives have been discussed at length, and the patient is willing to proceed.

## 2018-08-28 NOTE — Transfer of Care (Signed)
Immediate Anesthesia Transfer of Care Note  Patient: Meagan Cox  Procedure(s) Performed: ARTHROSCOPY KNEE (Right Knee)  Patient Location: PACU  Anesthesia Type: General  Level of Consciousness: awake, alert  and patient cooperative  Airway and Oxygen Therapy: Patient Spontanous Breathing and Patient connected to supplemental oxygen  Post-op Assessment: Post-op Vital signs reviewed, Patient's Cardiovascular Status Stable, Respiratory Function Stable, Patent Airway and No signs of Nausea or vomiting  Post-op Vital Signs: Reviewed and stable  Complications: No apparent anesthesia complications

## 2018-08-28 NOTE — Anesthesia Procedure Notes (Signed)
Procedure Name: LMA Insertion Date/Time: 08/28/2018 12:19 PM Performed by: Cameron Ali, CRNA Pre-anesthesia Checklist: Patient identified, Emergency Drugs available, Suction available, Timeout performed and Patient being monitored Patient Re-evaluated:Patient Re-evaluated prior to induction Oxygen Delivery Method: Circle system utilized Preoxygenation: Pre-oxygenation with 100% oxygen Induction Type: IV induction LMA: LMA inserted LMA Size: 3.0 Number of attempts: 1 Placement Confirmation: positive ETCO2 and breath sounds checked- equal and bilateral Tube secured with: Tape Dental Injury: Teeth and Oropharynx as per pre-operative assessment

## 2018-08-28 NOTE — Op Note (Signed)
  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 female with a diagnosis of [place] who failed conservative management and elected for surgical management.    The risks benefits and alternatives were discussed with the patient preoperatively including the risks of infection, bleeding, nerve injury, knee stiffness, persistent pain, osteoarthritis and the need for further surgery. Medical  risks include DVT and pulmonary embolism, myocardial infarction, stroke, pneumonia, respiratory failure and death. The patient understood these risks and wished to proceed.   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 The ACL showed partial tearing of less than 50% and the PCL was intact. There was mild lateral subluxation of the patella. No loose bodies were identified within the medial or lateral gutters.  OPERATIVE PROCEDURE: Patient was met in the preoperative area. The operative extremity was signed with my initials according the hospital's correct site of surgery protocol.  The patient was brought to the operating room where they was placed supine on the operative table. General anesthesia was administered. The patient was prepped and draped in a sterile fashion.  A timeout was performed to verify the patient's name, date of birth, medical record  number, correct site of surgery correct procedure to be performed. It was also used to verify the patient received antibiotics that all appropriate instruments, and radiographic studies were available in the room. Once all in attendance were in agreement, the case began.  Proposed arthroscopy incisions were drawn out with a surgical marker. These were pre-injected with 0.5% marcaine with epinephrine. An 11 blade was used to establish an inferior lateral and inferomedial portals. The inferomedial portal was created using a 18-gauge spinal needle under direct visualization.  A full diagnostic examination of the knee was performed including the suprapatellar pouch, patellofemoral joint, medial lateral compartments as well as the medial lateral gutters, the intercondylar notch in the posterior knee.  Patient had the medial and lateral meniscal tear treated with a 4-0 resector shaver blade and straight duckbill basket. The meniscus was debrided until a stable rim was achieved. A chondroplasty of the medial femoral condyle, lateral tibial plateau and trochlea and undersurface of patella was also performed using a 4-0 resector shaver blade. A partial synovectomy was also performed using a 4-0 resector shaver blade.  The knee was then copiously lavaged. All arthroscopic instruments were removed. The 2 arthroscopy portals were closed with 4-0 nylon. A dry sterile and compressive dressing was applied. The patient was brought to the PACU in stable condition. I was scrubbed and present for the entire case and all sharp and instrument counts were correct at the conclusion the case. I spoke with the patient's family postoperatively to let them know the case was performed without complication and the patient was stable in the recovery room.  Kurtis Bushman, MD

## 2018-08-28 NOTE — Discharge Instructions (Signed)
Post Op Home Instructions for Knee Arthroscopy ° °1) Do not sit for longer than 1 hour at a time with your leg dangling down.  You should have your legs elevated (higher than your heart) in a recliner chair or couch. ° °2) You may be up walking around as tolerated but should take periodic breaks to elevate your legs.  Discontinue use of crutches when you feel you are able to walk without pain or a limp. ° °3) Work on gentle bending and straightening of the knee. ° °4) You may remove the Ace wrap and dressings two days after surgery.  Place band aids over the incision sites. ° °5) You may shower after you remove the surgical dressing.  You do not need to cover the incision with plastic wrap.  The incision can get wet, but do not submerge under water.  After your sutures have been removed, you should wait 24 hours before submerging incision under water. ° °6) Pain medication can cause constipation.  You should increase your fluid intake, increase your intake of high fiber foods and/or take Metamucil as needed for constipation. ° °7) Continue your physical therapy exercises, as shown at the office, at least twice daily.  You should set up outpatient physical therapy and start within the first week after surgery. ° °8) Continue to use your Polar Pack continuously for 2-3 days after surgery.  After you remove the surgical dressing, it is a good idea to use your Polar Pack or ice pack for 30 minutes after doing your exercises to reduce swelling. ° °9) Do not be surprised if you have increased pain at night.  This usually means you have been a little too active during the day and need to reduce your activities. ° °10) If you develop lower extremity swelling that does not improve after a night of elevation, please call the office.  This could be an early sign of a blood clot. ° °Please call with any questions at [phone] ° ° °General Anesthesia, Adult, Care After °This sheet gives you information about how to care for  yourself after your procedure. Your health care provider may also give you more specific instructions. If you have problems or questions, contact your health care provider. °What can I expect after the procedure? °After the procedure, the following side effects are common: °· Pain or discomfort at the IV site. °· Nausea. °· Vomiting. °· Sore throat. °· Trouble concentrating. °· Feeling cold or chills. °· Weak or tired. °· Sleepiness and fatigue. °· Soreness and body aches. These side effects can affect parts of the body that were not involved in surgery. °Follow these instructions at home: ° °For at least 24 hours after the procedure: °· Have a responsible adult stay with you. It is important to have someone help care for you until you are awake and alert. °· Rest as needed. °· Do not: °? Participate in activities in which you could fall or become injured. °? Drive. °? Use heavy machinery. °? Drink alcohol. °? Take sleeping pills or medicines that cause drowsiness. °? Make important decisions or sign legal documents. °? Take care of children on your own. °Eating and drinking °· Follow any instructions from your health care provider about eating or drinking restrictions. °· When you feel hungry, start by eating small amounts of foods that are soft and easy to digest (bland), such as toast. Gradually return to your regular diet. °· Drink enough fluid to keep your urine pale yellow. °·   If you vomit, rehydrate by drinking water, juice, or clear broth. °General instructions °· If you have sleep apnea, surgery and certain medicines can increase your risk for breathing problems. Follow instructions from your health care provider about wearing your sleep device: °? Anytime you are sleeping, including during daytime naps. °? While taking prescription pain medicines, sleeping medicines, or medicines that make you drowsy. °· Return to your normal activities as told by your health care provider. Ask your health care provider  what activities are safe for you. °· Take over-the-counter and prescription medicines only as told by your health care provider. °· If you smoke, do not smoke without supervision. °· Keep all follow-up visits as told by your health care provider. This is important. °Contact a health care provider if: °· You have nausea or vomiting that does not get better with medicine. °· You cannot eat or drink without vomiting. °· You have pain that does not get better with medicine. °· You are unable to pass urine. °· You develop a skin rash. °· You have a fever. °· You have redness around your IV site that gets worse. °Get help right away if: °· You have difficulty breathing. °· You have chest pain. °· You have blood in your urine or stool, or you vomit blood. °Summary °· After the procedure, it is common to have a sore throat or nausea. It is also common to feel tired. °· Have a responsible adult stay with you for the first 24 hours after general anesthesia. It is important to have someone help care for you until you are awake and alert. °· When you feel hungry, start by eating small amounts of foods that are soft and easy to digest (bland), such as toast. Gradually return to your regular diet. °· Drink enough fluid to keep your urine pale yellow. °· Return to your normal activities as told by your health care provider. Ask your health care provider what activities are safe for you. °This information is not intended to replace advice given to you by your health care provider. Make sure you discuss any questions you have with your health care provider. °Document Released: 04/23/2000 Document Revised: 01/18/2017 Document Reviewed: 08/31/2016 °Elsevier Patient Education © 2020 [employer] ° °

## 2018-08-29 ENCOUNTER — Encounter: Payer: Self-pay | Admitting: Orthopedic Surgery

## 2018-09-01 ENCOUNTER — Other Ambulatory Visit: Payer: Self-pay

## 2018-09-04 DIAGNOSIS — S83231D Complex tear of medial meniscus, current injury, right knee, subsequent encounter: Secondary | ICD-10-CM | POA: Diagnosis not present

## 2018-09-05 DIAGNOSIS — M9902 Segmental and somatic dysfunction of thoracic region: Secondary | ICD-10-CM | POA: Diagnosis not present

## 2018-09-05 DIAGNOSIS — M5441 Lumbago with sciatica, right side: Secondary | ICD-10-CM | POA: Diagnosis not present

## 2018-09-05 DIAGNOSIS — M9903 Segmental and somatic dysfunction of lumbar region: Secondary | ICD-10-CM | POA: Diagnosis not present

## 2018-09-05 DIAGNOSIS — M5134 Other intervertebral disc degeneration, thoracic region: Secondary | ICD-10-CM | POA: Diagnosis not present

## 2018-09-22 ENCOUNTER — Other Ambulatory Visit: Payer: Self-pay

## 2018-09-22 MED ORDER — METOPROLOL SUCCINATE ER 25 MG PO TB24: 25.0000 mg | ORAL_TABLET | Freq: Every day | ORAL | 1 refills | Status: DC

## 2018-11-13 DIAGNOSIS — Z9889 Other specified postprocedural states: Secondary | ICD-10-CM | POA: Diagnosis not present

## 2018-11-13 DIAGNOSIS — M25561 Pain in right knee: Secondary | ICD-10-CM | POA: Diagnosis not present

## 2018-11-24 ENCOUNTER — Ambulatory Visit: Payer: PPO | Attending: Orthopedic Surgery

## 2018-11-24 ENCOUNTER — Other Ambulatory Visit: Payer: Self-pay

## 2018-11-24 ENCOUNTER — Encounter: Payer: Self-pay | Admitting: Physical Therapy

## 2018-11-24 DIAGNOSIS — M6281 Muscle weakness (generalized): Secondary | ICD-10-CM | POA: Insufficient documentation

## 2018-11-24 DIAGNOSIS — R2689 Other abnormalities of gait and mobility: Secondary | ICD-10-CM | POA: Diagnosis not present

## 2018-11-24 DIAGNOSIS — M25562 Pain in left knee: Secondary | ICD-10-CM | POA: Insufficient documentation

## 2018-11-24 DIAGNOSIS — M25561 Pain in right knee: Secondary | ICD-10-CM | POA: Insufficient documentation

## 2018-11-24 DIAGNOSIS — G8929 Other chronic pain: Secondary | ICD-10-CM | POA: Diagnosis not present

## 2018-11-24 NOTE — Therapy (Signed)
[facility] [address] [place], [place], [address] Phone: [phone]   Fax:  [phone]  Physical Therapy Evaluation  Patient Details  Name: Meagan Cox MRN: [id number] Date of Birth: 1940/02/22 Referring Provider (PT): Uvaldo Bristle MD   Encounter Date: 11/24/2018  PT End of Session - 11/24/18 1619    Visit Number  1    Number of Visits  8    Date for PT Re-Evaluation  12/22/18    PT Start Time  0900    PT Stop Time  0946    PT Time Calculation (min)  46 min    Activity Tolerance  Patient tolerated treatment well    Behavior During Therapy  [facility] for tasks assessed/performed       Past Medical History:  Diagnosis Date  . Hyperlipidemia   . Hypertension   . Hypothyroidism     Past Surgical History:  Procedure Laterality Date  . ABDOMINAL HYSTERECTOMY    . CARDIAC CATHETERIZATION     30 years ago, [place]  . CHOLECYSTECTOMY N/A 08/21/2015   Procedure: LAPAROSCOPIC CHOLECYSTECTOMY;  Surgeon: Jules Husbands, MD;  Location: [facility];  Service: General;  Laterality: N/A;  . EYE SURGERY     eye lid lift  . KNEE ARTHROSCOPY Right 08/28/2018   Procedure: ARTHROSCOPY KNEE;  Surgeon: Lovell Sheehan, MD;  Location: [facility];  Service: Orthopedics;  Laterality: Right;  . PARTIAL HYSTERECTOMY    . TONSILLECTOMY      There were no vitals filed for this visit.    Subjective Assessment - 11/24/18 1249    Subjective  Pt 3 months s/p R knee arthroscopy on 08/28/2018.  Pt reports no pain currently; no pain since corticosteroid shot 2 weeks ago.  Prior to shot pt reports pain at worst was 9/10.  Pt reports no falls since surgery.    Pertinent History  Pt is a 78 y.o. female 3 months s/p R knee arthoscopy on 08/28/2018. 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  The ACL showed partial tearing of less than 50% and the PCL was intact. There was mild lateral subluxation of the patella.  Pt is sedentary and reports she does not like PT and is only coming per doctor's request.  Pt received shot of cortisone 2 weeks ago and has had no pain since.  Swelling progressively worsens throughout day.  Pt states she is a frequent faller.  Pt is inactive and expresses reluctance to activity/ exercises.  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  NA    Patient Stated Goals  Reduce pain, strengthen hip IR, pain-free kneeling, getting up from floor    Currently in Pain?  No/denies        SUBJECTIVE Chief complaint: Weakness of R knee s/p arthroscopic surgery (for meniscal repair, partial ACL tear of less than 50% also noted) Onset: surgery on 08/28/2018 Referring Dx: R knee pain and weakness MD: Marica Otter Pain: 0/10 Present, 0/10 Best, 9/10 Worst Aggravating factors: bending knee, stationary bike, kneeling Easing factors: rest 24 hour pain behavior: Progressive throughout day Knee surgery: Yes Recent knee trauma: [place] years ago Prior history of knee injury or pain: Yes Radiating symptoms:  No Numbness/Tingling: No Pt Stated Goals: strengthen hip IR, kneeling position pain-free, getting up from floor  FOTO SCORE:  77 (age norm 39)   OBJECTIVE  MUSCULOSKELETAL: Tremor: Absent Bulk: Normal Tone: Normal, no spasticity, rigidity, or clonus No trophic changes noted to lower extremities. No ecchymosis, erythema, or edema noted around knee. No gross knee deformity noted  Posture No gross abnormalities noted in standing or seated posture  Lumbar/Hip AROM: WFL and painless   Gait No gross deficits in gait identified  Palpation Pain to palpation along R knee medial and lateral joint line of knee (medial  > lateral). No pain over patellar tendon. No pain with palpation to quadriceps or hamstrings.  Strength R/L 4/4- Hip flexion 5/5 Hip external rotation 4/5 Hip internal rotation 4*/5 Hip abduction seated  4+/5 Hip adduction seated 4+/5 Knee extension 4+/5 Knee flexion 5/5 Ankle Dorsiflexion 5/5 Ankle Plantarflexion *indicates pain  AROM  Knee R/L Flexion: 125*/130  Extension: -1/0 *indicates pain  Hip R/L Flexion: full Internal Rotation: full /limited External Rotation: limited/full *indicates pain    Muscle Length Hamstring length: L unlimited; R hamstring popliteal angle 45deg Hip Flexor length (modified Thomas): full   Passive Accessory Motion Deferred   NEUROLOGICAL:  Mental Status Patient is oriented to person, place and time.  Recent memory is intact.  Remote memory is intact.  Attention span and concentration are intact.  Expressive speech is intact.  Patient's fund of knowledge is within normal limits for educational level.  Sensation Deferred  Functional tests Squat quality: Pt with anterior translation, decreased hip hinge, knee valgus noted Single leg stance: assistance to achieve position: 3-4 seconds on L leg, 6-7 seconds on R leg Tandem stance: assistance to achieve position: moderate sway bilaterally, improved heel to toe positioning with R leg posteriorly  Special Tests Deferred  Objective measurements completed on examination: See above findings.    TREATMENT: Therapeutic Exercise: Supine heel slides - 2x10 Supine quad sets - 2x10 Supine bridge - 1x10  HEP: [id number]         PT Education - 11/24/18 1302    Education Details  Educated on [employer]) Educated  Patient    Methods  Explanation;Demonstration;Tactile cues;Verbal cues    Comprehension  Verbalized understanding;Returned demonstration          PT Long Term Goals - 11/24/18 1646      PT LONG TERM GOAL #1   Title  Pt will score at least 59 on FOTO  to indicated increased functional mobility.    Baseline  Pt FOTO score 41 (11/24/2018).    Time  4    Period  Weeks    Status  New    Target Date  12/22/18      PT LONG TERM GOAL #2   Title  Pt will be independent with HEP in order to decrease knee pain and increase strength in order to improve pain-free function at home.    Baseline  Pt administered beginner HEP for pain-free strengthening (11/24/2018).    Time  4    Period  Weeks    Status  New    Target Date  12/22/18      PT LONG TERM GOAL #3   Title  Pt will increase strength of hip flexion and hip IR by at least 1/2 MMT grade in order to demonstrate improvement in strength and function.    Baseline  Pt hip flexion R/L 4/4-, hip IR (R 4/5)    Time  4    Period  Weeks    Status  New    Target Date  12/22/18      PT LONG TERM GOAL #4   Title  Patient will report less than 3/10 on NPRS with functional, recreational, and at least 10 minutes of walking to indicate ability to perform functional activities with low levels of pain.    Baseline  Pain currentl 0/1o s/p injection, reassess in 1 month to ensure pain free status    Time  4    Period  Weeks    Status  New    Target Date  12/22/18      PT LONG TERM GOAL #5   Title  Pt will be able to perform SLS bilaterally for at least 10 seconds to demonstrate decreased risk of falls.    Baseline  R: 6-7 seconds, L: 3-4 seconds    Time  4    Period  Weeks    Status  New    Target Date  12/23/18             Plan - 11/24/18 1621    Clinical Impression Statement  Pt referred for R knee weakness and pain s/p knee arthroscopic surgery.  Pt examination reveals deficits in R LE strength, mobility, and balance.  Concerns with pt compliance, pt expressed reluctance to attend PT and do exercises at home.  Pt will benefit from PT services to address deficits in strength mobility, and pain in order to return to full function at home with less knee pain.    Personal Factors and  Comorbidities  Comorbidity 3+;Age;Behavior Pattern;Fitness;Past/Current Experience    Comorbidities  hyperlipidemia, HTN, hypothyroidism    Examination-Activity Limitations  Bend;Squat;Caring for Others;Stairs;Stand;Locomotion Level    Examination-Participation Restrictions  Church;School;Cleaning;Shop;Community Activity;Volunteer;Driving;Yard Work;Laundry;Meal Prep    Stability/Clinical Decision Making  Stable/Uncomplicated    Clinical Decision Making  Low    Rehab Potential  Good    PT Frequency  2x / week    PT Duration  4 weeks    PT Treatment/Interventions  ADLs/Self Care Home Management;Aquatic Therapy;Gait training;Traction;Moist Heat;Stair training;Functional mobility training;Therapeutic activities;Therapeutic exercise;Balance training;Neuromuscular re-education;Electrical Stimulation;Cryotherapy;Manual techniques;Patient/family education;Passive range of motion;Dry needling;Energy conservation;Ultrasound;Splinting;Taping;Joint Manipulations;Spinal Manipulations;Scar mobilization    PT Next Visit Plan  Progress LE strengthening (ACL restrictions), Berg, progress HEP    PT Home Exercise Plan  [email or web address]    Consulted and Agree with Plan of Care  Patient       Patient will benefit from skilled therapeutic intervention in order to improve the following deficits and impairments:  Abnormal gait, Pain, Improper body mechanics, Postural dysfunction, Other (comment), Decreased mobility, Decreased coordination, Decreased activity tolerance, Decreased endurance, Decreased balance, Decreased safety awareness, Impaired flexibility, Decreased range of motion, Obesity, Decreased strength, Cardiopulmonary status limiting activity, Difficulty walking, Decreased scar mobility, Hypomobility, Increased fascial restricitons  Visit Diagnosis: Chronic pain of right knee  Muscle weakness (generalized)  Other abnormalities of gait and mobility     Problem List Patient Active Problem List   Diagnosis Date  Noted  . Orthostasis 07/06/2018  . Travel advice encounter 10/19/2017  . Loss of perception for taste 10/19/2017  . Vertigo 10/19/2017  . Cystitis 03/05/2017  . Elevated blood pressure reading without diagnosis of hypertension 03/05/2017  . S/P laparoscopic cholecystectomy 03/05/2017  . Ptosis, both eyelids 01/04/2017  . Diarrhea in adult patient 01/04/2017  . Medicare annual wellness visit, subsequent 07/24/2015  . Cerebrovascular small vessel disease 04/24/2015  . Benign paroxysmal positional vertigo 04/24/2015  .  Right medial knee pain 04/24/2015  . Counseling for travel 01/11/2015  . Shoulder pain, right 11/22/2013  . Statin intolerance 10/08/2012  . Edema 05/14/2012  . Generalized anxiety disorder 01/06/2012  . 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 Hypothyroidism 12/18/2010  . Low back pain radiating to both legs 12/18/2010  . Hip pain, right 12/18/2010  . Obesity 05/09/2010  . CAD (coronary artery disease) 05/09/2010  . Hyperlipidemia 05/09/2010  . Hypertension 05/09/2010    Chinita Greenland, SPT  [facility] [address] [place], [place], [address] Phone: [phone]   Fax:  [phone]  Name: Meagan Cox MRN: [id number] Date of Birth: [birth year]

## 2018-11-24 NOTE — Patient Instructions (Signed)
Access Code: [email or web address]  URL: [email or web address]  Date: 11/24/2018  Prepared by: Lieutenant Diego   Exercises  Supine Bridge - 15 reps - 1 sets - 1x daily - 7x weekly  Supine Heel Slides - 10 reps - 2 sets - 1x daily - 7x weekly  Quad Set - 10 reps - 2 sets - 1x daily - 7x weekly

## 2018-12-01 ENCOUNTER — Ambulatory Visit: Payer: PPO | Attending: Orthopedic Surgery | Admitting: Physical Therapy

## 2018-12-01 ENCOUNTER — Encounter: Payer: Self-pay | Admitting: Physical Therapy

## 2018-12-01 ENCOUNTER — Other Ambulatory Visit: Payer: Self-pay

## 2018-12-01 DIAGNOSIS — M2170 Unequal limb length (acquired), unspecified site: Secondary | ICD-10-CM | POA: Diagnosis not present

## 2018-12-01 DIAGNOSIS — M25551 Pain in right hip: Secondary | ICD-10-CM | POA: Insufficient documentation

## 2018-12-01 DIAGNOSIS — M25561 Pain in right knee: Secondary | ICD-10-CM | POA: Insufficient documentation

## 2018-12-01 DIAGNOSIS — R2689 Other abnormalities of gait and mobility: Secondary | ICD-10-CM | POA: Diagnosis not present

## 2018-12-01 DIAGNOSIS — G8929 Other chronic pain: Secondary | ICD-10-CM | POA: Diagnosis not present

## 2018-12-01 DIAGNOSIS — R293 Abnormal posture: Secondary | ICD-10-CM | POA: Insufficient documentation

## 2018-12-01 DIAGNOSIS — M6281 Muscle weakness (generalized): Secondary | ICD-10-CM | POA: Insufficient documentation

## 2018-12-01 DIAGNOSIS — M791 Myalgia, unspecified site: Secondary | ICD-10-CM | POA: Insufficient documentation

## 2018-12-01 NOTE — Therapy (Signed)
[facility] [address] [place], [place], [address] Phone: [phone]   Fax:  [phone]  Physical Therapy Treatment  Patient Details  Name: Meagan Cox MRN: [id number] Date of Birth: 09-Aug-1940 Referring Provider (PT): Uvaldo Bristle MD   Encounter Date: 12/01/2018  PT End of Session - 12/01/18 0906    Visit Number  2    Number of Visits  8    Date for PT Re-Evaluation  12/22/18    PT Start Time  0901    PT Stop Time  0947    PT Time Calculation (min)  46 min    Activity Tolerance  Patient tolerated treatment well    Behavior During Therapy  [facility] for tasks assessed/performed       Past Medical History:  Diagnosis Date  . Hyperlipidemia   . Hypertension   . Hypothyroidism     Past Surgical History:  Procedure Laterality Date  . ABDOMINAL HYSTERECTOMY    . CARDIAC CATHETERIZATION     30 years ago, [place]  . CHOLECYSTECTOMY N/A 08/21/2015   Procedure: LAPAROSCOPIC CHOLECYSTECTOMY;  Surgeon: Jules Husbands, MD;  Location: [facility];  Service: General;  Laterality: N/A;  . EYE SURGERY     eye lid lift  . KNEE ARTHROSCOPY Right 08/28/2018   Procedure: ARTHROSCOPY KNEE;  Surgeon: Lovell Sheehan, MD;  Location: [place];  Service: Orthopedics;  Laterality: Right;  . PARTIAL HYSTERECTOMY    . TONSILLECTOMY      There were no vitals filed for this visit.  Subjective Assessment - 12/01/18 0857    Subjective  Pt. reports no pain in R knee and reviewed history with PT.  Pt. reports she was able to complete HEP.  Pt. states she hopes to return to traveling next year after Covid.    Pertinent History  Pt is a 78 y.o. female 3 months s/p R knee arthoscopy on 08/28/2018. 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 The ACL showed partial tearing of less than 50%  and the PCL was intact. There was mild lateral subluxation of the patella.  Pt is sedentary and reports she does not like PT and is only coming per doctor's request.  Pt received shot of cortisone 2 weeks ago and has had no pain since.  Swelling progressively worsens throughout day.  Pt states she is a frequent faller.  Pt is inactive and expresses reluctance to activity/ exercises.  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  NA    Patient Stated Goals  Reduce pain, strengthen hip IR, pain-free kneeling, getting up from floor    Currently in Pain?  No/denies         There.ex.:  Standing hip ex.: marching in //-bars/ hip abduction/ extension 10x2.   Supine SAQ/ quad sets/ SLR with light PT assist 10x2 each.  Bolster bridging 10x2.  Reviewed HEP  Supine hamstring/ hip stretches (generalized)- R prox. Tibia discomfort with knee flexion (patella tracking issues)  Walking in //-bars (high marching/ lateral walking)- min. To no UE assist.    Nustep L5 B UE/LE 5 min. (tired and done with bike)- pt. States she is not a big fan of exercise but  understands she has knee/ quad instability.       PT Long Term Goals - 11/24/18 1646      PT LONG TERM GOAL #1   Title  Pt will score at least 59 on FOTO to indicated increased functional mobility.    Baseline  Pt FOTO score 41 (11/24/2018).    Time  4    Period  Weeks    Status  New    Target Date  12/22/18      PT LONG TERM GOAL #2   Title  Pt will be independent with HEP in order to decrease knee pain and increase strength in order to improve pain-free function at home.    Baseline  Pt administered beginner HEP for pain-free strengthening (11/24/2018).    Time  4    Period  Weeks    Status  New    Target Date  12/22/18      PT LONG TERM GOAL #3   Title  Pt will increase strength  of hip flexion and hip IR by at least 1/2 MMT grade in order to demonstrate improvement in strength and function.    Baseline  Pt hip flexion R/L 4/4-, hip IR (R 4/5)    Time  4    Period  Weeks    Status  New    Target Date  12/22/18      PT LONG TERM GOAL #4   Title  Patient will report less than 3/10 on NPRS with functional, recreational, and at least 10 minutes of walking to indicate ability to perform functional activities with low levels of pain.    Baseline  Pain currentl 0/1o s/p injection, reassess in 1 month to ensure pain free status    Time  4    Period  Weeks    Status  New    Target Date  12/22/18      PT LONG TERM GOAL #5   Title  Pt will be able to perform SLS bilaterally for at least 10 seconds to demonstrate decreased risk of falls.    Baseline  R: 6-7 seconds, L: 3-4 seconds    Time  4    Period  Weeks    Status  New    Target Date  12/23/18            Plan - 12/01/18 1623    Clinical Impression Statement  Significant R knee genu valgus noted with all standing/ walking.  LLD noted and PT discussed lift but pt. prefers to walking around barefoot at home.  Pt. easily fatigue with ther.ex./ Nustep and rest breaks required.  No increase c/o knee pain but muscle weakness noted after tx.  No change to HEP at this time.    Personal Factors and Comorbidities  Comorbidity 3+;Age;Behavior Pattern;Fitness;Past/Current Experience    Comorbidities  hyperlipidemia, HTN, hypothyroidism    Examination-Activity Limitations  Bend;Squat;Caring for Others;Stairs;Stand;Locomotion Level    Examination-Participation Restrictions  Church;School;Cleaning;Shop;Community Activity;Volunteer;Driving;Yard Work;Laundry;Meal Prep    Stability/Clinical Decision Making  Stable/Uncomplicated    Clinical Decision Making  Moderate    Rehab Potential  Good    PT Frequency  2x / week    PT Duration  4 weeks    PT Treatment/Interventions  ADLs/Self Care Home Management;Aquatic Therapy;Gait  training;Traction;Moist Heat;Stair training;Functional mobility training;Therapeutic activities;Therapeutic exercise;Balance training;Neuromuscular re-education;Electrical Stimulation;Cryotherapy;Manual techniques;Patient/family education;Passive range of motion;Dry needling;Energy conservation;Ultrasound;Splinting;Taping;Joint Manipulations;Spinal Manipulations;Scar mobilization    PT Next Visit Plan  Progress LE strengthening (ACL restrictions), BERG next tx./ PROGRESS  HEP    PT Home Exercise Plan  [id number]    Consulted and Agree with Plan of Care  Patient       Patient will benefit from skilled therapeutic intervention in order to improve the following deficits and impairments:  Abnormal gait, Pain, Improper body mechanics, Postural dysfunction, Other (comment), Decreased mobility, Decreased coordination, Decreased activity tolerance, Decreased endurance, Decreased balance, Decreased safety awareness, Impaired flexibility, Decreased range of motion, Obesity, Decreased strength, Cardiopulmonary status limiting activity, Difficulty walking, Decreased scar mobility, Hypomobility, Increased fascial restricitons  Visit Diagnosis: Chronic pain of right knee  Muscle weakness (generalized)  Other abnormalities of gait and mobility  Myalgia  Leg length difference, acquired     Problem List Patient Active Problem List   Diagnosis Date Noted  . Orthostasis 07/06/2018  . Travel advice encounter 10/19/2017  . Loss of perception for taste 10/19/2017  . Vertigo 10/19/2017  . Cystitis 03/05/2017  . Elevated blood pressure reading without diagnosis of hypertension 03/05/2017  . S/P laparoscopic cholecystectomy 03/05/2017  . Ptosis, both eyelids 01/04/2017  . Diarrhea in adult patient 01/04/2017  . Medicare annual wellness visit, subsequent 07/24/2015  . Cerebrovascular small vessel disease 04/24/2015  . Benign paroxysmal positional vertigo 04/24/2015  . Right medial knee pain 04/24/2015  .  Counseling for travel 01/11/2015  . Shoulder pain, right 11/22/2013  . Statin intolerance 10/08/2012  . Edema 05/14/2012  . Generalized anxiety disorder 01/06/2012  . 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 Hypothyroidism 12/18/2010  . Low back pain radiating to both legs 12/18/2010  . Hip pain, right 12/18/2010  . Obesity 05/09/2010  . CAD (coronary artery disease) 05/09/2010  . Hyperlipidemia 05/09/2010  . Hypertension 05/09/2010   Pura Spice, PT, DPT # [phone] 12/01/2018, 4:26 PM  [facility] [facility] [facility] [address] [place], [place], [address] Phone: [phone]   Fax:  [phone]  Name: Carmellia Merlin MRN: [id number] Date of Birth: 17-May-1940

## 2018-12-03 ENCOUNTER — Other Ambulatory Visit: Payer: Self-pay

## 2018-12-03 ENCOUNTER — Ambulatory Visit: Payer: PPO | Admitting: Physical Therapy

## 2018-12-03 ENCOUNTER — Encounter: Payer: Self-pay | Admitting: Physical Therapy

## 2018-12-03 DIAGNOSIS — M25561 Pain in right knee: Secondary | ICD-10-CM | POA: Diagnosis not present

## 2018-12-03 DIAGNOSIS — M791 Myalgia, unspecified site: Secondary | ICD-10-CM

## 2018-12-03 DIAGNOSIS — M6281 Muscle weakness (generalized): Secondary | ICD-10-CM

## 2018-12-03 DIAGNOSIS — R293 Abnormal posture: Secondary | ICD-10-CM

## 2018-12-03 DIAGNOSIS — G8929 Other chronic pain: Secondary | ICD-10-CM

## 2018-12-03 DIAGNOSIS — M25551 Pain in right hip: Secondary | ICD-10-CM

## 2018-12-03 DIAGNOSIS — R2689 Other abnormalities of gait and mobility: Secondary | ICD-10-CM

## 2018-12-03 DIAGNOSIS — M2170 Unequal limb length (acquired), unspecified site: Secondary | ICD-10-CM

## 2018-12-03 NOTE — Therapy (Signed)
[facility] [address]. [place], [place], [address] Phone: [phone]   Fax:  [phone]  Physical Therapy Treatment  Patient Details  Name: Meagan Cox MRN: [id number] Date of Birth: [birth year] Referring Provider (PT): Uvaldo Bristle MD   Encounter Date: 12/03/2018  PT End of Session - 12/03/18 0900    Visit Number  3    Number of Visits  8    Date for PT Re-Evaluation  12/22/18    Authorization - Visit Number  3    Authorization - Number of Visits  10    PT Start Time  [id number]    PT Stop Time  0940    PT Time Calculation (min)  45 min    Activity Tolerance  Patient tolerated treatment well    Behavior During Therapy  [facility] for tasks assessed/performed       Past Medical History:  Diagnosis Date  . Hyperlipidemia   . Hypertension   . Hypothyroidism     Past Surgical History:  Procedure Laterality Date  . ABDOMINAL HYSTERECTOMY    . CARDIAC CATHETERIZATION     30 years ago, [place]  . CHOLECYSTECTOMY N/A 08/21/2015   Procedure: LAPAROSCOPIC CHOLECYSTECTOMY;  Surgeon: Jules Husbands, MD;  Location: [facility];  Service: General;  Laterality: N/A;  . EYE SURGERY     eye lid lift  . KNEE ARTHROSCOPY Right 08/28/2018   Procedure: ARTHROSCOPY KNEE;  Surgeon: Lovell Sheehan, MD;  Location: [place];  Service: Orthopedics;  Laterality: Right;  . PARTIAL HYSTERECTOMY    . TONSILLECTOMY      There were no vitals filed for this visit.  Subjective Assessment - 12/03/18 0859    Subjective  Pt. states she stays active with yard/ housework all the time.  Pt. is not a fan of doing exercise.    Pertinent History  Pt is a 78 y.o. female 3 months s/p R knee arthoscopy on 08/28/2018. 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 The ACL showed partial tearing of less  than 50% and the PCL was intact. There was mild lateral subluxation of the patella.  Pt is sedentary and reports she does not like PT and is only coming per doctor's request.  Pt received shot of cortisone 2 weeks ago and has had no pain since.  Swelling progressively worsens throughout day.  Pt states she is a frequent faller.  Pt is inactive and expresses reluctance to activity/ exercises.  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  NA    Patient Stated Goals  Reduce pain, strengthen hip IR, pain-free kneeling, getting up from floor    Currently in Pain?  No/denies         There.ex.:  Nustep L5 B LE only 6 min. (tired and done with bike).  Standing hip ex. (4#): marching in //-bars/ hip abduction/ extension/ knee flexion/ heel raises 20x. Seated marching/ LAQ with 4# 20x.  Walking in //-bars (forward/ backwards/ high marching/ lateral walking)- 3x each with mirror feedback (min. To no UE assist).  Supine SAQ/ quad sets/ SLR with light PT assist 10x2 each.  Bolster bridging 10x2.  Supine R hip/knee manual isometrics 5x 5 sec  holds (no increase pain).  Supine hamstring/ hip stretches (generalized)-  Increase discomfort with end-range R piriformis stretches (modified).       Cuing/ mirror feedback during all standing/ walking tasks to maintain R knee/foot position.    PT Long Term Goals - 11/24/18 1646      PT LONG TERM GOAL #1   Title  Pt will score at least 59 on FOTO to indicated increased functional mobility.    Baseline  Pt FOTO score 41 (11/24/2018).    Time  4    Period  Weeks    Status  New    Target Date  12/22/18      PT LONG TERM GOAL #2   Title  Pt will be independent with HEP in order to decrease knee pain and increase strength in order to improve pain-free function at home.    Baseline  Pt administered  beginner HEP for pain-free strengthening (11/24/2018).    Time  4    Period  Weeks    Status  New    Target Date  12/22/18      PT LONG TERM GOAL #3   Title  Pt will increase strength of hip flexion and hip IR by at least 1/2 MMT grade in order to demonstrate improvement in strength and function.    Baseline  Pt hip flexion R/L 4/4-, hip IR (R 4/5)    Time  4    Period  Weeks    Status  New    Target Date  12/22/18      PT LONG TERM GOAL #4   Title  Patient will report less than 3/10 on NPRS with functional, recreational, and at least 10 minutes of walking to indicate ability to perform functional activities with low levels of pain.    Baseline  Pain currentl 0/1o s/p injection, reassess in 1 month to ensure pain free status    Time  4    Period  Weeks    Status  New    Target Date  12/22/18      PT LONG TERM GOAL #5   Title  Pt will be able to perform SLS bilaterally for at least 10 seconds to demonstrate decreased risk of falls.    Baseline  R: 6-7 seconds, L: 3-4 seconds    Time  4    Period  Weeks    Status  New    Target Date  12/23/18            Plan - 12/03/18 0901    Clinical Impression Statement  Pt. requires tactile cuing to maintain R knee/ foot in midline position during Nustep/ squats.  Pt. worked hard during tx. session and completed all standing/ seated ther.ex with no increase c/o R knee pain.  R hip flexor discomfort with high marching and supine knee to chest AROM.  PT encouraged pt. to continue with HEP and be aware of R knee/foot position while completing household chores/ yard work.    Personal Factors and Comorbidities  Comorbidity 3+;Age;Behavior Pattern;Fitness;Past/Current Experience    Comorbidities  hyperlipidemia, HTN, hypothyroidism    Examination-Activity Limitations  Bend;Squat;Caring for Others;Stairs;Stand;Locomotion Level    Examination-Participation Restrictions  Church;School;Cleaning;Shop;Community Activity;Volunteer;Driving;Yard  Work;Laundry;Meal Prep    Stability/Clinical Decision Making  Stable/Uncomplicated    Clinical Decision Making  Low    Rehab Potential  Good    PT Frequency  2x / week    PT Duration  4 weeks    PT Treatment/Interventions  ADLs/Self Care Home Management;Aquatic Therapy;Gait training;Traction;Moist Heat;Stair training;Functional mobility training;Therapeutic activities;Therapeutic exercise;Balance training;Neuromuscular re-education;Electrical Stimulation;Cryotherapy;Manual techniques;Patient/family education;Passive range of motion;Dry needling;Energy conservation;Ultrasound;Splinting;Taping;Joint Manipulations;Spinal Manipulations;Scar mobilization    PT Next Visit Plan  Progress LE strengthening (ACL restrictions), BERG next tx./ PROGRESS HEP    PT Home Exercise Plan  [id number]    Consulted and Agree with Plan of Care  Patient       Patient will benefit from skilled therapeutic intervention in order to improve the following deficits and impairments:  Abnormal gait, Pain, Improper body mechanics, Postural dysfunction, Other (comment), Decreased mobility, Decreased coordination, Decreased activity tolerance, Decreased endurance, Decreased balance, Decreased safety awareness, Impaired flexibility, Decreased range of motion, Obesity, Decreased strength, Cardiopulmonary status limiting activity, Difficulty walking, Decreased scar mobility, Hypomobility, Increased fascial restricitons  Visit Diagnosis: Chronic pain of right knee  Muscle weakness (generalized)  Other abnormalities of gait and mobility  Myalgia  Leg length difference, acquired  Pain in right hip  Poor posture     Problem List Patient Active Problem List   Diagnosis Date Noted  . Orthostasis 07/06/2018  . Travel advice encounter 10/19/2017  . Loss of perception for taste 10/19/2017  . Vertigo 10/19/2017  . Cystitis 03/05/2017  . Elevated blood pressure reading without diagnosis of hypertension 03/05/2017  . S/P  laparoscopic cholecystectomy 03/05/2017  . Ptosis, both eyelids 01/04/2017  . Diarrhea in adult patient 01/04/2017  . Medicare annual wellness visit, subsequent 07/24/2015  . Cerebrovascular small vessel disease 04/24/2015  . Benign paroxysmal positional vertigo 04/24/2015  . Right medial knee pain 04/24/2015  . Counseling for travel 01/11/2015  . Shoulder pain, right 11/22/2013  . Statin intolerance 10/08/2012  . Edema 05/14/2012  . Generalized anxiety disorder 01/06/2012  . 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 Hypothyroidism 12/18/2010  . Low back pain radiating to both legs 12/18/2010  . Hip pain, right 12/18/2010  . Obesity 05/09/2010  . CAD (coronary artery disease) 05/09/2010  . Hyperlipidemia 05/09/2010  . Hypertension 05/09/2010   Pura Spice, PT, DPT # [phone] 12/03/2018, 9:46 AM  [facility] [address] [place], [place], [address] Phone: [phone]   Fax:  [phone]  Name: Meagan Cox MRN: [id number] Date of Birth: October 12, 1940

## 2018-12-08 ENCOUNTER — Ambulatory Visit: Payer: PPO | Admitting: Physical Therapy

## 2018-12-08 ENCOUNTER — Other Ambulatory Visit: Payer: Self-pay

## 2018-12-10 ENCOUNTER — Other Ambulatory Visit: Payer: Self-pay

## 2018-12-10 ENCOUNTER — Encounter: Payer: Self-pay | Admitting: Physical Therapy

## 2018-12-10 ENCOUNTER — Ambulatory Visit: Payer: PPO | Admitting: Physical Therapy

## 2018-12-10 DIAGNOSIS — M791 Myalgia, unspecified site: Secondary | ICD-10-CM

## 2018-12-10 DIAGNOSIS — M2170 Unequal limb length (acquired), unspecified site: Secondary | ICD-10-CM

## 2018-12-10 DIAGNOSIS — M6281 Muscle weakness (generalized): Secondary | ICD-10-CM

## 2018-12-10 DIAGNOSIS — M25561 Pain in right knee: Secondary | ICD-10-CM | POA: Diagnosis not present

## 2018-12-10 DIAGNOSIS — G8929 Other chronic pain: Secondary | ICD-10-CM

## 2018-12-10 DIAGNOSIS — M25551 Pain in right hip: Secondary | ICD-10-CM

## 2018-12-10 DIAGNOSIS — R293 Abnormal posture: Secondary | ICD-10-CM

## 2018-12-10 DIAGNOSIS — R2689 Other abnormalities of gait and mobility: Secondary | ICD-10-CM

## 2018-12-10 NOTE — Therapy (Signed)
[facility] [address] [place], [place], [address] Phone: [phone]   Fax:  [phone]  Physical Therapy Treatment  Patient Details  Name: Meagan Cox MRN: [id number] Date of Birth: 05-Feb-1940 Referring Provider (PT): Uvaldo Bristle MD   Encounter Date: 12/10/2018  PT End of Session - 12/11/18 1919    Visit Number  4    Number of Visits  8    Date for PT Re-Evaluation  12/22/18    Authorization - Visit Number  4    Authorization - Number of Visits  10    PT Start Time  0901    PT Stop Time  0946    PT Time Calculation (min)  45 min    Activity Tolerance  Patient tolerated treatment well    Behavior During Therapy  [facility] for tasks assessed/performed       Past Medical History:  Diagnosis Date  . Hyperlipidemia   . Hypertension   . Hypothyroidism     Past Surgical History:  Procedure Laterality Date  . ABDOMINAL HYSTERECTOMY    . CARDIAC CATHETERIZATION     30 years ago, [place]  . CHOLECYSTECTOMY N/A 08/21/2015   Procedure: LAPAROSCOPIC CHOLECYSTECTOMY;  Surgeon: Jules Husbands, MD;  Location: [facility];  Service: General;  Laterality: N/A;  . EYE SURGERY     eye lid lift  . KNEE ARTHROSCOPY Right 08/28/2018   Procedure: ARTHROSCOPY KNEE;  Surgeon: Lovell Sheehan, MD;  Location: [place];  Service: Orthopedics;  Laterality: Right;  . PARTIAL HYSTERECTOMY    . TONSILLECTOMY      There were no vitals filed for this visit.     There.ex.:  Walking in //-bars (forward/ backwards/ high marching/ lateral walking)- 3x each with mirror feedback (min. To no UE assist).  Supine SAQ/ quad sets/ SLR with light PT assist 10x2 each. Bolster bridging 10x2. Supine R hip/knee manual isometrics 5x 5 sec holds (no increase pain).  Nustep L5 B LE only 7 min  Supine hamstring/ hip stretches(generalized)-  Increase discomfort with end-range R piriformis stretches (modified)- 3x each.     Standing hip  ex. (4#): marching in //-bars/ hip abduction/ extension/ knee flexion/ heel raises 20x.Seated marching/ LAQ with 4# 20x.  Neuro:  Merrilee Jansky balance test:  54/56  (did well with 2 out of 4 on SLS)     PT Long Term Goals - 11/24/18 1646      PT LONG TERM GOAL #1   Title  Pt will score at least 59 on FOTO to indicated increased functional mobility.    Baseline  Pt FOTO score 41 (11/24/2018).    Time  4    Period  Weeks    Status  New    Target Date  12/22/18      PT LONG TERM GOAL #2   Title  Pt will be independent with HEP in order to decrease knee pain and increase strength in order to improve pain-free function at home.    Baseline  Pt administered beginner HEP for pain-free strengthening (11/24/2018).    Time  4    Period  Weeks    Status  New    Target Date  12/22/18      PT LONG TERM GOAL #3   Title  Pt will increase strength of hip flexion and hip IR by at least 1/2 MMT grade in order to demonstrate improvement in strength and function.    Baseline  Pt hip flexion R/L 4/4-, hip IR (R 4/5)    Time  4    Period  Weeks    Status  New    Target Date  12/22/18      PT LONG TERM GOAL #4   Title  Patient will report less than 3/10 on NPRS with functional, recreational, and at least 10 minutes of walking to indicate ability to perform functional activities with low levels of pain.    Baseline  Pain currentl 0/1o s/p injection, reassess in 1 month to ensure pain free status    Time  4    Period  Weeks    Status  New    Target Date  12/22/18      PT LONG TERM GOAL #5   Title  Pt will be able to perform SLS bilaterally for at least 10 seconds to demonstrate decreased risk of falls.    Baseline  R: 6-7 seconds, L: 3-4 seconds    Time  4    Period  Weeks    Status  New    Target Date  12/23/18            Plan - 12/11/18 1920    Clinical Impression Statement  Pt. did excellent with Berg balance test and scored 54/56.  Pt. able to complete all tasks well but limited with  SLS on L/R.  Pt. progressing with LE strengthening ex. program with less UE assist in //-bars during resisted ex.  Pt. fatigues easily and benefits from moderate cuing t/o tx. session.  No c/o R knee pain towards end of tx. at 1st step.  Pt. encouraged to work on HEP on a consistent basis to improve progress.    Personal Factors and Comorbidities  Comorbidity 3+;Age;Behavior Pattern;Fitness;Past/Current Experience    Comorbidities  hyperlipidemia, HTN, hypothyroidism    Examination-Activity Limitations  Bend;Squat;Caring for Others;Stairs;Stand;Locomotion Level    Examination-Participation Restrictions  Church;School;Cleaning;Shop;Community Activity;Volunteer;Driving;Yard Work;Laundry;Meal Prep    Stability/Clinical Decision Making  Stable/Uncomplicated    Clinical Decision Making  Low    Rehab Potential  Good    PT Frequency  2x / week    PT Duration  4 weeks    PT Treatment/Interventions  ADLs/Self Care Home Management;Aquatic Therapy;Gait training;Traction;Moist Heat;Stair training;Functional mobility training;Therapeutic activities;Therapeutic exercise;Balance training;Neuromuscular re-education;Electrical Stimulation;Cryotherapy;Manual techniques;Patient/family education;Passive range of motion;Dry needling;Energy conservation;Ultrasound;Splinting;Taping;Joint Manipulations;Spinal Manipulations;Scar mobilization    PT Next Visit Plan  Progress LE strengthening (ACL restrictions).    PT Home Exercise Plan  [id number]    Consulted and Agree with Plan of Care  Patient       Patient will benefit from skilled therapeutic intervention in order to improve the following deficits and impairments:  Abnormal gait, Pain, Improper body mechanics, Postural dysfunction, Other (comment), Decreased mobility, Decreased coordination, Decreased activity tolerance, Decreased endurance, Decreased balance, Decreased safety awareness, Impaired flexibility, Decreased range of motion, Obesity, Decreased strength,  Cardiopulmonary status limiting activity, Difficulty walking, Decreased scar mobility, Hypomobility, Increased fascial restricitons  Visit Diagnosis: Chronic pain of right knee  Muscle weakness (generalized)  Other abnormalities of gait and mobility  Myalgia  Leg length difference, acquired  Pain in right hip  Poor posture     Problem List Patient Active Problem List   Diagnosis Date Noted  . Orthostasis 07/06/2018  . Travel advice encounter 10/19/2017  . Loss of perception for taste 10/19/2017  . Vertigo 10/19/2017  . Cystitis 03/05/2017  . Elevated blood pressure reading without diagnosis of hypertension 03/05/2017  . S/P laparoscopic cholecystectomy  03/05/2017  . Ptosis, both eyelids 01/04/2017  . Diarrhea in adult patient 01/04/2017  . Medicare annual wellness visit, subsequent 07/24/2015  . Cerebrovascular small vessel disease 04/24/2015  . Benign paroxysmal positional vertigo 04/24/2015  . Right medial knee pain 04/24/2015  . Counseling for travel 01/11/2015  . Shoulder pain, right 11/22/2013  . Statin intolerance 10/08/2012  . Edema 05/14/2012  . Generalized anxiety disorder 01/06/2012  . 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 Hypothyroidism 12/18/2010  . Low back pain radiating to both legs 12/18/2010  . Hip pain, right 12/18/2010  . Obesity 05/09/2010  . CAD (coronary artery disease) 05/09/2010  . Hyperlipidemia 05/09/2010  . Hypertension 05/09/2010   Pura Spice, PT, DPT # [id number] 12/11/2018, 7:26 PM  [facility] [address] [place], [place], [address] Phone: [phone]   Fax:  [phone]  Name: Meagan Cox MRN: [id number] Date of Birth: [birth year]

## 2018-12-15 ENCOUNTER — Ambulatory Visit: Payer: PPO | Admitting: Physical Therapy

## 2018-12-15 ENCOUNTER — Encounter: Payer: Self-pay | Admitting: Physical Therapy

## 2018-12-15 ENCOUNTER — Other Ambulatory Visit: Payer: Self-pay

## 2018-12-15 DIAGNOSIS — M791 Myalgia, unspecified site: Secondary | ICD-10-CM

## 2018-12-15 DIAGNOSIS — M25561 Pain in right knee: Secondary | ICD-10-CM

## 2018-12-15 DIAGNOSIS — R2689 Other abnormalities of gait and mobility: Secondary | ICD-10-CM

## 2018-12-15 DIAGNOSIS — M6281 Muscle weakness (generalized): Secondary | ICD-10-CM

## 2018-12-15 DIAGNOSIS — M2170 Unequal limb length (acquired), unspecified site: Secondary | ICD-10-CM

## 2018-12-15 DIAGNOSIS — G8929 Other chronic pain: Secondary | ICD-10-CM

## 2018-12-15 NOTE — Therapy (Signed)
[facility] [address] [place], [place], [address] Phone: [phone]   Fax:  [phone]  Physical Therapy Treatment  Patient Details  Name: Meagan Cox MRN: [id number] Date of Birth: Feb 24, 1940 Referring Provider (PT): Uvaldo Bristle MD   Encounter Date: 12/15/2018  PT End of Session - 12/15/18 1932    Visit Number  5    Number of Visits  8    Date for PT Re-Evaluation  12/22/18    Authorization - Visit Number  5    Authorization - Number of Visits  10    PT Start Time  [phone]    PT Stop Time  1025    PT Time Calculation (min)  47 min    Activity Tolerance  Patient tolerated treatment well    Behavior During Therapy  [facility] for tasks assessed/performed       Past Medical History:  Diagnosis Date  . Hyperlipidemia   . Hypertension   . Hypothyroidism     Past Surgical History:  Procedure Laterality Date  . ABDOMINAL HYSTERECTOMY    . CARDIAC CATHETERIZATION     30 years ago, [place]  . CHOLECYSTECTOMY N/A 08/21/2015   Procedure: LAPAROSCOPIC CHOLECYSTECTOMY;  Surgeon: Jules Husbands, MD;  Location: [facility];  Service: General;  Laterality: N/A;  . EYE SURGERY     eye lid lift  . KNEE ARTHROSCOPY Right 08/28/2018   Procedure: ARTHROSCOPY KNEE;  Surgeon: Lovell Sheehan, MD;  Location: [place];  Service: Orthopedics;  Laterality: Right;  . PARTIAL HYSTERECTOMY    . TONSILLECTOMY      There were no vitals filed for this visit.  Subjective Assessment - 12/15/18 1930    Subjective  Pt. states R knee swelling has improved since last week.  Pt. doing exercises over weekend.  No new complaints.    Pertinent History  Pt is a 78 y.o. female 3 months s/p R knee arthoscopy on 08/28/2018. 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 The ACL showed partial  tearing of less than 50% and the PCL was intact. There was mild lateral subluxation of the patella.  Pt is sedentary and reports she does not like PT and is only coming per doctor's request.  Pt received shot of cortisone 2 weeks ago and has had no pain since.  Swelling progressively worsens throughout day.  Pt states she is a frequent faller.  Pt is inactive and expresses reluctance to activity/ exercises.  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  NA    Patient Stated Goals  Reduce pain, strengthen hip IR, pain-free kneeling, getting up from floor    Currently in Pain?  Yes    Pain Score  3     Pain Location  Knee    Pain Orientation  Right        There.ex.:  Nustep L5 B UE/LE 6min.  Standing hip ex.(4#): marching in //-bars/ hip abduction/ extension/ knee flexion/ heel and toe raises 20x.Seated marching/ LAQ with 4# 20x.   Walking in //-bars (forward/ backwards/high marching/ lateral walking)-3x each with mirror feedback (min. To no UE assist).  Sit to stands: 5x with no UE assist.  Supine SAQ/ quad sets/ SLR with light PT assist 10x2 each. Bolster bridging 10x2.Supine R hip/knee manual isometrics 5x 5 sec holds (no increase pain).  Supine hamstring/ hip stretches(generalized) 3x30 sec.  Modified hamstring stretches secondary to knee pain/ muscle guarding R>L.       PT Long Term Goals - 11/24/18 1646      PT LONG TERM GOAL #1   Title  Pt will score at least 59 on FOTO to indicated increased functional mobility.    Baseline  Pt FOTO score 41 (11/24/2018).    Time  4    Period  Weeks    Status  New    Target Date  12/22/18      PT LONG TERM GOAL #2   Title  Pt will be independent with HEP in order to decrease knee pain and increase strength in order to improve pain-free function at home.     Baseline  Pt administered beginner HEP for pain-free strengthening (11/24/2018).    Time  4    Period  Weeks    Status  New    Target Date  12/22/18      PT LONG TERM GOAL #3   Title  Pt will increase strength of hip flexion and hip IR by at least 1/2 MMT grade in order to demonstrate improvement in strength and function.    Baseline  Pt hip flexion R/L 4/4-, hip IR (R 4/5)    Time  4    Period  Weeks    Status  New    Target Date  12/22/18      PT LONG TERM GOAL #4   Title  Patient will report less than 3/10 on NPRS with functional, recreational, and at least 10 minutes of walking to indicate ability to perform functional activities with low levels of pain.    Baseline  Pain currentl 0/1o s/p injection, reassess in 1 month to ensure pain free status    Time  4    Period  Weeks    Status  New    Target Date  12/22/18      PT LONG TERM GOAL #5   Title  Pt will be able to perform SLS bilaterally for at least 10 seconds to demonstrate decreased risk of falls.    Baseline  R: 6-7 seconds, L: 3-4 seconds    Time  4    Period  Weeks    Status  New    Target Date  12/23/18            Plan - 12/15/18 1933    Clinical Impression Statement  Pt. pain limited/ muscle guarded during supine hamstring/ gastroc stretches.  R knee valgus limited standing therex./ BOS.  No balance issues noted during ther.ex.  Pt. has joint line tenderness with palpation but no distal quad or hamstring issues noted.  Pt. will continue with generalized LE strengthening ex. program.    Personal Factors and Comorbidities  Comorbidity 3+;Age;Behavior Pattern;Fitness;Past/Current Experience    Comorbidities  hyperlipidemia, HTN, hypothyroidism    Examination-Activity Limitations  Bend;Squat;Caring for Others;Stairs;Stand;Locomotion Level    Examination-Participation Restrictions  Church;School;Cleaning;Shop;Community Activity;Volunteer;Driving;Yard Work;Laundry;Meal Prep    Stability/Clinical Decision Making   Stable/Uncomplicated    Clinical Decision Making  Low    Rehab Potential  Good    PT Frequency  2x / week    PT Duration  4 weeks    PT Treatment/Interventions  ADLs/Self Care Home Management;Aquatic Therapy;Gait training;Traction;Moist Heat;Stair training;Functional mobility training;Therapeutic activities;Therapeutic  exercise;Balance training;Neuromuscular re-education;Electrical Stimulation;Cryotherapy;Manual techniques;Patient/family education;Passive range of motion;Dry needling;Energy conservation;Ultrasound;Splinting;Taping;Joint Manipulations;Spinal Manipulations;Scar mobilization    PT Next Visit Plan  Progress LE strengthening,    PT Home Exercise Plan  [id number]    Consulted and Agree with Plan of Care  Patient       Patient will benefit from skilled therapeutic intervention in order to improve the following deficits and impairments:  Abnormal gait, Pain, Improper body mechanics, Postural dysfunction, Other (comment), Decreased mobility, Decreased coordination, Decreased activity tolerance, Decreased endurance, Decreased balance, Decreased safety awareness, Impaired flexibility, Decreased range of motion, Obesity, Decreased strength, Cardiopulmonary status limiting activity, Difficulty walking, Decreased scar mobility, Hypomobility, Increased fascial restricitons  Visit Diagnosis: Chronic pain of right knee  Muscle weakness (generalized)  Other abnormalities of gait and mobility  Myalgia  Leg length difference, acquired     Problem List Patient Active Problem List   Diagnosis Date Noted  . Orthostasis 07/06/2018  . Travel advice encounter 10/19/2017  . Loss of perception for taste 10/19/2017  . Vertigo 10/19/2017  . Cystitis 03/05/2017  . Elevated blood pressure reading without diagnosis of hypertension 03/05/2017  . S/P laparoscopic cholecystectomy 03/05/2017  . Ptosis, both eyelids 01/04/2017  . Diarrhea in adult patient 01/04/2017  . Medicare annual wellness  visit, subsequent 07/24/2015  . Cerebrovascular small vessel disease 04/24/2015  . Benign paroxysmal positional vertigo 04/24/2015  . Right medial knee pain 04/24/2015  . Counseling for travel 01/11/2015  . Shoulder pain, right 11/22/2013  . Statin intolerance 10/08/2012  . Edema 05/14/2012  . Generalized anxiety disorder 01/06/2012  . 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 Hypothyroidism 12/18/2010  . Low back pain radiating to both legs 12/18/2010  . Hip pain, right 12/18/2010  . Obesity 05/09/2010  . CAD (coronary artery disease) 05/09/2010  . Hyperlipidemia 05/09/2010  . Hypertension 05/09/2010   Pura Spice, PT, DPT # [phone] 12/15/2018, 7:38 PM  [facility] [address] [place], [place], [address] Phone: [phone]   Fax:  [phone]  Name: Ash Brach MRN: [id number] Date of Birth: March 21, 1940

## 2018-12-17 ENCOUNTER — Encounter: Payer: Self-pay | Admitting: Physical Therapy

## 2018-12-17 ENCOUNTER — Other Ambulatory Visit: Payer: Self-pay

## 2018-12-17 ENCOUNTER — Ambulatory Visit: Payer: PPO | Admitting: Physical Therapy

## 2018-12-17 DIAGNOSIS — R2689 Other abnormalities of gait and mobility: Secondary | ICD-10-CM

## 2018-12-17 DIAGNOSIS — M25561 Pain in right knee: Secondary | ICD-10-CM | POA: Diagnosis not present

## 2018-12-17 DIAGNOSIS — G8929 Other chronic pain: Secondary | ICD-10-CM

## 2018-12-17 DIAGNOSIS — M2170 Unequal limb length (acquired), unspecified site: Secondary | ICD-10-CM

## 2018-12-17 DIAGNOSIS — M6281 Muscle weakness (generalized): Secondary | ICD-10-CM

## 2018-12-17 DIAGNOSIS — M791 Myalgia, unspecified site: Secondary | ICD-10-CM

## 2018-12-17 NOTE — Therapy (Signed)
[place] [facility] [address] [place], [place], [address] Phone: [phone]   Fax:  [phone]  Physical Therapy Treatment  Patient Details  Name: Meagan Cox MRN: [id number] Date of Birth: August 17, 1940 Referring Provider (PT): Uvaldo Bristle MD   Encounter Date: 12/17/2018  PT End of Session - 12/19/18 1523    Visit Number  6    Number of Visits  8    Date for PT Re-Evaluation  12/22/18    Authorization - Visit Number  6    Authorization - Number of Visits  10    PT Start Time  0931    PT Stop Time  1022    PT Time Calculation (min)  51 min    Activity Tolerance  Patient tolerated treatment well    Behavior During Therapy  [facility] for tasks assessed/performed       Past Medical History:  Diagnosis Date  . Hyperlipidemia   . Hypertension   . Hypothyroidism     Past Surgical History:  Procedure Laterality Date  . ABDOMINAL HYSTERECTOMY    . CARDIAC CATHETERIZATION     30 years ago, [place]  . CHOLECYSTECTOMY N/A 08/21/2015   Procedure: LAPAROSCOPIC CHOLECYSTECTOMY;  Surgeon: Jules Husbands, MD;  Location: [facility];  Service: General;  Laterality: N/A;  . EYE SURGERY     eye lid lift  . KNEE ARTHROSCOPY Right 08/28/2018   Procedure: ARTHROSCOPY KNEE;  Surgeon: Lovell Sheehan, MD;  Location: [place];  Service: Orthopedics;  Laterality: Right;  . PARTIAL HYSTERECTOMY    . TONSILLECTOMY      There were no vitals filed for this visit.  Subjective Assessment - 12/19/18 1518    Subjective  Pt. states her R knee is swelling again.  Pt. entered PT with mod. R antalgic gait pattern and c/o 3/10 R knee pain.    Pertinent History  Pt is a 78 y.o. female 3 months s/p R knee arthoscopy on 08/28/2018. 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 The ACL showed partial  tearing of less than 50% and the PCL was intact. There was mild lateral subluxation of the patella.  Pt is sedentary and reports she does not like PT and is only coming per doctor's request.  Pt received shot of cortisone 2 weeks ago and has had no pain since.  Swelling progressively worsens throughout day.  Pt states she is a frequent faller.  Pt is inactive and expresses reluctance to activity/ exercises.  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  NA    Patient Stated Goals  Reduce pain, strengthen hip IR, pain-free kneeling, getting up from floor    Currently in Pain?  Yes    Pain Score  3     Pain Location  Knee    Pain Orientation  Right    Pain Descriptors / Indicators  Aching          R knee flexion in supine: 108 deg.  Pt. Requires light assist with supine SLR/ marching.    Supine R hip flexion stretches/isometrics.     There.ex.:  Nustep L5 B UE/LE41min.  Supine SAQ/ quad sets/ SLR/ marching with light PT assist  10x2 each.  Moderate R hip weakness/ fatigue.   Bolster bridging 10x2.  Supine R hip/knee manual isometrics 5x 5 sec holds (no increase pain).  Supine hamstring/ hip stretches(generalized) 3x30 sec.  Modified hamstring stretches secondary to knee pain/ muscle guarding R>L.    Walking in //-bars (forward/ backwards/high marching/ lateral walking)-3x each with mirror feedback (min. To no UE assist).  Sit to stands: 5x with no UE assist.       PT Long Term Goals - 11/24/18 1646      PT LONG TERM GOAL #1   Title  Pt will score at least 59 on FOTO to indicated increased functional mobility.    Baseline  Pt FOTO score 41 (11/24/2018).    Time  4    Period  Weeks    Status  New    Target Date  12/22/18      PT LONG TERM GOAL #2   Title  Pt will be independent with HEP in order to  decrease knee pain and increase strength in order to improve pain-free function at home.    Baseline  Pt administered beginner HEP for pain-free strengthening (11/24/2018).    Time  4    Period  Weeks    Status  New    Target Date  12/22/18      PT LONG TERM GOAL #3   Title  Pt will increase strength of hip flexion and hip IR by at least 1/2 MMT grade in order to demonstrate improvement in strength and function.    Baseline  Pt hip flexion R/L 4/4-, hip IR (R 4/5)    Time  4    Period  Weeks    Status  New    Target Date  12/22/18      PT LONG TERM GOAL #4   Title  Patient will report less than 3/10 on NPRS with functional, recreational, and at least 10 minutes of walking to indicate ability to perform functional activities with low levels of pain.    Baseline  Pain currentl 0/1o s/p injection, reassess in 1 month to ensure pain free status    Time  4    Period  Weeks    Status  New    Target Date  12/22/18      PT LONG TERM GOAL #5   Title  Pt will be able to perform SLS bilaterally for at least 10 seconds to demonstrate decreased risk of falls.    Baseline  R: 6-7 seconds, L: 3-4 seconds    Time  4    Period  Weeks    Status  New    Target Date  12/23/18            Plan - 12/19/18 1524    Clinical Impression Statement  Persistent R knee pain with standing/ wt. bearing ex. in clinic.  PT focused on supine based ther.ex. to focus on strengthening of quad/ hamstring/ gastroc.  R knee flexion in supine: 108 deg.  Pt. Requires light assist with supine SLR/ marching secondary to muscle weakness.    Personal Factors and Comorbidities  Comorbidity 3+;Age;Behavior Pattern;Fitness;Past/Current Experience    Comorbidities  hyperlipidemia, HTN, hypothyroidism    Examination-Activity Limitations  Bend;Squat;Caring for Others;Stairs;Stand;Locomotion Level    Examination-Participation Restrictions  Church;School;Cleaning;Shop;Community Activity;Volunteer;Driving;Yard Work;Laundry;Meal  Prep    Stability/Clinical Decision Making  Stable/Uncomplicated    Clinical Decision Making  Low    Rehab Potential  Good    PT Frequency  2x /  week    PT Duration  4 weeks    PT Treatment/Interventions  ADLs/Self Care Home Management;Aquatic Therapy;Gait training;Traction;Moist Heat;Stair training;Functional mobility training;Therapeutic activities;Therapeutic exercise;Balance training;Neuromuscular re-education;Electrical Stimulation;Cryotherapy;Manual techniques;Patient/family education;Passive range of motion;Dry needling;Energy conservation;Ultrasound;Splinting;Taping;Joint Manipulations;Spinal Manipulations;Scar mobilization    PT Next Visit Plan  Progress LE strengthening,    PT Home Exercise Plan  [id number]    Consulted and Agree with Plan of Care  Patient       Patient will benefit from skilled therapeutic intervention in order to improve the following deficits and impairments:  Abnormal gait, Pain, Improper body mechanics, Postural dysfunction, Other (comment), Decreased mobility, Decreased coordination, Decreased activity tolerance, Decreased endurance, Decreased balance, Decreased safety awareness, Impaired flexibility, Decreased range of motion, Obesity, Decreased strength, Cardiopulmonary status limiting activity, Difficulty walking, Decreased scar mobility, Hypomobility, Increased fascial restricitons  Visit Diagnosis: Chronic pain of right knee  Muscle weakness (generalized)  Other abnormalities of gait and mobility  Myalgia  Leg length difference, acquired     Problem List Patient Active Problem List   Diagnosis Date Noted  . Orthostasis 07/06/2018  . Travel advice encounter 10/19/2017  . Loss of perception for taste 10/19/2017  . Vertigo 10/19/2017  . Cystitis 03/05/2017  . Elevated blood pressure reading without diagnosis of hypertension 03/05/2017  . S/P laparoscopic cholecystectomy 03/05/2017  . Ptosis, both eyelids 01/04/2017  . Diarrhea in adult patient  01/04/2017  . Medicare annual wellness visit, subsequent 07/24/2015  . Cerebrovascular small vessel disease 04/24/2015  . Benign paroxysmal positional vertigo 04/24/2015  . Right medial knee pain 04/24/2015  . Counseling for travel 01/11/2015  . Shoulder pain, right 11/22/2013  . Statin intolerance 10/08/2012  . Edema 05/14/2012  . Generalized anxiety disorder 01/06/2012  . 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 Hypothyroidism 12/18/2010  . Low back pain radiating to both legs 12/18/2010  . Hip pain, right 12/18/2010  . Obesity 05/09/2010  . CAD (coronary artery disease) 05/09/2010  . Hyperlipidemia 05/09/2010  . Hypertension 05/09/2010   Pura Spice, PT, DPT # [phone] 12/19/2018, 3:31 PM  [facility] [address] [place], [place], [address] Phone: [phone]   Fax:  [phone]  Name: Meagan Cox MRN: [id number] Date of Birth: May 08, 1940

## 2018-12-18 ENCOUNTER — Other Ambulatory Visit: Payer: Self-pay

## 2018-12-18 MED ORDER — ESTRADIOL 2 MG PO TABS: ORAL_TABLET | ORAL | 1 refills | Status: DC

## 2018-12-18 MED ORDER — LEVOTHYROXINE SODIUM 112 MCG PO TABS: ORAL_TABLET | ORAL | 0 refills | Status: DC

## 2018-12-22 ENCOUNTER — Ambulatory Visit: Payer: PPO | Admitting: Physical Therapy

## 2018-12-22 ENCOUNTER — Other Ambulatory Visit: Payer: Self-pay

## 2018-12-22 ENCOUNTER — Encounter: Payer: Self-pay | Admitting: Physical Therapy

## 2018-12-22 DIAGNOSIS — R2689 Other abnormalities of gait and mobility: Secondary | ICD-10-CM

## 2018-12-22 DIAGNOSIS — M2170 Unequal limb length (acquired), unspecified site: Secondary | ICD-10-CM

## 2018-12-22 DIAGNOSIS — M791 Myalgia, unspecified site: Secondary | ICD-10-CM

## 2018-12-22 DIAGNOSIS — M25561 Pain in right knee: Secondary | ICD-10-CM

## 2018-12-22 DIAGNOSIS — G8929 Other chronic pain: Secondary | ICD-10-CM

## 2018-12-22 DIAGNOSIS — M6281 Muscle weakness (generalized): Secondary | ICD-10-CM

## 2018-12-22 NOTE — Therapy (Signed)
[facility] [address] [place], [place], [address] Phone: [phone]   Fax:  [phone]  Physical Therapy Treatment  Patient Details  Name: Meagan Cox MRN: [id number] Date of Birth: 21-Oct-1940 Referring Provider (PT): Uvaldo Bristle MD   Encounter Date: 12/22/2018  PT End of Session - 12/22/18 1006    Visit Number  7    Number of Visits  8    Date for PT Re-Evaluation  12/22/18    Authorization - Visit Number  7    Authorization - Number of Visits  10    PT Start Time  0941    PT Stop Time  1032    PT Time Calculation (min)  51 min    Activity Tolerance  Patient tolerated treatment well    Behavior During Therapy  [facility] for tasks assessed/performed       Past Medical History:  Diagnosis Date  . Hyperlipidemia   . Hypertension   . Hypothyroidism     Past Surgical History:  Procedure Laterality Date  . ABDOMINAL HYSTERECTOMY    . CARDIAC CATHETERIZATION     30 years ago, [place]  . CHOLECYSTECTOMY N/A 08/21/2015   Procedure: LAPAROSCOPIC CHOLECYSTECTOMY;  Surgeon: Jules Husbands, MD;  Location: [facility];  Service: General;  Laterality: N/A;  . EYE SURGERY     eye lid lift  . KNEE ARTHROSCOPY Right 08/28/2018   Procedure: ARTHROSCOPY KNEE;  Surgeon: Lovell Sheehan, MD;  Location: [place];  Service: Orthopedics;  Laterality: Right;  . PARTIAL HYSTERECTOMY    . TONSILLECTOMY      There were no vitals filed for this visit.  Subjective Assessment - 12/22/18 0949    Subjective  MD f/u on 12/15 with Dr. Harlow Mares.  Pt. states her knee swelling is doing better over the weekend.    Pertinent History  Pt is a 78 y.o. female 3 months s/p R knee arthoscopy on 08/28/2018. 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 The ACL showed partial tearing of less than  50% and the PCL was intact. There was mild lateral subluxation of the patella.  Pt is sedentary and reports she does not like PT and is only coming per doctor's request.  Pt received shot of cortisone 2 weeks ago and has had no pain since.  Swelling progressively worsens throughout day.  Pt states she is a frequent faller.  Pt is inactive and expresses reluctance to activity/ exercises.  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  NA    Patient Stated Goals  Reduce pain, strengthen hip IR, pain-free kneeling, getting up from floor    Currently in Pain?  Yes    Pain Score  3     Pain Location  Knee    Pain Orientation  Right    Pain Descriptors / Indicators  Aching         There.ex.:  Nustep L5 BUE/LE22min.  Seated/ standing 4# ex.: LAQ/ marching/ heel and toe raises 20x each.  Walking in //-bars with high marching/ lateral walking 5x in //-bars (no mirror feedback)  Supine SAQ/ quad sets/ SLR with light PT assist 10x2 each.  R hip discomfort, esp. At  greater trochanter   Bolster bridging 10x with short holds.    Supine hamstring/ hip stretches(generalized)3x30 sec. Modified hamstring stretches secondary to knee pain/ muscle guarding R>L.   Ascending/ descending stairs with focus on R quad control during L LE step down 4 stairs 3x (B UE assist on handrails).    Sit to stands: 5x with no UE assist.     PT Long Term Goals - 11/24/18 1646      PT LONG TERM GOAL #1   Title  Pt will score at least 59 on FOTO to indicated increased functional mobility.    Baseline  Pt FOTO score 41 (11/24/2018).    Time  4    Period  Weeks    Status  New    Target Date  12/22/18      PT LONG TERM GOAL #2   Title  Pt will be independent with HEP in order to decrease knee pain and increase strength in order to improve  pain-free function at home.    Baseline  Pt administered beginner HEP for pain-free strengthening (11/24/2018).    Time  4    Period  Weeks    Status  New    Target Date  12/22/18      PT LONG TERM GOAL #3   Title  Pt will increase strength of hip flexion and hip IR by at least 1/2 MMT grade in order to demonstrate improvement in strength and function.    Baseline  Pt hip flexion R/L 4/4-, hip IR (R 4/5)    Time  4    Period  Weeks    Status  New    Target Date  12/22/18      PT LONG TERM GOAL #4   Title  Patient will report less than 3/10 on NPRS with functional, recreational, and at least 10 minutes of walking to indicate ability to perform functional activities with low levels of pain.    Baseline  Pain currentl 0/1o s/p injection, reassess in 1 month to ensure pain free status    Time  4    Period  Weeks    Status  New    Target Date  12/22/18      PT LONG TERM GOAL #5   Title  Pt will be able to perform SLS bilaterally for at least 10 seconds to demonstrate decreased risk of falls.    Baseline  R: 6-7 seconds, L: 3-4 seconds    Time  4    Period  Weeks    Status  New    Target Date  12/23/18            Plan - 12/22/18 1011    Clinical Impression Statement  FOTO: initial 41/ today 47/ goal 47.  Pt. continues to show slow but consistent progress with R knee strengthening/ functional mobility.  No increase c/o R knee pain but R hip discomfort (lateral/anterior) with supine SLR and palpation around R greater trochanter/ ITB.  Moderate R knee valgus remains with standing/ walking in PT clinic.  Pt. instructed to stay active with daily walking/ HEP.    Personal Factors and Comorbidities  Comorbidity 3+;Age;Behavior Pattern;Fitness;Past/Current Experience    Comorbidities  hyperlipidemia, HTN, hypothyroidism    Examination-Activity Limitations  Bend;Squat;Caring for Others;Stairs;Stand;Locomotion Level    Examination-Participation Restrictions   Church;School;Cleaning;Shop;Community Activity;Volunteer;Driving;Yard Work;Laundry;Meal Prep    Stability/Clinical Decision Making  Stable/Uncomplicated    Clinical Decision Making  Low    Rehab Potential  Good  PT Frequency  2x / week    PT Duration  4 weeks    PT Treatment/Interventions  ADLs/Self Care Home Management;Aquatic Therapy;Gait training;Traction;Moist Heat;Stair training;Functional mobility training;Therapeutic activities;Therapeutic exercise;Balance training;Neuromuscular re-education;Electrical Stimulation;Cryotherapy;Manual techniques;Patient/family education;Passive range of motion;Dry needling;Energy conservation;Ultrasound;Splinting;Taping;Joint Manipulations;Spinal Manipulations;Scar mobilization    PT Next Visit Plan  Progress LE strengthening,  CHECK GOALS/ RECERT next tx. (check schedule).    PT Home Exercise Plan  [id number]    Consulted and Agree with Plan of Care  Patient       Patient will benefit from skilled therapeutic intervention in order to improve the following deficits and impairments:  Abnormal gait, Pain, Improper body mechanics, Postural dysfunction, Other (comment), Decreased mobility, Decreased coordination, Decreased activity tolerance, Decreased endurance, Decreased balance, Decreased safety awareness, Impaired flexibility, Decreased range of motion, Obesity, Decreased strength, Cardiopulmonary status limiting activity, Difficulty walking, Decreased scar mobility, Hypomobility, Increased fascial restricitons  Visit Diagnosis: Chronic pain of right knee  Muscle weakness (generalized)  Other abnormalities of gait and mobility  Myalgia  Leg length difference, acquired     Problem List Patient Active Problem List   Diagnosis Date Noted  . Orthostasis 07/06/2018  . Travel advice encounter 10/19/2017  . Loss of perception for taste 10/19/2017  . Vertigo 10/19/2017  . Cystitis 03/05/2017  . Elevated blood pressure reading without diagnosis of  hypertension 03/05/2017  . S/P laparoscopic cholecystectomy 03/05/2017  . Ptosis, both eyelids 01/04/2017  . Diarrhea in adult patient 01/04/2017  . Medicare annual wellness visit, subsequent 07/24/2015  . Cerebrovascular small vessel disease 04/24/2015  . Benign paroxysmal positional vertigo 04/24/2015  . Right medial knee pain 04/24/2015  . Counseling for travel 01/11/2015  . Shoulder pain, right 11/22/2013  . Statin intolerance 10/08/2012  . Edema 05/14/2012  . Generalized anxiety disorder 01/06/2012  . 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 Hypothyroidism 12/18/2010  . Low back pain radiating to both legs 12/18/2010  . Hip pain, right 12/18/2010  . Obesity 05/09/2010  . CAD (coronary artery disease) 05/09/2010  . Hyperlipidemia 05/09/2010  . Hypertension 05/09/2010   Pura Spice, PT, DPT # [id number] 12/22/2018, 11:45 AM  [facility] [address] [place], [place], [address] Phone: [phone]   Fax:  [phone]  Name: Meagan Cox MRN: [id number] Date of Birth: Aug 04, 1940

## 2018-12-24 ENCOUNTER — Ambulatory Visit: Payer: PPO | Admitting: Physical Therapy

## 2018-12-24 ENCOUNTER — Other Ambulatory Visit: Payer: Self-pay

## 2018-12-24 ENCOUNTER — Encounter: Payer: Self-pay | Admitting: Physical Therapy

## 2018-12-24 DIAGNOSIS — M6281 Muscle weakness (generalized): Secondary | ICD-10-CM

## 2018-12-24 DIAGNOSIS — M2170 Unequal limb length (acquired), unspecified site: Secondary | ICD-10-CM

## 2018-12-24 DIAGNOSIS — G8929 Other chronic pain: Secondary | ICD-10-CM

## 2018-12-24 DIAGNOSIS — M25561 Pain in right knee: Secondary | ICD-10-CM | POA: Diagnosis not present

## 2018-12-24 DIAGNOSIS — R2689 Other abnormalities of gait and mobility: Secondary | ICD-10-CM

## 2018-12-24 DIAGNOSIS — M791 Myalgia, unspecified site: Secondary | ICD-10-CM

## 2018-12-24 NOTE — Therapy (Signed)
[facility] [address] [place], [place], [address] Phone: [phone]   Fax:  [phone]  Physical Therapy Treatment  Patient Details  Name: Meagan Cox MRN: [id number] Date of Birth: 1940-11-25 Referring Provider (PT): Uvaldo Bristle MD   Encounter Date: 12/24/2018  PT End of Session - 12/25/18 1110    Visit Number  8    Number of Visits  15    Date for PT Re-Evaluation  01/21/19    Authorization - Visit Number  1    Authorization - Number of Visits  10    PT Start Time  0931    PT Stop Time  1020    PT Time Calculation (min)  49 min    Activity Tolerance  Patient tolerated treatment well    Behavior During Therapy  [facility] for tasks assessed/performed       Past Medical History:  Diagnosis Date  . Hyperlipidemia   . Hypertension   . Hypothyroidism     Past Surgical History:  Procedure Laterality Date  . ABDOMINAL HYSTERECTOMY    . CARDIAC CATHETERIZATION     30 years ago, [place]  . CHOLECYSTECTOMY N/A 08/21/2015   Procedure: LAPAROSCOPIC CHOLECYSTECTOMY;  Surgeon: Jules Husbands, MD;  Location: [facility];  Service: General;  Laterality: N/A;  . EYE SURGERY     eye lid lift  . KNEE ARTHROSCOPY Right 08/28/2018   Procedure: ARTHROSCOPY KNEE;  Surgeon: Lovell Sheehan, MD;  Location: [place];  Service: Orthopedics;  Laterality: Right;  . PARTIAL HYSTERECTOMY    . TONSILLECTOMY      There were no vitals filed for this visit.  Subjective Assessment - 12/25/18 1107    Subjective  Pt. entered PT with no new complaints.  Pt. continues to report compliance with HEP and R lateral knee soreness with increase walking.    Pertinent History  Pt is a 78 y.o. female 3 months s/p R knee arthoscopy on 08/28/2018. 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 The  ACL showed partial tearing of less than 50% and the PCL was intact. There was mild lateral subluxation of the patella.  Pt is sedentary and reports she does not like PT and is only coming per doctor's request.  Pt received shot of cortisone 2 weeks ago and has had no pain since.  Swelling progressively worsens throughout day.  Pt states she is a frequent faller.  Pt is inactive and expresses reluctance to activity/ exercises.  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  NA    Patient Stated Goals  Reduce pain, strengthen hip IR, pain-free kneeling, getting up from floor    Currently in Pain?  Yes    Pain Score  2     Pain Location  Knee    Pain Orientation  Right          There.ex.:  Nustep L5 BUE/LE51mn.    Seated/ standing 4# ex.: LAQ/ marching/ heel and toe raises 20x each.  Walking in //-bars with high marching/ lateral walking 5x in //-bars (no mirror feedback)  Supine SAQ/ quad sets/ SLRwith light PT assist 10x2 each. R lateral knee tenderness  Supine hamstring/ hip stretches(generalized)3x30  sec. Modified hamstring stretches secondary to knee pain/ muscle guarding R>L.   Ascending/ descending stairs with focus on R quad control during L LE step down 4 stairs 3x (B UE assist on handrails).    Sit to stands: 5x with no UE assist.  Tandem stance/ gait.   SLS (<5 sec.)     PT Long Term Goals - 12/25/18 1117      PT LONG TERM GOAL #1   Title  Pt will score at least 59 on FOTO to indicated increased functional mobility.    Baseline  Pt FOTO score 41 (11/24/2018).  On 11/25: FOTO: today 47/ goal 59    Time  4    Period  Weeks    Status  Partially Met    Target Date  01/21/19      PT LONG TERM GOAL #2   Title  Pt will be independent with HEP in order to decrease knee pain and increase strength  in order to improve pain-free function at home.    Baseline  Pt administered beginner HEP for pain-free strengthening (11/24/2018).    Time  4    Period  Weeks    Status  Achieved    Target Date  01/21/19      PT LONG TERM GOAL #3   Title  Pt will increase strength of hip flexion and hip IR by at least 1/2 MMT grade in order to demonstrate improvement in strength and function.    Baseline  Pt hip flexion R/L 4/4-, hip IR (R 4/5).  On 11/25: R hip flexoin 4/5 MMT    Time  4    Period  Weeks    Status  Partially Met    Target Date  01/21/19      PT LONG TERM GOAL #4   Title  Patient will report less than 3/10 on NPRS with functional, recreational, and at least 10 minutes of walking to indicate ability to perform functional activities with low levels of pain.    Baseline  Pain currentl 0/1o s/p injection, reassess in 1 month to ensure pain free status.  On 11/25: Pt. has continued R knee tenderness/ pain with increase activity    Time  4    Period  Weeks    Status  Not Met    Target Date  01/21/19      PT LONG TERM GOAL #5   Title  Pt will be able to perform SLS bilaterally for at least 10 seconds to demonstrate decreased risk of falls.    Baseline  R: 6-7 seconds, L: 3-4 seconds    Time  4    Period  Weeks    Status  Not Met    Target Date  01/21/19            Plan - 12/25/18 1111    Clinical Impression Statement  Pt. continues to present with moderate R knee valgus and R lateral knee tenderness.  PT explained knee anatomy and structure of meniscus.  R hip flexion/ IR strength remains limited to 4/5 MMT with increase anterior hip discomfort with repeated SLR in supine position.  Difficulty with higher level balance tasks, such as tandem and SLS.  Pt. ambulates with a cautious gait pattern and demonstrates good independence with daily tasks.  Pt. able to ascend/ descend stairs safely.  See updated goals.    Personal Factors and Comorbidities  Comorbidity 3+;Age;Behavior  Pattern;Fitness;Past/Current Experience    Comorbidities  hyperlipidemia, HTN, hypothyroidism  Examination-Activity Limitations  Bend;Squat;Caring for Others;Stairs;Stand;Locomotion Level    Examination-Participation Restrictions  Church;School;Cleaning;Shop;Community Activity;Volunteer;Driving;Yard Work;Laundry;Meal Prep    Stability/Clinical Decision Making  Stable/Uncomplicated    Clinical Decision Making  Low    Rehab Potential  Good    PT Frequency  2x / week    PT Duration  4 weeks    PT Treatment/Interventions  ADLs/Self Care Home Management;Aquatic Therapy;Gait training;Traction;Moist Heat;Stair training;Functional mobility training;Therapeutic activities;Therapeutic exercise;Balance training;Neuromuscular re-education;Electrical Stimulation;Cryotherapy;Manual techniques;Patient/family education;Passive range of motion;Dry needling;Energy conservation;Ultrasound;Splinting;Taping;Joint Manipulations;Spinal Manipulations;Scar mobilization    PT Next Visit Plan  Progress LE strengthening,  Check schedule (MD f/u 12/15)    PT Home Exercise Plan  [place] and Agree with Plan of Care  Patient       Patient will benefit from skilled therapeutic intervention in order to improve the following deficits and impairments:  Abnormal gait, Pain, Improper body mechanics, Postural dysfunction, Other (comment), Decreased mobility, Decreased coordination, Decreased activity tolerance, Decreased endurance, Decreased balance, Decreased safety awareness, Impaired flexibility, Decreased range of motion, Obesity, Decreased strength, Cardiopulmonary status limiting activity, Difficulty walking, Decreased scar mobility, Hypomobility, Increased fascial restricitons  Visit Diagnosis: Chronic pain of right knee  Muscle weakness (generalized)  Other abnormalities of gait and mobility  Myalgia  Leg length difference, acquired     Problem List Patient Active Problem List   Diagnosis Date  Noted  . Orthostasis 07/06/2018  . Travel advice encounter 10/19/2017  . Loss of perception for taste 10/19/2017  . Vertigo 10/19/2017  . Cystitis 03/05/2017  . Elevated blood pressure reading without diagnosis of hypertension 03/05/2017  . S/P laparoscopic cholecystectomy 03/05/2017  . Ptosis, both eyelids 01/04/2017  . Diarrhea in adult patient 01/04/2017  . Medicare annual wellness visit, subsequent 07/24/2015  . Cerebrovascular small vessel disease 04/24/2015  . Benign paroxysmal positional vertigo 04/24/2015  . Right medial knee pain 04/24/2015  . Counseling for travel 01/11/2015  . Shoulder pain, right 11/22/2013  . Statin intolerance 10/08/2012  . Edema 05/14/2012  . Generalized anxiety disorder 01/06/2012  . 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 Hypothyroidism 12/18/2010  . Low back pain radiating to both legs 12/18/2010  . Hip pain, right 12/18/2010  . Obesity 05/09/2010  . CAD (coronary artery disease) 05/09/2010  . Hyperlipidemia 05/09/2010  . Hypertension 05/09/2010   Pura Spice, PT, DPT # [id number] 12/25/2018, 11:20 AM  [place] [facility] [address] [place], [place], [address] Phone: [phone]   Fax:  [phone]  Name: Meagan Cox MRN: [id number] Date of Birth: 06-23-1940

## 2018-12-29 ENCOUNTER — Encounter: Payer: Self-pay | Admitting: Physical Therapy

## 2018-12-29 ENCOUNTER — Ambulatory Visit: Payer: PPO | Admitting: Physical Therapy

## 2018-12-29 ENCOUNTER — Other Ambulatory Visit: Payer: Self-pay

## 2018-12-29 DIAGNOSIS — R2689 Other abnormalities of gait and mobility: Secondary | ICD-10-CM

## 2018-12-29 DIAGNOSIS — M6281 Muscle weakness (generalized): Secondary | ICD-10-CM

## 2018-12-29 DIAGNOSIS — M2170 Unequal limb length (acquired), unspecified site: Secondary | ICD-10-CM

## 2018-12-29 DIAGNOSIS — M791 Myalgia, unspecified site: Secondary | ICD-10-CM

## 2018-12-29 DIAGNOSIS — M25561 Pain in right knee: Secondary | ICD-10-CM | POA: Diagnosis not present

## 2018-12-29 DIAGNOSIS — G8929 Other chronic pain: Secondary | ICD-10-CM

## 2018-12-29 NOTE — Therapy (Signed)
[place] [facility] [address] [place], [place], [address] Phone: [phone]   Fax:  [phone]  Physical Therapy Treatment  Patient Details  Name: Meagan Cox MRN: [id number] Date of Birth: 1940/11/03 Referring Provider (PT): Deborra Medina MD   Encounter Date: 12/29/2018  PT End of Session - 12/29/18 0948    Visit Number  9    Number of Visits  15    Date for PT Re-Evaluation  01/21/19    Authorization - Visit Number  2    Authorization - Number of Visits  10    PT Start Time  0944    PT Stop Time  1029    PT Time Calculation (min)  45 min    Activity Tolerance  Patient tolerated treatment well    Behavior During Therapy  [facility] for tasks assessed/performed       Past Medical History:  Diagnosis Date  . Hyperlipidemia   . Hypertension   . Hypothyroidism     Past Surgical History:  Procedure Laterality Date  . ABDOMINAL HYSTERECTOMY    . CARDIAC CATHETERIZATION     30 years ago, [place]  . CHOLECYSTECTOMY N/A 08/21/2015   Procedure: LAPAROSCOPIC CHOLECYSTECTOMY;  Surgeon: Jules Husbands, MD;  Location: [facility];  Service: General;  Laterality: N/A;  . EYE SURGERY     eye lid lift  . KNEE ARTHROSCOPY Right 08/28/2018   Procedure: ARTHROSCOPY KNEE;  Surgeon: Lovell Sheehan, MD;  Location: [place];  Service: Orthopedics;  Laterality: Right;  . PARTIAL HYSTERECTOMY    . TONSILLECTOMY      There were no vitals filed for this visit.  Subjective Assessment - 12/29/18 0947    Subjective  Pt. reports 4/10 R knee pain this morning.  Pt. states she was active at home this past weekend.  Pt. reports being clumsy a couple of time but no falls.    Pertinent History  Pt is a 78 y.o. female 3 months s/p R knee arthoscopy on 08/28/2018. 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 The ACL showed partial tearing of less than 50% and the PCL was intact. There was mild lateral subluxation of the patella.  Pt is sedentary and reports she does not like PT and is only coming per doctor's request.  Pt received shot of cortisone 2 weeks ago and has had no pain since.  Swelling progressively worsens throughout day.  Pt states she is a frequent faller.  Pt is inactive and expresses reluctance to activity/ exercises.  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  NA    Patient Stated Goals  Reduce pain, strengthen hip IR, pain-free kneeling, getting up from floor    Currently in Pain?  Yes    Pain Score  4     Pain Location  Knee    Pain Orientation  Right    Pain Descriptors / Indicators  Aching          There.ex.:  Nustep L5 BUE/LE33mn.    Seated/ standing 5# ex.: LAQ/ marching/ heel and toe raises 20x each.  Standing hip extension/ knee flexion 20x each. Walking in //-bars with high marching/ lateral walking 5x in //-  bars (no mirror feedback)  Seated hamstring/ gastroc stretches3x30 sec.   Ascending/ descending stairs with focus on R quad control during L LE step down 4 stairs 3x (B UE assist on handrails).  5# ankle wt. Airex step ups/downs (forward and lateral) with no UE assist 10x.    Sit to stands: 5x with no UE assist.  Walking in hallway with cuing to increase BOS/ cadence/ upright posture.  Good heel strike but limited arm swing.  No LOB.   Supine R SLR (requires PT assist)/ marching/ hip abduction 20x. Each.       PT Long Term Goals - 12/25/18 1117      PT LONG TERM GOAL #1   Title  Pt will score at least 59 on FOTO to indicated increased functional mobility.    Baseline  Pt FOTO score 41 (11/24/2018).  On 11/25: FOTO: today 47/ goal 59    Time  4    Period   Weeks    Status  Partially Met    Target Date  01/21/19      PT LONG TERM GOAL #2   Title  Pt will be independent with HEP in order to decrease knee pain and increase strength in order to improve pain-free function at home.    Baseline  Pt administered beginner HEP for pain-free strengthening (11/24/2018).    Time  4    Period  Weeks    Status  Achieved    Target Date  01/21/19      PT LONG TERM GOAL #3   Title  Pt will increase strength of hip flexion and hip IR by at least 1/2 MMT grade in order to demonstrate improvement in strength and function.    Baseline  Pt hip flexion R/L 4/4-, hip IR (R 4/5).  On 11/25: R hip flexoin 4/5 MMT    Time  4    Period  Weeks    Status  Partially Met    Target Date  01/21/19      PT LONG TERM GOAL #4   Title  Patient will report less than 3/10 on NPRS with functional, recreational, and at least 10 minutes of walking to indicate ability to perform functional activities with low levels of pain.    Baseline  Pain currentl 0/1o s/p injection, reassess in 1 month to ensure pain free status.  On 11/25: Pt. has continued R knee tenderness/ pain with increase activity    Time  4    Period  Weeks    Status  Not Met    Target Date  01/21/19      PT LONG TERM GOAL #5   Title  Pt will be able to perform SLS bilaterally for at least 10 seconds to demonstrate decreased risk of falls.    Baseline  R: 6-7 seconds, L: 3-4 seconds    Time  4    Period  Weeks    Status  Not Met    Target Date  01/21/19            Plan - 12/29/18 0949    Clinical Impression Statement  Pt. still unable to complete a full SLR independently in supine position due to R hip weakness/ discomfort.  Pt. able to march in supine and complete resisted hip flexion/ step ups with R hip without issue.  R hamstring flexibility remains more limited than L during LE reassessment.  Pt. able to complete Airex step ups with good R ankle/knee control but  descending stairs remains difficulty in  R knee with eccentric muscle control.  Good balance but muscle weakness noted.    Personal Factors and Comorbidities  Comorbidity 3+;Age;Behavior Pattern;Fitness;Past/Current Experience    Comorbidities  hyperlipidemia, HTN, hypothyroidism    Examination-Activity Limitations  Bend;Squat;Caring for Others;Stairs;Stand;Locomotion Level    Examination-Participation Restrictions  Church;School;Cleaning;Shop;Community Activity;Volunteer;Driving;Yard Work;Laundry;Meal Prep    Stability/Clinical Decision Making  Stable/Uncomplicated    Clinical Decision Making  Low    Rehab Potential  Good    PT Frequency  2x / week    PT Duration  4 weeks    PT Treatment/Interventions  ADLs/Self Care Home Management;Aquatic Therapy;Gait training;Traction;Moist Heat;Stair training;Functional mobility training;Therapeutic activities;Therapeutic exercise;Balance training;Neuromuscular re-education;Electrical Stimulation;Cryotherapy;Manual techniques;Patient/family education;Passive range of motion;Dry needling;Energy conservation;Ultrasound;Splinting;Taping;Joint Manipulations;Spinal Manipulations;Scar mobilization    PT Next Visit Plan  Progress LE strengthening,  Check schedule (MD f/u 12/15)    PT Home Exercise Plan  [place] and Agree with Plan of Care  Patient       Patient will benefit from skilled therapeutic intervention in order to improve the following deficits and impairments:  Abnormal gait, Pain, Improper body mechanics, Postural dysfunction, Other (comment), Decreased mobility, Decreased coordination, Decreased activity tolerance, Decreased endurance, Decreased balance, Decreased safety awareness, Impaired flexibility, Decreased range of motion, Obesity, Decreased strength, Cardiopulmonary status limiting activity, Difficulty walking, Decreased scar mobility, Hypomobility, Increased fascial restricitons  Visit Diagnosis: Chronic pain of right knee  Muscle weakness (generalized)  Other  abnormalities of gait and mobility  Myalgia  Leg length difference, acquired     Problem List Patient Active Problem List   Diagnosis Date Noted  . Orthostasis 07/06/2018  . Travel advice encounter 10/19/2017  . Loss of perception for taste 10/19/2017  . Vertigo 10/19/2017  . Cystitis 03/05/2017  . Elevated blood pressure reading without diagnosis of hypertension 03/05/2017  . S/P laparoscopic cholecystectomy 03/05/2017  . Ptosis, both eyelids 01/04/2017  . Diarrhea in adult patient 01/04/2017  . Medicare annual wellness visit, subsequent 07/24/2015  . Cerebrovascular small vessel disease 04/24/2015  . Benign paroxysmal positional vertigo 04/24/2015  . Right medial knee pain 04/24/2015  . Counseling for travel 01/11/2015  . Shoulder pain, right 11/22/2013  . Statin intolerance 10/08/2012  . Edema 05/14/2012  . Generalized anxiety disorder 01/06/2012  . 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 Hypothyroidism 12/18/2010  . Low back pain radiating to both legs 12/18/2010  . Hip pain, right 12/18/2010  . Obesity 05/09/2010  . CAD (coronary artery disease) 05/09/2010  . Hyperlipidemia 05/09/2010  . Hypertension 05/09/2010   Pura Spice, PT, DPT # [id number] 12/29/2018, 10:57 AM  [place] [facility] [address] [place], [place], [address] Phone: [phone]   Fax:  [phone]  Name: Meagan Cox MRN: [id number] Date of Birth: 22-Nov-1940

## 2018-12-31 ENCOUNTER — Encounter: Payer: Self-pay | Admitting: Physical Therapy

## 2018-12-31 ENCOUNTER — Ambulatory Visit: Payer: PPO | Attending: Orthopedic Surgery | Admitting: Physical Therapy

## 2018-12-31 ENCOUNTER — Other Ambulatory Visit: Payer: Self-pay

## 2018-12-31 DIAGNOSIS — M2170 Unequal limb length (acquired), unspecified site: Secondary | ICD-10-CM | POA: Diagnosis not present

## 2018-12-31 DIAGNOSIS — G8929 Other chronic pain: Secondary | ICD-10-CM | POA: Diagnosis not present

## 2018-12-31 DIAGNOSIS — R2689 Other abnormalities of gait and mobility: Secondary | ICD-10-CM | POA: Insufficient documentation

## 2018-12-31 DIAGNOSIS — M6281 Muscle weakness (generalized): Secondary | ICD-10-CM | POA: Diagnosis not present

## 2018-12-31 DIAGNOSIS — M791 Myalgia, unspecified site: Secondary | ICD-10-CM | POA: Insufficient documentation

## 2018-12-31 DIAGNOSIS — M25561 Pain in right knee: Secondary | ICD-10-CM | POA: Insufficient documentation

## 2018-12-31 NOTE — Therapy (Signed)
[facility] [facility] [address] [place], [place], [address] Phone: [phone]   Fax:  [phone]  Physical Therapy Treatment  Patient Details  Name: Meagan Cox MRN: [id number] Date of Birth: [birth year] Referring Provider (PT): Deborra Medina MD   Encounter Date: 12/31/2018  PT End of Session - 12/31/18 1030    Visit Number  10    Number of Visits  15    Date for PT Re-Evaluation  01/21/19    Authorization - Visit Number  3    Authorization - Number of Visits  10    PT Start Time  8185    PT Stop Time  1031    PT Time Calculation (min)  47 min    Activity Tolerance  Patient tolerated treatment well;No increased pain    Behavior During Therapy  WFL for tasks assessed/performed       Past Medical History:  Diagnosis Date  . Hyperlipidemia   . Hypertension   . Hypothyroidism     Past Surgical History:  Procedure Laterality Date  . ABDOMINAL HYSTERECTOMY    . CARDIAC CATHETERIZATION     30 years ago, [facility]  . CHOLECYSTECTOMY N/A 08/21/2015   Procedure: LAPAROSCOPIC CHOLECYSTECTOMY;  Surgeon: Jules Husbands, MD;  Location: [facility];  Service: General;  Laterality: N/A;  . EYE SURGERY     eye lid lift  . KNEE ARTHROSCOPY Right 08/28/2018   Procedure: ARTHROSCOPY KNEE;  Surgeon: Lovell Sheehan, MD;  Location: [place];  Service: Orthopedics;  Laterality: Right;  . PARTIAL HYSTERECTOMY    . TONSILLECTOMY      There were no vitals filed for this visit.  Subjective Assessment - 12/31/18 0948    Subjective  Pt. states it is too cold outside and plans to spend the day at home.  Pt. reports no new pain symptoms and states "I am good".    Pertinent History  Pt is a 78 y.o. female 3 months s/p R knee arthoscopy on 08/28/2018. 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 The ACL showed partial tearing of less than 50% and the PCL was intact. There was mild lateral subluxation of the patella.  Pt is sedentary and reports she does not like PT and is only coming per doctor's request.  Pt received shot of cortisone 2 weeks ago and has had no pain since.  Swelling progressively worsens throughout day.  Pt states she is a frequent faller.  Pt is inactive and expresses reluctance to activity/ exercises.  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  NA    Patient Stated Goals  Reduce pain, strengthen hip IR, pain-free kneeling, getting up from floor    Currently in Pain?  No/denies         There.ex.:  Nustep L5 BUE/LE21mn. Warm-up/ discussed activity at home and exercises.    Walking in //-bars with cone taps (L/R) with light to no UE assist (mirror feedback).  Seated/ standing 5# ex.: LAQ/ marching/ heel and toe raises 20x each.  Standing hip extension/ knee flexion 20x each. Walking in //-bars with high marching/ lateral walking 5x in //-bars (no mirror feedback)  Ascending/ descending stairs with focus on  R quad control during L LE step down 4 stairs 3x (B UE assist on handrails).  Star ex.: cone taps with light UE assist on hospital table with R/L SLS 5x each.    Supine R SLR (requires PT assist)/ marching/ hip abduction/ heel slides 10x. Each.   Supine L/R LE stretches (generalized)- focus on R hamstring/ proximal gastroc 2x30 sec each. .    Walking in hallway with cuing to increase BOS/ cadence/ upright posture.  Good heel strike but limited arm swing.  No LOB.        PT Long Term Goals - 12/25/18 1117      PT LONG TERM GOAL #1   Title  Pt will score at least 59 on FOTO to indicated increased functional mobility.    Baseline  Pt FOTO score 41 (11/24/2018).  On  11/25: FOTO: today 47/ goal 59    Time  4    Period  Weeks    Status  Partially Met    Target Date  01/21/19      PT LONG TERM GOAL #2   Title  Pt will be independent with HEP in order to decrease knee pain and increase strength in order to improve pain-free function at home.    Baseline  Pt administered beginner HEP for pain-free strengthening (11/24/2018).    Time  4    Period  Weeks    Status  Achieved    Target Date  01/21/19      PT LONG TERM GOAL #3   Title  Pt will increase strength of hip flexion and hip IR by at least 1/2 MMT grade in order to demonstrate improvement in strength and function.    Baseline  Pt hip flexion R/L 4/4-, hip IR (R 4/5).  On 11/25: R hip flexoin 4/5 MMT    Time  4    Period  Weeks    Status  Partially Met    Target Date  01/21/19      PT LONG TERM GOAL #4   Title  Patient will report less than 3/10 on NPRS with functional, recreational, and at least 10 minutes of walking to indicate ability to perform functional activities with low levels of pain.    Baseline  Pain currentl 0/1o s/p injection, reassess in 1 month to ensure pain free status.  On 11/25: Pt. has continued R knee tenderness/ pain with increase activity    Time  4    Period  Weeks    Status  Not Met    Target Date  01/21/19      PT LONG TERM GOAL #5   Title  Pt will be able to perform SLS bilaterally for at least 10 seconds to demonstrate decreased risk of falls.    Baseline  R: 6-7 seconds, L: 3-4 seconds    Time  4    Period  Weeks    Status  Not Met    Target Date  01/21/19        Plan - 12/31/18 1031    Clinical Impression Statement  Difficulty with SLS tasks (ie. cone taps/ star ex.) without use of 1 UE assist for safety.  No LOB but LE muscle weakness noted (R LE weakness > L).  Pt. ambulates with controlled step pattern/ heel strike around clinic.  Pt. did work hard today with resisted ther.ex./ quad control/ descending stairs.  Pt. encouraged to remain active this weekend  with daily walking/ HEP.  No changes to  HEP at this time.    Personal Factors and Comorbidities  Comorbidity 3+;Age;Behavior Pattern;Fitness;Past/Current Experience    Comorbidities  hyperlipidemia, HTN, hypothyroidism    Examination-Activity Limitations  Bend;Squat;Caring for Others;Stairs;Stand;Locomotion Level    Examination-Participation Restrictions  Church;School;Cleaning;Shop;Community Activity;Volunteer;Driving;Yard Work;Laundry;Meal Prep    Stability/Clinical Decision Making  Stable/Uncomplicated    Clinical Decision Making  Low    Rehab Potential  Good    PT Frequency  2x / week    PT Duration  4 weeks    PT Treatment/Interventions  ADLs/Self Care Home Management;Aquatic Therapy;Gait training;Traction;Moist Heat;Stair training;Functional mobility training;Therapeutic activities;Therapeutic exercise;Balance training;Neuromuscular re-education;Electrical Stimulation;Cryotherapy;Manual techniques;Patient/family education;Passive range of motion;Dry needling;Energy conservation;Ultrasound;Splinting;Taping;Joint Manipulations;Spinal Manipulations;Scar mobilization    PT Next Visit Plan  Progress LE strengthening,  Check schedule (MD f/u 12/15)    PT Home Exercise Plan  [place] and Agree with Plan of Care  Patient       Patient will benefit from skilled therapeutic intervention in order to improve the following deficits and impairments:  Abnormal gait, Pain, Improper body mechanics, Postural dysfunction, Other (comment), Decreased mobility, Decreased coordination, Decreased activity tolerance, Decreased endurance, Decreased balance, Decreased safety awareness, Impaired flexibility, Decreased range of motion, Obesity, Decreased strength, Cardiopulmonary status limiting activity, Difficulty walking, Decreased scar mobility, Hypomobility, Increased fascial restricitons  Visit Diagnosis: Chronic pain of right knee  Muscle weakness (generalized)  Other abnormalities of gait and  mobility  Myalgia  Leg length difference, acquired     Problem List Patient Active Problem List   Diagnosis Date Noted  . Orthostasis 07/06/2018  . Travel advice encounter 10/19/2017  . Loss of perception for taste 10/19/2017  . Vertigo 10/19/2017  . Cystitis 03/05/2017  . Elevated blood pressure reading without diagnosis of hypertension 03/05/2017  . S/P laparoscopic cholecystectomy 03/05/2017  . Ptosis, both eyelids 01/04/2017  . Diarrhea in adult patient 01/04/2017  . Medicare annual wellness visit, subsequent 07/24/2015  . Cerebrovascular small vessel disease 04/24/2015  . Benign paroxysmal positional vertigo 04/24/2015  . Right medial knee pain 04/24/2015  . Counseling for travel 01/11/2015  . Shoulder pain, right 11/22/2013  . Statin intolerance 10/08/2012  . Edema 05/14/2012  . Generalized anxiety disorder 01/06/2012  . 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 Hypothyroidism 12/18/2010  . Low back pain radiating to both legs 12/18/2010  . Hip pain, right 12/18/2010  . Obesity 05/09/2010  . CAD (coronary artery disease) 05/09/2010  . Hyperlipidemia 05/09/2010  . Hypertension 05/09/2010   Pura Spice, PT, DPT # [id number] 12/31/2018, 10:33 AM  [place] [address] [place], [place], [address] Phone: [phone]   Fax:  [phone]  Name: Meagan Cox MRN: [id number] Date of Birth: Cox 20, 1942

## 2019-01-05 ENCOUNTER — Ambulatory Visit: Payer: PPO | Admitting: Physical Therapy

## 2019-01-05 ENCOUNTER — Other Ambulatory Visit: Payer: Self-pay

## 2019-01-05 ENCOUNTER — Encounter: Payer: Self-pay | Admitting: Physical Therapy

## 2019-01-05 DIAGNOSIS — M25561 Pain in right knee: Secondary | ICD-10-CM

## 2019-01-05 DIAGNOSIS — R2689 Other abnormalities of gait and mobility: Secondary | ICD-10-CM

## 2019-01-05 DIAGNOSIS — M6281 Muscle weakness (generalized): Secondary | ICD-10-CM

## 2019-01-05 DIAGNOSIS — M2170 Unequal limb length (acquired), unspecified site: Secondary | ICD-10-CM

## 2019-01-05 DIAGNOSIS — M791 Myalgia, unspecified site: Secondary | ICD-10-CM

## 2019-01-05 NOTE — Therapy (Signed)
[place] [facility] [address] [place], [place], [address] Phone: [phone]   Fax:  [phone]  Physical Therapy Treatment  Patient Details  Name: Meagan Cox MRN: [id number] Date of Birth: 1940/08/12 Referring Provider (PT): Deborra Medina MD   Encounter Date: 01/05/2019  PT End of Session - 01/05/19 0948    Visit Number  11    Number of Visits  15    Date for PT Re-Evaluation  01/21/19    Authorization - Visit Number  4    Authorization - Number of Visits  10    PT Start Time  [phone]    PT Stop Time  1025    PT Time Calculation (min)  43 min    Activity Tolerance  Patient tolerated treatment well;Patient limited by pain    Behavior During Therapy  [employer] for tasks assessed/performed       Past Medical History:  Diagnosis Date  . Hyperlipidemia   . Hypertension   . Hypothyroidism     Past Surgical History:  Procedure Laterality Date  . ABDOMINAL HYSTERECTOMY    . CARDIAC CATHETERIZATION     30 years ago, [facility]  . CHOLECYSTECTOMY N/A 08/21/2015   Procedure: LAPAROSCOPIC CHOLECYSTECTOMY;  Surgeon: Jules Husbands, MD;  Location: [facility];  Service: General;  Laterality: N/A;  . EYE SURGERY     eye lid lift  . KNEE ARTHROSCOPY Right 08/28/2018   Procedure: ARTHROSCOPY KNEE;  Surgeon: Lovell Sheehan, MD;  Location: [place];  Service: Orthopedics;  Laterality: Right;  . PARTIAL HYSTERECTOMY    . TONSILLECTOMY      There were no vitals filed for this visit.  Subjective Assessment - 01/05/19 0947    Subjective  Pt. states R lateral knee has been sore/ hurting this past weekend.    Pertinent History  Pt is a 78 y.o. female 3 months s/p R knee arthoscopy on 08/28/2018. 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 The ACL showed partial tearing of less than  50% and the PCL was intact. There was mild lateral subluxation of the patella.  Pt is sedentary and reports she does not like PT and is only coming per doctor's request.  Pt received shot of cortisone 2 weeks ago and has had no pain since.  Swelling progressively worsens throughout day.  Pt states she is a frequent faller.  Pt is inactive and expresses reluctance to activity/ exercises.  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  NA    Patient Stated Goals  Reduce pain, strengthen hip IR, pain-free kneeling, getting up from floor    Currently in Pain?  Yes    Pain Score  4     Pain Location  Knee    Pain Orientation  Right    Pain Descriptors / Indicators  Aching         There.ex.:  Nustep L5 BUE/LE46mn.  Discussed R knee alignment  Supine R/L marching/ bolster bridging/ SAQ 10x2 each.  R SLR (requires PT assist)- 10x.  Increase R hip discomfort/ moderate fatigue noted.  Reviewed HEP/ no closed chain ex. Today.   Walking in hallway with cuing to increase BOS/ cadence/ upright posture.  Good heel strike but limited arm swing. No LOB.    Manual tx.:  Supine L/R LE stretches (generalized)- focus on R hamstring/ proximal gastroc 2x30 sec each.   Supine R LE LAD 3x 20 sec. Each (slight decrease in pain)  Supine R proximal tibia AP/PA grade II-III mobs. 3x20 sec. (increase flexion/ extension). Varus glides to correct valgus knee position/ R lateral knee pain.   STM to R distal quad/ hamstring.  Tenderness noted on R lateral joint line.      PT Long Term Goals - 12/25/18 1117      PT LONG TERM GOAL #1   Title  Pt will score at least 59 on FOTO to indicated increased functional mobility.    Baseline  Pt FOTO score 41 (11/24/2018).  On 11/25: FOTO: today 47/ goal 59    Time  4    Period  Weeks    Status   Partially Met    Target Date  01/21/19      PT LONG TERM GOAL #2   Title  Pt will be independent with HEP in order to decrease knee pain and increase strength in order to improve pain-free function at home.    Baseline  Pt administered beginner HEP for pain-free strengthening (11/24/2018).    Time  4    Period  Weeks    Status  Achieved    Target Date  01/21/19      PT LONG TERM GOAL #3   Title  Pt will increase strength of hip flexion and hip IR by at least 1/2 MMT grade in order to demonstrate improvement in strength and function.    Baseline  Pt hip flexion R/L 4/4-, hip IR (R 4/5).  On 11/25: R hip flexoin 4/5 MMT    Time  4    Period  Weeks    Status  Partially Met    Target Date  01/21/19      PT LONG TERM GOAL #4   Title  Patient will report less than 3/10 on NPRS with functional, recreational, and at least 10 minutes of walking to indicate ability to perform functional activities with low levels of pain.    Baseline  Pain currentl 0/1o s/p injection, reassess in 1 month to ensure pain free status.  On 11/25: Pt. has continued R knee tenderness/ pain with increase activity    Time  4    Period  Weeks    Status  Not Met    Target Date  01/21/19      PT LONG TERM GOAL #5   Title  Pt will be able to perform SLS bilaterally for at least 10 seconds to demonstrate decreased risk of falls.    Baseline  R: 6-7 seconds, L: 3-4 seconds    Time  4    Period  Weeks    Status  Not Met    Target Date  01/21/19            Plan - 01/05/19 0948    Clinical Impression Statement  Pt. pain limited in R lateral knee with standing ther.ex./ walking today.  Moderate valgus and limited benefit from mobs./ quad and ITB stretches.  Slight improvement in R knee pain with LAD and SAQ knee positioning while in supine posture.  Pt. ambulates with R antalgic gait pattern, esp. with initial aspects of standing/walking after prolonged sitting.  Pt. returns to MD next week.    Personal Factors and  Comorbidities  Comorbidity 3+;Age;Behavior  Pattern;Fitness;Past/Current Experience    Comorbidities  hyperlipidemia, HTN, hypothyroidism    Examination-Activity Limitations  Bend;Squat;Caring for Others;Stairs;Stand;Locomotion Level    Examination-Participation Restrictions  Church;School;Cleaning;Shop;Community Activity;Volunteer;Driving;Yard Work;Laundry;Meal Prep    Stability/Clinical Decision Making  Stable/Uncomplicated    Clinical Decision Making  Low    Rehab Potential  Good    PT Frequency  2x / week    PT Duration  4 weeks    PT Treatment/Interventions  ADLs/Self Care Home Management;Aquatic Therapy;Gait training;Traction;Moist Heat;Stair training;Functional mobility training;Therapeutic activities;Therapeutic exercise;Balance training;Neuromuscular re-education;Electrical Stimulation;Cryotherapy;Manual techniques;Patient/family education;Passive range of motion;Dry needling;Energy conservation;Ultrasound;Splinting;Taping;Joint Manipulations;Spinal Manipulations;Scar mobilization    PT Next Visit Plan  Progress LE strengthening,  Check schedule (MD f/u 12/15)    PT Home Exercise Plan  [place] and Agree with Plan of Care  Patient       Patient will benefit from skilled therapeutic intervention in order to improve the following deficits and impairments:  Abnormal gait, Pain, Improper body mechanics, Postural dysfunction, Other (comment), Decreased mobility, Decreased coordination, Decreased activity tolerance, Decreased endurance, Decreased balance, Decreased safety awareness, Impaired flexibility, Decreased range of motion, Obesity, Decreased strength, Cardiopulmonary status limiting activity, Difficulty walking, Decreased scar mobility, Hypomobility, Increased fascial restricitons  Visit Diagnosis: Chronic pain of right knee  Muscle weakness (generalized)  Other abnormalities of gait and mobility  Myalgia  Leg length difference, acquired     Problem  List Patient Active Problem List   Diagnosis Date Noted  . Orthostasis 07/06/2018  . Travel advice encounter 10/19/2017  . Loss of perception for taste 10/19/2017  . Vertigo 10/19/2017  . Cystitis 03/05/2017  . Elevated blood pressure reading without diagnosis of hypertension 03/05/2017  . S/P laparoscopic cholecystectomy 03/05/2017  . Ptosis, both eyelids 01/04/2017  . Diarrhea in adult patient 01/04/2017  . Medicare annual wellness visit, subsequent 07/24/2015  . Cerebrovascular small vessel disease 04/24/2015  . Benign paroxysmal positional vertigo 04/24/2015  . Right medial knee pain 04/24/2015  . Counseling for travel 01/11/2015  . Shoulder pain, right 11/22/2013  . Statin intolerance 10/08/2012  . Edema 05/14/2012  . Generalized anxiety disorder 01/06/2012  . 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 Hypothyroidism 12/18/2010  . Low back pain radiating to both legs 12/18/2010  . Hip pain, right 12/18/2010  . Obesity 05/09/2010  . CAD (coronary artery disease) 05/09/2010  . Hyperlipidemia 05/09/2010  . Hypertension 05/09/2010   Pura Spice, PT, DPT # [id number] 01/05/2019, 1:15 PM  [place] [facility] [address] [place], [place], [address] Phone: [phone]   Fax:  [phone]  Name: Meagan Cox MRN: [id number] Date of Birth: 1940-07-14

## 2019-01-07 ENCOUNTER — Ambulatory Visit: Payer: PPO | Admitting: Physical Therapy

## 2019-01-07 ENCOUNTER — Encounter: Payer: Self-pay | Admitting: Physical Therapy

## 2019-01-07 ENCOUNTER — Other Ambulatory Visit: Payer: Self-pay

## 2019-01-07 DIAGNOSIS — M6281 Muscle weakness (generalized): Secondary | ICD-10-CM

## 2019-01-07 DIAGNOSIS — M791 Myalgia, unspecified site: Secondary | ICD-10-CM

## 2019-01-07 DIAGNOSIS — M25561 Pain in right knee: Secondary | ICD-10-CM | POA: Diagnosis not present

## 2019-01-07 DIAGNOSIS — G8929 Other chronic pain: Secondary | ICD-10-CM

## 2019-01-07 DIAGNOSIS — M2170 Unequal limb length (acquired), unspecified site: Secondary | ICD-10-CM

## 2019-01-07 DIAGNOSIS — R2689 Other abnormalities of gait and mobility: Secondary | ICD-10-CM

## 2019-01-10 NOTE — Therapy (Signed)
[place] [facility] [address] [place], [place], [address] Phone: [phone]   Fax:  [phone]  Physical Therapy Treatment  Patient Details  Name: Meagan Cox MRN: [id number] Date of Birth: 02/04/1940 Referring Provider (PT): Teresa Tullo MD   Encounter Date: 01/07/2019  PT End of Session - 01/10/19 1525    Visit Number  12    Number of Visits  15    Date for PT Re-Evaluation  01/21/19    Authorization - Visit Number  5    Authorization - Number of Visits  10    PT Start Time  0942    PT Stop Time  1026    PT Time Calculation (min)  44 min    Activity Tolerance  Patient tolerated treatment well;Patient limited by pain    Behavior During Therapy  WFL for tasks assessed/performed       Past Medical History:  Diagnosis Date  . Hyperlipidemia   . Hypertension   . Hypothyroidism     Past Surgical History:  Procedure Laterality Date  . ABDOMINAL HYSTERECTOMY    . CARDIAC CATHETERIZATION     30 years ago, [place]  . CHOLECYSTECTOMY N/A 08/21/2015   Procedure: LAPAROSCOPIC CHOLECYSTECTOMY;  Surgeon: Diego F Pabon, MD;  Location: [facility];  Service: General;  Laterality: N/A;  . EYE SURGERY     eye lid lift  . KNEE ARTHROSCOPY Right 08/28/2018   Procedure: ARTHROSCOPY KNEE;  Surgeon: Bowers, James R, MD;  Location: [facility];  Service: Orthopedics;  Laterality: Right;  . PARTIAL HYSTERECTOMY    . TONSILLECTOMY      There were no vitals filed for this visit.      Pt. states R lateral knee has been sore/ hurting this past weekend.      No Nustep today due to increase c/o R knee pain   Manual tx.:  Supine R LE/ hip and knee stretches (generalized)- increase static holds as tolerated. Supine R LE LAD 4x with gentle oscillations as tolerated. Supine R proximal tibia AP/PA grade II-III mobs. 3x30 sec.   R distal quad/ hamstring STM 6 min.  (+) R lateral knee joint line tenderness.   Pt. Instructed  to ice R knee at home to manage tenderness/ pain    PT Long Term Goals - 12/25/18 1117      PT LONG TERM GOAL #1   Title  Pt will score at least 59 on FOTO to indicated increased functional mobility.    Baseline  Pt FOTO score 41 (11/24/2018).  On 11/25: FOTO: today 47/ goal 59    Time  4    Period  Weeks    Status  Partially Met    Target Date  01/21/19      PT LONG TERM GOAL #2   Title  Pt will be independent with HEP in order to decrease knee pain and increase strength in order to improve pain-free function at home.    Baseline  Pt administered beginner HEP for pain-free strengthening (11/24/2018).    Time  4    Period  Weeks    Status  Achieved    Target Date  01/21/19      PT LONG TERM GOAL #3   Title  Pt will increase strength of hip flexion and hip IR by at least 1/2 MMT grade in order to demonstrate improvement in strength and function.    Baseline  Pt hip flexion R/L 4/4-, hip   IR (R 4/5).  On 11/25: R hip flexoin 4/5 MMT    Time  4    Period  Weeks    Status  Partially Met    Target Date  01/21/19      PT LONG TERM GOAL #4   Title  Patient will report less than 3/10 on NPRS with functional, recreational, and at least 10 minutes of walking to indicate ability to perform functional activities with low levels of pain.    Baseline  Pain currentl 0/1o s/p injection, reassess in 1 month to ensure pain free status.  On 11/25: Pt. has continued R knee tenderness/ pain with increase activity    Time  4    Period  Weeks    Status  Not Met    Target Date  01/21/19      PT LONG TERM GOAL #5   Title  Pt will be able to perform SLS bilaterally for at least 10 seconds to demonstrate decreased risk of falls.    Baseline  R: 6-7 seconds, L: 3-4 seconds    Time  4    Period  Weeks    Status  Not Met    Target Date  01/21/19        Plan - 01/10/19 1531    Clinical Impression Statement  Overall tx. limited today by increase c/o R lateral knee pain.  Pt. attempted to complete  step ups/downs but pain limited.  PT focused on manual tx. during tx. session to increase R knee joint spacing/ pain mgmt.  No change to HEP and pt. instructed to contact MD if R knee pain persists or worsens.    Personal Factors and Comorbidities  Comorbidity 3+;Age;Behavior Pattern;Fitness;Past/Current Experience    Comorbidities  hyperlipidemia, HTN, hypothyroidism    Examination-Activity Limitations  Bend;Squat;Caring for Others;Stairs;Stand;Locomotion Level    Examination-Participation Restrictions  Church;School;Cleaning;Shop;Community Activity;Volunteer;Driving;Yard Work;Laundry;Meal Prep    Stability/Clinical Decision Making  Stable/Uncomplicated    Clinical Decision Making  Low    Rehab Potential  Good    PT Frequency  2x / week    PT Duration  4 weeks    PT Treatment/Interventions  ADLs/Self Care Home Management;Aquatic Therapy;Gait training;Traction;Moist Heat;Stair training;Functional mobility training;Therapeutic activities;Therapeutic exercise;Balance training;Neuromuscular re-education;Electrical Stimulation;Cryotherapy;Manual techniques;Patient/family education;Passive range of motion;Dry needling;Energy conservation;Ultrasound;Splinting;Taping;Joint Manipulations;Spinal Manipulations;Scar mobilization    PT Next Visit Plan  Progress LE strengthening,  Check schedule (MD f/u 12/15)    PT Home Exercise Plan  [id number]    Consulted and Agree with Plan of Care  Patient       Patient will benefit from skilled therapeutic intervention in order to improve the following deficits and impairments:  Abnormal gait, Pain, Improper body mechanics, Postural dysfunction, Other (comment), Decreased mobility, Decreased coordination, Decreased activity tolerance, Decreased endurance, Decreased balance, Decreased safety awareness, Impaired flexibility, Decreased range of motion, Obesity, Decreased strength, Cardiopulmonary status limiting activity, Difficulty walking, Decreased scar mobility,  Hypomobility, Increased fascial restricitons  Visit Diagnosis: Chronic pain of right knee  Muscle weakness (generalized)  Other abnormalities of gait and mobility  Myalgia  Leg length difference, acquired     Problem List Patient Active Problem List   Diagnosis Date Noted  . Orthostasis 07/06/2018  . Travel advice encounter 10/19/2017  . Loss of perception for taste 10/19/2017  . Vertigo 10/19/2017  . Cystitis 03/05/2017  . Elevated blood pressure reading without diagnosis of hypertension 03/05/2017  . S/P laparoscopic cholecystectomy 03/05/2017  . Ptosis, both eyelids 01/04/2017  . Diarrhea in adult patient 01/04/2017  .   Medicare annual wellness visit, subsequent 07/24/2015  . Cerebrovascular small vessel disease 04/24/2015  . Benign paroxysmal positional vertigo 04/24/2015  . Right medial knee pain 04/24/2015  . Counseling for travel 01/11/2015  . Shoulder pain, right 11/22/2013  . Statin intolerance 10/08/2012  . Edema 05/14/2012  . Generalized anxiety disorder 01/06/2012  . 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 Hypothyroidism 12/18/2010  . Low back pain radiating to both legs 12/18/2010  . Hip pain, right 12/18/2010  . Obesity 05/09/2010  . CAD (coronary artery disease) 05/09/2010  . Hyperlipidemia 05/09/2010  . Hypertension 05/09/2010   Michael C Sherk, PT, DPT # [id number] 01/10/2019, 3:34 PM  [place] [facility] [address] [place], [place], [address] Phone: [phone]   Fax:  [phone]  Name: Taytum Jones Fera MRN: [id number] Date of Birth: 03/28/1940   

## 2019-01-12 ENCOUNTER — Encounter: Payer: Self-pay | Admitting: Physical Therapy

## 2019-01-12 ENCOUNTER — Ambulatory Visit: Payer: PPO | Admitting: Physical Therapy

## 2019-01-12 ENCOUNTER — Other Ambulatory Visit: Payer: Self-pay

## 2019-01-12 DIAGNOSIS — M791 Myalgia, unspecified site: Secondary | ICD-10-CM

## 2019-01-12 DIAGNOSIS — M6281 Muscle weakness (generalized): Secondary | ICD-10-CM

## 2019-01-12 DIAGNOSIS — M25561 Pain in right knee: Secondary | ICD-10-CM | POA: Diagnosis not present

## 2019-01-12 DIAGNOSIS — R2689 Other abnormalities of gait and mobility: Secondary | ICD-10-CM

## 2019-01-12 DIAGNOSIS — M2170 Unequal limb length (acquired), unspecified site: Secondary | ICD-10-CM

## 2019-01-12 DIAGNOSIS — G8929 Other chronic pain: Secondary | ICD-10-CM

## 2019-01-12 NOTE — Therapy (Signed)
[place] [facility] [address] [place], [place], [address] Phone: [phone]   Fax:  [phone]  Physical Therapy Treatment  Patient Details  Name: Meagan Cox MRN: [id number] Date of Birth: December 15, 1940 Referring Provider (PT): Deborra Medina MD   Encounter Date: 01/12/2019  PT End of Session - 01/12/19 0944    Visit Number  13    Number of Visits  15    Date for PT Re-Evaluation  01/21/19    Authorization - Visit Number  6    Authorization - Number of Visits  10    PT Start Time  0944    PT Stop Time  1032    PT Time Calculation (min)  48 min    Activity Tolerance  Patient tolerated treatment well;Patient limited by pain    Behavior During Therapy  [facility] for tasks assessed/performed       Past Medical History:  Diagnosis Date  . Hyperlipidemia   . Hypertension   . Hypothyroidism     Past Surgical History:  Procedure Laterality Date  . ABDOMINAL HYSTERECTOMY    . CARDIAC CATHETERIZATION     30 years ago, [place]  . CHOLECYSTECTOMY N/A 08/21/2015   Procedure: LAPAROSCOPIC CHOLECYSTECTOMY;  Surgeon: Jules Husbands, MD;  Location: [facility];  Service: General;  Laterality: N/A;  . EYE SURGERY     eye lid lift  . KNEE ARTHROSCOPY Right 08/28/2018   Procedure: ARTHROSCOPY KNEE;  Surgeon: Lovell Sheehan, MD;  Location: [facility];  Service: Orthopedics;  Laterality: Right;  . PARTIAL HYSTERECTOMY    . TONSILLECTOMY      There were no vitals filed for this visit.  Subjective Assessment - 01/12/19 0950    Subjective  Pt. c/o 5/10 R lateral knee pain.  Pt. states she has MD appt. tomorrow.  Pt. has been taking Potassium for muscle cramps in quadriceps with benefit.    Pertinent History  Pt is a 78 y.o. female 3 months s/p R knee arthoscopy on 08/28/2018. 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 The ACL showed partial tearing of less than 50% and the PCL was intact. There was mild lateral subluxation of the patella.  Pt is sedentary and reports she does not like PT and is only coming per doctor's request.  Pt received shot of cortisone 2 weeks ago and has had no pain since.  Swelling progressively worsens throughout day.  Pt states she is a frequent faller.  Pt is inactive and expresses reluctance to activity/ exercises.  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  NA    Patient Stated Goals  Reduce pain, strengthen hip IR, pain-free kneeling, getting up from floor    Currently in Pain?  Yes    Pain Score  5     Pain Location  Knee    Pain Orientation  Right    Pain Descriptors / Indicators  Aching        R knee AROM: -2 to 122 deg.    (+) R patellar pain with medial glides/ lateral joint line palpation.      There.ex.:  Nustep L5 BUE/LE35mn.     Walking in hallway with cuing  to increase BOS/ cadence/ upright posture. Discussed R knee extension/ heel strike/ toe off.    Supine R/L marching 20x.  R SLR (requires PT assist)- 10x.  Increase R hip discomfort/ moderate fatigue noted.  Reviewed HEP  Stair climbing (step to pattern/ R knee pain increased with eccentric muscle control with descending stairs).  Heavy UE assist to manage R LE wt. Bearing.     Manual tx.:  Supine L/R LE stretches (generalized)- focus on R hamstring/ proximal gastroc 2x30 sec each.   Supine R LE LAD 3x 20 sec. Each (slight decrease in pain)  Supine R proximal tibia AP/PA grade II-III mobs. 3x20 sec. (increase flexion/ extension). Varus glides to correct valgus knee position/ R lateral knee pain.   STM to R distal quad/ hamstring.  Tenderness noted on R lateral joint line.       PT Long Term Goals - 12/25/18 1117      PT LONG TERM GOAL #1   Title  Pt will score at least 59 on FOTO to indicated increased functional mobility.    Baseline  Pt FOTO score 41 (11/24/2018).  On 11/25: FOTO: today 47/ goal 59    Time  4    Period  Weeks    Status  Partially Met    Target Date  01/21/19      PT LONG TERM GOAL #2   Title  Pt will be independent with HEP in order to decrease knee pain and increase strength in order to improve pain-free function at home.    Baseline  Pt administered beginner HEP for pain-free strengthening (11/24/2018).    Time  4    Period  Weeks    Status  Achieved    Target Date  01/21/19      PT LONG TERM GOAL #3   Title  Pt will increase strength of hip flexion and hip IR by at least 1/2 MMT grade in order to demonstrate improvement in strength and function.    Baseline  Pt hip flexion R/L 4/4-, hip IR (R 4/5).  On 11/25: R hip flexoin 4/5 MMT    Time  4    Period  Weeks    Status  Partially Met    Target Date  01/21/19      PT LONG TERM GOAL #4   Title  Patient will report less than 3/10 on NPRS with functional, recreational, and at least 10 minutes of walking to indicate ability to perform functional activities with low levels of pain.    Baseline  Pain currentl 0/1o s/p injection, reassess in 1 month to ensure pain free status.  On 11/25: Pt. has continued R knee tenderness/ pain with increase activity    Time  4    Period  Weeks    Status  Not Met    Target Date  01/21/19      PT LONG TERM GOAL #5   Title  Pt will be able to perform SLS bilaterally for at least 10 seconds to demonstrate decreased risk of falls.    Baseline  R: 6-7 seconds, L: 3-4 seconds    Time  4    Period  Weeks    Status  Not Met    Target Date  01/21/19            Plan - 01/12/19 0944    Clinical Impression Statement  Pt. remains limited with tx. progression due to persistent/ worsening R knee lateral knee pain.  Pt. remains  pain limited with  eccentric quad control while descending stairs.  Heavy UE assist during stairs, lunges and balance tasks in //-bars.  Pt. will continue with current HEP and will contact PT after MD f/u to discuss POC.    Personal Factors and Comorbidities  Comorbidity 3+;Age;Behavior Pattern;Fitness;Past/Current Experience    Comorbidities  hyperlipidemia, HTN, hypothyroidism    Examination-Activity Limitations  Bend;Squat;Caring for Others;Stairs;Stand;Locomotion Level    Examination-Participation Restrictions  Church;School;Cleaning;Shop;Community Activity;Volunteer;Driving;Yard Work;Laundry;Meal Prep    Stability/Clinical Decision Making  Stable/Uncomplicated    Clinical Decision Making  Low    Rehab Potential  Good    PT Frequency  2x / week    PT Duration  4 weeks    PT Treatment/Interventions  ADLs/Self Care Home Management;Aquatic Therapy;Gait training;Traction;Moist Heat;Stair training;Functional mobility training;Therapeutic activities;Therapeutic exercise;Balance training;Neuromuscular re-education;Electrical Stimulation;Cryotherapy;Manual techniques;Patient/family education;Passive range of motion;Dry needling;Energy conservation;Ultrasound;Splinting;Taping;Joint Manipulations;Spinal Manipulations;Scar mobilization    PT Next Visit Plan  Discuss MD f/u 12/15    PT Home Exercise Plan  [id number]    Consulted and Agree with Plan of Care  Patient       Patient will benefit from skilled therapeutic intervention in order to improve the following deficits and impairments:  Abnormal gait, Pain, Improper body mechanics, Postural dysfunction, Other (comment), Decreased mobility, Decreased coordination, Decreased activity tolerance, Decreased endurance, Decreased balance, Decreased safety awareness, Impaired flexibility, Decreased range of motion, Obesity, Decreased strength, Cardiopulmonary status limiting activity, Difficulty walking, Decreased scar mobility, Hypomobility, Increased fascial restricitons  Visit  Diagnosis: Chronic pain of right knee  Muscle weakness (generalized)  Other abnormalities of gait and mobility  Myalgia  Leg length difference, acquired     Problem List Patient Active Problem List   Diagnosis Date Noted  . Orthostasis 07/06/2018  . Travel advice encounter 10/19/2017  . Loss of perception for taste 10/19/2017  . Vertigo 10/19/2017  . Cystitis 03/05/2017  . Elevated blood pressure reading without diagnosis of hypertension 03/05/2017  . S/P laparoscopic cholecystectomy 03/05/2017  . Ptosis, both eyelids 01/04/2017  . Diarrhea in adult patient 01/04/2017  . Medicare annual wellness visit, subsequent 07/24/2015  . Cerebrovascular small vessel disease 04/24/2015  . Benign paroxysmal positional vertigo 04/24/2015  . Right medial knee pain 04/24/2015  . Counseling for travel 01/11/2015  . Shoulder pain, right 11/22/2013  . Statin intolerance 10/08/2012  . Edema 05/14/2012  . Generalized anxiety disorder 01/06/2012  . 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 Hypothyroidism 12/18/2010  . Low back pain radiating to both legs 12/18/2010  . Hip pain, right 12/18/2010  . Obesity 05/09/2010  . CAD (coronary artery disease) 05/09/2010  . Hyperlipidemia 05/09/2010  . Hypertension 05/09/2010   Pura Spice, PT, DPT # [phone] 01/12/2019, 12:32 PM  [place] [facility] [address] [place], [place], [address] Phone: [phone]   Fax:  [phone]  Name: Meagan Cox MRN: [id number] Date of Birth: 1940/08/05

## 2019-01-13 DIAGNOSIS — Z9889 Other specified postprocedural states: Secondary | ICD-10-CM | POA: Diagnosis not present

## 2019-01-13 DIAGNOSIS — M25561 Pain in right knee: Secondary | ICD-10-CM | POA: Diagnosis not present

## 2019-01-14 ENCOUNTER — Encounter: Payer: PPO | Admitting: Physical Therapy

## 2019-01-19 ENCOUNTER — Encounter: Payer: PPO | Admitting: Physical Therapy

## 2019-01-21 ENCOUNTER — Encounter: Payer: PPO | Admitting: Physical Therapy

## 2019-01-26 ENCOUNTER — Encounter: Payer: PPO | Admitting: Physical Therapy

## 2019-01-28 ENCOUNTER — Encounter: Payer: PPO | Admitting: Physical Therapy

## 2019-01-30 HISTORY — PX: REPLACEMENT TOTAL KNEE: SUR1224

## 2019-02-09 DIAGNOSIS — Z9889 Other specified postprocedural states: Secondary | ICD-10-CM | POA: Diagnosis not present

## 2019-02-09 DIAGNOSIS — M25561 Pain in right knee: Secondary | ICD-10-CM | POA: Diagnosis not present

## 2019-02-16 DIAGNOSIS — Z96641 Presence of right artificial hip joint: Secondary | ICD-10-CM | POA: Diagnosis not present

## 2019-02-16 DIAGNOSIS — M1711 Unilateral primary osteoarthritis, right knee: Secondary | ICD-10-CM | POA: Diagnosis not present

## 2019-02-18 ENCOUNTER — Other Ambulatory Visit: Payer: Self-pay

## 2019-02-19 MED ORDER — LEVOTHYROXINE SODIUM 112 MCG PO TABS: ORAL_TABLET | ORAL | 0 refills | Status: DC

## 2019-02-23 ENCOUNTER — Other Ambulatory Visit: Payer: Self-pay

## 2019-02-23 MED ORDER — LEVOTHYROXINE SODIUM 112 MCG PO TABS: ORAL_TABLET | ORAL | 0 refills | Status: DC

## 2019-03-30 ENCOUNTER — Telehealth: Payer: Self-pay

## 2019-03-30 ENCOUNTER — Telehealth: Payer: Self-pay | Admitting: Internal Medicine

## 2019-03-30 NOTE — Telephone Encounter (Signed)
Spoke with Benjamine Mola to let her know that we have received the fax for the surgical clearance and let her know that we are going to work pt in this week or the beginning of next week.

## 2019-03-30 NOTE — Telephone Encounter (Signed)
Spoke with pt and she is scheduled for Thursday at 12:30 per Dr. Derrel Nip.

## 2019-03-30 NOTE — Telephone Encounter (Signed)
Elizabeth from [facility] said she faxed over surgical clearance for patient on 03/18/18 and want to know if we received it. She said she will refax today and would like a call back.  [phone]

## 2019-03-30 NOTE — Telephone Encounter (Signed)
Pt is scheduled for right knee replacement on 04/09/2019. Pt is needing a face to face appt before then for an EKG and labs. There is no available appts before then. Is there somewhere we can work her in?

## 2019-04-02 ENCOUNTER — Ambulatory Visit (INDEPENDENT_AMBULATORY_CARE_PROVIDER_SITE_OTHER): Payer: PPO | Admitting: Internal Medicine

## 2019-04-02 ENCOUNTER — Other Ambulatory Visit: Payer: Self-pay

## 2019-04-02 ENCOUNTER — Encounter: Payer: Self-pay | Admitting: Internal Medicine

## 2019-04-02 ENCOUNTER — Ambulatory Visit (INDEPENDENT_AMBULATORY_CARE_PROVIDER_SITE_OTHER): Payer: PPO

## 2019-04-02 VITALS — BP 134/80 | HR 61 | Temp 96.8°F | Resp 16 | Ht 62.0 in | Wt 188.0 lb

## 2019-04-02 DIAGNOSIS — Z01818 Encounter for other preprocedural examination: Secondary | ICD-10-CM

## 2019-04-02 DIAGNOSIS — I10 Essential (primary) hypertension: Secondary | ICD-10-CM

## 2019-04-02 DIAGNOSIS — F419 Anxiety disorder, unspecified: Secondary | ICD-10-CM | POA: Insufficient documentation

## 2019-04-02 DIAGNOSIS — I2510 Atherosclerotic heart disease of native coronary artery without angina pectoris: Secondary | ICD-10-CM | POA: Diagnosis not present

## 2019-04-02 DIAGNOSIS — M25561 Pain in right knee: Secondary | ICD-10-CM

## 2019-04-02 DIAGNOSIS — E034 Atrophy of thyroid (acquired): Secondary | ICD-10-CM | POA: Diagnosis not present

## 2019-04-02 DIAGNOSIS — E782 Mixed hyperlipidemia: Secondary | ICD-10-CM | POA: Diagnosis not present

## 2019-04-02 LAB — COMPREHENSIVE METABOLIC PANEL
ALT: 9 U/L (ref 0–35)
AST: 15 U/L (ref 0–37)
Albumin: 3.7 g/dL (ref 3.5–5.2)
Alkaline Phosphatase: 66 U/L (ref 39–117)
BUN: 16 mg/dL (ref 6–23)
CO2: 31 mEq/L (ref 19–32)
Calcium: 9.5 mg/dL (ref 8.4–10.5)
Chloride: 103 mEq/L (ref 96–112)
Creatinine, Ser: 0.7 mg/dL (ref 0.40–1.20)
GFR: 81.11 mL/min (ref >60.00)
Glucose, Bld: 92 mg/dL (ref 70–99)
Potassium: 4.2 mEq/L (ref 3.5–5.1)
Sodium: 139 mEq/L (ref 135–145)
Total Bilirubin: 0.4 mg/dL (ref 0.2–1.2)
Total Protein: 6 g/dL (ref 6.0–8.3)

## 2019-04-02 LAB — CBC WITH DIFFERENTIAL/PLATELET
Basophils Absolute: 0.1 10*3/uL (ref 0.0–0.1)
Basophils Relative: 0.8 % (ref 0.0–3.0)
Eosinophils Absolute: 0.1 10*3/uL (ref 0.0–0.7)
Eosinophils Relative: 0.7 % (ref 0.0–5.0)
HCT: 44.2 % (ref 36.0–46.0)
Hemoglobin: 14.8 g/dL (ref 12.0–15.0)
Lymphocytes Relative: 25.2 % (ref 12.0–46.0)
Lymphs Abs: 2.2 10*3/uL (ref 0.7–4.0)
MCHC: 33.5 g/dL (ref 30.0–36.0)
MCV: 92.2 fl (ref 78.0–100.0)
Monocytes Absolute: 0.6 10*3/uL (ref 0.1–1.0)
Monocytes Relative: 6.8 % (ref 3.0–12.0)
Neutro Abs: 5.8 10*3/uL (ref 1.4–7.7)
Neutrophils Relative %: 66.5 % (ref 43.0–77.0)
Platelets: 290 10*3/uL (ref 150.0–400.0)
RBC: 4.79 Mil/uL (ref 3.87–5.11)
RDW: 12.5 % (ref 11.5–15.5)
WBC: 8.7 10*3/uL (ref 4.0–10.5)

## 2019-04-02 LAB — LIPID PANEL
Cholesterol: 213 mg/dL — ABNORMAL HIGH (ref 0–200)
HDL: 53 mg/dL (ref >39.00)
LDL Cholesterol: 123 mg/dL — ABNORMAL HIGH (ref 0–99)
NonHDL: 160.11
Total CHOL/HDL Ratio: 4
Triglycerides: 187 mg/dL — ABNORMAL HIGH (ref 0.0–149.0)
VLDL: 37.4 mg/dL (ref 0.0–40.0)

## 2019-04-02 LAB — HEMOGLOBIN A1C: Hgb A1c MFr Bld: 5.1 % (ref 4.6–6.5)

## 2019-04-02 LAB — TSH: TSH: 0.24 u[IU]/mL — ABNORMAL LOW (ref 0.35–4.50)

## 2019-04-02 LAB — LDL CHOLESTEROL, DIRECT: Direct LDL: 133 mg/dL

## 2019-04-02 MED ORDER — ALPRAZOLAM 0.5 MG PO TABS: 0.5000 mg | ORAL_TABLET | Freq: Every evening | ORAL | 3 refills | Status: DC | PRN

## 2019-04-02 NOTE — Progress Notes (Addendum)
Subjective:  Patient ID: Meagan Cox, female    DOB: July 14, 1940  Age: 79 y.o. MRN: [id number]  CC: The primary encounter diagnosis was Pre-op examination. Diagnoses of Mixed hyperlipidemia, Anxiety, Hypothyroidism due to acquired atrophy of thyroid, Right medial knee pain, Essential hypertension, and Coronary artery disease involving native heart without angina pectoris, unspecified vessel or lesion type were also pertinent to this visit.  HPI Meagan Cox presents for preoperative evaluation for elective right knee replacement scheduled for April 09 2019 by Kurtis Bushman,  To be done with regional anesthesia   She has noncritical CAD by remote cardiac catheterization over 5 years ago.  Has categorically declined  Cardiology follow up and has been symptom free.  No longer taking a statin due to statin intolerance   No history of diabetes or CKD  She denies a history  Of chest pain , shortness of breath.   No history of anesthesia intolerance.   No results found for: HGBA1C   Outpatient Medications Prior to Visit  Medication Sig Dispense Refill  . estradiol (ESTRACE) 2 MG tablet TAKE 1 TABLET(2 MG) BY MOUTH DAILY 90 tablet 1  . levothyroxine (SYNTHROID) 112 MCG tablet TAKE 1 TABLET(112 MCG) BY MOUTH DAILY. Please call office to schedule a follow-up appt, 90 tablet 0  . metoprolol succinate (TOPROL-XL) 25 MG 24 hr tablet Take 1 tablet (25 mg total) by mouth daily. 90 tablet 1  . Multiple Vitamin (MULTIVITAMIN) tablet Take 1 tablet by mouth daily.      Marland Kitchen ALPRAZolam (XANAX) 0.5 MG tablet Take 1 tablet (0.5 mg total) by mouth at bedtime as needed. 30 tablet 3  . docusate sodium (COLACE) 100 MG capsule Take 1 capsule (100 mg total) by mouth daily as needed. (Patient not taking: Reported on 04/02/2019) 30 capsule 2  . diclofenac (CATAFLAM) 50 MG tablet TAKE ONE TABLET BY MOUTH TWICE DAILY (Patient not taking: Reported on 08/25/2018) 180 tablet 1  . HYDROcodone-acetaminophen  (NORCO/VICODIN) 5-325 MG tablet Take 1 tablet by mouth every 6 (six) hours as needed for moderate pain. (Patient not taking: Reported on 04/02/2019) 20 tablet 0  . Omega-3 Fatty Acids (OMEGA-3 PO) Take 2 capsules by mouth daily.    . typhoid (VIVOTIF) DR capsule Take 1 capsule by mouth every other day. (Patient not taking: Reported on 08/25/2018) 4 capsule 0   No facility-administered medications prior to visit.    Review of Systems;  Patient denies headache, fevers, malaise, unintentional weight loss, skin rash, eye pain, sinus congestion and sinus pain, sore throat, dysphagia,  hemoptysis , cough, dyspnea, wheezing, chest pain, palpitations, orthopnea, edema, abdominal pain, nausea, melena, diarrhea, constipation, flank pain, dysuria, hematuria, urinary  Frequency, nocturia, numbness, tingling, seizures,  Focal weakness, Loss of consciousness,  Tremor, insomnia, depression, anxiety, and suicidal ideation.      Objective:  BP 134/80 (BP Location: Left Arm, Patient Position: Sitting, Cuff Size: Normal)   Pulse 61   Temp (!) 96.8 F (36 C) (Temporal)   Resp 16   Ht 5\' 2"  (1.575 m)   Wt 188 lb (85.3 kg)   SpO2 98%   BMI 34.39 kg/m   BP Readings from Last 3 Encounters:  04/02/19 134/80  08/28/18 (!) 167/75  07/04/18 (!) 160/110    Wt Readings from Last 3 Encounters:  04/02/19 188 lb (85.3 kg)  08/28/18 197 lb 8 oz (89.6 kg)  07/04/18 202 lb 12.8 oz (92 kg)    General appearance: alert, cooperative and appears stated age  Ears: normal TM's and external ear canals both ears Throat: lips, mucosa, and tongue normal; teeth and gums normal Neck: no adenopathy, no carotid bruit, supple, symmetrical, trachea midline and thyroid not enlarged, symmetric, no tenderness/mass/nodules Back: symmetric, no curvature. ROM normal. No CVA tenderness. Lungs: clear to auscultation bilaterally Heart: regular rate and rhythm, S1, S2 normal, no murmur, click, rub or gallop Abdomen: soft, non-tender;  bowel sounds normal; no masses,  no organomegaly Pulses: 2+ and symmetric Skin: Skin color, texture, turgor normal. No rashes or lesions Lymph nodes: Cervical, supraclavicular, and axillary nodes normal.  No results found for: HGBA1C  Lab Results  Component Value Date   CREATININE 0.75 08/25/2018   CREATININE 0.69 06/27/2018   CREATININE 0.63 10/17/2017    Lab Results  Component Value Date   WBC 17.1 (H) 08/25/2018   HGB 15.0 08/25/2018   HCT 45.5 08/25/2018   PLT 302 08/25/2018   GLUCOSE 89 08/25/2018   CHOL 213 (H) 03/04/2017   TRIG 200.0 (H) 03/04/2017   HDL 60.60 03/04/2017   LDLDIRECT 146.0 07/18/2015   LDLCALC 113 (H) 03/04/2017   ALT 8 06/27/2018   AST 13 06/27/2018   NA 139 08/25/2018   K 4.3 08/25/2018   CL 105 08/25/2018   CREATININE 0.75 08/25/2018   BUN 24 (H) 08/25/2018   CO2 25 08/25/2018   TSH 0.87 06/27/2018    No results found.  Assessment & Plan:   Problem List Items Addressed This Visit      Unprioritized   CAD (coronary artery disease)    Reported by patient as non critical by a remote cardiac catheterization nearly ten years ago.  With no interim evaluation per patient preference (one office visit in 2012 by Dr Rockey Situ) she is asymptomatic .  I have reviewed her EKG from today and compared it with a previous one and find that she is in sinus rhythm with no evidence of arrhythmia or ischemia.  Continue beta blocker ,  consider adding PCSK9 inhibitor if LDL is > 70 given her statin intolerance/       Hyperlipidemia   Relevant Orders   Lipid panel   Direct LDL   Hypothyroidism      Thyroid function has been  normal on 112 mcg  Dose.   Repeat is pending      Relevant Orders   TSH   Right medial knee pain    She has persistent pain that is limiting her recreational and physical activities despite arthroscopy with meniscectomy.  For elective TKR march 11       Essential hypertension    Continue  Toprol  XL given history of CAD        Anxiety   Relevant Medications   ALPRAZolam (XANAX) 0.5 MG tablet   Pre-op examination - Primary    She is at low to  moderate risk for perioperative complications based only on her past history of noncritical CAD by remote catheterization.  I see no ischemic changes on her EKG done today.  Would continue beta blocker perioperatively.  Chest x ray and labs are pending       Relevant Orders   EKG 12-Lead (Completed)   Hemoglobin A1c   CBC with Differential/Platelet   Comprehensive metabolic panel   DG Chest 2 View      I have discontinued Elnita Maxwell. Urizar's diclofenac, Omega-3 Fatty Acids (OMEGA-3 PO), typhoid, and HYDROcodone-acetaminophen. I am also having her maintain her multivitamin, docusate sodium, metoprolol succinate, estradiol, levothyroxine, and  ALPRAZolam.  Meds ordered this encounter  Medications  . ALPRAZolam (XANAX) 0.5 MG tablet    Sig: Take 1 tablet (0.5 mg total) by mouth at bedtime as needed.    Dispense:  30 tablet    Refill:  3    Medications Discontinued During This Encounter  Medication Reason  . diclofenac (CATAFLAM) 50 MG tablet Patient has not taken in last 30 days  . HYDROcodone-acetaminophen (NORCO/VICODIN) 5-325 MG tablet Patient has not taken in last 30 days  . Omega-3 Fatty Acids (OMEGA-3 PO) Patient Preference  . typhoid (VIVOTIF) DR capsule Error  . ALPRAZolam (XANAX) 0.5 MG tablet Reorder   I provided  30 minutes of face-to-face time during this encounter reviewing patient's current problems and past surgeries, labs and imaging studies, providing counseling on the above mentioned problems , and coordination  of care .  Follow-up: No follow-ups on file.   Crecencio Mc, MD

## 2019-04-02 NOTE — Assessment & Plan Note (Addendum)
She has persistent pain that is limiting her recreational and physical activities despite arthroscopy with meniscectomy.  For elective TKR march 11

## 2019-04-02 NOTE — Assessment & Plan Note (Signed)
Thyroid function has been  normal on 112 mcg  Dose.   Repeat is pending

## 2019-04-02 NOTE — Patient Instructions (Signed)
I have refilled your alprazolam  I have ordered labs and an x ray for your preoperative evaluation

## 2019-04-02 NOTE — Assessment & Plan Note (Addendum)
Continue  Toprol  XL given history of CAD

## 2019-04-02 NOTE — Assessment & Plan Note (Addendum)
Reported by patient as non critical by a remote cardiac catheterization nearly ten years ago.  With no interim evaluation per patient preference (one office visit in 2012 by Dr Rockey Situ) she is asymptomatic .  I have reviewed her EKG from today and compared it with a previous one and find that she is in sinus rhythm with no evidence of arrhythmia or ischemia.  Continue beta blocker ,  consider adding PCSK9 inhibitor if LDL is > 70 given her statin intolerance/

## 2019-04-02 NOTE — Assessment & Plan Note (Addendum)
She is at low to  moderate risk for perioperative complications based only on her past history of noncritical CAD by remote catheterization.  I see no ischemic changes on her EKG done today.  Would continue beta blocker perioperatively.  Chest x ray and labs are pending

## 2019-04-03 ENCOUNTER — Other Ambulatory Visit: Payer: Self-pay | Admitting: Internal Medicine

## 2019-04-03 MED ORDER — LEVOTHYROXINE SODIUM 100 MCG PO TABS: 100.0000 ug | ORAL_TABLET | Freq: Every day | ORAL | 3 refills | Status: DC

## 2019-04-07 DIAGNOSIS — Z01818 Encounter for other preprocedural examination: Secondary | ICD-10-CM | POA: Diagnosis not present

## 2019-04-07 DIAGNOSIS — M1711 Unilateral primary osteoarthritis, right knee: Secondary | ICD-10-CM | POA: Diagnosis not present

## 2019-04-07 DIAGNOSIS — Z20822 Contact with and (suspected) exposure to covid-19: Secondary | ICD-10-CM | POA: Diagnosis not present

## 2019-04-07 DIAGNOSIS — Z01812 Encounter for preprocedural laboratory examination: Secondary | ICD-10-CM | POA: Diagnosis not present

## 2019-04-09 DIAGNOSIS — M1711 Unilateral primary osteoarthritis, right knee: Secondary | ICD-10-CM | POA: Diagnosis not present

## 2019-04-09 DIAGNOSIS — F419 Anxiety disorder, unspecified: Secondary | ICD-10-CM | POA: Diagnosis not present

## 2019-04-09 DIAGNOSIS — Z96651 Presence of right artificial knee joint: Secondary | ICD-10-CM | POA: Diagnosis not present

## 2019-04-09 DIAGNOSIS — Z888 Allergy status to other drugs, medicaments and biological substances status: Secondary | ICD-10-CM | POA: Diagnosis not present

## 2019-04-09 DIAGNOSIS — Z79890 Hormone replacement therapy (postmenopausal): Secondary | ICD-10-CM | POA: Diagnosis not present

## 2019-04-09 DIAGNOSIS — G8918 Other acute postprocedural pain: Secondary | ICD-10-CM | POA: Diagnosis not present

## 2019-04-09 DIAGNOSIS — G4733 Obstructive sleep apnea (adult) (pediatric): Secondary | ICD-10-CM | POA: Diagnosis not present

## 2019-04-09 DIAGNOSIS — M25561 Pain in right knee: Secondary | ICD-10-CM | POA: Diagnosis not present

## 2019-04-09 DIAGNOSIS — I10 Essential (primary) hypertension: Secondary | ICD-10-CM | POA: Diagnosis not present

## 2019-04-09 DIAGNOSIS — I2510 Atherosclerotic heart disease of native coronary artery without angina pectoris: Secondary | ICD-10-CM | POA: Diagnosis not present

## 2019-04-09 DIAGNOSIS — Z87891 Personal history of nicotine dependence: Secondary | ICD-10-CM | POA: Diagnosis not present

## 2019-04-09 DIAGNOSIS — E039 Hypothyroidism, unspecified: Secondary | ICD-10-CM | POA: Diagnosis not present

## 2019-04-09 DIAGNOSIS — Z880 Allergy status to penicillin: Secondary | ICD-10-CM | POA: Diagnosis not present

## 2019-04-23 ENCOUNTER — Other Ambulatory Visit: Payer: Self-pay

## 2019-04-23 ENCOUNTER — Ambulatory Visit: Payer: PPO | Attending: Orthopedic Surgery | Admitting: Physical Therapy

## 2019-04-23 DIAGNOSIS — M25661 Stiffness of right knee, not elsewhere classified: Secondary | ICD-10-CM | POA: Diagnosis not present

## 2019-04-23 DIAGNOSIS — M25561 Pain in right knee: Secondary | ICD-10-CM | POA: Diagnosis not present

## 2019-04-23 DIAGNOSIS — G8929 Other chronic pain: Secondary | ICD-10-CM | POA: Insufficient documentation

## 2019-04-23 DIAGNOSIS — R269 Unspecified abnormalities of gait and mobility: Secondary | ICD-10-CM | POA: Diagnosis not present

## 2019-04-23 DIAGNOSIS — Z96651 Presence of right artificial knee joint: Secondary | ICD-10-CM | POA: Diagnosis not present

## 2019-04-23 DIAGNOSIS — M6281 Muscle weakness (generalized): Secondary | ICD-10-CM | POA: Insufficient documentation

## 2019-04-23 NOTE — Patient Instructions (Signed)
Access Code: [email or web address]: [email or web address]: 03/25/2021Prepared by: Legrand Como SherkExercises  Supine Heel Slides - 1 x daily - 7 x weekly - 2 sets - 10 reps  Supine Quad Set - 1 x daily - 7 x weekly - 2 sets - 10 reps  Straight Leg Raise - 1 x daily - 7 x weekly - 2 sets - 10 reps  Seated Knee Flexion Extension AROM - 1 x daily - 7 x weekly - 2 sets - 10 reps  Standing March with Counter Support - 1 x daily - 7 x weekly - 2 sets - 10 reps  Standing Heel Raise with Support - 1 x daily - 7 x weekly - 2 sets - 10 reps  Long Sitting 4 Way Patellar Glide - 1 x daily - 7 x weekly - 1 sets - 10 reps

## 2019-04-25 NOTE — Therapy (Signed)
[facility] [address]. [place], [place], [address] Phone: [phone]   Fax:  [phone]  Physical Therapy Evaluation  Patient Details  Name: Meagan Cox MRN: [id number] Date of Birth: 21-May-1940 Referring Provider (PT): Dr. Harlow Mares   Encounter Date: 04/23/2019  PT End of Session - 04/25/19 1000    Visit Number  1    Number of Visits  9    Date for PT Re-Evaluation  05/21/19    Authorization - Visit Number  1    Authorization - Number of Visits  10    PT Start Time  1024    PT Stop Time  1118    PT Time Calculation (min)  54 min    Activity Tolerance  Patient tolerated treatment well;Patient limited by pain    Behavior During Therapy  [facility] for tasks assessed/performed       Past Medical History:  Diagnosis Date  . Hyperlipidemia   . Hypertension   . Hypothyroidism     Past Surgical History:  Procedure Laterality Date  . ABDOMINAL HYSTERECTOMY    . CARDIAC CATHETERIZATION     30 years ago, [facility]  . CHOLECYSTECTOMY N/A 08/21/2015   Procedure: LAPAROSCOPIC CHOLECYSTECTOMY;  Surgeon: Jules Husbands, MD;  Location: [facility];  Service: General;  Laterality: N/A;  . EYE SURGERY     eye lid lift  . KNEE ARTHROSCOPY Right 08/28/2018   Procedure: ARTHROSCOPY KNEE;  Surgeon: Lovell Sheehan, MD;  Location: [facility];  Service: Orthopedics;  Laterality: Right;  . PARTIAL HYSTERECTOMY    . TONSILLECTOMY      There were no vitals filed for this visit.   Subjective Assessment - 04/25/19 0954    Subjective  Pt. s/p R TKA on 03/31/2019.  Pt. reports 1/10 R knee pain at rest and reports she is doing well overall.  Pt. has steristrips in place at time of evaluation.  Pt. has trip planned for [facility] in April.  No sleeping well at this time.  Pt. lives with daughter is currently use of QC for walking (PT instructed pt. to use QC on L side).    Pertinent History  Pt is a 79 y.o. female 3 months s/p R knee  arthoscopy on 08/28/2018. 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 The ACL showed partial tearing of less than 50% and the PCL was intact. There was mild lateral subluxation of the patella.  Pt is sedentary and reports she does not like PT and is only coming per doctor's request.  Pt received shot of cortisone 2 weeks ago and has had no pain since.  Swelling progressively worsens throughout day.  Pt states she is a frequent faller.  Pt is inactive and expresses reluctance to activity/ exercises.  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  NA    Patient Stated Goals  Increase R knee ROM/ strength/ improve pain-free mobility.    Currently in Pain?  Yes    Pain Score  1     Pain Location  Knee    Pain Orientation  Right    Pain Descriptors / Indicators  Aching  [facility] PT Assessment - 04/25/19 0001      Assessment   Medical Diagnosis  S/p R TKA    Referring Provider (PT)  Dr. Harlow Mares    Onset Date/Surgical Date  03/31/19    Prior Therapy  Yes, known to PT      Prior Function   Level of Independence  Independent      Cognition   Overall Cognitive Status  Within Functional Limits for tasks assessed          See flowsheet      Objective measurements completed on examination: See above findings.      See HEP    PT Education - 04/25/19 0959    Education Details  See HEP    Person(s) Educated  Patient    Methods  Explanation;Demonstration;Handout    Comprehension  Verbalized understanding;Returned demonstration          PT Long Term Goals - 04/25/19 1011      PT LONG TERM GOAL #1   Title  Pt will increase FOTO score to goal to improve  functional mobility.    Baseline  See flowsheet    Time  4    Period  Weeks    Status  New    Target Date  05/21/19      PT LONG TERM GOAL #2   Title  Pt will be independent with HEP to increase R knee AROM (0 to 115 deg.)  to improve pain-free function at home.    Baseline  R knee AROM: -7 to 87 deg.    Time  4    Period  Weeks    Status  New    Target Date  05/21/19      PT LONG TERM GOAL #3   Title  Pt will increase strength of B LE 1/2 MMT grade in order to demonstrate improvement in strength and function.    Baseline  Pt. demonstrates the following muscular strength deficits MMT: Hip flexion L 4/5, R 4/5, Knee flexion L 4+/5, R 4/5, Knee extension L 4/5, R 4/5.    Time  4    Period  Weeks    Status  New    Target Date  05/21/19      PT LONG TERM GOAL #4   Title  Pt. able to ascend/ descend stairs wtih recip. pattern and no increase c/o R knee pain.    Baseline  Pain limited with stair climbing.    Time  4    Period  Weeks    Status  Not Met    Target Date  05/21/19      PT LONG TERM GOAL #5   Title  Pt. will ambulate with normalized gait pattern and no assistive device safely community distances.    Baseline  Moderate R antalgic gait with use of QC.  PT instructed use of QC on L side.    Time  4    Period  Weeks    Status  New    Target Date  05/21/19         Plan - 04/25/19 0953    Clinical Impression Statement  Pt. is a pleasant 79 y/o female S/P R TKA on 03/31/19. Pt. reports pain: Now 1/10, Best 1/10, and Worst 9/10 and is currenlty taking medication as prescribed for pain control. Pt. has defictis in R knee AROM: AROM (-7 to 87 deg.).  L knee (0 to 136 deg.).  Girth Measurements: L/R joint line (  39/42 cm.), 2" above patella (45/42.5 cm.), Mid-gastroc (35.5/38.5 cm.).  Pt. demonstrates the following muscular strength deficits MMT: Hip flexion L 4/5, R 4/5, Knee flexion L 4+/5, R 4/5, Knee extension L 4/5, R 4/5.  Pt. ambulates with mod. R antalgic gait pattern with  use of QC. FOTO completed.  Pt. will benefit from skilled PT services to address pain, strength, and ROM deficits to return to ADLs and IADLs and to improve QOL.    Stability/Clinical Decision Making  Stable/Uncomplicated    Clinical Decision Making  Low    Rehab Potential  Good    PT Frequency  2x / week    PT Duration  4 weeks    PT Treatment/Interventions  ADLs/Self Care Home Management;Aquatic Therapy;Gait training;Traction;Moist Heat;Stair training;Functional mobility training;Therapeutic activities;Therapeutic exercise;Balance training;Neuromuscular re-education;Electrical Stimulation;Cryotherapy;Manual techniques;Patient/family education;Passive range of motion;Dry needling;Energy conservation;Ultrasound;Splinting;Taping;Joint Manipulations;Spinal Manipulations;Scar mobilization    PT Next Visit Plan  Progress R knee ROM    PT Home Exercise Plan  [email or web address]       Patient will benefit from skilled therapeutic intervention in order to improve the following deficits and impairments:  Abnormal gait, Pain, Improper body mechanics, Postural dysfunction, Other (comment), Decreased mobility, Decreased coordination, Decreased activity tolerance, Decreased endurance, Decreased balance, Decreased safety awareness, Impaired flexibility, Decreased range of motion, Obesity, Decreased strength, Cardiopulmonary status limiting activity, Difficulty walking, Decreased scar mobility, Hypomobility, Increased fascial restricitons  Visit Diagnosis: Status post right knee replacement  Muscle weakness (generalized)  Joint stiffness of knee, right  Gait difficulty     Problem List Patient Active Problem List   Diagnosis Date Noted  . Anxiety 04/02/2019  . Pre-op examination 04/02/2019  . Orthostasis 07/06/2018  . Travel advice encounter 10/19/2017  . Loss of perception for taste 10/19/2017  . Vertigo 10/19/2017  . Essential hypertension 03/05/2017  . S/P laparoscopic cholecystectomy 03/05/2017  .  Ptosis, both eyelids 01/04/2017  . Medicare annual wellness visit, subsequent 07/24/2015  . Cerebrovascular small vessel disease 04/24/2015  . Benign paroxysmal positional vertigo 04/24/2015  . Right medial knee pain 04/24/2015  . Counseling for travel 01/11/2015  . Shoulder pain, right 11/22/2013  . Statin intolerance 10/08/2012  . Edema 05/14/2012  . Generalized anxiety disorder 01/06/2012  . 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 Hypothyroidism 12/18/2010  . Low back pain radiating to both legs 12/18/2010  . Hip pain, right 12/18/2010  . Obesity 05/09/2010  . CAD (coronary artery disease) 05/09/2010  . Hyperlipidemia 05/09/2010  . Hypertension 05/09/2010   Pura Spice, PT, DPT # [phone] 04/25/2019, 10:18 AM  [facility] [address] [place], [place], [address] Phone: [phone]   Fax:  [phone]  Name: Meagan Cox MRN: [id number] Date of Birth: 1940-12-10

## 2019-04-29 ENCOUNTER — Other Ambulatory Visit: Payer: Self-pay

## 2019-04-29 ENCOUNTER — Encounter: Payer: Self-pay | Admitting: Physical Therapy

## 2019-04-29 ENCOUNTER — Ambulatory Visit: Payer: PPO | Admitting: Physical Therapy

## 2019-04-29 DIAGNOSIS — Z96651 Presence of right artificial knee joint: Secondary | ICD-10-CM

## 2019-04-29 DIAGNOSIS — M25661 Stiffness of right knee, not elsewhere classified: Secondary | ICD-10-CM

## 2019-04-29 DIAGNOSIS — R269 Unspecified abnormalities of gait and mobility: Secondary | ICD-10-CM

## 2019-04-29 DIAGNOSIS — M6281 Muscle weakness (generalized): Secondary | ICD-10-CM

## 2019-04-29 DIAGNOSIS — G8929 Other chronic pain: Secondary | ICD-10-CM

## 2019-04-29 NOTE — Therapy (Signed)
[facility] [address]. [place], [place], [address] Phone: [phone]   Fax:  [phone]  Physical Therapy Treatment  Patient Details  Name: Meagan Cox MRN: [id number] Date of Birth: May 20, 1940 Referring Provider (PT): Dr. Harlow Mares   Encounter Date: 04/29/2019  PT End of Session - 04/29/19 1106    Visit Number  2    Number of Visits  9    Date for PT Re-Evaluation  05/21/19    Authorization - Visit Number  2    Authorization - Number of Visits  10    PT Start Time  0814    PT Stop Time  0905    PT Time Calculation (min)  51 min    Activity Tolerance  Patient tolerated treatment well;Patient limited by pain    Behavior During Therapy  [facility] for tasks assessed/performed       Past Medical History:  Diagnosis Date  . Hyperlipidemia   . Hypertension   . Hypothyroidism     Past Surgical History:  Procedure Laterality Date  . ABDOMINAL HYSTERECTOMY    . CARDIAC CATHETERIZATION     30 years ago, [place]  . CHOLECYSTECTOMY N/A 08/21/2015   Procedure: LAPAROSCOPIC CHOLECYSTECTOMY;  Surgeon: Jules Husbands, MD;  Location: [facility];  Service: General;  Laterality: N/A;  . EYE SURGERY     eye lid lift  . KNEE ARTHROSCOPY Right 08/28/2018   Procedure: ARTHROSCOPY KNEE;  Surgeon: Lovell Sheehan, MD;  Location: [place];  Service: Orthopedics;  Laterality: Right;  . PARTIAL HYSTERECTOMY    . TONSILLECTOMY      There were no vitals filed for this visit.  Subjective Assessment - 04/29/19 0823    Subjective  Pt. states she is doing well.  Pt. reports no pain but the occasional "sharp" nerve pain in R knee joint.  Pt. reports inconsistent compliance with and is aware of the importance to focus on R knee extension/ ROM.  Pt. entered PT with use of QC on L side and 2-point gait pattern.    Pertinent History  Pt is a 79 y.o. female 3 months s/p R knee arthoscopy on 08/28/2018. 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 The ACL showed partial tearing of less than 50% and the PCL was intact. There was mild lateral subluxation of the patella.  Pt is sedentary and reports she does not like PT and is only coming per doctor's request.  Pt received shot of cortisone 2 weeks ago and has had no pain since.  Swelling progressively worsens throughout day.  Pt states she is a frequent faller.  Pt is inactive and expresses reluctance to activity/ exercises.  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  NA    Patient Stated Goals  Increase R knee ROM/ strength/ improve pain-free mobility.    Currently in Pain?  No/denies       There.ex.:  Discussed HEP/ reviewed patellar mobs Nustep L4 10 min. B UE/LE (consistent cadence/no increase knee pain) Walking in //-bars forward/ lateral with R UE assist and mirror feedback. Seated marching/ LAQ/ standing heel raises (15x)- limited by muscle fatigue Sit to stands 10x from gray chair (min.  To no UE assist) Supine heel slides/ quad sets (manual feedback)/ SLR 10x each  Manual tx.:  Supine R hamstring/ quad stretches (R knee AA/PROM)- 6x each with static holds.  Marked increase in R knee AROM (-4 to 101 deg.).  Tactile/ verbal cuing to maintain R knee in neutral position Supine R patellar mobs (all planes) STM to R distal hamstring/ knee joint line.    Pt. Instructed to ice at home.      PT Long Term Goals - 04/25/19 1011      PT LONG TERM GOAL #1   Title  Pt will increase FOTO score to goal to improve functional mobility.    Baseline  See flowsheet    Time  4    Period  Weeks    Status  New    Target Date  05/21/19      PT LONG TERM GOAL #2   Title  Pt  will be independent with HEP to increase R knee AROM (0 to 115 deg.)  to improve pain-free function at home.    Baseline  R knee AROM: -7 to 87 deg.    Time  4    Period  Weeks    Status  New    Target Date  05/21/19      PT LONG TERM GOAL #3   Title  Pt will increase strength of B LE 1/2 MMT grade in order to demonstrate improvement in strength and function.    Baseline  Pt. demonstrates the following muscular strength deficits MMT: Hip flexion L 4/5, R 4/5, Knee flexion L 4+/5, R 4/5, Knee extension L 4/5, R 4/5.    Time  4    Period  Weeks    Status  New    Target Date  05/21/19      PT LONG TERM GOAL #4   Title  Pt. able to ascend/ descend stairs wtih recip. pattern and no increase c/o R knee pain.    Baseline  Pain limited with stair climbing.    Time  4    Period  Weeks    Status  Not Met    Target Date  05/21/19      PT LONG TERM GOAL #5   Title  Pt. will ambulate with normalized gait pattern and no assistive device safely community distances.    Baseline  Moderate R antalgic gait with use of QC.  PT instructed use of QC on L side.    Time  4    Period  Weeks    Status  New    Target Date  05/21/19            Plan - 04/29/19 1107    Clinical Impression Statement  Pt. limited with R distal hamstring/ knee pain in supine knee extension.  Pt. has difficulty keeping knee in midline position during supine ex./ Nustep.  Marked increase in R knee AROM after manual stretches (-4 to 101 deg.).  Moderate R hip/quad muscle fatigue and weakness after standing and supine ther.ex.  Pt. able to ambulate with consistent 2-point gait pattern with benefit of QC after tx. session.  Minimal R knee swelling and steristrips still in place at this time.  PT reviewed patellar mobs for pt. to complete at home.    Stability/Clinical Decision Making  Stable/Uncomplicated    Clinical Decision Making  Low    Rehab Potential  Good    PT Frequency  2x / week    PT  Duration  4 weeks    PT  Treatment/Interventions  ADLs/Self Care Home Management;Aquatic Therapy;Gait training;Traction;Moist Heat;Stair training;Functional mobility training;Therapeutic activities;Therapeutic exercise;Balance training;Neuromuscular re-education;Electrical Stimulation;Cryotherapy;Manual techniques;Patient/family education;Passive range of motion;Dry needling;Energy conservation;Ultrasound;Splinting;Taping;Joint Manipulations;Spinal Manipulations;Scar mobilization    PT Next Visit Plan  Progress R knee ROM/ quad stability.    PT Home Exercise Plan  [id number]       Patient will benefit from skilled therapeutic intervention in order to improve the following deficits and impairments:  Abnormal gait, Pain, Improper body mechanics, Postural dysfunction, Other (comment), Decreased mobility, Decreased coordination, Decreased activity tolerance, Decreased endurance, Decreased balance, Decreased safety awareness, Impaired flexibility, Decreased range of motion, Obesity, Decreased strength, Cardiopulmonary status limiting activity, Difficulty walking, Decreased scar mobility, Hypomobility, Increased fascial restricitons  Visit Diagnosis: Status post right knee replacement  Muscle weakness (generalized)  Joint stiffness of knee, right  Gait difficulty  Chronic pain of right knee     Problem List Patient Active Problem List   Diagnosis Date Noted  . Anxiety 04/02/2019  . Pre-op examination 04/02/2019  . Orthostasis 07/06/2018  . Travel advice encounter 10/19/2017  . Loss of perception for taste 10/19/2017  . Vertigo 10/19/2017  . Essential hypertension 03/05/2017  . S/P laparoscopic cholecystectomy 03/05/2017  . Ptosis, both eyelids 01/04/2017  . Medicare annual wellness visit, subsequent 07/24/2015  . Cerebrovascular small vessel disease 04/24/2015  . Benign paroxysmal positional vertigo 04/24/2015  . Right medial knee pain 04/24/2015  . Counseling for travel 01/11/2015  . Shoulder pain, right  11/22/2013  . Statin intolerance 10/08/2012  . Edema 05/14/2012  . Generalized anxiety disorder 01/06/2012  . 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 Hypothyroidism 12/18/2010  . Low back pain radiating to both legs 12/18/2010  . Hip pain, right 12/18/2010  . Obesity 05/09/2010  . CAD (coronary artery disease) 05/09/2010  . Hyperlipidemia 05/09/2010  . Hypertension 05/09/2010   Pura Spice, PT, DPT # [id number] 04/29/2019, 11:11 AM  [facility] [address] [place], [place], [address] Phone: [phone]   Fax:  [phone]  Name: Meagan Cox MRN: [id number] Date of Birth: 1940/03/16

## 2019-05-01 ENCOUNTER — Ambulatory Visit: Payer: PPO | Attending: Orthopedic Surgery | Admitting: Physical Therapy

## 2019-05-01 ENCOUNTER — Encounter: Payer: Self-pay | Admitting: Physical Therapy

## 2019-05-01 DIAGNOSIS — M6281 Muscle weakness (generalized): Secondary | ICD-10-CM | POA: Diagnosis not present

## 2019-05-01 DIAGNOSIS — Z96651 Presence of right artificial knee joint: Secondary | ICD-10-CM | POA: Diagnosis not present

## 2019-05-01 DIAGNOSIS — G8929 Other chronic pain: Secondary | ICD-10-CM

## 2019-05-01 DIAGNOSIS — M25561 Pain in right knee: Secondary | ICD-10-CM | POA: Insufficient documentation

## 2019-05-01 DIAGNOSIS — R269 Unspecified abnormalities of gait and mobility: Secondary | ICD-10-CM | POA: Diagnosis not present

## 2019-05-01 DIAGNOSIS — M25661 Stiffness of right knee, not elsewhere classified: Secondary | ICD-10-CM | POA: Diagnosis not present

## 2019-05-03 NOTE — Therapy (Signed)
[place] [facility] [address]. [place], [place], [address] Phone: [phone]   Fax:  [phone]  Physical Therapy Treatment  Patient Details  Name: Meagan Cox MRN: [id number] Date of Birth: [birth year] Referring Provider (PT): Dr. Harlow Mares   Encounter Date: 05/01/2019  PT End of Session - 05/03/19 1907    Visit Number  3    Number of Visits  9    Date for PT Re-Evaluation  05/21/19    Authorization - Visit Number  3    Authorization - Number of Visits  10    PT Start Time  0856    PT Stop Time  0947    PT Time Calculation (min)  51 min    Activity Tolerance  Patient tolerated treatment well;Patient limited by pain    Behavior During Therapy  [facility] for tasks assessed/performed       Past Medical History:  Diagnosis Date  . Hyperlipidemia   . Hypertension   . Hypothyroidism     Past Surgical History:  Procedure Laterality Date  . ABDOMINAL HYSTERECTOMY    . CARDIAC CATHETERIZATION     30 years ago, [place]  . CHOLECYSTECTOMY N/A 08/21/2015   Procedure: LAPAROSCOPIC CHOLECYSTECTOMY;  Surgeon: Jules Husbands, MD;  Location: [facility];  Service: General;  Laterality: N/A;  . EYE SURGERY     eye lid lift  . KNEE ARTHROSCOPY Right 08/28/2018   Procedure: ARTHROSCOPY KNEE;  Surgeon: Lovell Sheehan, MD;  Location: [place];  Service: Orthopedics;  Laterality: Right;  . PARTIAL HYSTERECTOMY    . TONSILLECTOMY      There were no vitals filed for this visit.  Subjective Assessment - 05/03/19 1905    Subjective  Pt. reports no knee pain prior to tx. session.  Pts. steristrips are starting to peel ups/ fall off.  No heat or redness in R knee noted.    Pertinent History  Pt is a 79 y.o. female 3 months s/p R knee arthoscopy on 08/28/2018. 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 The ACL showed partial tearing of less than 50% and the PCL was intact. There was mild lateral subluxation of the patella.  Pt is sedentary and reports she does not like PT and is only coming per doctor's request.  Pt received shot of cortisone 2 weeks ago and has had no pain since.  Swelling progressively worsens throughout day.  Pt states she is a frequent faller.  Pt is inactive and expresses reluctance to activity/ exercises.  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  NA    Patient Stated Goals  Increase R knee ROM/ strength/ improve pain-free mobility.    Currently in Pain?  No/denies       There.ex.:  Nustep L4 10 min. B UE/LE (consistent cadence/no increase knee pain) Walking in //-bars forward/ lateral with R UE assist and mirror feedback. Seated marching/ LAQ/ standing heel raises (20x).  Standing high marching 10x2. Sit to stands 10x from gray chair (min. To no UE assist) Supine heel slides/ quad sets (manual feedback)/ SLR 10x each  Manual tx.:  Supine R hamstring/ quad stretches (R knee AA/PROM)- 5x each with static holds.  Supine R patellar mobs (all planes) STM to R distal hamstring/ knee joint line during R knee ext. Stretch Scar massage (no lotion)- several steristrips in place (good healing).    Pt. Instructed to ice at home.       PT Long Term Goals - 04/25/19 1011      PT LONG TERM GOAL #1   Title  Pt will increase FOTO score to goal to improve functional mobility.    Baseline  See flowsheet    Time  4    Period  Weeks    Status  New    Target Date  05/21/19      PT LONG TERM GOAL #2   Title  Pt will be independent with HEP to increase R knee AROM (0 to 115 deg.)  to improve pain-free function at home.    Baseline  R knee AROM: -7 to 87 deg.    Time  4    Period  Weeks     Status  New    Target Date  05/21/19      PT LONG TERM GOAL #3   Title  Pt will increase strength of B LE 1/2 MMT grade in order to demonstrate improvement in strength and function.    Baseline  Pt. demonstrates the following muscular strength deficits MMT: Hip flexion L 4/5, R 4/5, Knee flexion L 4+/5, R 4/5, Knee extension L 4/5, R 4/5.    Time  4    Period  Weeks    Status  New    Target Date  05/21/19      PT LONG TERM GOAL #4   Title  Pt. able to ascend/ descend stairs wtih recip. pattern and no increase c/o R knee pain.    Baseline  Pain limited with stair climbing.    Time  4    Period  Weeks    Status  Not Met    Target Date  05/21/19      PT LONG TERM GOAL #5   Title  Pt. will ambulate with normalized gait pattern and no assistive device safely community distances.    Baseline  Moderate R antalgic gait with use of QC.  PT instructed use of QC on L side.    Time  4    Period  Weeks    Status  New    Target Date  05/21/19            Plan - 05/03/19 1907    Clinical Impression Statement  Pt. continues to ambulate with R antalgic gait and limited full knee extension.  Pt. benefit from use of SPC and 2-point gait pattern.  Pt. is able to demonstrate increase R knee extension in supine position but increase distal hamstring/ posterior knee pain.  Pt. prefers flexed knee position at rest.  Pt. works hard during tx. session and progressing with standing hip/knee ex. program.    Stability/Clinical Decision Making  Stable/Uncomplicated    Clinical Decision Making  Low    Rehab Potential  Good    PT Frequency  2x / week    PT Duration  4 weeks    PT Treatment/Interventions  ADLs/Self Care Home Management;Aquatic Therapy;Gait training;Traction;Moist Heat;Stair training;Functional mobility training;Therapeutic activities;Therapeutic exercise;Balance training;Neuromuscular re-education;Electrical Stimulation;Cryotherapy;Manual techniques;Patient/family education;Passive range of  motion;Dry needling;Energy conservation;Ultrasound;Splinting;Taping;Joint Manipulations;Spinal Manipulations;Scar mobilization    PT Next Visit Plan  Progress R knee ROM/ quad stability.   Reassess knee ROM    PT Home Exercise Plan  [id number]  Patient will benefit from skilled therapeutic intervention in order to improve the following deficits and impairments:  Abnormal gait, Pain, Improper body mechanics, Postural dysfunction, Other (comment), Decreased mobility, Decreased coordination, Decreased activity tolerance, Decreased endurance, Decreased balance, Decreased safety awareness, Impaired flexibility, Decreased range of motion, Obesity, Decreased strength, Cardiopulmonary status limiting activity, Difficulty walking, Decreased scar mobility, Hypomobility, Increased fascial restricitons  Visit Diagnosis: Status post right knee replacement  Muscle weakness (generalized)  Joint stiffness of knee, right  Gait difficulty  Chronic pain of right knee     Problem List Patient Active Problem List   Diagnosis Date Noted  . Anxiety 04/02/2019  . Pre-op examination 04/02/2019  . Orthostasis 07/06/2018  . Travel advice encounter 10/19/2017  . Loss of perception for taste 10/19/2017  . Vertigo 10/19/2017  . Essential hypertension 03/05/2017  . S/P laparoscopic cholecystectomy 03/05/2017  . Ptosis, both eyelids 01/04/2017  . Medicare annual wellness visit, subsequent 07/24/2015  . Cerebrovascular small vessel disease 04/24/2015  . Benign paroxysmal positional vertigo 04/24/2015  . Right medial knee pain 04/24/2015  . Counseling for travel 01/11/2015  . Shoulder pain, right 11/22/2013  . Statin intolerance 10/08/2012  . Edema 05/14/2012  . Generalized anxiety disorder 01/06/2012  . 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 Hypothyroidism 12/18/2010  . Low back pain radiating to both legs 12/18/2010  . Hip pain, right 12/18/2010  . Obesity 05/09/2010  . CAD (coronary artery disease)  05/09/2010  . Hyperlipidemia 05/09/2010  . Hypertension 05/09/2010   Pura Spice, PT, DPT # [id number] 05/03/2019, 7:12 PM  [facility] [address] [place], [place], [address] Phone: [phone]   Fax:  [phone]  Name: Meagan Cox MRN: [id number] Date of Birth: 05-Mar-1940

## 2019-05-05 DIAGNOSIS — H2513 Age-related nuclear cataract, bilateral: Secondary | ICD-10-CM | POA: Diagnosis not present

## 2019-05-06 ENCOUNTER — Encounter: Payer: Self-pay | Admitting: Physical Therapy

## 2019-05-06 ENCOUNTER — Ambulatory Visit: Payer: PPO | Admitting: Physical Therapy

## 2019-05-06 ENCOUNTER — Other Ambulatory Visit: Payer: Self-pay

## 2019-05-06 DIAGNOSIS — R269 Unspecified abnormalities of gait and mobility: Secondary | ICD-10-CM

## 2019-05-06 DIAGNOSIS — M6281 Muscle weakness (generalized): Secondary | ICD-10-CM

## 2019-05-06 DIAGNOSIS — M25661 Stiffness of right knee, not elsewhere classified: Secondary | ICD-10-CM

## 2019-05-06 DIAGNOSIS — Z96651 Presence of right artificial knee joint: Secondary | ICD-10-CM | POA: Diagnosis not present

## 2019-05-06 DIAGNOSIS — G8929 Other chronic pain: Secondary | ICD-10-CM

## 2019-05-06 NOTE — Therapy (Signed)
[facility] [address]. [place], [place], [address] Phone: [phone]   Fax:  [phone]  Physical Therapy Treatment  Patient Details  Name: Meagan Cox MRN: [id number] Date of Birth: [birth year] Referring Provider (PT): Dr. Harlow Mares   Encounter Date: 05/06/2019  PT End of Session - 05/06/19 0952    Visit Number  4    Number of Visits  9    Date for PT Re-Evaluation  05/21/19    Authorization - Visit Number  4    Authorization - Number of Visits  10    PT Start Time  0946    PT Stop Time  1035    PT Time Calculation (min)  49 min    Activity Tolerance  Patient tolerated treatment well;Patient limited by pain    Behavior During Therapy  [facility] for tasks assessed/performed       Past Medical History:  Diagnosis Date  . Hyperlipidemia   . Hypertension   . Hypothyroidism     Past Surgical History:  Procedure Laterality Date  . ABDOMINAL HYSTERECTOMY    . CARDIAC CATHETERIZATION     30 years ago, [place]  . CHOLECYSTECTOMY N/A 08/21/2015   Procedure: LAPAROSCOPIC CHOLECYSTECTOMY;  Surgeon: Jules Husbands, MD;  Location: [facility];  Service: General;  Laterality: N/A;  . EYE SURGERY     eye lid lift  . KNEE ARTHROSCOPY Right 08/28/2018   Procedure: ARTHROSCOPY KNEE;  Surgeon: Lovell Sheehan, MD;  Location: [place];  Service: Orthopedics;  Laterality: Right;  . PARTIAL HYSTERECTOMY    . TONSILLECTOMY      There were no vitals filed for this visit.  Subjective Assessment - 05/06/19 0951    Subjective  Pt. states R knee has been swollen past couple days.  Pt. entered PT with no assistive device today.  Pt. reports no pain, just stiffness.  Pt. drove to PT by herself today with no issues reported.    Pertinent History  Pt is a 79 y.o. female 3 months s/p R knee arthoscopy on 08/28/2018. 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 The ACL showed partial tearing of less than 50% and the PCL was intact. There was mild lateral subluxation of the patella.  Pt is sedentary and reports she does not like PT and is only coming per doctor's request.  Pt received shot of cortisone 2 weeks ago and has had no pain since.  Swelling progressively worsens throughout day.  Pt states she is a frequent faller.  Pt is inactive and expresses reluctance to activity/ exercises.  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  NA    Patient Stated Goals  Increase R knee ROM/ strength/ improve pain-free mobility.    Currently in Pain?  No/denies         There.ex.:  Nustep L5 10 min. B UE/LE (consistent cadence/no increase knee pain)- warm-up Sit to stands 10x from gray chair (min. To no UE assist) Walking in //-bars forward/ lateral with R UE assist and mirror feedback.  Walking in hallway without assistive and varying cadence (cuing for step pattern/ length/ BOS).   Seated marching/ LAQ/ standing heel raises (20x).  Standing  high marching 10x2 (no ankle wts.).   Supine SLR 5x independently and 5x with PT assist (significant R hip muscle weakness/ fatigue).    Manual tx.:  Supine R hamstring/ quad stretches (R knee AA/PROM)- 5x each with static holds (107 deg.). Supine R patellar mobs (all planes).  STM to R distal hamstring/ knee joint line during R knee ext. Stretch Scar massage (vitamin E lotion)- no steristrips/ good healing.  Pt. Instructed to ice at home.    PT Long Term Goals - 04/25/19 1011      PT LONG TERM GOAL #1   Title  Pt will increase FOTO score to goal to improve functional mobility.    Baseline  See flowsheet    Time  4    Period  Weeks    Status  New    Target Date   05/21/19      PT LONG TERM GOAL #2   Title  Pt will be independent with HEP to increase R knee AROM (0 to 115 deg.)  to improve pain-free function at home.    Baseline  R knee AROM: -7 to 87 deg.    Time  4    Period  Weeks    Status  New    Target Date  05/21/19      PT LONG TERM GOAL #3   Title  Pt will increase strength of B LE 1/2 MMT grade in order to demonstrate improvement in strength and function.    Baseline  Pt. demonstrates the following muscular strength deficits MMT: Hip flexion L 4/5, R 4/5, Knee flexion L 4+/5, R 4/5, Knee extension L 4/5, R 4/5.    Time  4    Period  Weeks    Status  New    Target Date  05/21/19      PT LONG TERM GOAL #4   Title  Pt. able to ascend/ descend stairs wtih recip. pattern and no increase c/o R knee pain.    Baseline  Pain limited with stair climbing.    Time  4    Period  Weeks    Status  Not Met    Target Date  05/21/19      PT LONG TERM GOAL #5   Title  Pt. will ambulate with normalized gait pattern and no assistive device safely community distances.    Baseline  Moderate R antalgic gait with use of QC.  PT instructed use of QC on L side.    Time  4    Period  Weeks    Status  New    Target Date  05/21/19            Plan - 05/06/19 0952    Clinical Impression Statement  R knee flexion showing consistent progress (107 deg.).  Pts. R knee extension during gait remains limited with shorter stride lengths due to pt. fearful of LOB.  Pt. walking around PT clinic/ hallway without assistive device and no LOB.  R knee joint stiffness improves after standing ther.ex./ Nustep.  No increase c/o pain during ther.ex. but pain limited with R knee flexion AAROM and R knee extension (prolonged position).  No change to HEP at this time.    Stability/Clinical Decision Making  Stable/Uncomplicated    Clinical Decision Making  Low    Rehab Potential  Good    PT Frequency  2x / week    PT Duration  4 weeks    PT Treatment/Interventions   ADLs/Self  Care Home Management;Aquatic Therapy;Gait training;Traction;Moist Heat;Stair training;Functional mobility training;Therapeutic activities;Therapeutic exercise;Balance training;Neuromuscular re-education;Electrical Stimulation;Cryotherapy;Manual techniques;Patient/family education;Passive range of motion;Dry needling;Energy conservation;Ultrasound;Splinting;Taping;Joint Manipulations;Spinal Manipulations;Scar mobilization    PT Next Visit Plan  Progress R knee ROM/ quad stability.    PT Home Exercise Plan  [id number]       Patient will benefit from skilled therapeutic intervention in order to improve the following deficits and impairments:  Abnormal gait, Pain, Improper body mechanics, Postural dysfunction, Other (comment), Decreased mobility, Decreased coordination, Decreased activity tolerance, Decreased endurance, Decreased balance, Decreased safety awareness, Impaired flexibility, Decreased range of motion, Obesity, Decreased strength, Cardiopulmonary status limiting activity, Difficulty walking, Decreased scar mobility, Hypomobility, Increased fascial restricitons  Visit Diagnosis: Status post right knee replacement  Muscle weakness (generalized)  Joint stiffness of knee, right  Gait difficulty  Chronic pain of right knee     Problem List Patient Active Problem List   Diagnosis Date Noted  . Anxiety 04/02/2019  . Pre-op examination 04/02/2019  . Orthostasis 07/06/2018  . Travel advice encounter 10/19/2017  . Loss of perception for taste 10/19/2017  . Vertigo 10/19/2017  . Essential hypertension 03/05/2017  . S/P laparoscopic cholecystectomy 03/05/2017  . Ptosis, both eyelids 01/04/2017  . Medicare annual wellness visit, subsequent 07/24/2015  . Cerebrovascular small vessel disease 04/24/2015  . Benign paroxysmal positional vertigo 04/24/2015  . Right medial knee pain 04/24/2015  . Counseling for travel 01/11/2015  . Shoulder pain, right 11/22/2013  . Statin  intolerance 10/08/2012  . Edema 05/14/2012  . Generalized anxiety disorder 01/06/2012  . 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 Hypothyroidism 12/18/2010  . Low back pain radiating to both legs 12/18/2010  . Hip pain, right 12/18/2010  . Obesity 05/09/2010  . CAD (coronary artery disease) 05/09/2010  . Hyperlipidemia 05/09/2010  . Hypertension 05/09/2010   Pura Spice, PT, DPT # [phone] 05/06/2019, 7:19 PM  [place] [facility] [address] [place], [place], [address] Phone: [phone]   Fax:  [phone]  Name: Meagan Cox MRN: [id number] Date of Birth: 1940-07-29

## 2019-05-08 ENCOUNTER — Encounter: Payer: Self-pay | Admitting: Physical Therapy

## 2019-05-08 ENCOUNTER — Ambulatory Visit: Payer: PPO | Admitting: Physical Therapy

## 2019-05-08 ENCOUNTER — Other Ambulatory Visit: Payer: Self-pay

## 2019-05-08 DIAGNOSIS — Z96651 Presence of right artificial knee joint: Secondary | ICD-10-CM | POA: Diagnosis not present

## 2019-05-08 DIAGNOSIS — G8929 Other chronic pain: Secondary | ICD-10-CM

## 2019-05-08 DIAGNOSIS — M25661 Stiffness of right knee, not elsewhere classified: Secondary | ICD-10-CM

## 2019-05-08 DIAGNOSIS — R269 Unspecified abnormalities of gait and mobility: Secondary | ICD-10-CM

## 2019-05-08 DIAGNOSIS — M6281 Muscle weakness (generalized): Secondary | ICD-10-CM

## 2019-05-08 MED ORDER — METOPROLOL SUCCINATE ER 25 MG PO TB24: 25.0000 mg | ORAL_TABLET | Freq: Every day | ORAL | 1 refills | Status: DC

## 2019-05-08 NOTE — Therapy (Signed)
[place] [facility] [address]. [place], [place], [address] Phone: [phone]   Fax:  [phone]  Physical Therapy Treatment  Patient Details  Name: Meagan Cox MRN: [id number] Date of Birth: July 08, 1940 Referring Provider (PT): Dr. Harlow Mares   Encounter Date: 05/08/2019  PT End of Session - 05/08/19 1901    Visit Number  5    Number of Visits  9    Date for PT Re-Evaluation  05/21/19    Authorization - Visit Number  5    Authorization - Number of Visits  10    PT Start Time  [phone]    PT Stop Time  1030    PT Time Calculation (min)  53 min    Activity Tolerance  Patient tolerated treatment well;Patient limited by pain    Behavior During Therapy  [facility] for tasks assessed/performed       Past Medical History:  Diagnosis Date  . Hyperlipidemia   . Hypertension   . Hypothyroidism     Past Surgical History:  Procedure Laterality Date  . ABDOMINAL HYSTERECTOMY    . CARDIAC CATHETERIZATION     30 years ago, [place]  . CHOLECYSTECTOMY N/A 08/21/2015   Procedure: LAPAROSCOPIC CHOLECYSTECTOMY;  Surgeon: Jules Husbands, MD;  Location: [facility];  Service: General;  Laterality: N/A;  . EYE SURGERY     eye lid lift  . KNEE ARTHROSCOPY Right 08/28/2018   Procedure: ARTHROSCOPY KNEE;  Surgeon: Lovell Sheehan, MD;  Location: [place];  Service: Orthopedics;  Laterality: Right;  . PARTIAL HYSTERECTOMY    . TONSILLECTOMY      There were no vitals filed for this visit.  Subjective Assessment - 05/08/19 1847    Subjective  Pt. reports being sore in R LE after last PT session and massage therapy appt. (deep tissue).  No c/o knee pain at this time.  Pt. has stopped using SPC and drove to PT by herself this morning.    Pertinent History  Pt is a 79 y.o. female 3 months s/p R knee arthoscopy on 08/28/2018. 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 The ACL showed partial tearing of less than 50% and the PCL was intact. There was mild lateral subluxation of the patella.  Pt is sedentary and reports she does not like PT and is only coming per doctor's request.  Pt received shot of cortisone 2 weeks ago and has had no pain since.  Swelling progressively worsens throughout day.  Pt states she is a frequent faller.  Pt is inactive and expresses reluctance to activity/ exercises.  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  NA    Patient Stated Goals  Increase R knee ROM/ strength/ improve pain-free mobility.    Currently in Pain?  No/denies          There.ex.:  Nustep L5 10 min. B UE/LE (consistent cadence/no increase knee pain)- warm-up Walking in //-bars forward/ backwards/ lateral with min. To no UE assist and mirror feedback.  Walking in hallway without assistive and varying cadence (cuing for step pattern/ length/ BOS).   Seated marching/ LAQ/ standing heel raises (20x). Standing high marching 10x2 (limited R hip flexion as  compared to L).   Sit to stands with R foot behind L 10x with cuing to focus on R quad muscle activation.   Walking partial lunges (maintain knee in midline) in //-bars 2x.    Reviewed HEP      PT Long Term Goals - 04/25/19 1011      PT LONG TERM GOAL #1   Title  Pt will increase FOTO score to goal to improve functional mobility.    Baseline  See flowsheet    Time  4    Period  Weeks    Status  New    Target Date  05/21/19      PT LONG TERM GOAL #2   Title  Pt will be independent with HEP to increase R knee AROM (0 to 115 deg.)  to improve pain-free function at home.    Baseline  R knee AROM: -7 to 87 deg.    Time  4    Period  Weeks    Status  New     Target Date  05/21/19      PT LONG TERM GOAL #3   Title  Pt will increase strength of B LE 1/2 MMT grade in order to demonstrate improvement in strength and function.    Baseline  Pt. demonstrates the following muscular strength deficits MMT: Hip flexion L 4/5, R 4/5, Knee flexion L 4+/5, R 4/5, Knee extension L 4/5, R 4/5.    Time  4    Period  Weeks    Status  New    Target Date  05/21/19      PT LONG TERM GOAL #4   Title  Pt. able to ascend/ descend stairs wtih recip. pattern and no increase c/o R knee pain.    Baseline  Pain limited with stair climbing.    Time  4    Period  Weeks    Status  Not Met    Target Date  05/21/19      PT LONG TERM GOAL #5   Title  Pt. will ambulate with normalized gait pattern and no assistive device safely community distances.    Baseline  Moderate R antalgic gait with use of QC.  PT instructed use of QC on L side.    Time  4    Period  Weeks    Status  New    Target Date  05/21/19            Plan - 05/08/19 1902    Clinical Impression Statement  PT focusing on increasing R hip flexion/ strength and improving gait pattern/ stridelength.  Pt. not interested in using [facility] but may benefit from normalize 2-point gait pattern.  No LOB during tx. session but moderate LE muscle fatigue with walking partial lunges/ high marching ther.ex. in //-bars.  Excellent incision healing noted.  Pt. requires cuing to maintain R knee in midline position during Nustep/ sit to stands/ lunges.    Stability/Clinical Decision Making  Stable/Uncomplicated    Clinical Decision Making  Low    Rehab Potential  Good    PT Frequency  2x / week    PT Duration  4 weeks    PT Treatment/Interventions  ADLs/Self Care Home Management;Aquatic Therapy;Gait training;Traction;Moist Heat;Stair training;Functional mobility training;Therapeutic activities;Therapeutic exercise;Balance training;Neuromuscular re-education;Electrical Stimulation;Cryotherapy;Manual techniques;Patient/family  education;Passive range of motion;Dry needling;Energy conservation;Ultrasound;Splinting;Taping;Joint Manipulations;Spinal Manipulations;Scar mobilization    PT Next Visit Plan  Progress R knee ROM/ quad stability.  Reassess ROM of knee/ hip strengthening MMT.  PT Home Exercise Plan  [id number]       Patient will benefit from skilled therapeutic intervention in order to improve the following deficits and impairments:  Abnormal gait, Pain, Improper body mechanics, Postural dysfunction, Other (comment), Decreased mobility, Decreased coordination, Decreased activity tolerance, Decreased endurance, Decreased balance, Decreased safety awareness, Impaired flexibility, Decreased range of motion, Obesity, Decreased strength, Cardiopulmonary status limiting activity, Difficulty walking, Decreased scar mobility, Hypomobility, Increased fascial restricitons  Visit Diagnosis: Status post right knee replacement  Muscle weakness (generalized)  Joint stiffness of knee, right  Gait difficulty  Chronic pain of right knee     Problem List Patient Active Problem List   Diagnosis Date Noted  . Anxiety 04/02/2019  . Pre-op examination 04/02/2019  . Orthostasis 07/06/2018  . Travel advice encounter 10/19/2017  . Loss of perception for taste 10/19/2017  . Vertigo 10/19/2017  . Essential hypertension 03/05/2017  . S/P laparoscopic cholecystectomy 03/05/2017  . Ptosis, both eyelids 01/04/2017  . Medicare annual wellness visit, subsequent 07/24/2015  . Cerebrovascular small vessel disease 04/24/2015  . Benign paroxysmal positional vertigo 04/24/2015  . Right medial knee pain 04/24/2015  . Counseling for travel 01/11/2015  . Shoulder pain, right 11/22/2013  . Statin intolerance 10/08/2012  . Edema 05/14/2012  . Generalized anxiety disorder 01/06/2012  . 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 Hypothyroidism 12/18/2010  . Low back pain radiating to both legs 12/18/2010  . Hip pain, right 12/18/2010  .  Obesity 05/09/2010  . CAD (coronary artery disease) 05/09/2010  . Hyperlipidemia 05/09/2010  . Hypertension 05/09/2010   Pura Spice, PT, DPT # [phone] 05/08/2019, 7:07 PM  [place] [facility] [address] [place], [place], [address] Phone: [phone]   Fax:  [phone]  Name: Berdella Bacot MRN: [id number] Date of Birth: [birth year]

## 2019-05-13 ENCOUNTER — Ambulatory Visit: Payer: PPO | Admitting: Physical Therapy

## 2019-05-13 ENCOUNTER — Other Ambulatory Visit: Payer: Self-pay

## 2019-05-13 ENCOUNTER — Encounter: Payer: Self-pay | Admitting: Physical Therapy

## 2019-05-13 DIAGNOSIS — M6281 Muscle weakness (generalized): Secondary | ICD-10-CM

## 2019-05-13 DIAGNOSIS — G8929 Other chronic pain: Secondary | ICD-10-CM

## 2019-05-13 DIAGNOSIS — Z96651 Presence of right artificial knee joint: Secondary | ICD-10-CM

## 2019-05-13 DIAGNOSIS — R269 Unspecified abnormalities of gait and mobility: Secondary | ICD-10-CM

## 2019-05-13 DIAGNOSIS — M25661 Stiffness of right knee, not elsewhere classified: Secondary | ICD-10-CM

## 2019-05-13 NOTE — Therapy (Signed)
[place] [facility] [address]. [place], [place], [address] Phone: [phone]   Fax:  [phone]  Physical Therapy Treatment  Patient Details  Name: Meagan Cox MRN: [id number] Date of Birth: 01-24-1941 Referring Provider (PT): Dr. Harlow Mares   Encounter Date: 05/13/2019  PT End of Session - 05/13/19 0944    Visit Number  6    Number of Visits  9    Date for PT Re-Evaluation  05/21/19    Authorization - Visit Number  6    Authorization - Number of Visits  10    PT Start Time  0944    PT Stop Time  1033    PT Time Calculation (min)  49 min    Activity Tolerance  Patient tolerated treatment well;Patient limited by pain    Behavior During Therapy  [facility] for tasks assessed/performed       Past Medical History:  Diagnosis Date  . Hyperlipidemia   . Hypertension   . Hypothyroidism     Past Surgical History:  Procedure Laterality Date  . ABDOMINAL HYSTERECTOMY    . CARDIAC CATHETERIZATION     30 years ago, [place]  . CHOLECYSTECTOMY N/A 08/21/2015   Procedure: LAPAROSCOPIC CHOLECYSTECTOMY;  Surgeon: Jules Husbands, MD;  Location: [facility];  Service: General;  Laterality: N/A;  . EYE SURGERY     eye lid lift  . KNEE ARTHROSCOPY Right 08/28/2018   Procedure: ARTHROSCOPY KNEE;  Surgeon: Lovell Sheehan, MD;  Location: [place];  Service: Orthopedics;  Laterality: Right;  . PARTIAL HYSTERECTOMY    . TONSILLECTOMY      There were no vitals filed for this visit.  Subjective Assessment - 05/13/19 0943    Subjective  Pt. doing better.  Pt. entered PT with no SPC and reports no pain, just stiffness/ soreness in R knee.    Pertinent History  Pt is a 79 y.o. female 3 months s/p R knee arthoscopy on 08/28/2018. 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 The ACL showed  partial tearing of less than 50% and the PCL was intact. There was mild lateral subluxation of the patella.  Pt is sedentary and reports she does not like PT and is only coming per doctor's request.  Pt received shot of cortisone 2 weeks ago and has had no pain since.  Swelling progressively worsens throughout day.  Pt states she is a frequent faller.  Pt is inactive and expresses reluctance to activity/ exercises.  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  NA    Patient Stated Goals  Increase R knee ROM/ strength/ improve pain-free mobility.    Currently in Pain?  No/denies          There.ex.:  Walking outside around building prior to tx./ Nustep.  No assistive device/ cuing to prevent scuffing R and L heel.   Nustep L510 min. B UE/LE, seat 9-8 (consistent cadence/no increase knee pain)- warm-up  Resisted walking in //-bars forward/ backwards/ lateral 5x each with min. To no UE assist and mirror feedback.  Seated (4#) marching/ LAQ/ standing heel raises (20x). Standing (4#) high marching 10x2(limited R hip flexion as compared to L).   Sit to  stands with R foot behind L 10x with cuing to focus on R quad muscle activation.     Reviewed HEP  Manual tx.:  Supine reassessment of R patella mobs. (all planes)- good mobility.  Supine R knee AA/PROM (flexion/ extension) 5x each.  -1 to 111 deg. (pain tolerable)  Scar massage/ STM to R distal quad    PT Long Term Goals - 04/25/19 1011      PT LONG TERM GOAL #1   Title  Pt will increase FOTO score to goal to improve functional mobility.    Baseline  See flowsheet    Time  4    Period  Weeks    Status  New    Target Date  05/21/19      PT LONG TERM GOAL #2   Title  Pt will be independent with HEP to increase R knee AROM (0 to 115 deg.)  to improve pain-free  function at home.    Baseline  R knee AROM: -7 to 87 deg.    Time  4    Period  Weeks    Status  New    Target Date  05/21/19      PT LONG TERM GOAL #3   Title  Pt will increase strength of B LE 1/2 MMT grade in order to demonstrate improvement in strength and function.    Baseline  Pt. demonstrates the following muscular strength deficits MMT: Hip flexion L 4/5, R 4/5, Knee flexion L 4+/5, R 4/5, Knee extension L 4/5, R 4/5.    Time  4    Period  Weeks    Status  New    Target Date  05/21/19      PT LONG TERM GOAL #4   Title  Pt. able to ascend/ descend stairs wtih recip. pattern and no increase c/o R knee pain.    Baseline  Pain limited with stair climbing.    Time  4    Period  Weeks    Status  Not Met    Target Date  05/21/19      PT LONG TERM GOAL #5   Title  Pt. will ambulate with normalized gait pattern and no assistive device safely community distances.    Baseline  Moderate R antalgic gait with use of QC.  PT instructed use of QC on L side.    Time  4    Period  Weeks    Status  New    Target Date  05/21/19            Plan - 05/13/19 0944    Clinical Impression Statement  R hip/ hamstring muscle weakness and fatigue noted during standing resisted therex.  Pt. completes seated/ standing ther.ex. with 4# ankle wts. and moderate fatigue in R hip flexion/ distal hamstring.  Marked increase in supine R knee extension (-1 deg.) and R knee flexion (111 deg.) in long seated position.  Pt. will ocassional scuff L heel/ toe while walking on uneven terrain and cuing provded to correct.    Stability/Clinical Decision Making  Stable/Uncomplicated    Clinical Decision Making  Low    Rehab Potential  Good    PT Frequency  2x / week    PT Duration  4 weeks    PT Treatment/Interventions  ADLs/Self Care Home Management;Aquatic Therapy;Gait training;Traction;Moist Heat;Stair training;Functional mobility training;Therapeutic activities;Therapeutic exercise;Balance  training;Neuromuscular re-education;Electrical Stimulation;Cryotherapy;Manual techniques;Patient/family education;Passive range of motion;Dry needling;Energy conservation;Ultrasound;Splinting;Taping;Joint Manipulations;Spinal Manipulations;Scar mobilization    PT Next Visit  Plan  Progress R knee ROM/ quad stability.    PT Home Exercise Plan  [id number]       Patient will benefit from skilled therapeutic intervention in order to improve the following deficits and impairments:  Abnormal gait, Pain, Improper body mechanics, Postural dysfunction, Other (comment), Decreased mobility, Decreased coordination, Decreased activity tolerance, Decreased endurance, Decreased balance, Decreased safety awareness, Impaired flexibility, Decreased range of motion, Obesity, Decreased strength, Cardiopulmonary status limiting activity, Difficulty walking, Decreased scar mobility, Hypomobility, Increased fascial restricitons  Visit Diagnosis: Status post right knee replacement  Muscle weakness (generalized)  Joint stiffness of knee, right  Gait difficulty  Chronic pain of right knee     Problem List Patient Active Problem List   Diagnosis Date Noted  . Anxiety 04/02/2019  . Pre-op examination 04/02/2019  . Orthostasis 07/06/2018  . Travel advice encounter 10/19/2017  . Loss of perception for taste 10/19/2017  . Vertigo 10/19/2017  . Essential hypertension 03/05/2017  . S/P laparoscopic cholecystectomy 03/05/2017  . Ptosis, both eyelids 01/04/2017  . Medicare annual wellness visit, subsequent 07/24/2015  . Cerebrovascular small vessel disease 04/24/2015  . Benign paroxysmal positional vertigo 04/24/2015  . Right medial knee pain 04/24/2015  . Counseling for travel 01/11/2015  . Shoulder pain, right 11/22/2013  . Statin intolerance 10/08/2012  . Edema 05/14/2012  . Generalized anxiety disorder 01/06/2012  . 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 Hypothyroidism 12/18/2010  . Low back pain radiating  to both legs 12/18/2010  . Hip pain, right 12/18/2010  . Obesity 05/09/2010  . CAD (coronary artery disease) 05/09/2010  . Hyperlipidemia 05/09/2010  . Hypertension 05/09/2010   Pura Spice, PT, DPT # [id number] 05/13/2019, 12:46 PM  [place] [facility] [address] [place], [place], [address] Phone: [phone]   Fax:  [phone]  Name: Meagan Cox MRN: [id number] Date of Birth: 1941/01/12

## 2019-05-15 ENCOUNTER — Ambulatory Visit: Payer: PPO | Admitting: Physical Therapy

## 2019-05-15 ENCOUNTER — Other Ambulatory Visit: Payer: Self-pay

## 2019-05-15 DIAGNOSIS — R269 Unspecified abnormalities of gait and mobility: Secondary | ICD-10-CM

## 2019-05-15 DIAGNOSIS — M25561 Pain in right knee: Secondary | ICD-10-CM

## 2019-05-15 DIAGNOSIS — G8929 Other chronic pain: Secondary | ICD-10-CM

## 2019-05-15 DIAGNOSIS — M6281 Muscle weakness (generalized): Secondary | ICD-10-CM

## 2019-05-15 DIAGNOSIS — Z96651 Presence of right artificial knee joint: Secondary | ICD-10-CM | POA: Diagnosis not present

## 2019-05-15 DIAGNOSIS — M25661 Stiffness of right knee, not elsewhere classified: Secondary | ICD-10-CM

## 2019-05-18 ENCOUNTER — Encounter: Payer: Self-pay | Admitting: Physical Therapy

## 2019-05-18 DIAGNOSIS — Z96651 Presence of right artificial knee joint: Secondary | ICD-10-CM | POA: Diagnosis not present

## 2019-05-18 NOTE — Therapy (Signed)
[facility] [address] [place], [place], [address] Phone: [phone]   Fax:  [phone]  Physical Therapy Treatment  Patient Details  Name: Meagan Cox MRN: [id number] Date of Birth: [birth year] Referring Provider (PT): Dr. Harlow Mares   Encounter Date: 05/15/2019  PT End of Session - 05/18/19 1953    Visit Number  7    Number of Visits  9    Date for PT Re-Evaluation  05/21/19    Authorization - Visit Number  7    Authorization - Number of Visits  10    PT Start Time  [phone]    PT Stop Time  1034    PT Time Calculation (min)  52 min    Activity Tolerance  Patient tolerated treatment well;Patient limited by pain    Behavior During Therapy  [facility] for tasks assessed/performed       Past Medical History:  Diagnosis Date  . Hyperlipidemia   . Hypertension   . Hypothyroidism     Past Surgical History:  Procedure Laterality Date  . ABDOMINAL HYSTERECTOMY    . CARDIAC CATHETERIZATION     30 years ago, [place]  . CHOLECYSTECTOMY N/A 08/21/2015   Procedure: LAPAROSCOPIC CHOLECYSTECTOMY;  Surgeon: Jules Husbands, MD;  Location: [facility];  Service: General;  Laterality: N/A;  . EYE SURGERY     eye lid lift  . KNEE ARTHROSCOPY Right 08/28/2018   Procedure: ARTHROSCOPY KNEE;  Surgeon: Lovell Sheehan, MD;  Location: [place];  Service: Orthopedics;  Laterality: Right;  . PARTIAL HYSTERECTOMY    . TONSILLECTOMY      There were no vitals filed for this visit.  Subjective Assessment - 05/18/19 1952    Subjective  Pt. states she is doing okay.  No pian in R knee, just soreness this morning.    Pertinent History  Pt is a 79 y.o. female 3 months s/p R knee arthoscopy on 08/28/2018. 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 The ACL showed partial tearing of less than  50% and the PCL was intact. There was mild lateral subluxation of the patella.  Pt is sedentary and reports she does not like PT and is only coming per doctor's request.  Pt received shot of cortisone 2 weeks ago and has had no pain since.  Swelling progressively worsens throughout day.  Pt states she is a frequent faller.  Pt is inactive and expresses reluctance to activity/ exercises.  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  NA    Patient Stated Goals  Increase R knee ROM/ strength/ improve pain-free mobility.    Currently in Pain?  No/denies        There.ex.:  Nustep L510 min. B UE/LE, seat 8 (consistent cadence/no increase knee pain)- warm-up  Resisted walking in //-bars forward/backwards/lateral 5x each with min. To noUE assist and mirror feedback.  Seated (4#) marching/ LAQ/ standing heel raises (20x). Standing (4#) high marching 10x2(limited R hip flexion as compared to L).  Supine SLR (10x on R)- difficulty with min. PT assist.  Supine hamstring/ hip stretches (generalized).   Step ups/ downs 6" step on  R.    PT Long Term Goals -  04/25/19 1011      PT LONG TERM GOAL #1   Title  Pt will increase FOTO score to goal to improve functional mobility.    Baseline  See flowsheet    Time  4    Period  Weeks    Status  New    Target Date  05/21/19      PT LONG TERM GOAL #2   Title  Pt will be independent with HEP to increase R knee AROM (0 to 115 deg.)  to improve pain-free function at home.    Baseline  R knee AROM: -7 to 87 deg.    Time  4    Period  Weeks    Status  New    Target Date  05/21/19      PT LONG TERM GOAL #3   Title  Pt will increase strength of B LE 1/2 MMT grade in order to demonstrate improvement in strength and function.    Baseline  Pt. demonstrates the following muscular  strength deficits MMT: Hip flexion L 4/5, R 4/5, Knee flexion L 4+/5, R 4/5, Knee extension L 4/5, R 4/5.    Time  4    Period  Weeks    Status  New    Target Date  05/21/19      PT LONG TERM GOAL #4   Title  Pt. able to ascend/ descend stairs wtih recip. pattern and no increase c/o R knee pain.    Baseline  Pain limited with stair climbing.    Time  4    Period  Weeks    Status  Not Met    Target Date  05/21/19      PT LONG TERM GOAL #5   Title  Pt. will ambulate with normalized gait pattern and no assistive device safely community distances.    Baseline  Moderate R antalgic gait with use of QC.  PT instructed use of QC on L side.    Time  4    Period  Weeks    Status  New    Target Date  05/21/19            Plan - 05/18/19 1953    Clinical Impression Statement  Pt. works hard during PT tx. session and showing consistent progress with R knee ROM/ B LE generalized strengthening.  Pt. is walking with more consistent gait pattern but still has slight antalgic gait while walking in clinic.  Cuing to increase hip flexion/ step length while outside on grassy/ uneven terrain.  Pt. encouraged to stay active with daily walking/ HEP.    Stability/Clinical Decision Making  Stable/Uncomplicated    Clinical Decision Making  Low    Rehab Potential  Good    PT Frequency  2x / week    PT Duration  4 weeks    PT Treatment/Interventions  ADLs/Self Care Home Management;Aquatic Therapy;Gait training;Traction;Moist Heat;Stair training;Functional mobility training;Therapeutic activities;Therapeutic exercise;Balance training;Neuromuscular re-education;Electrical Stimulation;Cryotherapy;Manual techniques;Patient/family education;Passive range of motion;Dry needling;Energy conservation;Ultrasound;Splinting;Taping;Joint Manipulations;Spinal Manipulations;Scar mobilization    PT Next Visit Plan  Progress R knee ROM/ quad stability.    PT Home Exercise Plan  [id number]       Patient will benefit from  skilled therapeutic intervention in order to improve the following deficits and impairments:  Abnormal gait, Pain, Improper body mechanics, Postural dysfunction, Other (comment), Decreased mobility, Decreased coordination, Decreased activity tolerance, Decreased endurance, Decreased balance, Decreased safety awareness, Impaired flexibility, Decreased range of motion, Obesity, Decreased strength, Cardiopulmonary status  limiting activity, Difficulty walking, Decreased scar mobility, Hypomobility, Increased fascial restricitons  Visit Diagnosis: Status post right knee replacement  Muscle weakness (generalized)  Joint stiffness of knee, right  Gait difficulty  Chronic pain of right knee     Problem List Patient Active Problem List   Diagnosis Date Noted  . Anxiety 04/02/2019  . Pre-op examination 04/02/2019  . Orthostasis 07/06/2018  . Travel advice encounter 10/19/2017  . Loss of perception for taste 10/19/2017  . Vertigo 10/19/2017  . Essential hypertension 03/05/2017  . S/P laparoscopic cholecystectomy 03/05/2017  . Ptosis, both eyelids 01/04/2017  . Medicare annual wellness visit, subsequent 07/24/2015  . Cerebrovascular small vessel disease 04/24/2015  . Benign paroxysmal positional vertigo 04/24/2015  . Right medial knee pain 04/24/2015  . Counseling for travel 01/11/2015  . Shoulder pain, right 11/22/2013  . Statin intolerance 10/08/2012  . Edema 05/14/2012  . Generalized anxiety disorder 01/06/2012  . 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 Hypothyroidism 12/18/2010  . Low back pain radiating to both legs 12/18/2010  . Hip pain, right 12/18/2010  . Obesity 05/09/2010  . CAD (coronary artery disease) 05/09/2010  . Hyperlipidemia 05/09/2010  . Hypertension 05/09/2010   Pura Spice, PT, DPT # [phone] 05/18/2019, 8:07 PM  [facility] [address] [place], [place], [address] Phone: [phone]   Fax:   [phone]  Name: Meagan Cox MRN: [id number] Date of Birth: 08/31/1940

## 2019-05-20 ENCOUNTER — Ambulatory Visit: Payer: PPO

## 2019-05-20 ENCOUNTER — Other Ambulatory Visit: Payer: Self-pay

## 2019-05-20 DIAGNOSIS — M6281 Muscle weakness (generalized): Secondary | ICD-10-CM

## 2019-05-20 DIAGNOSIS — Z96651 Presence of right artificial knee joint: Secondary | ICD-10-CM | POA: Diagnosis not present

## 2019-05-20 DIAGNOSIS — M25661 Stiffness of right knee, not elsewhere classified: Secondary | ICD-10-CM

## 2019-05-20 NOTE — Therapy (Signed)
[place] [facility] [address] [place], [place], [address] Phone: [phone]   Fax:  [phone]  Physical Therapy Treatment  Patient Details  Name: Meagan Cox MRN: [id number] Date of Birth: 1940-07-18 Referring Provider (PT): Dr. Harlow Mares   Encounter Date: 05/20/2019  PT End of Session - 05/20/19 1228    Visit Number  8    Number of Visits  9    Date for PT Re-Evaluation  05/21/19    Authorization - Visit Number  8    Authorization - Number of Visits  10    PT Start Time  0945    PT Stop Time  1035    PT Time Calculation (min)  50 min    Activity Tolerance  Patient tolerated treatment well;Patient limited by pain    Behavior During Therapy  [facility] for tasks assessed/performed       Past Medical History:  Diagnosis Date  . Hyperlipidemia   . Hypertension   . Hypothyroidism     Past Surgical History:  Procedure Laterality Date  . ABDOMINAL HYSTERECTOMY    . CARDIAC CATHETERIZATION     30 years ago, [place]  . CHOLECYSTECTOMY N/A 08/21/2015   Procedure: LAPAROSCOPIC CHOLECYSTECTOMY;  Surgeon: Jules Husbands, MD;  Location: [facility];  Service: General;  Laterality: N/A;  . EYE SURGERY     eye lid lift  . KNEE ARTHROSCOPY Right 08/28/2018   Procedure: ARTHROSCOPY KNEE;  Surgeon: Lovell Sheehan, MD;  Location: [place];  Service: Orthopedics;  Laterality: Right;  . PARTIAL HYSTERECTOMY    . TONSILLECTOMY      There were no vitals filed for this visit.  Subjective Assessment - 05/20/19 1221    Subjective  Pt reports she went to see her surgeon for follow up and she is planning to finish her PT today.  She overall feels like she is walking better and she continues to get stronger.  She is looking forward to a beach trip  next month.    Pertinent History  Pt is a 79 y.o. female 3 months s/p R knee arthoscopy on 08/28/2018. 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 The ACL showed partial tearing of less than 50% and the PCL was intact. There was mild lateral subluxation of the patella.  Pt is sedentary and reports she does not like PT and is only coming per doctor's request.  Pt received shot of cortisone 2 weeks ago and has had no pain since.  Swelling progressively worsens throughout day.  Pt states she is a frequent faller.  Pt is inactive and expresses reluctance to activity/ exercises.  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  NA    Patient Stated Goals  Increase R knee ROM/ strength/ improve pain-free mobility.       Today's Treatment:   Nustep: x 10 min, seat #8, level 5-4, focused on knee AROM during exercise Stairs: 3x4 steps ascend/descend using bilateral railing, reciprocal gait pattern Ambulation without any AD flat surface in clinic 3 laps Sit to stand with R foot slightly posterior to L 2x10 LAQ 4# 2x10 Standing marches 4# 2x10 (alternating) in parallel bars Heel raises 2x10 Airex long  pad- amb forward/reverse/lateral 4x each SLS R LE 5 sec holds x10  Knee flexion/extension AROM 5x each SLR x2  Knee AROM: 0-117 degrees supine R LE MMT (documented in goal section)    PT Long Term Goals - 05/20/19 1236      PT LONG TERM GOAL #1   Baseline  FOTO score: 50    Status  Achieved      PT LONG TERM GOAL #2   Baseline  R knee AROM: 0-117 degrees    Status  Achieved      PT LONG TERM GOAL #3   Baseline  R MMT: knee flex 4+/5, knee extension 4/5, hip flex 4/5    Status  Partially Met      PT LONG TERM GOAL #4   Baseline  able to ascend/descend 4 stairs x 3 reps with reciprocal gait pattern using b/l rail    Status  Achieved      PT LONG TERM GOAL #5    Baseline  Pt amb without SPC on flat surfaces in clinic and normal R heel strike    Status  Achieved            Plan - 05/20/19 1229    Clinical Impression Statement  Pt able to achieve R heel strike while ambulating in clinic without SPC on flat surfaces.  Practiced ambulating on uneven surface (airex) to simulate walking on sand/grass she had no loss of balance, but did require occasional light touch on parallel bars for balance.  PT discussed the importance of having a family member nearby for safety when she travels to the beach and attempts to walk on sand for the first time next month.  Overall, her knee ROM and strength have improved with physical therapy.  She should continue to get stronger if she continues to work on her HEP and gradual activity progression.    Stability/Clinical Decision Making  Stable/Uncomplicated    Rehab Potential  Good    PT Frequency  2x / week    PT Duration  4 weeks    PT Treatment/Interventions  ADLs/Self Care Home Management;Aquatic Therapy;Gait training;Traction;Moist Heat;Stair training;Functional mobility training;Therapeutic activities;Therapeutic exercise;Balance training;Neuromuscular re-education;Electrical Stimulation;Cryotherapy;Manual techniques;Patient/family education;Passive range of motion;Dry needling;Energy conservation;Ultrasound;Splinting;Taping;Joint Manipulations;Spinal Manipulations;Scar mobilization    PT Next Visit Plan  Progress R knee ROM/ quad stability.    PT Home Exercise Plan  [id number]       Patient will benefit from skilled therapeutic intervention in order to improve the following deficits and impairments:  Abnormal gait, Pain, Improper body mechanics, Postural dysfunction, Other (comment), Decreased mobility, Decreased coordination, Decreased activity tolerance, Decreased endurance, Decreased balance, Decreased safety awareness, Impaired flexibility, Decreased range of motion, Obesity, Decreased strength, Cardiopulmonary  status limiting activity, Difficulty walking, Decreased scar mobility, Hypomobility, Increased fascial restricitons  Visit Diagnosis: Status post right knee replacement  Muscle weakness (generalized)  Joint stiffness of knee, right     Problem List Patient Active Problem List   Diagnosis Date Noted  . Anxiety 04/02/2019  . Pre-op examination 04/02/2019  . Orthostasis 07/06/2018  . Travel advice encounter 10/19/2017  . Loss of perception for taste 10/19/2017  . Vertigo 10/19/2017  . Essential hypertension 03/05/2017  . S/P laparoscopic cholecystectomy 03/05/2017  . Ptosis, both eyelids 01/04/2017  . Medicare annual wellness visit, subsequent 07/24/2015  . Cerebrovascular small vessel disease 04/24/2015  . Benign paroxysmal positional vertigo 04/24/2015  . Right medial knee pain 04/24/2015  . Counseling for travel 01/11/2015  . Shoulder pain, right 11/22/2013  .  Statin intolerance 10/08/2012  . Edema 05/14/2012  . Generalized anxiety disorder 01/06/2012  . 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 Hypothyroidism 12/18/2010  . Low back pain radiating to both legs 12/18/2010  . Hip pain, right 12/18/2010  . Obesity 05/09/2010  . CAD (coronary artery disease) 05/09/2010  . Hyperlipidemia 05/09/2010  . Hypertension 05/09/2010    Pincus Badder 05/20/2019, 1:01 PM  Merdis Delay, PT, DPT     [place] [address] [place], [place], [address] Phone: [phone]   Fax:  [phone]  Name: Taquilla Downum MRN: [id number] Date of Birth: [birth year]

## 2019-05-22 ENCOUNTER — Ambulatory Visit: Payer: PPO | Admitting: Physical Therapy

## 2019-05-26 ENCOUNTER — Other Ambulatory Visit: Payer: Self-pay

## 2019-05-26 MED ORDER — ESTRADIOL 2 MG PO TABS: ORAL_TABLET | ORAL | 1 refills | Status: DC

## 2019-05-27 ENCOUNTER — Encounter: Payer: PPO | Admitting: Physical Therapy

## 2019-05-29 ENCOUNTER — Ambulatory Visit: Payer: PPO | Admitting: Physical Therapy

## 2019-10-12 DIAGNOSIS — M4603 Spinal enthesopathy, cervicothoracic region: Secondary | ICD-10-CM | POA: Diagnosis not present

## 2019-10-12 DIAGNOSIS — M9902 Segmental and somatic dysfunction of thoracic region: Secondary | ICD-10-CM | POA: Diagnosis not present

## 2019-10-12 DIAGNOSIS — M9901 Segmental and somatic dysfunction of cervical region: Secondary | ICD-10-CM | POA: Diagnosis not present

## 2019-10-22 ENCOUNTER — Other Ambulatory Visit: Payer: Self-pay | Admitting: Internal Medicine

## 2019-11-02 MED ORDER — METOPROLOL SUCCINATE ER 25 MG PO TB24
25.0000 mg | ORAL_TABLET | Freq: Every day | ORAL | 1 refills | Status: DC
Start: 2019-11-02 — End: 2020-04-25

## 2019-11-30 DIAGNOSIS — M4603 Spinal enthesopathy, cervicothoracic region: Secondary | ICD-10-CM | POA: Diagnosis not present

## 2019-11-30 DIAGNOSIS — M9901 Segmental and somatic dysfunction of cervical region: Secondary | ICD-10-CM | POA: Diagnosis not present

## 2019-11-30 DIAGNOSIS — M9902 Segmental and somatic dysfunction of thoracic region: Secondary | ICD-10-CM | POA: Diagnosis not present

## 2020-03-14 ENCOUNTER — Other Ambulatory Visit: Payer: Self-pay | Admitting: Internal Medicine

## 2020-04-22 ENCOUNTER — Other Ambulatory Visit: Payer: Self-pay | Admitting: Internal Medicine

## 2020-04-25 ENCOUNTER — Other Ambulatory Visit: Payer: Self-pay | Admitting: Internal Medicine

## 2020-05-04 ENCOUNTER — Encounter: Payer: Self-pay | Admitting: Internal Medicine

## 2020-05-04 ENCOUNTER — Ambulatory Visit (INDEPENDENT_AMBULATORY_CARE_PROVIDER_SITE_OTHER): Payer: PPO | Admitting: Internal Medicine

## 2020-05-04 ENCOUNTER — Other Ambulatory Visit: Payer: Self-pay

## 2020-05-04 VITALS — BP 120/74 | HR 57 | Temp 97.4°F | Resp 16 | Ht 62.0 in | Wt 189.8 lb

## 2020-05-04 DIAGNOSIS — R5383 Other fatigue: Secondary | ICD-10-CM | POA: Diagnosis not present

## 2020-05-04 DIAGNOSIS — Z23 Encounter for immunization: Secondary | ICD-10-CM

## 2020-05-04 DIAGNOSIS — Z1239 Encounter for other screening for malignant neoplasm of breast: Secondary | ICD-10-CM

## 2020-05-04 DIAGNOSIS — R635 Abnormal weight gain: Secondary | ICD-10-CM | POA: Diagnosis not present

## 2020-05-04 DIAGNOSIS — I2510 Atherosclerotic heart disease of native coronary artery without angina pectoris: Secondary | ICD-10-CM

## 2020-05-04 DIAGNOSIS — Z1211 Encounter for screening for malignant neoplasm of colon: Secondary | ICD-10-CM | POA: Diagnosis not present

## 2020-05-04 DIAGNOSIS — E66812 Obesity, class 2: Secondary | ICD-10-CM

## 2020-05-04 DIAGNOSIS — Z6837 Body mass index (BMI) 37.0-37.9, adult: Secondary | ICD-10-CM | POA: Diagnosis not present

## 2020-05-04 DIAGNOSIS — E782 Mixed hyperlipidemia: Secondary | ICD-10-CM

## 2020-05-04 DIAGNOSIS — E034 Atrophy of thyroid (acquired): Secondary | ICD-10-CM | POA: Diagnosis not present

## 2020-05-04 DIAGNOSIS — I10 Essential (primary) hypertension: Secondary | ICD-10-CM

## 2020-05-04 LAB — COMPREHENSIVE METABOLIC PANEL
ALT: 8 U/L (ref 0–35)
AST: 12 U/L (ref 0–37)
Albumin: 3.9 g/dL (ref 3.5–5.2)
Alkaline Phosphatase: 62 U/L (ref 39–117)
BUN: 13 mg/dL (ref 6–23)
CO2: 31 mEq/L (ref 19–32)
Calcium: 9 mg/dL (ref 8.4–10.5)
Chloride: 104 mEq/L (ref 96–112)
Creatinine, Ser: 0.75 mg/dL (ref 0.40–1.20)
GFR: 76.35 mL/min (ref >60.00)
Glucose, Bld: 86 mg/dL (ref 70–99)
Potassium: 4.3 mEq/L (ref 3.5–5.1)
Sodium: 140 mEq/L (ref 135–145)
Total Bilirubin: 0.5 mg/dL (ref 0.2–1.2)
Total Protein: 6.1 g/dL (ref 6.0–8.3)

## 2020-05-04 LAB — CBC WITH DIFFERENTIAL/PLATELET
Basophils Absolute: 0.1 10*3/uL (ref 0.0–0.1)
Basophils Relative: 0.8 % (ref 0.0–3.0)
Eosinophils Absolute: 0.1 10*3/uL (ref 0.0–0.7)
Eosinophils Relative: 1.4 % (ref 0.0–5.0)
HCT: 44.5 % (ref 36.0–46.0)
Hemoglobin: 14.9 g/dL (ref 12.0–15.0)
Lymphocytes Relative: 25.1 % (ref 12.0–46.0)
Lymphs Abs: 2 10*3/uL (ref 0.7–4.0)
MCHC: 33.5 g/dL (ref 30.0–36.0)
MCV: 92.9 fl (ref 78.0–100.0)
Monocytes Absolute: 0.6 10*3/uL (ref 0.1–1.0)
Monocytes Relative: 7.6 % (ref 3.0–12.0)
Neutro Abs: 5.1 10*3/uL (ref 1.4–7.7)
Neutrophils Relative %: 65.1 % (ref 43.0–77.0)
Platelets: 262 10*3/uL (ref 150.0–400.0)
RBC: 4.79 Mil/uL (ref 3.87–5.11)
RDW: 13.1 % (ref 11.5–15.5)
WBC: 7.9 10*3/uL (ref 4.0–10.5)

## 2020-05-04 LAB — LIPID PANEL
Cholesterol: 233 mg/dL — ABNORMAL HIGH (ref 0–200)
HDL: 65.3 mg/dL (ref >39.00)
LDL Cholesterol: 135 mg/dL — ABNORMAL HIGH (ref 0–99)
NonHDL: 168.06
Total CHOL/HDL Ratio: 4
Triglycerides: 166 mg/dL — ABNORMAL HIGH (ref 0.0–149.0)
VLDL: 33.2 mg/dL (ref 0.0–40.0)

## 2020-05-04 LAB — HEMOGLOBIN A1C: Hgb A1c MFr Bld: 5 % (ref 4.6–6.5)

## 2020-05-04 LAB — TSH: TSH: 2.9 u[IU]/mL (ref 0.35–4.50)

## 2020-05-04 NOTE — Assessment & Plan Note (Addendum)
She has historically declined screening with mammogram based on confidence in her own breast exam. Given her goals of care which would include treatment, she has decided to resume screening with mammogram

## 2020-05-04 NOTE — Patient Instructions (Addendum)
Your runny nose is due to allergies!   consider adding one of these newer second generation antihistamines that are longer acting, non sedating and  available OTC:  Generic  Zyrtec, which is cetirizine.    generic Allegra , available generically as fexofenadine ; comes in 60 mg and 180 mg once daily strengths.    Generic Claritin :  also available as loratidine .   I I HAVE ORDERED YOUR MAMMOGRAM  You received your last Pneumonia vaccine today  Cologuard is due to repeat ;  You will receive it in the mail

## 2020-05-04 NOTE — Progress Notes (Signed)
Patient ID: Meagan Cox, female    DOB: 27-Jun-1940  Age: 80 y.o. MRN: [id number]  The patient is here for follow up and management of other chronic and acute problems.   The risk factors are reflected in the social history.  The roster of all physicians providing medical care to patient - is listed in the Snapshot section of the chart.  Activities of daily living:  The patient is 100% independent in all ADLs: dressing, toileting, feeding as well as independent mobility  Home safety : The patient has smoke detectors in the home. They wear seatbelts.  There are no firearms at home. There is no violence in the home.   There is no risks for hepatitis, STDs or HIV. There is no   history of blood transfusion. They have no travel history to infectious disease endemic areas of the world.  The patient has seen their dentist in the last six month. She saw her eye doctor last summer.  No history of cataracts .  She denies hearing difficulty with regard to whispered voices and some television programs.  They have deferred audiologic testing in the last year.  They do not  have excessive sun exposure. Discussed the need for sun protection: hats, long sleeves and use of sunscreen if there is significant sun exposure.   Diet: the importance of a healthy diet is discussed. They do have a healthy diet. Weight is unchanged compared to last visit one year ago. Lost weight after hip replacement but gained it back.  The benefits of regular aerobic exercise were discussed. She walks 4 times per week ,  20 minutes.   Depression screen: there are no signs or vegative symptoms of depression- irritability, change in appetite, anhedonia, sadness/tearfullness.  Cognitive assessment: the patient manages all their financial and personal affairs and is actively engaged. They could relate day,date,year and events; recalled 2/3 objects at 3 minutes; performed clock-face test normally.  The following portions of the  patient's history were reviewed and updated as appropriate: allergies, current medications, past family history, past medical history,  past surgical history, past social history  and problem list.  Visual acuity was not assessed per patient preference since she has regular follow up with her ophthalmologist. Hearing and body mass index were assessed and reviewed.   During the course of the visit the patient was educated and counseled about appropriate screening and preventive services including : fall prevention , diabetes screening, nutrition counseling, colorectal cancer screening, and recommended immunizations.    CC: The primary encounter diagnosis was Essential hypertension. Diagnoses of Moderate mixed hyperlipidemia not requiring statin therapy, Hypothyroidism due to acquired atrophy of thyroid, Weight gain, Fatigue, unspecified type, Encounter for screening for malignant neoplasm of breast, unspecified screening modality, Colon cancer screening, Class 2 severe obesity due to excess calories with serious comorbidity and body mass index (BMI) of 37.0 to 37.9 in adult [facility]), and Coronary artery disease involving native heart without angina pectoris, unspecified vessel or lesion type were also pertinent to this visit.  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 She has fully recovered,   Doing great .  History of  Right hip replacement I n 2019 by same. Meagan Cox)  2) Has periodic  manipulation therapy for cervical disk disease   3) Trouble swallowing has returned .  Intermittent feeling of saliva, pills getting stopped temporarily . Denies coughing.  Not  Taking PPI  4) Insomnia managed now with CBG gummies  6) chronic sinus congestion due  to allergic rhinitis : using homeopathic remedies to manage,  Stopped taking Alavert  But now that the congestion has resolved,  She  has contant dripping   And "everything tastes salty"  For the past several months   7) irregular BMS  Since surgery,  Has been improved with  use of a magnesium capsule   History Meagan Cox has a past medical history of Hyperlipidemia, Hypertension, and Hypothyroidism.   She has a past surgical history that includes Cardiac catheterization; Tonsillectomy; Partial hysterectomy; Abdominal hysterectomy; Cholecystectomy (N/A, 08/21/2015); Eye surgery; Knee arthroscopy (Right, 08/28/2018); right hip replacement (Right, 2017); and Replacement total knee (Right, 2021).   Her family history includes Cancer in her father; Heart disease in her mother; Hypothyroidism in her mother.She reports that she quit smoking about 32 years ago. Her smoking use included cigarettes. She quit after 20.00 years of use. She has never used smokeless tobacco. She reports current alcohol use of about 1.0 standard drink of alcohol per week. She reports that she does not use drugs.  Outpatient Medications Prior to Visit  Medication Sig Dispense Refill  . ALPRAZolam (XANAX) 0.5 MG tablet Take 1 tablet (0.5 mg total) by mouth at bedtime as needed. 30 tablet 3  . estradiol (ESTRACE) 2 MG tablet TAKE 1 TABLET(2 MG) BY MOUTH DAILY 90 tablet 1  . levothyroxine (SYNTHROID) 100 MCG tablet TAKE 1 TABLET(100 MCG) BY MOUTH DAILY 90 tablet 3  . metoprolol succinate (TOPROL-XL) 25 MG 24 hr tablet TAKE 1 TABLET(25 MG) BY MOUTH DAILY 90 tablet 1  . Multiple Vitamin (MULTIVITAMIN) tablet Take 1 tablet by mouth daily.     No facility-administered medications prior to visit.    Review of Systems   Patient denies headache, fevers, malaise, unintentional weight loss, skin rash, eye pain, sinus congestion and sinus pain, sore throat, dysphagia,  hemoptysis , cough, dyspnea, wheezing, chest pain, palpitations, orthopnea, edema, abdominal pain, nausea, melena, diarrhea, constipation, flank pain, dysuria, hematuria, urinary  Frequency, nocturia, numbness, tingling, seizures,  Focal weakness, Loss of consciousness,  Tremor, insomnia, depression, anxiety, and suicidal ideation.       Objective:  BP 120/74 (BP Location: Left Arm, Patient Position: Sitting, Cuff Size: Normal)   Pulse (!) 57   Temp (!) 97.4 F (36.3 C) (Oral)   Resp 16   Ht 5\' 2"  (1.575 m)   Wt 189 lb 12.8 oz (86.1 kg)   SpO2 98%   BMI 34.71 kg/m   Physical Exam  General appearance: alert, cooperative and appears stated age Ears: normal TM's and external ear canals both ears Throat: lips, mucosa, and tongue normal; teeth and gums normal Neck: no adenopathy, no carotid bruit, supple, symmetrical, trachea midline and thyroid not enlarged, symmetric, no tenderness/mass/nodules Back: symmetric, no curvature. ROM normal. No CVA tenderness. Lungs: clear to auscultation bilaterally Heart: regular rate and rhythm, S1, S2 normal, no murmur, click, rub or gallop Abdomen: soft, non-tender; bowel sounds normal; no masses,  no organomegaly Pulses: 2+ and symmetric Skin: Skin color, texture, turgor normal. No rashes or lesions Lymph nodes: Cervical, supraclavicular, and axillary nodes normal.  Assessment & Plan:   Problem List Items Addressed This Visit      Unprioritized   Breast cancer screening    She has historically declined screening with mammogram based on confidence in her own breast exam. Given her goals of care which would include treatment, she has decided to resume screening with mammogram      Relevant Orders   MM 3D SCREEN BREAST  BILATERAL   CAD (coronary artery disease)    Reported by patient as non critical by a remote cardiac catheterization nearly ten years ago.  With no interim evaluation per patient preference (one office visit in 2012 by Dr Rockey Situ) she is asymptomatic .   Continue beta blocker , will recommend statin therapy  Lab Results  Component Value Date   CHOL 233 (H) 05/04/2020   HDL 65.30 05/04/2020   LDLCALC 135 (H) 05/04/2020   LDLDIRECT 133.0 04/02/2019   TRIG 166.0 (H) 05/04/2020   CHOLHDL 4 05/04/2020         Essential hypertension - Primary    Well  controlled on current regimen. Renal function stable, no changes today.  Lab Results  Component Value Date   CREATININE 0.75 05/04/2020   Lab Results  Component Value Date   NA 140 05/04/2020   K 4.3 05/04/2020   CL 104 05/04/2020   CO2 31 05/04/2020         Relevant Orders   Comprehensive metabolic panel (Completed)   Hyperlipidemia     Her  10 yr risk of events is calculated to be 25% based on today's lipids.  I have recommended statin therapy  Lab Results  Component Value Date   CHOL 233 (H) 05/04/2020   HDL 65.30 05/04/2020   LDLCALC 135 (H) 05/04/2020   LDLDIRECT 133.0 04/02/2019   TRIG 166.0 (H) 05/04/2020   CHOLHDL 4 05/04/2020        Relevant Orders   Lipid panel (Completed)   Hypothyroidism    Thyroid function is WNL on current dose.  No current changes needed.   Lab Results  Component Value Date   TSH 2.90 05/04/2020         Relevant Orders   TSH (Completed)   Obesity    I have addressed  BMI and recommended a low glycemic index diet utilizing smaller more frequent meals to increase metabolism.  I have also recommended that patient start exercising with a goal of 30 minutes of aerobic exercise a minimum of 5 days per week. Screening for lipid disorders, thyroid and diabetes to be done today.         Other Visit Diagnoses    Weight gain       Relevant Orders   Hemoglobin A1c (Completed)   Fatigue, unspecified type       Relevant Orders   CBC with Differential/Platelet (Completed)   Colon cancer screening       Relevant Orders   Cologuard      I am having Elnita Maxwell. [place] maintain her multivitamin, ALPRAZolam, levothyroxine, estradiol, and metoprolol succinate.  No orders of the defined types were placed in this encounter.   There are no discontinued medications.  Follow-up: Return in about 6 months (around 11/03/2020).   Crecencio Mc, MD

## 2020-05-05 NOTE — Assessment & Plan Note (Signed)
I have addressed  BMI and recommended a low glycemic index diet utilizing smaller more frequent meals to increase metabolism.  I have also recommended that patient start exercising with a goal of 30 minutes of aerobic exercise a minimum of 5 days per week. Screening for lipid disorders, thyroid and diabetes to be done today.   

## 2020-05-05 NOTE — Assessment & Plan Note (Signed)
Her  10 yr risk of events is calculated to be 25% based on today's lipids.  I have recommended statin therapy  Lab Results  Component Value Date   CHOL 233 (H) 05/04/2020   HDL 65.30 05/04/2020   LDLCALC 135 (H) 05/04/2020   LDLDIRECT 133.0 04/02/2019   TRIG 166.0 (H) 05/04/2020   CHOLHDL 4 05/04/2020

## 2020-05-05 NOTE — Assessment & Plan Note (Signed)
Well controlled on current regimen. Renal function stable, no changes today.  Lab Results  Component Value Date   CREATININE 0.75 05/04/2020   Lab Results  Component Value Date   NA 140 05/04/2020   K 4.3 05/04/2020   CL 104 05/04/2020   CO2 31 05/04/2020

## 2020-05-05 NOTE — Assessment & Plan Note (Signed)
Thyroid function is WNL on current dose.  No current changes needed.   Lab Results  Component Value Date   TSH 2.90 05/04/2020

## 2020-05-05 NOTE — Assessment & Plan Note (Signed)
Reported by patient as non critical by a remote cardiac catheterization nearly ten years ago.  With no interim evaluation per patient preference (one office visit in 2012 by Dr Rockey Situ) she is asymptomatic .   Continue beta blocker , will recommend statin therapy  Lab Results  Component Value Date   CHOL 233 (H) 05/04/2020   HDL 65.30 05/04/2020   LDLCALC 135 (H) 05/04/2020   LDLDIRECT 133.0 04/02/2019   TRIG 166.0 (H) 05/04/2020   CHOLHDL 4 05/04/2020

## 2020-05-09 IMAGING — MR MR BRAIN/IAC WO/W
13 of 14 series · 42 of 48 positions shown · IV contrast (9 ML GADAVIST)
Comparison: Brain MRI 10/20/2013.

CLINICAL DATA: 77-year-old female with progressive vertigo for 6-7
months. Symptoms began after a fall 2 years ago.

EXAM:
MRI HEAD WITHOUT AND WITH CONTRAST
TECHNIQUE: Multiplanar, multiecho pulse sequences of the brain and surrounding
structures were obtained without and with intravenous contrast.
CONTRAST:  9 milliliters Gadavist

[Series 2: T1 · sagittal · 5.0mm · 0.45mm/px · 3 of 23 slices shown (1 of 3)]
[im 1/23]
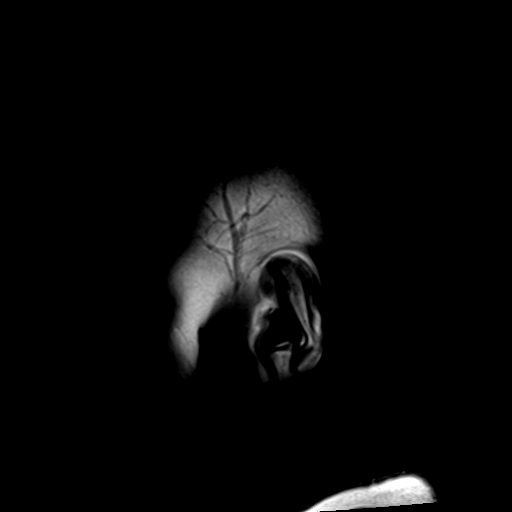
[im 12/23]
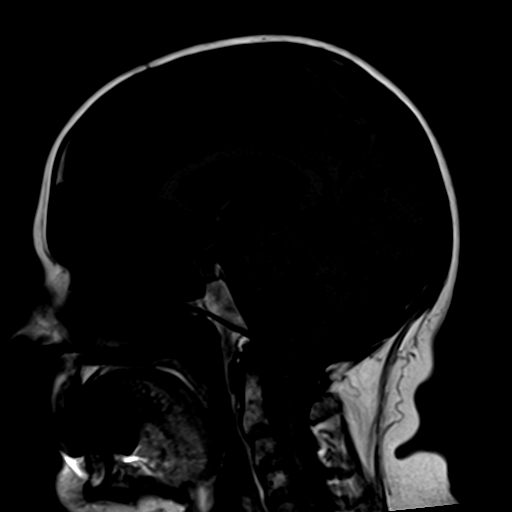
[im 23/23]
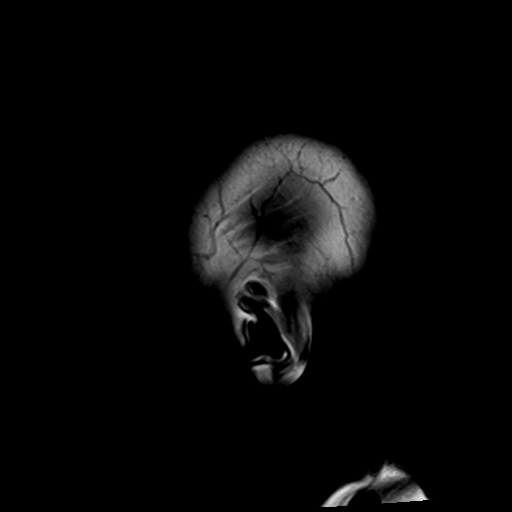

[Series 4: DWI · axial · 3.0mm · 1.20mm/px · z∈[-74,+91]mm · 5 of 56 slices shown (1 of 4)]
[im 1/56]
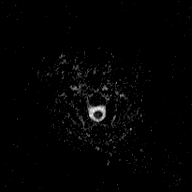
[im 14/56]
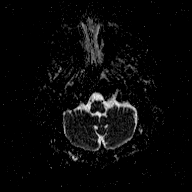
[im 28/56]
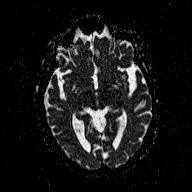
[im 42/56]
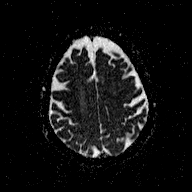
[im 56/56]
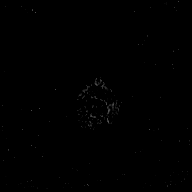

[Series 6: DWI · coronal · 3.0mm · 1.15mm/px · 5 of 47 slices shown (2 of 4)]
[im 1/47]
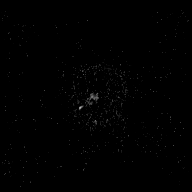
[im 12/47]
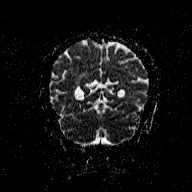
[im 24/47]
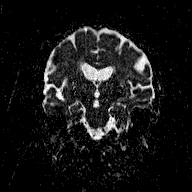
[im 35/47]
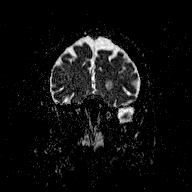
[im 47/47]
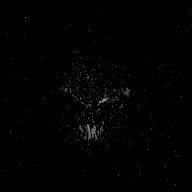

[Series 7: T2 · axial · 5.0mm · 0.72mm/px · z∈[-79,+96]mm · 2 of 26 slices shown (1 of 2)]
[im 1/26]
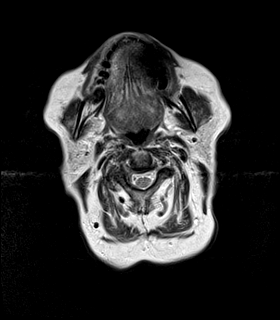
[im 26/26]
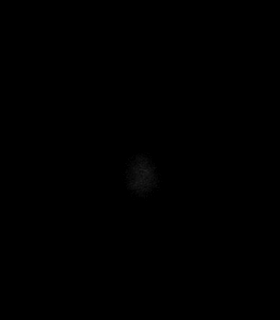

[Series 8: FLAIR · axial · 3.0mm · 0.45mm/px · z∈[-72,+90]mm · 5 of 55 slices shown]
[im 1/55]
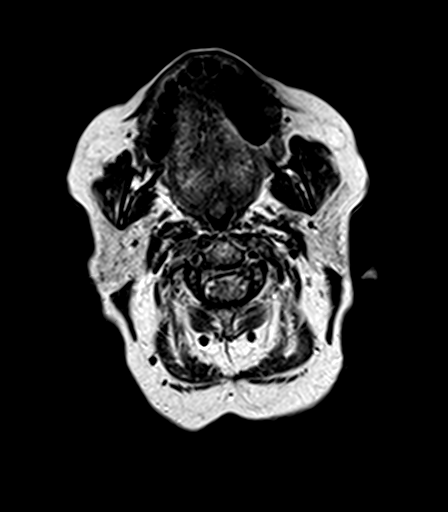
[im 14/55]
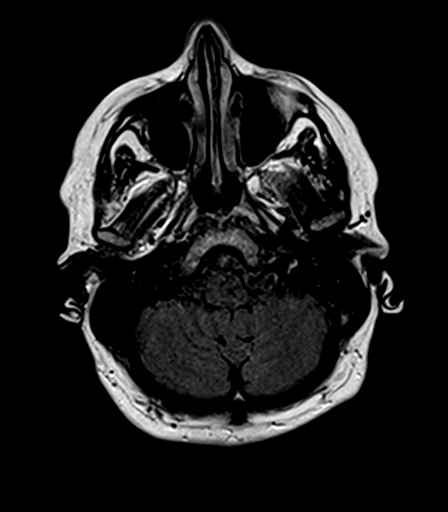
[im 28/55]
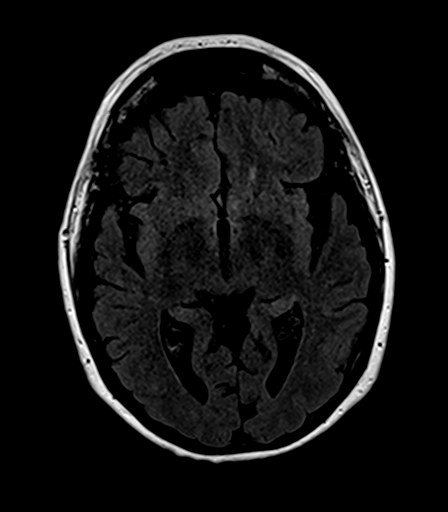
[im 41/55]
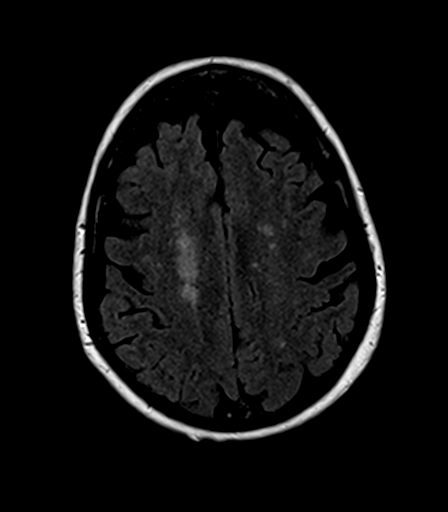
[im 55/55]
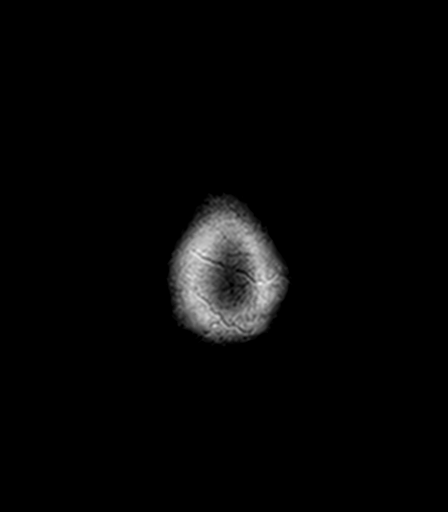

[Series 9: T2 · axial · 5.0mm · 0.72mm/px · z∈[-79,+96]mm · 2 of 26 slices shown (2 of 2)]
[im 1/26]
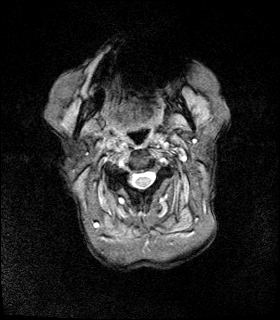
[im 26/26]
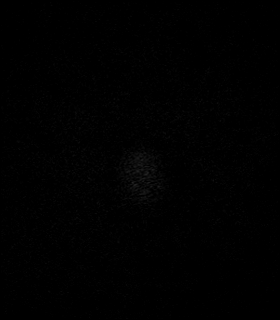

[Series 10: T1 · coronal · 3.0mm · 0.37mm/px · 1 of 11 slices shown (2 of 3)]
[im 1/11]
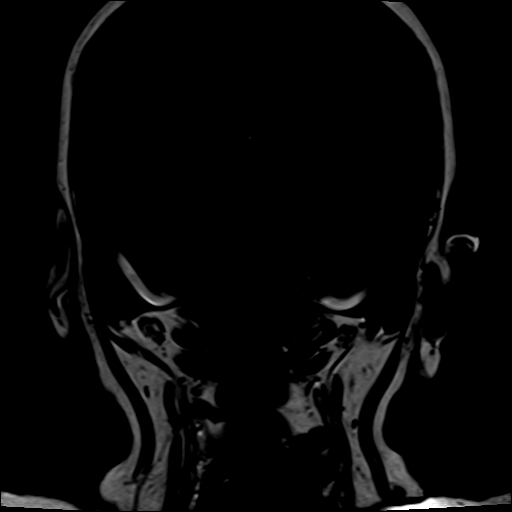

[Series 11: T1 · axial · 3.0mm · 0.37mm/px · 1 of 11 slices shown (3 of 3)]
[im 1/11]
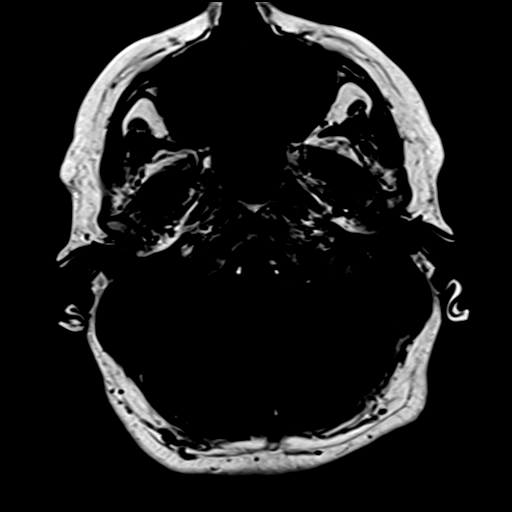

[Series 13: T1 post-contrast · axial · 3.0mm · 0.37mm/px · 1 of 11 slices shown (1 of 3)]
[im 1/11]
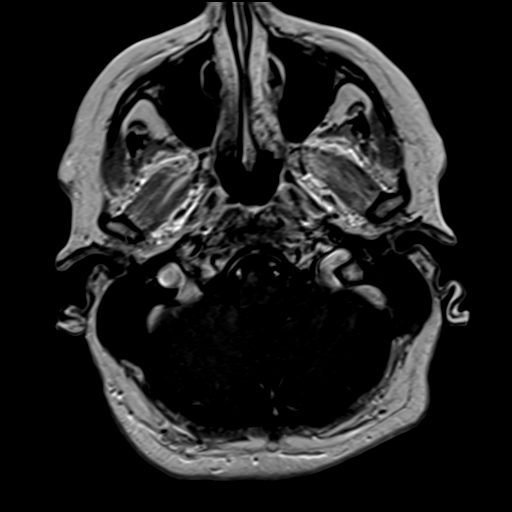

[Series 14: T1 post-contrast · coronal · 3.0mm · 0.37mm/px · 1 of 11 slices shown (2 of 3)]
[im 1/11]
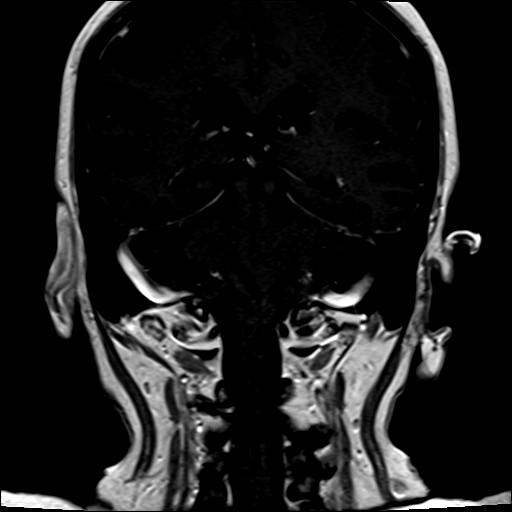

[Series 15: T1 post-contrast · axial · 3.0mm · 1.00mm/px · z∈[-80,+97]mm · 6 of 60 slices shown (3 of 3)]
[im 1/60]
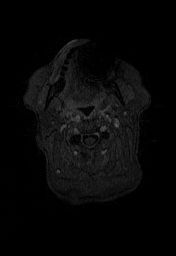
[im 12/60]
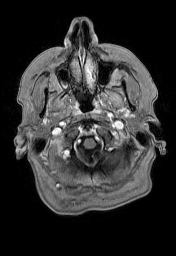
[im 24/60]
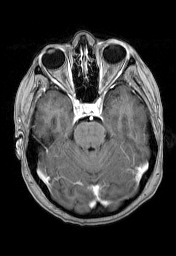
[im 36/60]
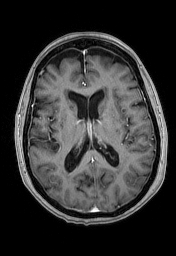
[im 48/60]
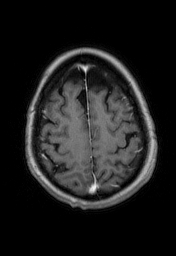
[im 60/60]
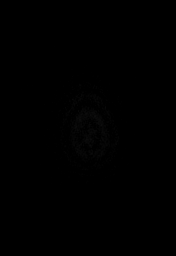

[Series 100: DWI · axial · 3.0mm · 1.20mm/px · z∈[-74,+91]mm · 5 of 56 slices shown (3 of 4)]
[im 1/56]
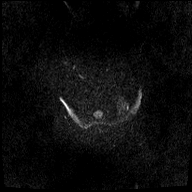
[im 14/56]
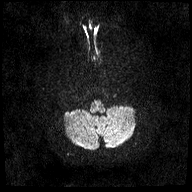
[im 28/56]
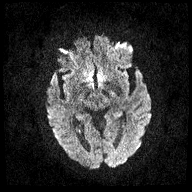
[im 42/56]
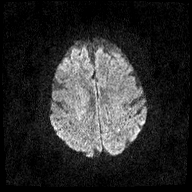
[im 56/56]
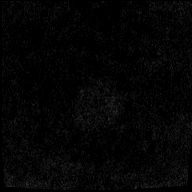

[Series 101: DWI · coronal · 3.0mm · 1.15mm/px · 5 of 47 slices shown (4 of 4)]
[im 1/47]
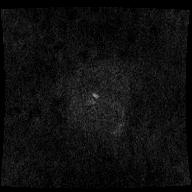
[im 12/47]
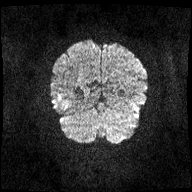
[im 24/47]
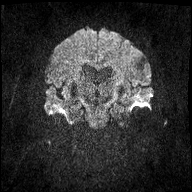
[im 35/47]
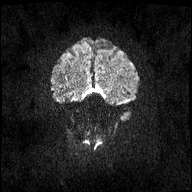
[im 47/47]
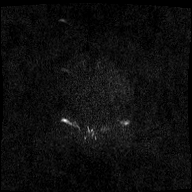

[42 of 48 positions shown; findings below may reference images not displayed]

FINDINGS: Brain: No restricted diffusion to suggest acute infarction. No
midline shift, mass effect, evidence of mass lesion,
ventriculomegaly, extra-axial collection or acute intracranial
hemorrhage. Cervicomedullary junction and pituitary are within
normal limits.

Mild generalized cerebral volume loss since 1230. Patchy and
scattered cerebral white matter T2 and FLAIR hyperintensity has also
mildly increased. No cortical encephalomalacia or chronic cerebral
blood products. Mild T2 heterogeneity in the pons appears stable.
Mild T2 heterogeneity in the bilateral basal ganglia has increased,
but appears partially due to perivascular spaces. Minor T2
heterogeneity in the thalami. The cerebellum remains normal. No
abnormal enhancement identified. No dural thickening.

Vascular: Major intracranial vascular flow voids are stable since
1230. Mild intracranial artery tortuosity.

Skull and upper cervical spine: Mild for age visible cervical spine
degeneration. Visualized bone marrow signal is within normal limits.

Sinuses/Orbits: Stable and negative.

Scalp and face soft tissues appear negative.

Other: Dedicated internal auditory canal imaging. Normal
cerebellopontine angles. Normal bilateral cisternal and
intracanalicular 7th and 8th cranial nerve segments. Symmetric and
normal appearing T2 signal in the bilateral cochlea and vestibular
structures. Bilateral mastoid air cells are stable and well
pneumatized. No abnormal enhancement identified. Normal stylomastoid
foramina. Both parotid glands appear negative.
IMPRESSION: 1. Negative internal auditory imaging.
2. No acute intracranial abnormality. Stable to mild progression
since 1230 of signal changes in the cerebral white matter, basal
ganglia, and pons suggestive of chronic small vessel disease.

## 2020-05-18 DIAGNOSIS — Z1211 Encounter for screening for malignant neoplasm of colon: Secondary | ICD-10-CM | POA: Diagnosis not present

## 2020-05-18 LAB — COLOGUARD: Cologuard: NEGATIVE

## 2020-05-25 ENCOUNTER — Telehealth: Payer: Self-pay | Admitting: Internal Medicine

## 2020-05-25 NOTE — Telephone Encounter (Signed)
The results of patient's cologuard is negative. Results abstracted and MyChart message sent  

## 2020-06-09 DIAGNOSIS — E8940 Asymptomatic postprocedural ovarian failure: Secondary | ICD-10-CM | POA: Insufficient documentation

## 2020-06-10 ENCOUNTER — Ambulatory Visit: Payer: PPO | Admitting: Podiatry

## 2020-06-10 ENCOUNTER — Ambulatory Visit (INDEPENDENT_AMBULATORY_CARE_PROVIDER_SITE_OTHER): Payer: PPO

## 2020-06-10 ENCOUNTER — Other Ambulatory Visit: Payer: Self-pay

## 2020-06-10 ENCOUNTER — Ambulatory Visit (INDEPENDENT_AMBULATORY_CARE_PROVIDER_SITE_OTHER): Payer: PPO | Admitting: Podiatry

## 2020-06-10 ENCOUNTER — Encounter: Payer: Self-pay | Admitting: Podiatry

## 2020-06-10 DIAGNOSIS — M778 Other enthesopathies, not elsewhere classified: Secondary | ICD-10-CM

## 2020-06-10 MED ORDER — BETAMETHASONE SOD PHOS & ACET 6 (3-3) MG/ML IJ SUSP
3.0000 mg | Freq: Once | INTRAMUSCULAR | Status: AC
  Administered 2020-06-10: 3 mg via INTRA_ARTICULAR

## 2020-06-10 MED ORDER — MELOXICAM 15 MG PO TABS: 15.0000 mg | ORAL_TABLET | Freq: Every day | ORAL | 1 refills | Status: DC

## 2020-06-10 NOTE — Progress Notes (Signed)
   HPI: 80 y.o. female presenting today as a reestablish new patient for evaluation of left foot pain is been going on for few months now.  She denies a history of injury.  She states that her left foot has increasingly become painful especially with walking.  She has not done anything for treatment.  Past Medical History:  Diagnosis Date  . Hyperlipidemia   . Hypertension   . Hypothyroidism      Physical Exam: General: The patient is alert and oriented x3 in no acute distress.  Dermatology: Skin is warm, dry and supple bilateral lower extremities. Negative for open lesions or macerations.  Vascular: Palpable pedal pulses bilaterally. No edema or erythema noted. Capillary refill within normal limits.  Neurological: Epicritic and protective threshold grossly intact bilaterally.   Musculoskeletal Exam: Range of motion within normal limits to all pedal and ankle joints bilateral. Muscle strength 5/5 in all groups bilateral.  Pain on palpation along the TMT joint left foot  Radiographic Exam:  Normal osseous mineralization. Joint spaces preserved. No fracture/dislocation/boney destruction.    Assessment: 1.  Capsulitis tarsometatarsal joint left overlying metatarsals 3 and 4   Plan of Care:  1. Patient evaluated. X-Rays reviewed.  2.  Injection of 0.5 cc Celestone Soluspan injection of the TMT joint left foot 3.  Prescription for meloxicam 15 mg daily 4.  Patient admits to going barefoot.  Recommend good supportive shoes even around the house 5.  Return to clinic as needed.  Patient would prefer not to make a follow-up appointment  *Going to [place] September 2022      Edrick Kins, DPM [facility]  Dr. Edrick Kins, DPM    [address], [place] [address]                Office [phone]  Fax [phone]

## 2020-07-12 ENCOUNTER — Ambulatory Visit (INDEPENDENT_AMBULATORY_CARE_PROVIDER_SITE_OTHER): Payer: PPO | Admitting: Podiatry

## 2020-07-12 ENCOUNTER — Encounter: Payer: Self-pay | Admitting: Podiatry

## 2020-07-12 ENCOUNTER — Other Ambulatory Visit: Payer: Self-pay

## 2020-07-12 DIAGNOSIS — M778 Other enthesopathies, not elsewhere classified: Secondary | ICD-10-CM | POA: Diagnosis not present

## 2020-07-12 MED ORDER — BETAMETHASONE SOD PHOS & ACET 6 (3-3) MG/ML IJ SUSP
3.0000 mg | Freq: Once | INTRAMUSCULAR | Status: AC
  Administered 2020-07-12: 3 mg via INTRA_ARTICULAR

## 2020-07-12 NOTE — Progress Notes (Signed)
   HPI: 80 y.o. female presenting today a for follow-up evaluation of left foot pain.  Patient states that the last injection did not significantly help.  She continues to have pain despite taking meloxicam 15 mg daily.  She presents for follow-up treatment and evaluation  Past Medical History:  Diagnosis Date   Hyperlipidemia    Hypertension    Hypothyroidism      Physical Exam: General: The patient is alert and oriented x3 in no acute distress.  Dermatology: Skin is warm, dry and supple bilateral lower extremities. Negative for open lesions or macerations.  Vascular: Palpable pedal pulses bilaterally. No edema or erythema noted. Capillary refill within normal limits.  Neurological: Epicritic and protective threshold grossly intact bilaterally.   Musculoskeletal Exam: Range of motion within normal limits to all pedal and ankle joints bilateral. Muscle strength 5/5 in all groups bilateral.  Pain on palpation along the TMT joint left foot  Assessment: 1.  Capsulitis tarsometatarsal joint left overlying metatarsals 3 and 4   Plan of Care:  1. Patient evaluated. X-Rays reviewed.  2.  Injection of 0.5 cc Celestone Soluspan injection of the TMT joint left foot 3.  Continue meloxicam 15 mg daily 4.  Patient admits to going barefoot.  Recommend good supportive shoes even around the house 5.  Today were going to be more aggressive.  Cam boot dispensed today weightbearing as tolerated x3 weeks 6.  Return to clinic in 3 weeks  *Going to [place] September 2022      Edrick Kins, DPM [facility]  Dr. Edrick Kins, DPM    [address], [place] [address]                Office [phone]  Fax [phone]

## 2020-08-05 ENCOUNTER — Other Ambulatory Visit: Payer: Self-pay

## 2020-08-05 ENCOUNTER — Encounter: Payer: Self-pay | Admitting: Podiatry

## 2020-08-05 ENCOUNTER — Ambulatory Visit (INDEPENDENT_AMBULATORY_CARE_PROVIDER_SITE_OTHER): Payer: PPO | Admitting: Podiatry

## 2020-08-05 DIAGNOSIS — M778 Other enthesopathies, not elsewhere classified: Secondary | ICD-10-CM

## 2020-08-05 MED ORDER — MELOXICAM 15 MG PO TABS: 15.0000 mg | ORAL_TABLET | Freq: Every day | ORAL | 1 refills | Status: DC

## 2020-08-05 NOTE — Progress Notes (Signed)
   HPI: 80 y.o. female presenting today a for follow-up evaluation of left foot capsulitis of the TMT joint overlying the metatarsals 3 and 4.  Patient states that she no longer experiences any pain.  She may have an occasional twinge here and there.  Patient discontinued wearing the cam boot yesterday.  She states that she is doing much better and tennis shoes.  No new complaints at this time  Past Medical History:  Diagnosis Date   Hyperlipidemia    Hypertension    Hypothyroidism      Physical Exam: General: The patient is alert and oriented x3 in no acute distress.  Dermatology: Skin is warm, dry and supple bilateral lower extremities. Negative for open lesions or macerations.  Vascular: Palpable pedal pulses bilaterally. No edema or erythema noted. Capillary refill within normal limits.  Neurological: Epicritic and protective threshold grossly intact bilaterally.   Musculoskeletal Exam: Range of motion within normal limits to all pedal and ankle joints bilateral. Muscle strength 5/5 in all groups bilateral.  Negative for any significant pain on palpation along the TMT joint left foot  Assessment: 1.  Capsulitis tarsometatarsal joint left overlying metatarsals 3 and 4   Plan of Care:  1. Patient evaluated.  2.  Patient may discontinue the cam boot.  Recommend good supportive sneakers 3.  Refill prescription for meloxicam 15 mg daily as needed 4.  Return to clinic as needed  *Going to [place] September 2022      Edrick Kins, DPM [facility]  Dr. Edrick Kins, DPM    [address], [place] [address]                Office [phone]  Fax [phone]

## 2020-08-25 DIAGNOSIS — M5387 Other specified dorsopathies, lumbosacral region: Secondary | ICD-10-CM | POA: Diagnosis not present

## 2020-08-25 DIAGNOSIS — M9904 Segmental and somatic dysfunction of sacral region: Secondary | ICD-10-CM | POA: Diagnosis not present

## 2020-08-25 DIAGNOSIS — M9903 Segmental and somatic dysfunction of lumbar region: Secondary | ICD-10-CM | POA: Diagnosis not present

## 2020-09-09 DIAGNOSIS — L718 Other rosacea: Secondary | ICD-10-CM | POA: Diagnosis not present

## 2020-09-09 DIAGNOSIS — L570 Actinic keratosis: Secondary | ICD-10-CM | POA: Diagnosis not present

## 2020-09-09 DIAGNOSIS — I781 Nevus, non-neoplastic: Secondary | ICD-10-CM | POA: Diagnosis not present

## 2020-09-09 DIAGNOSIS — D225 Melanocytic nevi of trunk: Secondary | ICD-10-CM | POA: Diagnosis not present

## 2020-09-09 DIAGNOSIS — L578 Other skin changes due to chronic exposure to nonionizing radiation: Secondary | ICD-10-CM | POA: Diagnosis not present

## 2020-10-07 DIAGNOSIS — M2142 Flat foot [pes planus] (acquired), left foot: Secondary | ICD-10-CM | POA: Diagnosis not present

## 2020-10-16 ENCOUNTER — Other Ambulatory Visit: Payer: Self-pay | Admitting: Internal Medicine

## 2020-10-19 ENCOUNTER — Other Ambulatory Visit: Payer: Self-pay | Admitting: Internal Medicine

## 2020-10-31 DIAGNOSIS — M9904 Segmental and somatic dysfunction of sacral region: Secondary | ICD-10-CM | POA: Diagnosis not present

## 2020-10-31 DIAGNOSIS — M9903 Segmental and somatic dysfunction of lumbar region: Secondary | ICD-10-CM | POA: Diagnosis not present

## 2020-10-31 DIAGNOSIS — M5387 Other specified dorsopathies, lumbosacral region: Secondary | ICD-10-CM | POA: Diagnosis not present

## 2020-10-31 DIAGNOSIS — M9902 Segmental and somatic dysfunction of thoracic region: Secondary | ICD-10-CM | POA: Diagnosis not present

## 2020-10-31 DIAGNOSIS — M5383 Other specified dorsopathies, cervicothoracic region: Secondary | ICD-10-CM | POA: Diagnosis not present

## 2020-10-31 DIAGNOSIS — M9901 Segmental and somatic dysfunction of cervical region: Secondary | ICD-10-CM | POA: Diagnosis not present

## 2020-11-03 ENCOUNTER — Other Ambulatory Visit: Payer: Self-pay

## 2020-11-03 ENCOUNTER — Ambulatory Visit: Payer: PPO

## 2020-11-03 ENCOUNTER — Encounter: Payer: Self-pay | Admitting: Internal Medicine

## 2020-11-03 ENCOUNTER — Ambulatory Visit (INDEPENDENT_AMBULATORY_CARE_PROVIDER_SITE_OTHER): Payer: PPO | Admitting: Internal Medicine

## 2020-11-03 VITALS — BP 132/76 | HR 54 | Temp 95.4°F | Ht 62.0 in | Wt 181.8 lb

## 2020-11-03 DIAGNOSIS — Z6837 Body mass index (BMI) 37.0-37.9, adult: Secondary | ICD-10-CM

## 2020-11-03 DIAGNOSIS — I700 Atherosclerosis of aorta: Secondary | ICD-10-CM

## 2020-11-03 DIAGNOSIS — E782 Mixed hyperlipidemia: Secondary | ICD-10-CM | POA: Diagnosis not present

## 2020-11-03 DIAGNOSIS — I10 Essential (primary) hypertension: Secondary | ICD-10-CM

## 2020-11-03 DIAGNOSIS — Z23 Encounter for immunization: Secondary | ICD-10-CM | POA: Diagnosis not present

## 2020-11-03 LAB — COMPREHENSIVE METABOLIC PANEL
ALT: 9 U/L (ref 0–35)
AST: 18 U/L (ref 0–37)
Albumin: 3.7 g/dL (ref 3.5–5.2)
Alkaline Phosphatase: 66 U/L (ref 39–117)
BUN: 15 mg/dL (ref 6–23)
CO2: 32 mEq/L (ref 19–32)
Calcium: 9.3 mg/dL (ref 8.4–10.5)
Chloride: 103 mEq/L (ref 96–112)
Creatinine, Ser: 0.71 mg/dL (ref 0.40–1.20)
GFR: 81.25 mL/min (ref >60.00)
Glucose, Bld: 85 mg/dL (ref 70–99)
Potassium: 4.3 mEq/L (ref 3.5–5.1)
Sodium: 139 mEq/L (ref 135–145)
Total Bilirubin: 0.4 mg/dL (ref 0.2–1.2)
Total Protein: 6 g/dL (ref 6.0–8.3)

## 2020-11-03 LAB — LIPID PANEL
Cholesterol: 242 mg/dL — ABNORMAL HIGH (ref 0–200)
HDL: 55.2 mg/dL (ref >39.00)
NonHDL: 186.31
Total CHOL/HDL Ratio: 4
Triglycerides: 220 mg/dL — ABNORMAL HIGH (ref 0.0–149.0)
VLDL: 44 mg/dL — ABNORMAL HIGH (ref 0.0–40.0)

## 2020-11-03 LAB — LDL CHOLESTEROL, DIRECT: Direct LDL: 155 mg/dL

## 2020-11-03 MED ORDER — ROSUVASTATIN CALCIUM 10 MG PO TABS
10.0000 mg | ORAL_TABLET | ORAL | 0 refills | Status: DC
Start: 2020-11-03 — End: 2021-01-31

## 2020-11-03 NOTE — Patient Instructions (Signed)
I am recommending a trial of generic Crestor when you return from [place]  This is because your risk is elevated based on 1) your last lipid panel AND 2) the fact that aortic atherosclerosis was noted on a 2017 chest x ray ,  which places you at increased risk for heart attack and stroke  .  Start with one tablet once a week  (NOT DAILY)  If you tolerate taking IT  TWICE A WEEK .   AFTER A FEW WEEKS increase to every other day    RETURN FOR A CMET AFTER 3 WEEKS OF THERAPY  TO CHECK LIVER ENZYMES

## 2020-11-03 NOTE — Progress Notes (Signed)
Subjective:  Patient ID: Meagan Cox, female    DOB: 03-02-1940  Age: 80 y.o. MRN: [id number]  CC: The primary encounter diagnosis was Mixed hyperlipidemia. Diagnoses of Need for immunization against influenza, Aortic atherosclerosis ([place]), Class 2 severe obesity due to excess calories with serious comorbidity and body mass index (BMI) of 37.0 to 37.9 in adult [facility]), and Essential hypertension were also pertinent to this visit.  HPI Mar Zettler presents for  Chief Complaint  Patient presents with   Follow-up    6 month follow up on hypertension and hypothyroidism    This visit occurred during the SARS-CoV-2 public health emergency.  Safety protocols were in place, including screening questions prior to the visit, additional usage of staff PPE, and extensive cleaning of exam room while observing appropriate contact time as indicated for disinfecting solutions.    1) obesity:  down 8 lbs since April . Daughter is s/p bariatric surgery and since they eat together she has been eating better.  Also notes that her appetite has been reduced due to a sudden alteration in her sense of taste that occurred several years ago. 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   2) aortic atherosclerosis:  noted on 2017 chest x ray . Discussed the prognostic implications of this finding. .  She is puzzled because her diet is healthy . She is apprehensive about a trial.  Maternal GM died of "hardening of the arteries "    3) Travel plans:  leaving with her daughter for a 3 week trip to [place]  next  week  that has been 3 years in the making.  Flying to [place],  then to [place] to [place] the [place] to [place] and [place] ,   22 days total. Took the new Moderna covid booster last Sunday .  4) HTN: home readings 120/70  to 130/80 at home.  Has occasional light headedness but thinks it may be her blood sugar d=since it improves with eating    Outpatient Medications Prior to Visit  Medication Sig Dispense Refill    ALPRAZolam (XANAX) 0.5 MG tablet Take 0.5 mg by mouth at bedtime as needed for anxiety.     estradiol (ESTRACE) 2 MG tablet TAKE 1 TABLET(2 MG) BY MOUTH DAILY 90 tablet 1   levothyroxine (SYNTHROID) 100 MCG tablet TAKE 1 TABLET(100 MCG) BY MOUTH DAILY 90 tablet 3   metoprolol succinate (TOPROL-XL) 25 MG 24 hr tablet TAKE 1 TABLET(25 MG) BY MOUTH DAILY 90 tablet 1   metroNIDAZOLE (METROCREAM) 0.75 % cream Apply topically daily.     Multiple Vitamin (MULTIVITAMIN) tablet Take 1 tablet by mouth daily.     meloxicam (MOBIC) 15 MG tablet Take 1 tablet (15 mg total) by mouth daily. (Patient not taking: Reported on 11/03/2020) 30 tablet 1   No facility-administered medications prior to visit.    Review of Systems;  Patient denies headache, fevers, malaise, unintentional weight loss, skin rash, eye pain, sinus congestion and sinus pain, sore throat, dysphagia,  hemoptysis , cough, dyspnea, wheezing, chest pain, palpitations, orthopnea, edema, abdominal pain, nausea, melena, diarrhea, constipation, flank pain, dysuria, hematuria, urinary  Frequency, nocturia, numbness, tingling, seizures,  Focal weakness, Loss of consciousness,  Tremor, insomnia, depression, anxiety, and suicidal ideation.      Objective:  BP 132/76 (BP Location: Left Arm, Patient Position: Sitting, Cuff Size: Normal)   Pulse (!) 54   Temp (!) 95.4 F (35.2 C) (Temporal)   Ht 5\' 2"  (1.575 m)  Wt 181 lb 12.8 oz (82.5 kg)   SpO2 96%   BMI 33.25 kg/m   BP Readings from Last 3 Encounters:  11/03/20 132/76  05/04/20 120/74  04/02/19 134/80    Wt Readings from Last 3 Encounters:  11/03/20 181 lb 12.8 oz (82.5 kg)  05/04/20 189 lb 12.8 oz (86.1 kg)  04/02/19 188 lb (85.3 kg)    General appearance: alert, cooperative and appears stated age Ears: normal TM's and external ear canals both ears Throat: lips, mucosa, and tongue normal; teeth and gums normal Neck: no adenopathy, no carotid bruit, supple, symmetrical, trachea  midline and thyroid not enlarged, symmetric, no tenderness/mass/nodules Back: symmetric, no curvature. ROM normal. No CVA tenderness. Lungs: clear to auscultation bilaterally Heart: regular rate and rhythm, S1, S2 normal, no murmur, click, rub or gallop Abdomen: soft, non-tender; bowel sounds normal; no masses,  no organomegaly Pulses: 2+ and symmetric Skin: Skin color, texture, turgor normal. No rashes or lesions Lymph nodes: Cervical, supraclavicular, and axillary nodes normal.  Lab Results  Component Value Date   HGBA1C 5.0 05/04/2020   HGBA1C 5.1 04/02/2019    Lab Results  Component Value Date   CREATININE 0.71 11/03/2020   CREATININE 0.75 05/04/2020   CREATININE 0.70 04/02/2019    Lab Results  Component Value Date   WBC 7.9 05/04/2020   HGB 14.9 05/04/2020   HCT 44.5 05/04/2020   PLT 262.0 05/04/2020   GLUCOSE 85 11/03/2020   CHOL 242 (H) 11/03/2020   TRIG 220.0 (H) 11/03/2020   HDL 55.20 11/03/2020   LDLDIRECT 155.0 11/03/2020   LDLCALC 135 (H) 05/04/2020   ALT 9 11/03/2020   AST 18 11/03/2020   NA 139 11/03/2020   K 4.3 11/03/2020   CL 103 11/03/2020   CREATININE 0.71 11/03/2020   BUN 15 11/03/2020   CO2 32 11/03/2020   TSH 2.90 05/04/2020   HGBA1C 5.0 05/04/2020    No results found.  Assessment & Plan:   Problem List Items Addressed This Visit       Unprioritized   Aortic atherosclerosis ([place])    Aortic atherosclerosis :  Discussed need for statin therapy given documented evidence of moderate  atherosclerosis in the aorta noted on recent  Imaging study,.  She remains contemplative given her history of statin myalgias and clean diet. However given her untreated LDL of 155 ,  She is willing to undergo  Trial of rosuvastatin 2 times per week    Lab Results  Component Value Date   CHOL 242 (H) 11/03/2020   HDL 55.20 11/03/2020   LDLCALC 135 (H) 05/04/2020   LDLDIRECT 155.0 11/03/2020   TRIG 220.0 (H) 11/03/2020   CHOLHDL 4 11/03/2020          Relevant Medications   rosuvastatin (CRESTOR) 10 MG tablet   Essential hypertension    Well controlled on current regimen. Renal function stable, no changes today. Continue  Toprol  XL given history of CAD .      Relevant Medications   rosuvastatin (CRESTOR) 10 MG tablet   Hyperlipidemia - Primary     Her  10 yr risk of events is calculated to be > 25% based on  recent lipids and she has aortic atherosclerosis .   I have recommended statin therapy  Lab Results  Component Value Date   CHOL 242 (H) 11/03/2020   HDL 55.20 11/03/2020   LDLCALC 135 (H) 05/04/2020   LDLDIRECT 155.0 11/03/2020   TRIG 220.0 (H) 11/03/2020   CHOLHDL 4  11/03/2020       Relevant Medications   rosuvastatin (CRESTOR) 10 MG tablet   Other Relevant Orders   Comprehensive metabolic panel (Completed)   Lipid panel (Completed)   Comprehensive metabolic panel   LDL cholesterol, direct (Completed)   Obesity    30 lb wt loss since Feb 2019.  following a low GI diet , BMI reduced from 37 to 32      Other Visit Diagnoses     Need for immunization against influenza       Relevant Orders   Flu Vaccine QUAD High Dose(Fluad) (Completed)        Meds ordered this encounter  Medications   rosuvastatin (CRESTOR) 10 MG tablet    Sig: Take 1 tablet (10 mg total) by mouth every other day.    Dispense:  45 tablet    Refill:  0    Medications Discontinued During This Encounter  Medication Reason   meloxicam (MOBIC) 15 MG tablet     Follow-up: Return in about 6 months (around 05/04/2021).   Crecencio Mc, MD

## 2020-11-05 DIAGNOSIS — I700 Atherosclerosis of aorta: Secondary | ICD-10-CM | POA: Insufficient documentation

## 2020-11-05 NOTE — Assessment & Plan Note (Addendum)
Her  10 yr risk of events is calculated to be > 25% based on  recent lipids and she has aortic atherosclerosis .   I have recommended statin therapy  Lab Results  Component Value Date   CHOL 242 (H) 11/03/2020   HDL 55.20 11/03/2020   LDLCALC 135 (H) 05/04/2020   LDLDIRECT 155.0 11/03/2020   TRIG 220.0 (H) 11/03/2020   CHOLHDL 4 11/03/2020

## 2020-11-05 NOTE — Assessment & Plan Note (Addendum)
30 lb wt loss since Feb 2019.  following a low GI diet , BMI reduced from 37 to 32 

## 2020-11-05 NOTE — Assessment & Plan Note (Addendum)
Aortic atherosclerosis :  Discussed need for statin therapy given documented evidence of moderate  atherosclerosis in the aorta noted on recent  Imaging study,.  She remains contemplative given her history of statin myalgias and clean diet. However given her untreated LDL of 155 ,  She is willing to undergo  Trial of rosuvastatin 2 times per week    Lab Results  Component Value Date   CHOL 242 (H) 11/03/2020   HDL 55.20 11/03/2020   LDLCALC 135 (H) 05/04/2020   LDLDIRECT 155.0 11/03/2020   TRIG 220.0 (H) 11/03/2020   CHOLHDL 4 11/03/2020

## 2020-11-05 NOTE — Assessment & Plan Note (Signed)
Well controlled on current regimen. Renal function stable, no changes today. Continue  Toprol  XL given history of CAD . 

## 2020-12-06 ENCOUNTER — Ambulatory Visit (INDEPENDENT_AMBULATORY_CARE_PROVIDER_SITE_OTHER): Payer: PPO | Admitting: Podiatry

## 2020-12-06 ENCOUNTER — Other Ambulatory Visit: Payer: Self-pay

## 2020-12-06 DIAGNOSIS — M778 Other enthesopathies, not elsewhere classified: Secondary | ICD-10-CM

## 2020-12-06 MED ORDER — BETAMETHASONE SOD PHOS & ACET 6 (3-3) MG/ML IJ SUSP
3.0000 mg | Freq: Once | INTRAMUSCULAR | Status: AC
  Administered 2020-12-06: 3 mg via INTRA_ARTICULAR

## 2020-12-06 NOTE — Progress Notes (Signed)
   HPI: 80 y.o. female presenting today a for follow-up evaluation of left foot pain.  Patient continues to have pain and tenderness to the lateral aspect of the TMT joint left foot.  She presents for follow-up treatment and evaluation  Past Medical History:  Diagnosis Date   Hyperlipidemia    Hypertension    Hypothyroidism      Physical Exam: General: The patient is alert and oriented x3 in no acute distress.  Dermatology: Skin is warm, dry and supple bilateral lower extremities. Negative for open lesions or macerations.  Vascular: Palpable pedal pulses bilaterally. No edema or erythema noted. Capillary refill within normal limits.  Neurological: Epicritic and protective threshold grossly intact bilaterally.   Musculoskeletal Exam: Range of motion within normal limits to all pedal and ankle joints bilateral. Muscle strength 5/5 in all groups bilateral.  Pain on palpation along the TMT joint left foot  Assessment: 1.  Capsulitis tarsometatarsal joint left overlying metatarsals 3 and 4   Plan of Care:  1. Patient evaluated. X-Rays reviewed.  2.  Injection of 0.5 cc Celestone Soluspan injection of the TMT joint left foot 3.  Patient states the meloxicam did not help.  She has been applying topical Voltaren.  Continue as needed 4.  Patient admits to going barefoot.  Recommend good supportive shoes even around the house 5.  Patient states that the cam boot only aggravated her foot more. 6.  Return to clinic as needed       Edrick Kins, DPM [facility]  Dr. Edrick Kins, DPM    [address], [place] [address]                Office [phone]  Fax [phone]

## 2020-12-15 ENCOUNTER — Ambulatory Visit: Payer: BC Managed Care – PPO

## 2020-12-26 ENCOUNTER — Other Ambulatory Visit (INDEPENDENT_AMBULATORY_CARE_PROVIDER_SITE_OTHER): Payer: PPO

## 2020-12-26 ENCOUNTER — Other Ambulatory Visit: Payer: Self-pay

## 2020-12-26 DIAGNOSIS — E782 Mixed hyperlipidemia: Secondary | ICD-10-CM | POA: Diagnosis not present

## 2020-12-26 LAB — COMPREHENSIVE METABOLIC PANEL
ALT: 9 U/L (ref 0–35)
AST: 14 U/L (ref 0–37)
Albumin: 3.9 g/dL (ref 3.5–5.2)
Alkaline Phosphatase: 56 U/L (ref 39–117)
BUN: 19 mg/dL (ref 6–23)
CO2: 30 mEq/L (ref 19–32)
Calcium: 9.3 mg/dL (ref 8.4–10.5)
Chloride: 101 mEq/L (ref 96–112)
Creatinine, Ser: 0.75 mg/dL (ref 0.40–1.20)
GFR: 76 mL/min (ref >60.00)
Glucose, Bld: 84 mg/dL (ref 70–99)
Potassium: 4.5 mEq/L (ref 3.5–5.1)
Sodium: 140 mEq/L (ref 135–145)
Total Bilirubin: 0.5 mg/dL (ref 0.2–1.2)
Total Protein: 6.2 g/dL (ref 6.0–8.3)

## 2021-01-31 ENCOUNTER — Other Ambulatory Visit: Payer: Self-pay | Admitting: Internal Medicine

## 2021-01-31 MED ORDER — ROSUVASTATIN CALCIUM 10 MG PO TABS: 10.0000 mg | ORAL_TABLET | ORAL | 3 refills | Status: DC

## 2021-02-16 ENCOUNTER — Other Ambulatory Visit: Payer: Self-pay | Admitting: Internal Medicine

## 2021-03-02 ENCOUNTER — Other Ambulatory Visit: Payer: Self-pay | Admitting: Internal Medicine

## 2021-04-04 ENCOUNTER — Encounter: Payer: Self-pay | Admitting: Internal Medicine

## 2021-04-05 ENCOUNTER — Encounter: Payer: Self-pay | Admitting: Internal Medicine

## 2021-04-05 DIAGNOSIS — T466X5A Adverse effect of antihyperlipidemic and antiarteriosclerotic drugs, initial encounter: Secondary | ICD-10-CM | POA: Insufficient documentation

## 2021-04-05 DIAGNOSIS — G720 Drug-induced myopathy: Secondary | ICD-10-CM | POA: Insufficient documentation

## 2021-04-17 ENCOUNTER — Other Ambulatory Visit: Payer: Self-pay | Admitting: Internal Medicine

## 2021-04-21 ENCOUNTER — Other Ambulatory Visit: Payer: Self-pay | Admitting: Internal Medicine

## 2021-05-08 ENCOUNTER — Ambulatory Visit: Payer: BC Managed Care – PPO | Admitting: Internal Medicine

## 2021-05-18 DIAGNOSIS — M5383 Other specified dorsopathies, cervicothoracic region: Secondary | ICD-10-CM | POA: Diagnosis not present

## 2021-05-18 DIAGNOSIS — M9901 Segmental and somatic dysfunction of cervical region: Secondary | ICD-10-CM | POA: Diagnosis not present

## 2021-05-18 DIAGNOSIS — M5387 Other specified dorsopathies, lumbosacral region: Secondary | ICD-10-CM | POA: Diagnosis not present

## 2021-05-18 DIAGNOSIS — M9903 Segmental and somatic dysfunction of lumbar region: Secondary | ICD-10-CM | POA: Diagnosis not present

## 2021-05-18 DIAGNOSIS — M9902 Segmental and somatic dysfunction of thoracic region: Secondary | ICD-10-CM | POA: Diagnosis not present

## 2021-05-18 DIAGNOSIS — M9904 Segmental and somatic dysfunction of sacral region: Secondary | ICD-10-CM | POA: Diagnosis not present

## 2021-05-19 ENCOUNTER — Telehealth: Payer: Self-pay | Admitting: Internal Medicine

## 2021-05-19 NOTE — Telephone Encounter (Signed)
Copied from [place] [phone]. Topic: Medicare AWV ?>> May 19, 2021  1:25 PM Harris-Coley, Hannah Beat wrote: ?Reason for CRM: Left message for patient to schedule Annual Wellness Visit.  Please schedule with Nurse Health Advisor Denisa O'Brien-Blaney, LPN at [facility].  Please call [phone] ask for Juliann Pulse ?

## 2021-06-29 ENCOUNTER — Telehealth: Payer: Self-pay | Admitting: Internal Medicine

## 2021-06-29 NOTE — Telephone Encounter (Signed)
Copied from [place] [phone]. Topic: Medicare AWV >> Jun 29, 2021  1:50 PM Harris-Coley, Hannah Beat wrote: Reason for CRM: Left message for patient to schedule Annual Wellness Visit.  Please schedule with Nurse Health Advisor Denisa O'Brien-Blaney, LPN at [facility].  Please call [phone] ask for [facility]

## 2021-07-19 ENCOUNTER — Other Ambulatory Visit: Payer: Self-pay | Admitting: Internal Medicine

## 2021-08-28 ENCOUNTER — Telehealth: Payer: Self-pay | Admitting: Internal Medicine

## 2021-08-28 NOTE — Telephone Encounter (Signed)
Copied from [name]. Topic: Medicare AWV >> Aug 28, 2021  1:57 PM [name] wrote: Reason for CRM: Left message for patient to schedule Annual Wellness Visit.  Please schedule with Nurse Health Advisor Denisa O'Brien-Blaney, LPN at [facility]. This appt can be telephone or office visit.  Please call [phone] ask for [facility]

## 2021-09-08 ENCOUNTER — Ambulatory Visit (INDEPENDENT_AMBULATORY_CARE_PROVIDER_SITE_OTHER): Payer: PPO | Admitting: Podiatry

## 2021-09-08 ENCOUNTER — Ambulatory Visit (INDEPENDENT_AMBULATORY_CARE_PROVIDER_SITE_OTHER): Payer: PPO

## 2021-09-08 DIAGNOSIS — M778 Other enthesopathies, not elsewhere classified: Secondary | ICD-10-CM

## 2021-09-08 NOTE — Progress Notes (Signed)
Chief Complaint  Patient presents with   Foot Pain    Left foot pain    HPI: 81 y.o. female presenting today for follow-up evaluation of moderate to severe left foot pain.  Patient states that she has done a significant amount of traveling this summer and she continues to have pain and tenderness to her left foot.  She was last seen in the office on 12/06/2020 and injection was administered.  Patient got no relief from the injection.  She continues to have pain despite conservative treatment including immobilization in cam boot this summer, oral anti-inflammatories, and steroid injections.  She has also reduced her activity significantly with no improvement.  She presents for further treatment and evaluation  Past Medical History:  Diagnosis Date   Hyperlipidemia    Hypertension    Hypothyroidism     Past Surgical History:  Procedure Laterality Date   ABDOMINAL HYSTERECTOMY     CARDIAC CATHETERIZATION     30 years ago, [place]   CHOLECYSTECTOMY N/A 08/21/2015   Procedure: LAPAROSCOPIC CHOLECYSTECTOMY;  Surgeon: Jules Husbands, MD;  Location: [facility];  Service: General;  Laterality: N/A;   EYE SURGERY     eye lid lift   KNEE ARTHROSCOPY Right 08/28/2018   Procedure: ARTHROSCOPY KNEE;  Surgeon: Lovell Sheehan, MD;  Location: [facility];  Service: Orthopedics;  Laterality: Right;   PARTIAL HYSTERECTOMY     REPLACEMENT TOTAL KNEE Right 2021   [facility]   right hip replacement Right 2017   [facility]   TONSILLECTOMY      Allergies  Allergen Reactions   Predicort [Prednisolone] Hives, Shortness Of Breath and Palpitations   Penicillins Hives and Swelling   Rosuvastatin     Muscle pain and weakness      Physical Exam: General: The patient is alert and oriented x3 in no acute distress.  Dermatology: Skin is warm, dry and supple bilateral lower extremities. Negative for open lesions or macerations.  Vascular: Palpable pedal pulses bilaterally. Capillary refill within  normal limits.  Negative for any significant edema or erythema  Neurological: Light touch and protective threshold grossly intact  Musculoskeletal Exam: No pedal deformities noted.  There is exquisite tenderness with palpation along the third metatarsal of the left foot.  Radiographic Exam:  Normal osseous mineralization. Joint spaces preserved. No fracture/dislocation/boney destruction.    Assessment: 1.  Suspicion for possible stress reaction fracture LT foot   Plan of Care:  1. Patient evaluated. X-Rays reviewed which are negative for any obvious fracture or irregularity.  2.  Patient continues to have pain and tenderness.  She is requesting an MRI and I do believe this is medically appropriate at this time to better visualize the foot and identify the source of pain.  She has failed multiple conservative treatment options and has not had any lasting relief of her foot pain. 3.  MRI ordered LT foot w/o contrast at [facility] 4. Will contact patient w/ MRI results to discuss      Edrick Kins, DPM [facility]  Dr. Edrick Kins, DPM    [address] [place] [address]                Office [phone]  Fax [phone]

## 2021-09-21 ENCOUNTER — Other Ambulatory Visit: Payer: Self-pay | Admitting: Internal Medicine

## 2021-09-25 ENCOUNTER — Ambulatory Visit
Admission: RE | Admit: 2021-09-25 | Discharge: 2021-09-25 | Disposition: A | Discharge status: Home / Self Care | Payer: BC Managed Care – PPO | Source: Ambulatory Visit | Attending: Podiatry | Admitting: Podiatry

## 2021-09-25 DIAGNOSIS — M778 Other enthesopathies, not elsewhere classified: Secondary | ICD-10-CM

## 2021-09-27 ENCOUNTER — Ambulatory Visit (INDEPENDENT_AMBULATORY_CARE_PROVIDER_SITE_OTHER): Payer: PPO | Admitting: Internal Medicine

## 2021-09-27 ENCOUNTER — Encounter: Payer: Self-pay | Admitting: Internal Medicine

## 2021-09-27 VITALS — BP 136/82 | HR 63 | Temp 97.6°F | Ht 62.0 in | Wt 177.4 lb

## 2021-09-27 DIAGNOSIS — M25551 Pain in right hip: Secondary | ICD-10-CM | POA: Diagnosis not present

## 2021-09-27 DIAGNOSIS — G720 Drug-induced myopathy: Secondary | ICD-10-CM

## 2021-09-27 DIAGNOSIS — I700 Atherosclerosis of aorta: Secondary | ICD-10-CM | POA: Diagnosis not present

## 2021-09-27 DIAGNOSIS — R5383 Other fatigue: Secondary | ICD-10-CM

## 2021-09-27 DIAGNOSIS — Z6837 Body mass index (BMI) 37.0-37.9, adult: Secondary | ICD-10-CM

## 2021-09-27 DIAGNOSIS — I2510 Atherosclerotic heart disease of native coronary artery without angina pectoris: Secondary | ICD-10-CM | POA: Diagnosis not present

## 2021-09-27 DIAGNOSIS — Z1159 Encounter for screening for other viral diseases: Secondary | ICD-10-CM | POA: Diagnosis not present

## 2021-09-27 DIAGNOSIS — I10 Essential (primary) hypertension: Secondary | ICD-10-CM

## 2021-09-27 DIAGNOSIS — E034 Atrophy of thyroid (acquired): Secondary | ICD-10-CM

## 2021-09-27 DIAGNOSIS — E7800 Pure hypercholesterolemia, unspecified: Secondary | ICD-10-CM | POA: Diagnosis not present

## 2021-09-27 DIAGNOSIS — G8929 Other chronic pain: Secondary | ICD-10-CM | POA: Diagnosis not present

## 2021-09-27 DIAGNOSIS — T466X5A Adverse effect of antihyperlipidemic and antiarteriosclerotic drugs, initial encounter: Secondary | ICD-10-CM

## 2021-09-27 DIAGNOSIS — Z1231 Encounter for screening mammogram for malignant neoplasm of breast: Secondary | ICD-10-CM

## 2021-09-27 LAB — COMPREHENSIVE METABOLIC PANEL
ALT: 10 U/L (ref 0–35)
AST: 18 U/L (ref 0–37)
Albumin: 4.1 g/dL (ref 3.5–5.2)
Alkaline Phosphatase: 59 U/L (ref 39–117)
BUN: 20 mg/dL (ref 6–23)
CO2: 29 mEq/L (ref 19–32)
Calcium: 9.5 mg/dL (ref 8.4–10.5)
Chloride: 104 mEq/L (ref 96–112)
Creatinine, Ser: 0.8 mg/dL (ref 0.40–1.20)
GFR: 69.97 mL/min (ref >60.00)
Glucose, Bld: 89 mg/dL (ref 70–99)
Potassium: 4.6 mEq/L (ref 3.5–5.1)
Sodium: 141 mEq/L (ref 135–145)
Total Bilirubin: 0.6 mg/dL (ref 0.2–1.2)
Total Protein: 6.7 g/dL (ref 6.0–8.3)

## 2021-09-27 LAB — MICROALBUMIN / CREATININE URINE RATIO
Creatinine,U: 161.1 mg/dL
Microalb Creat Ratio: 1.3 mg/g (ref 0.0–30.0)
Microalb, Ur: 2.1 mg/dL — ABNORMAL HIGH (ref 0.0–1.9)

## 2021-09-27 LAB — LIPID PANEL
Cholesterol: 238 mg/dL — ABNORMAL HIGH (ref 0–200)
HDL: 70.7 mg/dL (ref >39.00)
LDL Cholesterol: 134 mg/dL — ABNORMAL HIGH (ref 0–99)
NonHDL: 166.89
Total CHOL/HDL Ratio: 3
Triglycerides: 165 mg/dL — ABNORMAL HIGH (ref 0.0–149.0)
VLDL: 33 mg/dL (ref 0.0–40.0)

## 2021-09-27 LAB — SEDIMENTATION RATE: Sed Rate: 20 mm/hr (ref 0–30)

## 2021-09-27 LAB — CBC WITH DIFFERENTIAL/PLATELET
Basophils Absolute: 0.1 10*3/uL (ref 0.0–0.1)
Basophils Relative: 0.6 % (ref 0.0–3.0)
Eosinophils Absolute: 0.1 10*3/uL (ref 0.0–0.7)
Eosinophils Relative: 1.6 % (ref 0.0–5.0)
HCT: 45.8 % (ref 36.0–46.0)
Hemoglobin: 15.3 g/dL — ABNORMAL HIGH (ref 12.0–15.0)
Lymphocytes Relative: 19.1 % (ref 12.0–46.0)
Lymphs Abs: 1.6 10*3/uL (ref 0.7–4.0)
MCHC: 33.4 g/dL (ref 30.0–36.0)
MCV: 97.1 fl (ref 78.0–100.0)
Monocytes Absolute: 0.5 10*3/uL (ref 0.1–1.0)
Monocytes Relative: 6.3 % (ref 3.0–12.0)
Neutro Abs: 6 10*3/uL (ref 1.4–7.7)
Neutrophils Relative %: 72.4 % (ref 43.0–77.0)
Platelets: 251 10*3/uL (ref 150.0–400.0)
RBC: 4.71 Mil/uL (ref 3.87–5.11)
RDW: 12.4 % (ref 11.5–15.5)
WBC: 8.3 10*3/uL (ref 4.0–10.5)

## 2021-09-27 LAB — LDL CHOLESTEROL, DIRECT: Direct LDL: 136 mg/dL

## 2021-09-27 LAB — TSH: TSH: 4.21 u[IU]/mL (ref 0.35–5.50)

## 2021-09-27 LAB — C-REACTIVE PROTEIN: CRP: <1 mg/dL (ref 0.5–20.0)

## 2021-09-27 MED ORDER — TRAMADOL HCL 50 MG PO TABS: 50.0000 mg | ORAL_TABLET | Freq: Four times a day (QID) | ORAL | 0 refills | Status: AC | PRN

## 2021-09-27 MED ORDER — TIZANIDINE HCL 2 MG PO TABS: 2.0000 mg | ORAL_TABLET | Freq: Four times a day (QID) | ORAL | 1 refills | Status: DC | PRN

## 2021-09-27 NOTE — Patient Instructions (Addendum)
I am prescribing tramadol to add to your tylenol for pain management of your hip.  It can be taken every 6 hours,  or just at night,     Also giving you Tizanidine for muscle spasm.  Do not take within 2 hours of your glass of wine   You can take up to 2500 mg of acetominophen (tylenol) every day safely  In divided doses (max dose 1300 mg every 12  hours)

## 2021-09-27 NOTE — Progress Notes (Unsigned)
Subjective:  Patient ID: Meagan Cox, female    DOB: 11/25/1940  Age: 81 y.o. MRN: [id number]  CC: The primary encounter diagnosis was Essential hypertension. Diagnoses of Hypothyroidism due to acquired atrophy of thyroid, Pure hypercholesterolemia, Fatigue, unspecified type, and Encounter for hepatitis C screening test for low risk patient were also pertinent to this visit.   HPI Meagan Cox presents for  Chief Complaint  Patient presents with   Muscle Pain    1) right foot pain for the past year.  Had MRI yesterday by Dr Amalia Hailey podiatry  no fractures seen .  Tried acupuncture .   2)  s/p THR right hip in 2017.   Now right hip is "acting up"  has not slept for the last 2 nights.  Cramping in the buttock and pain laterally to touch,  as well as inguinal .  Hurting  for the past month  no recent invasive procedures or teeth cleaning,  no fevers.   Has been taking tylenol without control of pain .  Thinks it may be bursitis.   3)     Outpatient Medications Prior to Visit  Medication Sig Dispense Refill   ALPRAZolam (XANAX) 0.5 MG tablet Take 0.5 mg by mouth at bedtime as needed for anxiety.     estradiol (ESTRACE) 2 MG tablet TAKE 1 TABLET(2 MG) BY MOUTH DAILY 90 tablet 1   levothyroxine (SYNTHROID) 100 MCG tablet TAKE 1 TABLET(100 MCG) BY MOUTH DAILY 90 tablet 3   metoprolol succinate (TOPROL-XL) 25 MG 24 hr tablet TAKE 1 TABLET(25 MG) BY MOUTH DAILY 90 tablet 0   metroNIDAZOLE (METROCREAM) 0.75 % cream Apply topically daily.     Multiple Vitamin (MULTIVITAMIN) tablet Take 1 tablet by mouth daily.     rosuvastatin (CRESTOR) 10 MG tablet TAKE 1 TABLET(10 MG) BY MOUTH EVERY OTHER DAY 45 tablet 3   No facility-administered medications prior to visit.    Review of Systems;  Patient denies headache, fevers, malaise, unintentional weight loss, skin rash, eye pain, sinus congestion and sinus pain, sore throat, dysphagia,  hemoptysis , cough, dyspnea, wheezing, chest pain,  palpitations, orthopnea, edema, abdominal pain, nausea, melena, diarrhea, constipation, flank pain, dysuria, hematuria, urinary  Frequency, nocturia, numbness, tingling, seizures,  Focal weakness, Loss of consciousness,  Tremor, insomnia, depression, anxiety, and suicidal ideation.      Objective:  BP 136/82 (BP Location: Left Arm, Patient Position: Sitting, Cuff Size: Normal)   Pulse 63   Temp 97.6 F (36.4 C) (Oral)   Ht '5\' 2"'  (1.575 m)   Wt 177 lb 6.4 oz (80.5 kg)   SpO2 99%   BMI 32.45 kg/m   BP Readings from Last 3 Encounters:  09/27/21 136/82  11/03/20 132/76  05/04/20 120/74    Wt Readings from Last 3 Encounters:  09/27/21 177 lb 6.4 oz (80.5 kg)  11/03/20 181 lb 12.8 oz (82.5 kg)  05/04/20 189 lb 12.8 oz (86.1 kg)    General appearance: alert, cooperative and appears stated age Ears: normal TM's and external ear canals both ears Throat: lips, mucosa, and tongue normal; teeth and gums normal Neck: no adenopathy, no carotid bruit, supple, symmetrical, trachea midline and thyroid not enlarged, symmetric, no tenderness/mass/nodules Back: symmetric, no curvature. ROM normal. No CVA tenderness. Lungs: clear to auscultation bilaterally Heart: regular rate and rhythm, S1, S2 normal, no murmur, click, rub or gallop Abdomen: soft, non-tender; bowel sounds normal; no masses,  no organomegaly Pulses: 2+ and symmetric Skin: Skin color, texture, turgor  normal. No rashes or lesions Lymph nodes: Cervical, supraclavicular, and axillary nodes normal.  Lab Results  Component Value Date   HGBA1C 5.0 05/04/2020   HGBA1C 5.1 04/02/2019    Lab Results  Component Value Date   CREATININE 0.75 12/26/2020   CREATININE 0.71 11/03/2020   CREATININE 0.75 05/04/2020    Lab Results  Component Value Date   WBC 7.9 05/04/2020   HGB 14.9 05/04/2020   HCT 44.5 05/04/2020   PLT 262.0 05/04/2020   GLUCOSE 84 12/26/2020   CHOL 242 (H) 11/03/2020   TRIG 220.0 (H) 11/03/2020   HDL 55.20  11/03/2020   LDLDIRECT 155.0 11/03/2020   LDLCALC 135 (H) 05/04/2020   ALT 9 12/26/2020   AST 14 12/26/2020   NA 140 12/26/2020   K 4.5 12/26/2020   CL 101 12/26/2020   CREATININE 0.75 12/26/2020   BUN 19 12/26/2020   CO2 30 12/26/2020   TSH 2.90 05/04/2020   HGBA1C 5.0 05/04/2020    MR FOOT LEFT WO CONTRAST  Result Date: 09/26/2021 CLINICAL DATA:  Foot pain, chronic, metatarsalgia. Pain in the distal 1st, 2nd and 3rd metatarsals for 1 year. No known injury or prior relevant surgery. EXAM: MRI OF THE LEFT FOOT WITHOUT CONTRAST TECHNIQUE: Multiplanar, multisequence MR imaging of the left forefoot was performed. No intravenous contrast was administered. COMPARISON:  Radiographs 09/08/2021 and 06/10/2020 FINDINGS: Bones/Joint/Cartilage No evidence of acute fracture, dislocation or bone destruction. There are mild degenerative changes at the 1st metatarsophalangeal and the Lisfranc joints. No significant joint effusions or erosive changes. Probable small ganglion projecting dorsally from the Lisfranc joint (3rd tarsometatarsal articulation). Ligaments Intact Lisfranc ligament. Intact collateral ligaments of the metatarsophalangeal joints. Muscles and Tendons The flexor and extensor tendons appear intact, without significant tenosynovitis. No focal muscular abnormalities are identified. Soft tissues A small amount of fluid within the intermetatarsal bursa is within physiologic limits. Mild nonspecific edema within the distal plantar subcutaneous fat. No focal fluid collection or typical Morton's neuroma identified. IMPRESSION: 1. No clear cause for the patient's symptoms identified. No evidence of metatarsal stress fracture or significant metatarsal-phalangeal arthropathy. 2. Midfoot degenerative changes with small ganglion projecting dorsally from the 3rd tarsometatarsal joint. 3. Nonspecific mild edema within the plantar subcutaneous fat of the distal forefoot. 4. No significant musculotendinous  findings. Electronically Signed   By: Richardean Sale M.D.   On: 09/26/2021 16:12    Assessment & Plan:   Problem List Items Addressed This Visit     Essential hypertension - Primary   Relevant Orders   Comp Met (CMET)   Urine Microalbumin w/creat. ratio   Hyperlipidemia   Relevant Orders   Lipid Profile   Direct LDL   Hypothyroidism   Relevant Orders   TSH   Other Visit Diagnoses     Fatigue, unspecified type       Relevant Orders   CBC with Differential/Platelet   Encounter for hepatitis C screening test for low risk patient       Relevant Orders   Hepatitis C antibody       I spent a total of   minutes with this patient in a face to face visit on the date of this encounter reviewing the last office visit with me on        ,  most recent with patient's cardiologist in    ,  patient'ss diet and eating habits, home blood pressure readings ,  most recent imaging study ,   and post visit ordering of  testing and therapeutics.    Follow-up: No follow-ups on file.   Crecencio Mc, MD

## 2021-09-28 LAB — HEPATITIS C ANTIBODY: Hepatitis C Ab: NONREACTIVE

## 2021-09-28 NOTE — Assessment & Plan Note (Signed)
Reported by patient as non critical by a remote cardiac catheterization nearly ten years ago.  With no interim evaluation per patient preference (one office visit in 2012 by Dr Rockey Situ) she is asymptomatic .   Continue beta blocker , twice weekly statin therapy  Lab Results  Component Value Date   CHOL 238 (H) 09/27/2021   HDL 70.70 09/27/2021   LDLCALC 134 (H) 09/27/2021   LDLDIRECT 136.0 09/27/2021   TRIG 165.0 (H) 09/27/2021   CHOLHDL 3 09/27/2021

## 2021-09-28 NOTE — Assessment & Plan Note (Signed)
Well controlled on current regimen. Renal function stable, no changes today. Continue  Toprol  XL given history of CAD .

## 2021-09-28 NOTE — Assessment & Plan Note (Signed)
30 lb wt loss since Feb 2019.  following a low GI diet , BMI reduced from 37 to 32

## 2021-09-28 NOTE — Assessment & Plan Note (Addendum)
She has no symptoms of infected prosthetic joint and no risk factors.  CRP is normal. Refer to [facility] for evauation  and treatment of presumed bursitis.  Trial of tramadol for pain m anagement as she is alray taking tylenol   Lab Results  Component Value Date   CRP <1.0 09/27/2021

## 2021-09-28 NOTE — Assessment & Plan Note (Signed)
Aortic atherosclerosis : Reviewed her need to continue  statin therapy given documented evidence of moderate  atherosclerosis in the aorta noted on previous  Imaging study,.  She is tolerating use of  rosuvastatin 2 times per week    Lab Results  Component Value Date   CHOL 238 (H) 09/27/2021   HDL 70.70 09/27/2021   LDLCALC 134 (H) 09/27/2021   LDLDIRECT 136.0 09/27/2021   TRIG 165.0 (H) 09/27/2021   CHOLHDL 3 09/27/2021

## 2021-09-28 NOTE — Assessment & Plan Note (Signed)
She is tolerating crestor use limited to 2/week.  Previously developed diffuse pain with daily use

## 2021-09-29 ENCOUNTER — Ambulatory Visit: Payer: Self-pay

## 2021-09-29 NOTE — Patient Outreach (Signed)
  Care Coordination   Initial Visit Note   09/29/2021 Name: Meagan Cox MRN: [id number] DOB: 1940/04/09  Meagan Cox is a 81 y.o. year old female who sees Crecencio Mc, MD for primary care. I spoke with  Meagan Cox by phone today.  What matters to the patients health and wellness today?  Patient states she does not do annual wellness visits because she feels they are a rip off.  Patient states she does not have any nursing or community resource needs at this time.     Goals Addressed             This Visit's Progress    Care Coordination activities - No follow up required       Care Coordination Interventions: Care coordination services discussed Social determinants of health survey completed Annual wellness visit discussed Patient advised to follow up with provider office for referral if care coordination services needed in the future          SDOH assessments and interventions completed:  Yes  SDOH Interventions Today    Flowsheet Row Most Recent Value  SDOH Interventions   Food Insecurity Interventions Intervention Not Indicated  Housing Interventions Intervention Not Indicated  Transportation Interventions Intervention Not Indicated        Care Coordination Interventions Activated:  No  Care Coordination Interventions:  No, not indicated   Follow up plan: No further intervention required.   Encounter Outcome:  Pt. Visit Completed   Quinn Plowman RN,BSN,CCM [place] Coordinator [phone]

## 2021-10-12 ENCOUNTER — Telehealth: Payer: Self-pay | Admitting: Internal Medicine

## 2021-10-12 NOTE — Telephone Encounter (Signed)
Copied from [place] [id number]. Topic: Medicare AWV >> Oct 12, 2021 11:40 AM [name] wrote: Reason for CRM: Left message for patient to schedule Annual Wellness Visit.  Please schedule with Nurse Health Advisor Denisa O'Brien-Blaney, LPN at [facility]. This appt can be telephone or office visit.  Please call [phone] ask for [facility]

## 2021-10-13 ENCOUNTER — Ambulatory Visit (INDEPENDENT_AMBULATORY_CARE_PROVIDER_SITE_OTHER): Payer: PPO | Admitting: Podiatry

## 2021-10-13 DIAGNOSIS — M778 Other enthesopathies, not elsewhere classified: Secondary | ICD-10-CM

## 2021-10-13 DIAGNOSIS — Z96641 Presence of right artificial hip joint: Secondary | ICD-10-CM | POA: Diagnosis not present

## 2021-10-13 DIAGNOSIS — M7061 Trochanteric bursitis, right hip: Secondary | ICD-10-CM | POA: Diagnosis not present

## 2021-10-15 NOTE — Progress Notes (Signed)
Chief Complaint  Patient presents with   mri results    Patient is here to discuss mri results with the provider, she states that not much has changed with her left foot.    HPI: 81 y.o. female presenting today for follow-up evaluation of left foot pain.  Last visit MRI was ordered.  She presents today to review the MRI results and discuss further treatment options  Past Medical History:  Diagnosis Date   Hyperlipidemia    Hypertension    Hypothyroidism     Past Surgical History:  Procedure Laterality Date   ABDOMINAL HYSTERECTOMY     CARDIAC CATHETERIZATION     30 years ago,    CHOLECYSTECTOMY N/A 08/21/2015   Procedure: LAPAROSCOPIC CHOLECYSTECTOMY;  Surgeon: Jules Husbands, MD;  Location: [facility];  Service: General;  Laterality: N/A;   EYE SURGERY     eye lid lift   KNEE ARTHROSCOPY Right 08/28/2018   Procedure: ARTHROSCOPY KNEE;  Surgeon: Lovell Sheehan, MD;  Location: [place];  Service: Orthopedics;  Laterality: Right;   PARTIAL HYSTERECTOMY     REPLACEMENT TOTAL KNEE Right 2021   [facility]   right hip replacement Right 2017   [facility]   TONSILLECTOMY      Allergies  Allergen Reactions   Predicort [Prednisolone] Hives, Shortness Of Breath and Palpitations   Penicillins Hives and Swelling   Rosuvastatin     Muscle pain and weakness      Physical Exam: General: The patient is alert and oriented x3 in no acute distress.  Dermatology: Skin is warm, dry and supple bilateral lower extremities. Negative for open lesions or macerations.  Vascular: Palpable pedal pulses bilaterally. Capillary refill within normal limits.  Negative for any significant edema or erythema  Neurological: Light touch and protective threshold grossly intact  Musculoskeletal Exam: No pedal deformities noted.  There continues to be tenderness with palpation along the third metatarsal of the left foot.  MR FOOT LEFT WO CONTRAST 09/25/2021 IMPRESSION: 1. No clear cause for  the patient's symptoms identified. No evidence of metatarsal stress fracture or significant metatarsal-phalangeal arthropathy. 2. Midfoot degenerative changes with small ganglion projecting dorsally from the 3rd tarsometatarsal joint. 3. Nonspecific mild edema within the plantar subcutaneous fat of the distal forefoot. 4. No significant musculotendinous findings.  Assessment: 1.  Suspicion for possible stress reaction fracture/capsulitis LT foot around the mid metatarsals   Plan of Care:  1. Patient evaluated.  MRI was reviewed today which did not identify the patient's specific cause of pain along the dorsal aspect of the metatarsals.  2.  Continue good supportive shoes and sneakers.  Advised against going barefoot 3.  Recommend rest ice and elevation.  Also recommend oral anti-inflammatories as needed 4.  Unfortunately I do not know the patient's specific issue creating her symptoms.  We will simply observe for now to see if this resolves on its own  5.  Return to clinic as needed     Edrick Kins, DPM [facility]  Dr. Edrick Kins, DPM    [address], [place] [address]                Office [phone]  Fax [phone]

## 2021-10-22 ENCOUNTER — Other Ambulatory Visit: Payer: Self-pay | Admitting: Family

## 2021-10-25 ENCOUNTER — Ambulatory Visit
Admission: RE | Admit: 2021-10-25 | Discharge: 2021-10-25 | Disposition: A | Discharge status: Home / Self Care | Payer: PPO | Source: Ambulatory Visit | Attending: Internal Medicine | Admitting: Internal Medicine

## 2021-10-25 DIAGNOSIS — Z1231 Encounter for screening mammogram for malignant neoplasm of breast: Secondary | ICD-10-CM | POA: Insufficient documentation

## 2021-10-29 ENCOUNTER — Other Ambulatory Visit: Payer: Self-pay | Admitting: Internal Medicine

## 2021-10-30 MED ORDER — METOPROLOL SUCCINATE ER 25 MG PO TB24: 25.0000 mg | ORAL_TABLET | Freq: Every day | ORAL | 0 refills | Status: DC

## 2021-11-07 ENCOUNTER — Other Ambulatory Visit: Payer: Self-pay | Admitting: Family

## 2021-11-09 ENCOUNTER — Encounter: Payer: Self-pay | Admitting: Internal Medicine

## 2021-11-09 NOTE — Telephone Encounter (Signed)
Spoke with pharmacy and they stated that they had not received rx. A verbal was given and pt is aware.

## 2021-11-22 ENCOUNTER — Telehealth: Payer: Self-pay | Admitting: Internal Medicine

## 2021-11-22 NOTE — Telephone Encounter (Signed)
Copied from [place] [id number]. Topic: Medicare AWV >> Nov 22, 2021  2:03 PM [name] wrote: Reason for CRM: Left message for patient to schedule Annual Wellness Visit.  Please schedule with Nurse Health Advisor Denisa O'Brien-Blaney, LPN at [facility]. This appt can be telephone or office visit.  Please call [phone] ask for [facility]

## 2021-12-11 DIAGNOSIS — H524 Presbyopia: Secondary | ICD-10-CM | POA: Diagnosis not present

## 2021-12-11 DIAGNOSIS — H04129 Dry eye syndrome of unspecified lacrimal gland: Secondary | ICD-10-CM | POA: Diagnosis not present

## 2021-12-11 DIAGNOSIS — H2513 Age-related nuclear cataract, bilateral: Secondary | ICD-10-CM | POA: Diagnosis not present

## 2021-12-11 DIAGNOSIS — H1045 Other chronic allergic conjunctivitis: Secondary | ICD-10-CM | POA: Diagnosis not present

## 2021-12-28 ENCOUNTER — Encounter: Payer: Self-pay | Admitting: Internal Medicine

## 2021-12-28 ENCOUNTER — Ambulatory Visit (INDEPENDENT_AMBULATORY_CARE_PROVIDER_SITE_OTHER): Payer: PPO | Admitting: Internal Medicine

## 2021-12-28 VITALS — BP 130/70 | HR 60 | Temp 98.2°F | Ht 62.0 in | Wt 175.2 lb

## 2021-12-28 DIAGNOSIS — Z23 Encounter for immunization: Secondary | ICD-10-CM | POA: Diagnosis not present

## 2021-12-28 DIAGNOSIS — I700 Atherosclerosis of aorta: Secondary | ICD-10-CM | POA: Diagnosis not present

## 2021-12-28 DIAGNOSIS — Z0000 Encounter for general adult medical examination without abnormal findings: Secondary | ICD-10-CM | POA: Diagnosis not present

## 2021-12-28 DIAGNOSIS — Z780 Asymptomatic menopausal state: Secondary | ICD-10-CM

## 2021-12-28 DIAGNOSIS — E034 Atrophy of thyroid (acquired): Secondary | ICD-10-CM

## 2021-12-28 MED ORDER — METOPROLOL SUCCINATE ER 25 MG PO TB24: 25.0000 mg | ORAL_TABLET | Freq: Every day | ORAL | 1 refills | Status: DC

## 2021-12-28 NOTE — Assessment & Plan Note (Signed)
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

## 2021-12-28 NOTE — Assessment & Plan Note (Signed)
She declines DEXA scan/

## 2021-12-28 NOTE — Progress Notes (Signed)
Patient ID: Meagan Cox, female    DOB: Jun 14, 1940  Age: 81 y.o. MRN: [id number]  The patient is here for annual preventive examination and management of other chronic and acute problems.   The risk factors are reflected in the social history.  The roster of all physicians providing medical care to patient - is listed in the Snapshot section of the chart.  Activities of daily living:  The patient is 100% independent in all ADLs: dressing, toileting, feeding as well as independent mobility  Home safety : The patient has smoke detectors in the home. They wear seatbelts.  There are no firearms at home. There is no violence in the home.   There is no risks for hepatitis, STDs or HIV. There is no   history of blood transfusion. They have no travel history to infectious disease endemic areas of the world.  The patient has seen their dentist in the last six month. They have seen their eye doctor in the last year. They admit to slight hearing difficulty with regard to whispered voices and some television programs.  They have deferred audiologic testing in the last year.  They do not  have excessive sun exposure. Discussed the need for sun protection: hats, long sleeves and use of sunscreen if there is significant sun exposure.   Diet: the importance of a healthy diet is discussed. They do have a healthy diet.  The benefits of regular aerobic exercise were discussed. She walks 4 times per week ,  20 minutes.   Depression screen: there are no signs or vegative symptoms of depression- irritability, change in appetite, anhedonia, sadness/tearfullness.  Cognitive assessment: the patient manages all their financial and personal affairs and is actively engaged. They could relate day,date,year and events; recalled 2/3 objects at 3 minutes; performed clock-face test normally.  The following portions of the patient's history were reviewed and updated as appropriate: allergies, current medications, past  family history, past medical history,  past surgical history, past social history  and problem list.  Visual acuity was not assessed per patient preference since she has regular follow up with her ophthalmologist. Hearing and body mass index were assessed and reviewed.   During the course of the visit the patient was educated and counseled about appropriate screening and preventive services including : fall prevention , diabetes screening, nutrition counseling, colorectal cancer screening, and recommended immunizations.    CC: The primary encounter diagnosis was Asymptomatic postmenopausal estrogen deficiency. Diagnoses of Aortic atherosclerosis ([place]), Need for immunization against influenza, and Hypothyroidism due to acquired atrophy of thyroid were also pertinent to this visit.   1) getting acupuncture on left lateral thigh for persistent pain and tightness. [facility].   Improving  significant.  Doing stretching exercises dutifully    2) foot pain since [place] trip:  multiple podiatry MRI evaluations.    3) statin myalgia: repeat trial not tolerated  4) some bowel irregularity despite taking flax seed fish oil and increasing her intake of water.  Last TSH was 4 on 100 mcg levothyroxine, and having some dysphagia "intermittent spasms of esophagus"    History Elaria has a past medical history of Hyperlipidemia, Hypertension, and Hypothyroidism.   She has a past surgical history that includes Cardiac catheterization; Tonsillectomy; Partial hysterectomy; Abdominal hysterectomy; Cholecystectomy (N/A, 08/21/2015); Eye surgery; Knee arthroscopy (Right, 08/28/2018); right hip replacement (Right, 2017); and Replacement total knee (Right, 2021).   Her family history includes Cancer in her father; Heart disease in her mother; Hypothyroidism in her  mother.She reports that she quit smoking about 33 years ago. Her smoking use included cigarettes. She has never used smokeless tobacco. She reports  current alcohol use of about 1.0 standard drink of alcohol per week. She reports that she does not use drugs.  Outpatient Medications Prior to Visit  Medication Sig Dispense Refill   estradiol (ESTRACE) 2 MG tablet TAKE 1 TABLET(2 MG) BY MOUTH DAILY 90 tablet 1   levothyroxine (SYNTHROID) 100 MCG tablet TAKE 1 TABLET(100 MCG) BY MOUTH DAILY 90 tablet 3   Multiple Vitamin (MULTIVITAMIN) tablet Take 1 tablet by mouth daily.     tiZANidine (ZANAFLEX) 2 MG tablet Take 1 tablet (2 mg total) by mouth every 6 (six) hours as needed for muscle spasms. 60 tablet 1   ALPRAZolam (XANAX) 0.5 MG tablet Take 0.5 mg by mouth at bedtime as needed for anxiety.     metoprolol succinate (TOPROL-XL) 25 MG 24 hr tablet Take 1 tablet (25 mg total) by mouth daily. 90 tablet 0   metroNIDAZOLE (METROCREAM) 0.75 % cream Apply topically daily.     rosuvastatin (CRESTOR) 10 MG tablet TAKE 1 TABLET(10 MG) BY MOUTH EVERY OTHER DAY 45 tablet 3   No facility-administered medications prior to visit.    Review of Systems  Patient denies headache, fevers, malaise, unintentional weight loss, skin rash, eye pain, sinus congestion and sinus pain, sore throat, dysphagia,  hemoptysis , cough, dyspnea, wheezing, chest pain, palpitations, orthopnea, edema, abdominal pain, nausea, melena, diarrhea, constipation, flank pain, dysuria, hematuria, urinary  Frequency, nocturia, numbness, tingling, seizures,  Focal weakness, Loss of consciousness,  Tremor, insomnia, depression, anxiety, and suicidal ideation.     Objective:  BP 130/70   Pulse 60   Temp 98.2 F (36.8 C) (Oral)   Ht '5\' 2"'$  (1.575 m)   Wt 175 lb 3.2 oz (79.5 kg)   SpO2 99%   BMI 32.04 kg/m   Physical Exam  General appearance: alert, cooperative and appears stated age Ears: normal TM's and external ear canals both ears Throat: lips, mucosa, and tongue normal; teeth and gums normal Neck: no adenopathy, no carotid bruit, supple, symmetrical, trachea midline and thyroid  not enlarged, symmetric, no tenderness/mass/nodules Back: symmetric, no curvature. ROM normal. No CVA tenderness. Lungs: clear to auscultation bilaterally Heart: regular rate and rhythm, S1, S2 normal, no murmur, click, rub or gallop Abdomen: soft, non-tender; bowel sounds normal; no masses,  no organomegaly Pulses: 2+ and symmetric Skin: Skin color, texture, turgor normal. No rashes or lesions Lymph nodes: Cervical, supraclavicular, and axillary nodes normal.  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  No current changes needed.    Lab Results  Component Value Date   TSH 4.21 09/27/2021        Relevant Medications   metoprolol succinate (TOPROL-XL) 25 MG 24 hr tablet   Other Visit Diagnoses     Need for immunization against influenza       Relevant Orders   Flu Vaccine QUAD High Dose(Fluad) (Completed)       Follow-up: Return in about 3 months (around 03/29/2022).   Crecencio Mc,  MD

## 2021-12-28 NOTE — Patient Instructions (Addendum)
The bill from [place] was for Hepatitis C screening that is recommended for all baby boomers at least once during lifetime  The bill states that it was not paid because they billed the wrong insurance company,  so call them and give them your insurance information   For the bowels:  Consider a trial of benefiber .  It provides extra fiber and a "prebiotic"  Repeat labs in late February   Take thyroid medication on an empty stomach with water and wait 30 minutes before eating or taking anything else    I strongly recommend that you get the TDaP vaccine  again if it has been over ten years for protection against tetanus,  And for lifetime protection against  diptheria and whooping cough  100% covered by medicare if you get it at pharmacy

## 2021-12-30 DIAGNOSIS — Z0000 Encounter for general adult medical examination without abnormal findings: Secondary | ICD-10-CM | POA: Insufficient documentation

## 2021-12-30 NOTE — Assessment & Plan Note (Signed)
Thyroid function is WNL on current dose.  No current changes needed.    Lab Results  Component Value Date   TSH 4.21 09/27/2021

## 2021-12-30 NOTE — Assessment & Plan Note (Addendum)

## 2022-03-08 ENCOUNTER — Other Ambulatory Visit: Payer: Self-pay | Admitting: Internal Medicine

## 2022-03-20 ENCOUNTER — Other Ambulatory Visit: Payer: Self-pay | Admitting: Family

## 2022-03-23 ENCOUNTER — Telehealth: Payer: Self-pay | Admitting: Internal Medicine

## 2022-03-23 NOTE — Telephone Encounter (Signed)
Called patient to schedule Medicare Annual Wellness Visit (AWV). Left message for patient to call back and schedule Medicare Annual Wellness Visit (AWV).  Last date of AWV: 06/19/2017   Please schedule an appointment at any time with Denisa,NHA.  If any questions, please contact me at [phone].    Thank you,  [place] Direct dial  [phone]

## 2022-05-08 ENCOUNTER — Other Ambulatory Visit: Payer: Self-pay | Admitting: Family

## 2022-05-12 ENCOUNTER — Encounter: Payer: Self-pay | Admitting: Internal Medicine

## 2022-05-14 MED ORDER — ESTRADIOL 2 MG PO TABS: ORAL_TABLET | ORAL | 1 refills | Status: DC

## 2022-07-05 ENCOUNTER — Telehealth: Payer: Self-pay | Admitting: Internal Medicine

## 2022-07-05 NOTE — Telephone Encounter (Signed)
LVMTCB to make follow-up appointment with provider

## 2022-07-09 DIAGNOSIS — Z0189 Encounter for other specified special examinations: Secondary | ICD-10-CM | POA: Diagnosis not present

## 2022-07-09 DIAGNOSIS — M1612 Unilateral primary osteoarthritis, left hip: Secondary | ICD-10-CM | POA: Diagnosis not present

## 2022-07-12 DIAGNOSIS — M1612 Unilateral primary osteoarthritis, left hip: Secondary | ICD-10-CM | POA: Diagnosis not present

## 2022-07-19 ENCOUNTER — Encounter: Payer: Self-pay | Admitting: Physical Therapy

## 2022-07-19 ENCOUNTER — Ambulatory Visit: Payer: PPO | Attending: Orthopedic Surgery

## 2022-07-19 DIAGNOSIS — Z96642 Presence of left artificial hip joint: Secondary | ICD-10-CM | POA: Insufficient documentation

## 2022-07-19 DIAGNOSIS — M2560 Stiffness of unspecified joint, not elsewhere classified: Secondary | ICD-10-CM | POA: Insufficient documentation

## 2022-07-19 DIAGNOSIS — M6281 Muscle weakness (generalized): Secondary | ICD-10-CM | POA: Insufficient documentation

## 2022-07-19 DIAGNOSIS — R2689 Other abnormalities of gait and mobility: Secondary | ICD-10-CM | POA: Insufficient documentation

## 2022-07-19 NOTE — Therapy (Signed)
OUTPATIENT PHYSICAL THERAPY LOWER EXTREMITY EVALUATION   Patient Name: Meagan Cox MRN: [id number] DOB:11-15-1940, 82 y.o., female Today's Date: 07/19/2022  END OF SESSION:  PT End of Session - 07/19/22 1425     Visit Number 1    Number of Visits 17    Date for PT Re-Evaluation 09/13/22    Authorization Type [employer] P[employer]    Progress Note Due on Visit 10    PT Start Time 1430    PT Stop Time 1515    PT Time Calculation (min) 45 min    Activity Tolerance Patient tolerated treatment well    Behavior During Therapy WFL for tasks assessed/performed             Past Medical History:  Diagnosis Date   Hyperlipidemia    Hypertension    Hypothyroidism    Past Surgical History:  Procedure Laterality Date   ABDOMINAL HYSTERECTOMY     CARDIAC CATHETERIZATION     30 years ago, [place]   CHOLECYSTECTOMY N/A 08/21/2015   Procedure: LAPAROSCOPIC CHOLECYSTECTOMY;  Surgeon: Leafy Ro, MD;  Location: [facility];  Service: General;  Laterality: N/A;   EYE SURGERY     eye lid lift   KNEE ARTHROSCOPY Right 08/28/2018   Procedure: ARTHROSCOPY KNEE;  Surgeon: Lyndle Herrlich, MD;  Location: [facility];  Service: Orthopedics;  Laterality: Right;   PARTIAL HYSTERECTOMY     REPLACEMENT TOTAL KNEE Right 2021   [facility]   right hip replacement Right 2017   [facility]   TONSILLECTOMY     Patient Active Problem List   Diagnosis Date Noted   Encounter for preventive care 12/30/2021   Asymptomatic postmenopausal estrogen deficiency 12/28/2021   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   Aortic atherosclerosis (H[facility]) 11/05/2020   Postsurgical menopause 06/09/2020   Breast cancer screening 05/04/2020   Travel advice encounter 10/19/2017   Loss of perception for taste 10/19/2017   Vertigo 10/19/2017   Essential hypertension 03/05/2017   S/P laparoscopic cholecystectomy 03/05/2017   Ptosis, both eyelids 01/04/2017   Complete tear, knee, anterior cruciate ligament 08/28/2016    History of total hip arthroplasty 08/28/2016   Tear of meniscus of knee 08/28/2016   Medicare annual wellness visit, subsequent 07/24/2015   Cerebrovascular small vessel disease 04/24/2015   Benign paroxysmal positional vertigo 04/24/2015   Right medial knee pain 04/24/2015   Shoulder pain, right 11/22/2013   Edema 05/14/2012   Polyarthritis of ankle 04/08/2011   Hypothyroidism 12/18/2010   Low back pain radiating to both legs 12/18/2010   Chronic right hip pain 12/18/2010   Obesity 05/09/2010   CAD (coronary artery disease) 05/09/2010   Hyperlipidemia 05/09/2010    PCP: Sherlene Shams, MD  REFERRING PROVIDER: Altamese Cabal, PA-C  REFERRING DIAG: [phone] (ICD-10-CM) - S/P total left hip arthroplasty  THERAPY DIAG:  Status post total replacement of left hip  Muscle weakness (generalized)  Decreased range of motion  Other abnormalities of gait and mobility  Rationale for Evaluation and Treatment: Rehabilitation  ONSET DATE: 07/12/22  SUBJECTIVE:   SUBJECTIVE STATEMENT: Patient reports having L THA on 07/12/22 with [facility]. She was provided an HEP program that she has attempted to complete to the best of her ability. Was given pain medications and muscle relaxer. Initially had no pain but has been experiencing cramps in the posterior aspect of her leg.   PERTINENT HISTORY: S/p L THA on 07/12/22 PAIN:  Are you having pain? No - reports cramping in the posterior aspect  of her leg   PRECAUTIONS: Fall  WEIGHT BEARING RESTRICTIONS: Yes WBAT  FALLS:  Has patient fallen in last 6 months? No  LIVING ENVIRONMENT: Lives with: lives with their daughter Lives in: House/apartment Stairs: Yes: Internal: 2 steps; on right going up Has following equipment at home: Single point cane, Karry Barrilleaux - 2 wheeled, and bed side commode  OCCUPATION: retired   PLOF: Independent  PATIENT GOALS: getting out of therapy  NEXT MD VISIT: 07/24/22  OBJECTIVE:   DIAGNOSTIC FINDINGS: N/A    PATIENT SURVEYS:  FOTO 7 with goal of 107  COGNITION: Overall cognitive status: Within functional limits for tasks assessed     SENSATION: WFL  POSTURE: rounded shoulders and forward head   LOWER EXTREMITY ROM:  Active ROM Right eval Left eval  Hip flexion  45 deg  Hip extension  8 deg   (Blank rows = not tested)  LOWER EXTREMITY MMT:  MMT Right eval Left eval  Hip flexion 4- 2+  Hip extension    Hip abduction    Hip adduction    Hip internal rotation    Hip external rotation    Knee flexion 4 4-*  Knee extension 4 4-*  Ankle dorsiflexion    Ankle plantarflexion    Ankle inversion    Ankle eversion     (Blank rows = not tested)    GAIT: Distance walked: 50' Assistive device utilized: Environmental consultant - 2 wheeled Level of assistance: SBA Comments: decreased stance time on L, decreased L knee flexion 2/2 leg length discrepancy, antalgic  STAIRS:  # of stairs: 4  Assistance: B rails, SBA   Comments: step to pattern leading with R LE   TODAY'S TREATMENT:                                                                                                                              DATE: 07/19/22   HEP review from surgery center, provided patient with seated HEP   PATIENT EDUCATION:  Education details: HEP, POC, goals  Person educated: Patient Education method: Explanation, Demonstration, and Handouts Education comprehension: verbalized understanding  HOME EXERCISE PROGRAM: Access Code: [email or web address] URL: [email or web address] Date: 07/19/2022 Prepared by: Maylon Peppers  Exercises - Seated Long Arc Quad  - 2-3 x daily - 5-7 x weekly - 3 sets - 10 reps - Seated March  - 2-3 x daily - 5-7 x weekly - 3 sets - 10 reps - Seated Heel Raise  - 2-3 x daily - 5-7 x weekly - 3 sets - 10 reps  ASSESSMENT:  CLINICAL IMPRESSION: Patient is a 82 y.o. female who was seen today for physical therapy evaluation and treatment s/p L THA on 07/12/22. Patient  demonstrates impaired L hip ROM, LLE weakness, impaired balance, and decreased activity tolerance. Currently, ambulating with RW with antalgic gait pattern on L with decreased L knee and hip flexion. Patient reporting leg length discrepancy at baseline (per patient, 1.5" difference R>L). Patient  will benefit from skilled PT services to address listed impairments to improve overall mobility and return to PLOF.   OBJECTIVE IMPAIRMENTS: Abnormal gait, decreased activity tolerance, decreased balance, decreased endurance, decreased knowledge of use of DME, decreased mobility, difficulty walking, decreased ROM, decreased strength, increased muscle spasms, impaired flexibility, and postural dysfunction.   ACTIVITY LIMITATIONS: bending, standing, squatting, stairs, transfers, bed mobility, and locomotion level  PARTICIPATION LIMITATIONS: cleaning, laundry, and community activity  PERSONAL FACTORS: Age and Fitness are also affecting patient's functional outcome.   REHAB POTENTIAL: Good  CLINICAL DECISION MAKING: Stable/uncomplicated  EVALUATION COMPLEXITY: Low   GOALS: Goals reviewed with patient? Yes  SHORT TERM GOALS: Target date: 08/16/2022  Patient will be independent in HEP to improve strength/mobility for better functional independence with ADLs. Baseline: 6/20: HEP provided  Goal status: INITIAL  2.  Patient will ambulate with LRAD to demonstrate improved LE strength and balance.  Baseline: 6/20: RW Goal status: INITIAL   LONG TERM GOALS: Target date: 09/13/2022  Patient will increase FOTO score to equal to or greater than 45  to demonstrate statistically significant improvement in mobility and quality of life.  Baseline: 6/20: 7 Goal status: INITIAL  2.  Patient will increase BLE gross strength to 4+/5 as to improve functional strength for independent gait, increased standing tolerance and increased ADL ability. Baseline: 6/20: see chart above  Goal status: INITIAL  3.  Patient  will ascend/descend 4 stairs without rail assist independently without loss of balance to improve ability to get in/out of home.  Baseline: 6/20: B rails, step to pattern Goal status: INITIAL   PLAN:  PT FREQUENCY: 1-2x/week  PT DURATION: 8 weeks  PLANNED INTERVENTIONS: Therapeutic exercises, Therapeutic activity, Neuromuscular re-education, Balance training, Gait training, Patient/Family education, Self Care, Joint mobilization, Stair training, DME instructions, Cryotherapy, Moist heat, scar mobilization, and Manual therapy  PLAN FOR NEXT SESSION: HEP review, LE strengthening in supine    Maylon Peppers, PT, DPT Physical Therapist - [facility]  [facility]  07/19/2022, 2:28 PM

## 2022-07-23 ENCOUNTER — Encounter: Payer: BC Managed Care – PPO | Admitting: Physical Therapy

## 2022-07-23 NOTE — Therapy (Signed)
OUTPATIENT PHYSICAL THERAPY LOWER EXTREMITY TREATMENT   Patient Name: Meagan Cox MRN: [id number] DOB:27-Jul-1940, 82 y.o., female Today's Date: 07/24/2022  END OF SESSION:  PT End of Session - 07/24/22 1123     Visit Number 2    Number of Visits 17    Date for PT Re-Evaluation 09/13/22    Authorization Type [employer]    Progress Note Due on Visit 10    PT Start Time 1115    PT Stop Time 1200    PT Time Calculation (min) 45 min    Activity Tolerance Patient tolerated treatment well    Behavior During Therapy WFL for tasks assessed/performed              Past Medical History:  Diagnosis Date   Hyperlipidemia    Hypertension    Hypothyroidism    Past Surgical History:  Procedure Laterality Date   ABDOMINAL HYSTERECTOMY     CARDIAC CATHETERIZATION     30 years ago, [place]   CHOLECYSTECTOMY N/A 08/21/2015   Procedure: LAPAROSCOPIC CHOLECYSTECTOMY;  Surgeon: Meagan Ro, MD;  Location: [facility];  Service: General;  Laterality: N/A;   EYE SURGERY     eye lid lift   KNEE ARTHROSCOPY Right 08/28/2018   Procedure: ARTHROSCOPY KNEE;  Surgeon: Meagan Herrlich, MD;  Location: [facility];  Service: Orthopedics;  Laterality: Right;   PARTIAL HYSTERECTOMY     REPLACEMENT TOTAL KNEE Right 2021   Meagan Cox   right hip replacement Right 2017   Meagan Cox   TONSILLECTOMY     Patient Active Problem List   Diagnosis Date Noted   Encounter for preventive care 12/30/2021   Asymptomatic postmenopausal estrogen deficiency 12/28/2021   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   Aortic atherosclerosis (HCC) 11/05/2020   Postsurgical menopause 06/09/2020   Breast cancer screening 05/04/2020   Travel advice encounter 10/19/2017   Loss of perception for taste 10/19/2017   Vertigo 10/19/2017   Essential hypertension 03/05/2017   S/P laparoscopic cholecystectomy 03/05/2017   Ptosis, both eyelids 01/04/2017   Complete tear, knee, anterior cruciate ligament 08/28/2016    History of total hip arthroplasty 08/28/2016   Tear of meniscus of knee 08/28/2016   Medicare annual wellness visit, subsequent 07/24/2015   Cerebrovascular small vessel disease 04/24/2015   Benign paroxysmal positional vertigo 04/24/2015   Right medial knee pain 04/24/2015   Shoulder pain, right 11/22/2013   Edema 05/14/2012   Polyarthritis of ankle 04/08/2011   Hypothyroidism 12/18/2010   Low back pain radiating to both legs 12/18/2010   Chronic right hip pain 12/18/2010   Obesity 05/09/2010   CAD (coronary artery disease) 05/09/2010   Hyperlipidemia 05/09/2010    PCP: Meagan Shams, MD  REFERRING PROVIDER: Altamese Cabal, PA-C  REFERRING DIAG: [id number] (ICD-10-CM) - S/P total left hip arthroplasty  THERAPY DIAG:  Status post total replacement of left hip  Other abnormalities of gait and mobility  Muscle weakness (generalized)  Decreased range of motion  Rationale for Evaluation and Treatment: Rehabilitation  ONSET DATE: 07/12/22  SUBJECTIVE:   SUBJECTIVE STATEMENT: Patient reports having L THA on 07/12/22 with [facility]. She was provided an HEP program that she has attempted to complete to the best of her ability. Was given pain medications and muscle relaxer. Initially had no pain but has been experiencing cramps in the posterior aspect of her leg.   PERTINENT HISTORY: S/p L THA on 07/12/22 PAIN:  Are you having pain? No - reports cramping in the posterior  aspect of her leg   PRECAUTIONS: Fall  WEIGHT BEARING RESTRICTIONS: Yes WBAT  FALLS:  Has patient fallen in last 6 months? No  LIVING ENVIRONMENT: Lives with: lives with their daughter Lives in: House/apartment Stairs: Yes: Internal: 2 steps; on right going up Has following equipment at home: Single point cane, Meagan Cox - 2 wheeled, and bed side commode  OCCUPATION: retired   PLOF: Independent  PATIENT GOALS: getting out of therapy  NEXT MD VISIT: 07/24/22  OBJECTIVE:   DIAGNOSTIC FINDINGS: N/A    PATIENT SURVEYS:  FOTO 7 with goal of 41  COGNITION: Overall cognitive status: Within functional limits for tasks assessed     SENSATION: WFL  POSTURE: rounded shoulders and forward head   LOWER EXTREMITY ROM:  Active ROM Right eval Left eval  Hip flexion  45 deg  Hip extension  8 deg   (Blank rows = not tested)  LOWER EXTREMITY MMT:  MMT Right eval Left eval  Hip flexion 4- 2+  Hip extension    Hip abduction    Hip adduction    Hip internal rotation    Hip external rotation    Knee flexion 4 4-*  Knee extension 4 4-*  Ankle dorsiflexion    Ankle plantarflexion    Ankle inversion    Ankle eversion     (Blank rows = not tested)    GAIT: Distance walked: 50' Assistive device utilized: Environmental consultant - 2 wheeled Level of assistance: SBA Comments: decreased stance time on L, decreased L knee flexion 2/2 leg length discrepancy, antalgic  STAIRS:  # of stairs: 4  Assistance: B rails, SBA   Comments: step to pattern leading with R LE   TODAY'S TREATMENT:                                                                                                                              DATE: 07/24/22   Subjective: Patient arrives with use of SBQC. Reports spasms in L hip continue with exercises. Has appointment this afternoon for staple removal   TE:  Nustep level 2 (seat 9) x 6 minutes Standing hip abduction x 10 each LE with B UE support -- complained of dizziness and hot flash  --> patient reports having more frequent "hot flashes" and requiring seated rest break. Intermittent mild dizziness with episodes   BP obtained in sitting 142/66 HR 75; standing 99/66 HR 88 BP sitting 119/67; standing 103/89; standing x 3 minutes 105/60 HR 91  Seated LAQ x 15 each LE  Seated hip adduction with ball squeeze x 10 with 5 second hold  Seated heel/toe raises x 20 each LE  Seated hip abduction with manual resistance by PT x 10  Sit to stand x 10 with B UE support- no reports of  dizziness or "hot flashes"   Discussed with patient and daughter about BP dropping with movement and to talk to MD this afternoon when she has her appointment. If  they begin to get worse, call the MD or go to the ED for evaluation.   PATIENT EDUCATION:  Education details: HEP, POC, goals  Person educated: Patient Education method: Explanation, Demonstration, and Handouts Education comprehension: verbalized understanding  HOME EXERCISE PROGRAM: Access Code: [email or web address] URL: [email or web address] Date: 07/19/2022 Prepared by: Maylon Peppers  Exercises - Seated Long Arc Quad  - 2-3 x daily - 5-7 x weekly - 3 sets - 10 reps - Seated March  - 2-3 x daily - 5-7 x weekly - 3 sets - 10 reps - Seated Heel Raise  - 2-3 x daily - 5-7 x weekly - 3 sets - 10 reps  ASSESSMENT:  CLINICAL IMPRESSION:   Patient arrives to session motivated to participate. Patient complaining of "hot flashes" during standing exercises and requiring seated rest break. With questioning, patient reports having more frequent bouts of these episodes. BP obtained initially with significant drop in systolic. SBP maintained in 100s. Discussed with patient and daughter to talk to MD this afternoon about BP and if it continues to get worse, call her MD or go to the ED for evaluation. Session focused on seated LE strengthening 2/2 BP. Ambulated within clinic with no AD with mild balance deficits and requires frequent cueing for upright posture. Patient will continue to benefit from skilled therapy to address remaining deficits in order to improve overall mobility and return to PLOF.    OBJECTIVE IMPAIRMENTS: Abnormal gait, decreased activity tolerance, decreased balance, decreased endurance, decreased knowledge of use of DME, decreased mobility, difficulty walking, decreased ROM, decreased strength, increased muscle spasms, impaired flexibility, and postural dysfunction.   ACTIVITY LIMITATIONS: bending, standing, squatting,  stairs, transfers, bed mobility, and locomotion level  PARTICIPATION LIMITATIONS: cleaning, laundry, and community activity  PERSONAL FACTORS: Age and Fitness are also affecting patient's functional outcome.   REHAB POTENTIAL: Good  CLINICAL DECISION MAKING: Stable/uncomplicated  EVALUATION COMPLEXITY: Low   GOALS: Goals reviewed with patient? Yes  SHORT TERM GOALS: Target date: 08/16/2022  Patient will be independent in HEP to improve strength/mobility for better functional independence with ADLs. Baseline: 6/20: HEP provided  Goal status: INITIAL  2.  Patient will ambulate with LRAD to demonstrate improved LE strength and balance.  Baseline: 6/20: RW Goal status: INITIAL   LONG TERM GOALS: Target date: 09/13/2022  Patient will increase FOTO score to equal to or greater than 45  to demonstrate statistically significant improvement in mobility and quality of life.  Baseline: 6/20: 7 Goal status: INITIAL  2.  Patient will increase BLE gross strength to 4+/5 as to improve functional strength for independent gait, increased standing tolerance and increased ADL ability. Baseline: 6/20: see chart above  Goal status: INITIAL  3.  Patient will ascend/descend 4 stairs without rail assist independently without loss of balance to improve ability to get in/out of home.  Baseline: 6/20: B rails, step to pattern Goal status: INITIAL   PLAN:  PT FREQUENCY: 1-2x/week  PT DURATION: 8 weeks  PLANNED INTERVENTIONS: Therapeutic exercises, Therapeutic activity, Neuromuscular re-education, Balance training, Gait training, Patient/Family education, Self Care, Joint mobilization, Stair training, DME instructions, Cryotherapy, Moist heat, scar mobilization, and Manual therapy  PLAN FOR NEXT SESSION: HEP review, LE strengthening in supine    Maylon Peppers, PT, DPT Physical Therapist - [facility]  [facility] [place]  07/24/2022, 12:15 PM

## 2022-07-24 ENCOUNTER — Ambulatory Visit: Payer: PPO

## 2022-07-24 ENCOUNTER — Encounter: Payer: Self-pay | Admitting: Physical Therapy

## 2022-07-24 DIAGNOSIS — M6281 Muscle weakness (generalized): Secondary | ICD-10-CM

## 2022-07-24 DIAGNOSIS — Z96642 Presence of left artificial hip joint: Secondary | ICD-10-CM

## 2022-07-24 DIAGNOSIS — R2689 Other abnormalities of gait and mobility: Secondary | ICD-10-CM

## 2022-07-24 DIAGNOSIS — M2560 Stiffness of unspecified joint, not elsewhere classified: Secondary | ICD-10-CM

## 2022-07-25 ENCOUNTER — Encounter: Payer: BC Managed Care – PPO | Admitting: Physical Therapy

## 2022-07-26 ENCOUNTER — Encounter: Payer: BC Managed Care – PPO | Admitting: Physical Therapy

## 2022-07-30 ENCOUNTER — Encounter: Payer: BC Managed Care – PPO | Admitting: Physical Therapy

## 2022-07-31 ENCOUNTER — Encounter: Payer: BC Managed Care – PPO | Admitting: Physical Therapy

## 2022-07-31 ENCOUNTER — Ambulatory Visit: Payer: BC Managed Care – PPO | Admitting: Physical Therapy

## 2022-08-01 ENCOUNTER — Encounter: Payer: BC Managed Care – PPO | Admitting: Physical Therapy

## 2022-08-03 DIAGNOSIS — Z96642 Presence of left artificial hip joint: Secondary | ICD-10-CM | POA: Diagnosis not present

## 2022-08-03 DIAGNOSIS — T8130XA Disruption of wound, unspecified, initial encounter: Secondary | ICD-10-CM | POA: Diagnosis not present

## 2022-08-06 ENCOUNTER — Ambulatory Visit: Payer: BC Managed Care – PPO | Admitting: Physical Therapy

## 2022-08-07 ENCOUNTER — Encounter: Payer: BC Managed Care – PPO | Admitting: Physical Therapy

## 2022-08-08 ENCOUNTER — Ambulatory Visit: Payer: BC Managed Care – PPO | Admitting: Physical Therapy

## 2022-08-09 ENCOUNTER — Encounter: Payer: BC Managed Care – PPO | Admitting: Physical Therapy

## 2022-08-13 ENCOUNTER — Ambulatory Visit: Payer: BC Managed Care – PPO | Admitting: Physical Therapy

## 2022-08-14 ENCOUNTER — Encounter: Payer: BC Managed Care – PPO | Admitting: Physical Therapy

## 2022-08-15 ENCOUNTER — Ambulatory Visit: Payer: BC Managed Care – PPO | Admitting: Physical Therapy

## 2022-08-16 ENCOUNTER — Encounter: Payer: BC Managed Care – PPO | Admitting: Physical Therapy

## 2022-08-17 DIAGNOSIS — Z96642 Presence of left artificial hip joint: Secondary | ICD-10-CM | POA: Diagnosis not present

## 2022-08-20 ENCOUNTER — Ambulatory Visit: Payer: BC Managed Care – PPO | Admitting: Physical Therapy

## 2022-08-21 ENCOUNTER — Other Ambulatory Visit: Payer: Self-pay

## 2022-08-21 ENCOUNTER — Encounter: Payer: BC Managed Care – PPO | Admitting: Physical Therapy

## 2022-08-21 MED ORDER — METOPROLOL SUCCINATE ER 25 MG PO TB24: 25.0000 mg | ORAL_TABLET | Freq: Every day | ORAL | 1 refills | Status: DC

## 2022-08-22 ENCOUNTER — Encounter: Payer: BC Managed Care – PPO | Admitting: Physical Therapy

## 2022-08-23 ENCOUNTER — Encounter: Payer: BC Managed Care – PPO | Admitting: Physical Therapy

## 2022-08-27 ENCOUNTER — Telehealth: Payer: Self-pay

## 2022-08-27 ENCOUNTER — Encounter: Payer: BC Managed Care – PPO | Admitting: Physical Therapy

## 2022-08-27 NOTE — Telephone Encounter (Signed)
LVM for patient to call back [phone], or to call PCP office to schedule follow up apt. AS, CMA  

## 2022-08-28 ENCOUNTER — Encounter: Payer: BC Managed Care – PPO | Admitting: Physical Therapy

## 2022-08-29 ENCOUNTER — Encounter: Payer: BC Managed Care – PPO | Admitting: Physical Therapy

## 2022-08-30 ENCOUNTER — Encounter: Payer: BC Managed Care – PPO | Admitting: Physical Therapy

## 2022-09-19 DIAGNOSIS — M7062 Trochanteric bursitis, left hip: Secondary | ICD-10-CM | POA: Diagnosis not present

## 2022-09-19 DIAGNOSIS — Z96642 Presence of left artificial hip joint: Secondary | ICD-10-CM | POA: Diagnosis not present

## 2022-09-19 DIAGNOSIS — M25552 Pain in left hip: Secondary | ICD-10-CM | POA: Diagnosis not present

## 2022-10-15 ENCOUNTER — Ambulatory Visit: Payer: PPO | Attending: Orthopedic Surgery | Admitting: Physical Therapy

## 2022-10-15 ENCOUNTER — Encounter: Payer: Self-pay | Admitting: Physical Therapy

## 2022-10-15 DIAGNOSIS — M6281 Muscle weakness (generalized): Secondary | ICD-10-CM | POA: Diagnosis not present

## 2022-10-15 DIAGNOSIS — M25552 Pain in left hip: Secondary | ICD-10-CM | POA: Diagnosis not present

## 2022-10-15 DIAGNOSIS — R2689 Other abnormalities of gait and mobility: Secondary | ICD-10-CM | POA: Diagnosis not present

## 2022-10-15 DIAGNOSIS — R6889 Other general symptoms and signs: Secondary | ICD-10-CM

## 2022-10-15 DIAGNOSIS — R269 Unspecified abnormalities of gait and mobility: Secondary | ICD-10-CM

## 2022-10-15 DIAGNOSIS — M25652 Stiffness of left hip, not elsewhere classified: Secondary | ICD-10-CM

## 2022-10-15 DIAGNOSIS — Z96642 Presence of left artificial hip joint: Secondary | ICD-10-CM | POA: Insufficient documentation

## 2022-10-15 NOTE — Therapy (Addendum)
OUTPATIENT PHYSICAL THERAPY LOWER EXTREMITY EVALUATION   Patient Name: Meagan Cox MRN: [id number] DOB:[birth year], 82 y.o., female Today's Date: 10/15/2022  END OF SESSION:  PT End of Session - 10/15/22 1139     Visit Number 1    Number of Visits 16    Date for PT Re-Evaluation 12/10/22    PT Start Time 0945    PT Stop Time 1041    PT Time Calculation (min) 56 min    Activity Tolerance Patient limited by pain    Behavior During Therapy [facility] for tasks assessed/performed             Past Medical History:  Diagnosis Date   Hyperlipidemia    Hypertension    Hypothyroidism    Past Surgical History:  Procedure Laterality Date   ABDOMINAL HYSTERECTOMY     CARDIAC CATHETERIZATION     30 years ago, [place]   CHOLECYSTECTOMY N/A 08/21/2015   Procedure: LAPAROSCOPIC CHOLECYSTECTOMY;  Surgeon: Leafy Ro, MD;  Location: [facility];  Service: General;  Laterality: N/A;   EYE SURGERY     eye lid lift   KNEE ARTHROSCOPY Right 08/28/2018   Procedure: ARTHROSCOPY KNEE;  Surgeon: Lyndle Herrlich, MD;  Location: [facility];  Service: Orthopedics;  Laterality: Right;   PARTIAL HYSTERECTOMY     REPLACEMENT TOTAL KNEE Right 2021   [place]   right hip replacement Right 2017   [facility]   TONSILLECTOMY     Patient Active Problem List   Diagnosis Date Noted   Encounter for preventive care 12/30/2021   Asymptomatic postmenopausal estrogen deficiency 12/28/2021   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   Aortic atherosclerosis (HCC) 11/05/2020   Postsurgical menopause 06/09/2020   Breast cancer screening 05/04/2020   Travel advice encounter 10/19/2017   Loss of perception for taste 10/19/2017   Vertigo 10/19/2017   Essential hypertension 03/05/2017   S/P laparoscopic cholecystectomy 03/05/2017   Ptosis, both eyelids 01/04/2017   Complete tear, knee, anterior cruciate ligament 08/28/2016   History of total hip arthroplasty 08/28/2016   Tear of meniscus of knee 08/28/2016    Medicare annual wellness visit, subsequent 07/24/2015   Cerebrovascular small vessel disease 04/24/2015   Benign paroxysmal positional vertigo 04/24/2015   Right medial knee pain 04/24/2015   Shoulder pain, right 11/22/2013   Edema 05/14/2012   Polyarthritis of ankle 04/08/2011   Hypothyroidism 12/18/2010   Low back pain radiating to both legs 12/18/2010   Chronic right hip pain 12/18/2010   Obesity 05/09/2010   CAD (coronary artery disease) 05/09/2010   Hyperlipidemia 05/09/2010    PCP: Sherlene Shams, MD  REFERRING PROVIDER: Lyndle Herrlich, MD  REFERRING DIAG: Trochanteric bursitis, left hip; pain in left hip  THERAPY DIAG:  Pain in left hip - Plan: PT plan of care cert/re-cert  Stiffness of left hip, not elsewhere classified - Plan: PT plan of care cert/re-cert  Muscle weakness (generalized) - Plan: PT plan of care cert/re-cert  Gait difficulty - Plan: PT plan of care cert/re-cert  Decreased activity tolerance - Plan: PT plan of care cert/re-cert  Rationale for Evaluation and Treatment: Rehabilitation  ONSET DATE: 09/07/2022  SUBJECTIVE:   SUBJECTIVE STATEMENT: Pt reports to PT with c/c of L lateral hip pain that start about 5 weeks ago. Pt had L hip replaced on 07/12/2022, which subsequently got infected on 08/01/2022. Pt reports insidious onset of most recent hip pain and describes it as tight/sharp. She had a cortisone shot and Prednisone from her doctor,  most recently on 09/19/2022. She denies any numbness/tingling or clicking/popping.   PERTINENT HISTORY: Hx of bilateral THA (2017), R TKA (2021), CAD, HTN PAIN:  Are you having pain? Yes: NPRS scale: 0/10 now, 8-9/10 at worst Pain location: L lateral hip Pain description: Sharp, tight Aggravating factors: Walking Relieving factors: Rest, wine  PRECAUTIONS: None  RED FLAGS: None   WEIGHT BEARING RESTRICTIONS: No  FALLS:  Has patient fallen in last 6 months?  No falls but several near falls where pt  describes losing her balance; she denies dizziness/lightheadedness  LIVING ENVIRONMENT: Lives with: lives with their daughter Lives in: House/apartment Stairs: Yes: External: 3 steps; can reach both Has following equipment at home: Retail banker - 2 wheeled  OCCUPATION: Retired  PLOF: Independent with household mobility with device (quad cane 24/7)  PATIENT GOALS: Get back to walking without pain, get ready for cruise next fall  NEXT MD VISIT: 11/30/2022  OBJECTIVE:   DIAGNOSTIC FINDINGS: N/A  PATIENT SURVEYS:  FOTO 36/55  COGNITION: Overall cognitive status: Within functional limits for tasks assessed     SENSATION: WFL  MUSCLE LENGTH: Hamstrings: Right 150 deg; Left 150 deg  POSTURE: No Significant postural limitations  LEG LENGTH: 34 inches bilaterally for true leg length (ASIS to medial malleolus)  LOWER EXTREMITY ROM:  Active ROM Right eval Left eval  Hip flexion 115 100  Hip extension 12 7  Hip abduction 13 7  Hip adduction    Hip internal rotation 10 53  Hip external rotation 5 26  Knee flexion [facility] WFL  Knee extension [facility] WFL  Ankle dorsiflexion WFL WFL   (Blank rows = not tested)  LOWER EXTREMITY MMT:  MMT Right eval Left eval  Hip flexion 3+ 3 !  Hip extension 4 3+  Hip abduction 3+ 3+  Hip adduction    Hip internal rotation 3+ 3+  Hip external rotation 4- 3+  Knee flexion 4- 4-  Knee extension 4+ 4+  Ankle dorsiflexion    Ankle plantarflexion    Ankle inversion    Ankle eversion     (Blank rows = not tested)  LOWER EXTREMITY SPECIAL TESTS:  Hip special tests: Luisa Hart (FABER) test: positive on L and (FADIR) test: negative bilaterally  FUNCTIONAL TESTS:  5 times sit to stand: 20.2 seconds (pt required use of Ue's on knees to complete each rep)  GAIT: Distance walked: 20 feet in clinic Assistive device utilized: Quad cane small base Level of assistance: Complete Independence Comments: Antalgic gait, (+)  trendelenburg noted on L side   TODAY'S TREATMENT:                                                                                                                              DATE: 10/15/2022  EVALUATION/ See HEP    PATIENT EDUCATION:  Education details: Pt educated on HEP form and frequency, role of PT going forward Person educated: Patient Education method: Explanation,  Demonstration, and Handouts Education comprehension: verbalized understanding and returned demonstration  HOME EXERCISE PROGRAM: Access Code: [email or web address] URL: [email or web address] Date: 10/15/2022 Prepared by: Dorene Grebe Exercises - Supine Figure 4 Piriformis Stretch - 2 x daily - 7 x weekly - 2 sets - 3 reps - 30 seconds hold - Supine Straight Leg Raises - 2 x daily - 4-5 x weekly - 2 sets - 10 reps - Seated Hamstring Stretch with Chair - 2 x daily - 7 x weekly - 2 sets - 3 reps - 30 second hold - Seated Hip Abduction - 1 x daily - 4-5 x weekly - 2 sets - 10 reps   ASSESSMENT:  CLINICAL IMPRESSION: Patient is a 82 y.o. female who was seen today for physical therapy evaluation and treatment for L lateral hip pain. Pt is limited at this time due to the following impairments: decreased hip musculature strength bilaterally (L > R), decreased L hip ROM, tightness of bilateral hamstrings and piriformis/glute musculature, as well as pain. Pt's pain is currently limiting her activity tolerance and function at home. Pt tested positive for FABER test on the L side, raising the likelihood that her symptoms are consisted with greater trochanteric pain syndrome/bursitis. Pt will benefit from continued PT services upon discharge to safely address deficits listed in patient problem list for decreased caregiver assistance and eventual return to PLOF.   OBJECTIVE IMPAIRMENTS: Abnormal gait, difficulty walking, decreased ROM, decreased strength, impaired flexibility, and pain.   ACTIVITY LIMITATIONS: bending, standing,  squatting, and locomotion level  PARTICIPATION LIMITATIONS: cleaning, laundry, community activity, and yard work  PERSONAL FACTORS: Age, Time since onset of injury/illness/exacerbation, and 3+ comorbidities: Hx of bilateral THA, hx of R TKA, CAD, HTN  are also affecting patient's functional outcome.   REHAB POTENTIAL: Good  CLINICAL DECISION MAKING: Stable/uncomplicated  EVALUATION COMPLEXITY: Low   GOALS:  SHORT TERM GOALS: Target date: 11/12/2022 Pt will be independent and compliant with HEP in order to improve bilateral distal hamstring length by at least 10 degrees for decreased L hip pain. Baseline: R/L 90-90 hamstring length = 150 degrees Goal status: INITIAL   LONG TERM GOALS: Target date: 12/03/2022  Pt will improve FOTO score to at least 55 in order to demonstrate subjective improvement in pain and ADL performance. Baseline: Initial 36 Goal status: INITIAL  2.  Pt will increase L hip AROM by at least 10 degrees in all planes to improve function with squatting/bending activities. Baseline: See above for initial ROM measurements  Goal status: INITIAL  3.  Pt will improve bilateral hip MMT scores to at least 4/5 in order to show improved hip musculature strength needed for improved walking/activity tolerance. Baseline: See above for initial MMT scores Goal status: INITIAL  4.  Pt will decrease 5TSTS by at least 3 seconds in order to demonstrate clinically significant improvement in LE strength. Baseline: 20.2 seconds with BUE support on knees Goal status: INITIAL  5.  Pt will report decrease of at least 3 points on NPS scale with walking in order to show improved walking tolerance. Baseline: 9/10 NPS scale with walking on initial evaluation Goal status: INITIAL  6.  Pt will demonstrate (-) FABER test on L side in order to show improvement in pain symptoms and return to PLOF. Baseline: (+) FABER on L Goal status: INITIAL   PLAN:  PT FREQUENCY: 2x/week  PT  DURATION: 8 weeks  PLANNED INTERVENTIONS: Therapeutic exercises, Therapeutic activity, Neuromuscular re-education, Balance training, Self Care, Joint mobilization, Cryotherapy, Moist heat, Manual  therapy, and Re-evaluation  PLAN FOR NEXT SESSION: Review HEP, introduce hip strengthening exercises (ankle weights, banded resistance, resisted gait), LE stretching  Cammie Mcgee, PT, DPT # [phone] Cena Benton, SPT 10/15/22, 5:55 PM

## 2022-10-17 ENCOUNTER — Ambulatory Visit: Payer: PPO | Admitting: Physical Therapy

## 2022-10-17 ENCOUNTER — Encounter: Payer: Self-pay | Admitting: Physical Therapy

## 2022-10-17 DIAGNOSIS — R269 Unspecified abnormalities of gait and mobility: Secondary | ICD-10-CM

## 2022-10-17 DIAGNOSIS — Z96642 Presence of left artificial hip joint: Secondary | ICD-10-CM

## 2022-10-17 DIAGNOSIS — M25552 Pain in left hip: Secondary | ICD-10-CM | POA: Diagnosis not present

## 2022-10-17 DIAGNOSIS — R6889 Other general symptoms and signs: Secondary | ICD-10-CM

## 2022-10-17 DIAGNOSIS — M6281 Muscle weakness (generalized): Secondary | ICD-10-CM

## 2022-10-17 DIAGNOSIS — M25652 Stiffness of left hip, not elsewhere classified: Secondary | ICD-10-CM

## 2022-10-17 DIAGNOSIS — R2689 Other abnormalities of gait and mobility: Secondary | ICD-10-CM

## 2022-10-17 NOTE — Therapy (Signed)
OUTPATIENT PHYSICAL THERAPY LOWER EXTREMITY TREATMENT   Patient Name: Meagan Cox MRN: [id number] DOB:Feb 10, 1940, 82 y.o., female Today's Date: 10/17/2022  END OF SESSION:  PT End of Session - 10/17/22 1018     Visit Number 2    Number of Visits 16    Date for PT Re-Evaluation 12/10/22    PT Start Time 0940    PT Stop Time 1030    PT Time Calculation (min) 50 min    Activity Tolerance Patient tolerated treatment well    Behavior During Therapy WFL for tasks assessed/performed              Past Medical History:  Diagnosis Date   Hyperlipidemia    Hypertension    Hypothyroidism    Past Surgical History:  Procedure Laterality Date   ABDOMINAL HYSTERECTOMY     CARDIAC CATHETERIZATION     30 years ago, [place]   CHOLECYSTECTOMY N/A 08/21/2015   Procedure: LAPAROSCOPIC CHOLECYSTECTOMY;  Surgeon: Leafy Ro, MD;  Location: [facility];  Service: General;  Laterality: N/A;   EYE SURGERY     eye lid lift   KNEE ARTHROSCOPY Right 08/28/2018   Procedure: ARTHROSCOPY KNEE;  Surgeon: Lyndle Herrlich, MD;  Location: [facility];  Service: Orthopedics;  Laterality: Right;   PARTIAL HYSTERECTOMY     REPLACEMENT TOTAL KNEE Right 2021   Bowers   right hip replacement Right 2017   Bowers   TONSILLECTOMY     Patient Active Problem List   Diagnosis Date Noted   Encounter for preventive care 12/30/2021   Asymptomatic postmenopausal estrogen deficiency 12/28/2021   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   Aortic atherosclerosis (HCC) 11/05/2020   Postsurgical menopause 06/09/2020   Breast cancer screening 05/04/2020   Travel advice encounter 10/19/2017   Loss of perception for taste 10/19/2017   Vertigo 10/19/2017   Essential hypertension 03/05/2017   S/P laparoscopic cholecystectomy 03/05/2017   Ptosis, both eyelids 01/04/2017   Complete tear, knee, anterior cruciate ligament 08/28/2016   History of total hip arthroplasty 08/28/2016   Tear of meniscus of knee  08/28/2016   Medicare annual wellness visit, subsequent 07/24/2015   Cerebrovascular small vessel disease 04/24/2015   Benign paroxysmal positional vertigo 04/24/2015   Right medial knee pain 04/24/2015   Shoulder pain, right 11/22/2013   Edema 05/14/2012   Polyarthritis of ankle 04/08/2011   Hypothyroidism 12/18/2010   Low back pain radiating to both legs 12/18/2010   Chronic right hip pain 12/18/2010   Obesity 05/09/2010   CAD (coronary artery disease) 05/09/2010   Hyperlipidemia 05/09/2010    PCP: Sherlene Shams, MD  REFERRING PROVIDER: Lyndle Herrlich, MD  REFERRING DIAG: Trochanteric bursitis, left hip; pain in left hip  THERAPY DIAG:  Pain in left hip  Stiffness of left hip, not elsewhere classified  Muscle weakness (generalized)  Gait difficulty  Decreased activity tolerance  Status post total replacement of left hip  Other abnormalities of gait and mobility  Rationale for Evaluation and Treatment: Rehabilitation  ONSET DATE: 09/07/2022  SUBJECTIVE:   SUBJECTIVE STATEMENT: Pt reports to PT with c/c of L lateral hip pain that start about 5 weeks ago. Pt had L hip replaced on 07/12/2022, which subsequently got infected on 08/01/2022. Pt reports insidious onset of most recent hip pain and describes it as tight/sharp. She had a cortisone shot and Prednisone from her doctor, most recently on 09/19/2022. She denies any numbness/tingling or clicking/popping.   PERTINENT HISTORY: Hx of bilateral THA (2017),  R TKA (2021), CAD, HTN PAIN:  Are you having pain? Yes: NPRS scale: 0/10 now, 8-9/10 at worst Pain location: L lateral hip Pain description: Sharp, tight Aggravating factors: Walking Relieving factors: Rest, wine  PRECAUTIONS: None  RED FLAGS: None   WEIGHT BEARING RESTRICTIONS: No  FALLS:  Has patient fallen in last 6 months?  No falls but several near falls where pt describes losing her balance; she denies dizziness/lightheadedness  LIVING  ENVIRONMENT: Lives with: lives with their daughter Lives in: House/apartment Stairs: Yes: External: 3 steps; can reach both Has following equipment at home: Retail banker - 2 wheeled  OCCUPATION: Retired  PLOF: Independent with household mobility with device (quad cane 24/7)  PATIENT GOALS: Get back to walking without pain, get ready for cruise next fall  NEXT MD VISIT: 11/30/2022  OBJECTIVE:   DIAGNOSTIC FINDINGS: N/A  PATIENT SURVEYS:  FOTO 36/55  COGNITION: Overall cognitive status: Within functional limits for tasks assessed     SENSATION: WFL  MUSCLE LENGTH: Hamstrings: Right 150 deg; Left 150 deg  POSTURE: No Significant postural limitations  LEG LENGTH: 34 inches bilaterally for true leg length (ASIS to medial malleolus)  LOWER EXTREMITY ROM:  Active ROM Right eval Left eval  Hip flexion 115 100  Hip extension 12 7  Hip abduction 13 7  Hip adduction    Hip internal rotation 10 53  Hip external rotation 5 26  Knee flexion [facility] WFL  Knee extension [facility] WFL  Ankle dorsiflexion WFL WFL   (Blank rows = not tested)  LOWER EXTREMITY MMT:  MMT Right eval Left eval  Hip flexion 3+ 3 !  Hip extension 4 3+  Hip abduction 3+ 3+  Hip adduction    Hip internal rotation 3+ 3+  Hip external rotation 4- 3+  Knee flexion 4- 4-  Knee extension 4+ 4+  Ankle dorsiflexion    Ankle plantarflexion    Ankle inversion    Ankle eversion     (Blank rows = not tested)  LOWER EXTREMITY SPECIAL TESTS:  Hip special tests: Luisa Hart (FABER) test: positive on L and (FADIR) test: negative bilaterally  FUNCTIONAL TESTS:  5 times sit to stand: 20.2 seconds (pt required use of Ue's on knees to complete each rep)  GAIT: Distance walked: 20 feet in clinic Assistive device utilized: Quad cane small base Level of assistance: Complete Independence Comments: Antalgic gait, (+) trendelenburg noted on L side   TODAY'S TREATMENT:                                                                                                                               DATE: 10/17/2022  Subjective: Pt reports she feels a lot better than her last session, she says she is still a little sore in her L hip. She says the stretches and exercises at home have been going well.  There.ex: Nustep lvl 4 x 10 minutes: to improve cardiovascular endurance  Walking marches: 4 laps, trialed with 2# AW but regressed to bodyweight secondary to weakness, pt's L hip was going into IR with each march  Resisted sidesteps with 2# AW: 4 laps, UE support as needed  Seated marches with 2# AW: 2 sets of 1 minute bouts  Seated LAQ: 1x30 alternating legs with 2# AW, 1x30 bodyweight  Supine LE stretching: hamstrings, piriformis, glutes x 8 minutes  Hooklying hip abduction/adduction isometrics against manual resistance: 5 x 5-6 second holds each  Hooklying marches: 1x1 minute bout  Hooklying bridge: 1x1 minute bout  Supine SLR: 1x10 each leg - pt demonstrates increased weakness on L side compared to R, she needed light assist from PT to reach full ROM on last reps   PATIENT EDUCATION:  Education details: Pt educated on HEP form and frequency, role of PT going forward Person educated: Patient Education method: Programmer, multimedia, Demonstration, and Handouts Education comprehension: verbalized understanding and returned demonstration  HOME EXERCISE PROGRAM: Access Code: [email or web address] URL: [email or web address] Date: 10/15/2022 Prepared by: Dorene Grebe Exercises - Supine Figure 4 Piriformis Stretch - 2 x daily - 7 x weekly - 2 sets - 3 reps - 30 seconds hold - Supine Straight Leg Raises - 2 x daily - 4-5 x weekly - 2 sets - 10 reps - Seated Hamstring Stretch with Chair - 2 x daily - 7 x weekly - 2 sets - 3 reps - 30 second hold - Seated Hip Abduction - 1 x daily - 4-5 x weekly - 2 sets - 10 reps   ASSESSMENT:  CLINICAL IMPRESSION: Pt presents to PT with reports of improved L hip  pain since the initial evaluation. Session today consisted of exercises aimed to improve pt's bilateral hip, quad, and hamstring strength. Pt demonstrates significant weakness in her L hip flexors when performing walking marches; her hip turns into IR due to weakness and she has to be cued to keep it in a smaller ROM with good form. Pt also struggles with SLR exercise and needed slight assist from PT to complete one full set of 10 reps on the L side. LE stretches performed at end of session as well; pt continues to demonstrate decreased flexibility in bilateral hamstrings. Pt will benefit from continued PT services upon discharge to safely address deficits listed in patient problem list for decreased caregiver assistance and eventual return to PLOF.   OBJECTIVE IMPAIRMENTS: Abnormal gait, difficulty walking, decreased ROM, decreased strength, impaired flexibility, and pain.   ACTIVITY LIMITATIONS: bending, standing, squatting, and locomotion level  PARTICIPATION LIMITATIONS: cleaning, laundry, community activity, and yard work  PERSONAL FACTORS: Age, Time since onset of injury/illness/exacerbation, and 3+ comorbidities: Hx of bilateral THA, hx of R TKA, CAD, HTN  are also affecting patient's functional outcome.   REHAB POTENTIAL: Good  CLINICAL DECISION MAKING: Stable/uncomplicated  EVALUATION COMPLEXITY: Low   GOALS:  SHORT TERM GOALS: Target date: 11/12/2022 Pt will be independent and compliant with HEP in order to improve bilateral distal hamstring length by at least 10 degrees for decreased L hip pain. Baseline: R/L 90-90 hamstring length = 150 degrees Goal status: INITIAL   LONG TERM GOALS: Target date: 12/03/2022  Pt will improve FOTO score to at least 55 in order to demonstrate subjective improvement in pain and ADL performance. Baseline: Initial 36 Goal status: INITIAL  2.  Pt will increase L hip AROM by at least 10 degrees in all planes to improve function with  squatting/bending activities. Baseline: See above for initial ROM measurements  Goal status:  INITIAL  3.  Pt will improve bilateral hip MMT scores to at least 4/5 in order to show improved hip musculature strength needed for improved walking/activity tolerance. Baseline: See above for initial MMT scores Goal status: INITIAL  4.  Pt will decrease 5TSTS by at least 3 seconds in order to demonstrate clinically significant improvement in LE strength. Baseline: 20.2 seconds with BUE support on knees Goal status: INITIAL  5.  Pt will report decrease of at least 3 points on NPS scale with walking in order to show improved walking tolerance. Baseline: 9/10 NPS scale with walking on initial evaluation Goal status: INITIAL  6.  Pt will demonstrate (-) FABER test on L side in order to show improvement in pain symptoms and return to PLOF. Baseline: (+) FABER on L Goal status: INITIAL   PLAN:  PT FREQUENCY: 2x/week  PT DURATION: 8 weeks  PLANNED INTERVENTIONS: Therapeutic exercises, Therapeutic activity, Neuromuscular re-education, Balance training, Self Care, Joint mobilization, Cryotherapy, Moist heat, Manual therapy, and Re-evaluation  PLAN FOR NEXT SESSION: Progress hip strengthening exercises (ankle weights, banded resistance, resisted gait), LE stretching (hamstrings, piriformis)  Cammie Mcgee, PT, DPT # [phone] Cena Benton, SPT 10/17/22, 10:47 AM

## 2022-10-23 ENCOUNTER — Ambulatory Visit: Payer: PPO | Admitting: Physical Therapy

## 2022-10-23 ENCOUNTER — Encounter: Payer: Self-pay | Admitting: Physical Therapy

## 2022-10-23 DIAGNOSIS — M25652 Stiffness of left hip, not elsewhere classified: Secondary | ICD-10-CM

## 2022-10-23 DIAGNOSIS — M25552 Pain in left hip: Secondary | ICD-10-CM | POA: Diagnosis not present

## 2022-10-23 DIAGNOSIS — R269 Unspecified abnormalities of gait and mobility: Secondary | ICD-10-CM

## 2022-10-23 DIAGNOSIS — R6889 Other general symptoms and signs: Secondary | ICD-10-CM

## 2022-10-23 DIAGNOSIS — M6281 Muscle weakness (generalized): Secondary | ICD-10-CM

## 2022-10-23 NOTE — Therapy (Signed)
OUTPATIENT PHYSICAL THERAPY LOWER EXTREMITY TREATMENT   Patient Name: Meagan Cox MRN: [id number] DOB:12/19/1940, 82 y.o., female Today's Date: 10/23/2022  END OF SESSION:  PT End of Session - 10/23/22 0943     Visit Number 3    Number of Visits 16    Date for PT Re-Evaluation 12/10/22    PT Start Time 0940    PT Stop Time 1026    PT Time Calculation (min) 46 min    Activity Tolerance Patient tolerated treatment well    Behavior During Therapy WFL for tasks assessed/performed              Past Medical History:  Diagnosis Date   Hyperlipidemia    Hypertension    Hypothyroidism    Past Surgical History:  Procedure Laterality Date   ABDOMINAL HYSTERECTOMY     CARDIAC CATHETERIZATION     30 years ago, [place]   CHOLECYSTECTOMY N/A 08/21/2015   Procedure: LAPAROSCOPIC CHOLECYSTECTOMY;  Surgeon: Leafy Ro, MD;  Location: [facility];  Service: General;  Laterality: N/A;   EYE SURGERY     eye lid lift   KNEE ARTHROSCOPY Right 08/28/2018   Procedure: ARTHROSCOPY KNEE;  Surgeon: Lyndle Herrlich, MD;  Location: [facility];  Service: Orthopedics;  Laterality: Right;   PARTIAL HYSTERECTOMY     REPLACEMENT TOTAL KNEE Right 2021   Bowers   right hip replacement Right 2017   Bowers   TONSILLECTOMY     Patient Active Problem List   Diagnosis Date Noted   Encounter for preventive care 12/30/2021   Asymptomatic postmenopausal estrogen deficiency 12/28/2021   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   Aortic atherosclerosis (HCC) 11/05/2020   Postsurgical menopause 06/09/2020   Breast cancer screening 05/04/2020   Travel advice encounter 10/19/2017   Loss of perception for taste 10/19/2017   Vertigo 10/19/2017   Essential hypertension 03/05/2017   S/P laparoscopic cholecystectomy 03/05/2017   Ptosis, both eyelids 01/04/2017   Complete tear, knee, anterior cruciate ligament 08/28/2016   History of total hip arthroplasty 08/28/2016   Tear of meniscus of knee  08/28/2016   Medicare annual wellness visit, subsequent 07/24/2015   Cerebrovascular small vessel disease 04/24/2015   Benign paroxysmal positional vertigo 04/24/2015   Right medial knee pain 04/24/2015   Shoulder pain, right 11/22/2013   Edema 05/14/2012   Polyarthritis of ankle 04/08/2011   Hypothyroidism 12/18/2010   Low back pain radiating to both legs 12/18/2010   Chronic right hip pain 12/18/2010   Obesity 05/09/2010   CAD (coronary artery disease) 05/09/2010   Hyperlipidemia 05/09/2010    PCP: Sherlene Shams, MD  REFERRING PROVIDER: Lyndle Herrlich, MD  REFERRING DIAG: Trochanteric bursitis, left hip; pain in left hip  THERAPY DIAG:  Pain in left hip  Stiffness of left hip, not elsewhere classified  Muscle weakness (generalized)  Gait difficulty  Decreased activity tolerance  Rationale for Evaluation and Treatment: Rehabilitation  ONSET DATE: 09/07/2022  SUBJECTIVE:   SUBJECTIVE STATEMENT: Pt reports to PT with c/c of L lateral hip pain that start about 5 weeks ago. Pt had L hip replaced on 07/12/2022, which subsequently got infected on 08/01/2022. Pt reports insidious onset of most recent hip pain and describes it as tight/sharp. She had a cortisone shot and Prednisone from her doctor, most recently on 09/19/2022. She denies any numbness/tingling or clicking/popping.   PERTINENT HISTORY: Hx of bilateral THA (2017), R TKA (2021), CAD, HTN PAIN:  Are you having pain? Yes: NPRS scale: 0/10  now, 8-9/10 at worst Pain location: L lateral hip Pain description: Sharp, tight Aggravating factors: Walking Relieving factors: Rest, wine  PRECAUTIONS: None  RED FLAGS: None   WEIGHT BEARING RESTRICTIONS: No  FALLS:  Has patient fallen in last 6 months?  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: Retail banker - 2 wheeled  OCCUPATION: Retired  PLOF: Independent with household mobility with device (quad cane 24/7)  PATIENT GOALS: Get back to walking without pain, get ready for cruise next fall  NEXT MD VISIT: 11/30/2022  OBJECTIVE:   DIAGNOSTIC FINDINGS: N/A  PATIENT SURVEYS:  FOTO 36/55  COGNITION: Overall cognitive status: Within functional limits for tasks assessed     SENSATION: WFL  MUSCLE LENGTH: Hamstrings: Right 150 deg; Left 150 deg  POSTURE: No Significant postural limitations  LEG LENGTH: 34 inches bilaterally for true leg length (ASIS to medial malleolus)  LOWER EXTREMITY ROM:  Active ROM Right eval Left eval  Hip flexion 115 100  Hip extension 12 7  Hip abduction 13 7  Hip adduction    Hip internal rotation 10 53  Hip external rotation 5 26  Knee flexion [facility] WFL  Knee extension [facility] WFL  Ankle dorsiflexion WFL WFL   (Blank rows = not tested)  LOWER EXTREMITY MMT:  MMT Right eval Left eval  Hip flexion 3+ 3 !  Hip extension 4 3+  Hip abduction 3+ 3+  Hip adduction    Hip internal rotation 3+ 3+  Hip external rotation 4- 3+  Knee flexion 4- 4-  Knee extension 4+ 4+  Ankle dorsiflexion    Ankle plantarflexion    Ankle inversion    Ankle eversion     (Blank rows = not tested)  LOWER EXTREMITY SPECIAL TESTS:  Hip special tests: Luisa Hart (FABER) test: positive on L and (FADIR) test: negative bilaterally  FUNCTIONAL TESTS:  5 times sit to stand: 20.2 seconds (pt required use of Ue's on knees to complete each rep)  GAIT: Distance walked: 20 feet in clinic Assistive device utilized: Quad cane small base Level of assistance: Complete Independence Comments: Antalgic gait, (+) trendelenburg noted on L side   TODAY'S TREATMENT:                                                                                                                               DATE: 10/23/2022  Subjective: Pt reports no new complaints.  Pt. Still reports L lateral hip/ glut. Muscle soreness, not pain.  Pt. Compliant with HEP.   Pt. Entered PT without use of SPC today.     There.ex:  Nustep L4 x 5 minutes with B UE/LE to improve cardiovascular endurance.  Discussed weekend activities.    6"/12" step touches at stairs (  B UE assist progressing to no UE assist)- SBA for safety.    Hallway walking with 2# AW:  (forward) with added alt. UE/LE touches.  Pt. Challenged with maintaining L knee/LE in midline.  Fatigue noted/ cuing to prevent hip IR.    Sidestepping with 2# ankle wts. At agility ladder 4 laps.    Seated marches/LAQ with 2# AW: 2 sets of 1 minute bouts  Supine LE stretching: hamstrings, piriformis, glutes x 6 minutes  Hooklying hip abduction with hands/adduction with ball isometrics against manual resistance: 10 x 5-6 second holds each  Hooklying marches: 10x2 on L/R  Hooklying bridge: 10x   Supine SLR: 1x10 each leg - pt demonstrates increased weakness on L side compared to R, no PT assist required today.     Walking in gym with mirror feedback (forward/backwards)- focus on maintaining LE midline/ heel strike.     PATIENT EDUCATION:  Education details: Pt educated on HEP form and frequency, role of PT going forward Person educated: Patient Education method: Explanation, Demonstration, and Handouts Education comprehension: verbalized understanding and returned demonstration  HOME EXERCISE PROGRAM: Access Code: [email or web address] URL: [email or web address] Date: 10/15/2022 Prepared by: Dorene Grebe Exercises - Supine Figure 4 Piriformis Stretch - 2 x daily - 7 x weekly - 2 sets - 3 reps - 30 seconds hold - Supine Straight Leg Raises - 2 x daily - 4-5 x weekly - 2 sets - 10 reps - Seated Hamstring Stretch with Chair - 2 x daily - 7 x weekly - 2 sets - 3 reps - 30 second hold - Seated Hip Abduction - 1 x daily - 4-5 x weekly - 2 sets - 10  reps   ASSESSMENT:  CLINICAL IMPRESSION: Pt presents to PT with reports of improved L hip pain since the initial evaluation. Session today consisted of exercises aimed to improve pt's bilateral hip, quad, and hamstring strength. Pt demonstrates significant weakness in her L hip flexors when performing walking marches; her hip turns into IR due to weakness and she has to be cued to keep it in a smaller ROM with good form.  LE stretches performed at end of session as well; pt continues to demonstrate decreased flexibility in bilateral hamstrings. Pt will benefit from continued PT services upon discharge to safely address deficits listed in patient problem list for decreased caregiver assistance and eventual return to PLOF.   OBJECTIVE IMPAIRMENTS: Abnormal gait, difficulty walking, decreased ROM, decreased strength, impaired flexibility, and pain.   ACTIVITY LIMITATIONS: bending, standing, squatting, and locomotion level  PARTICIPATION LIMITATIONS: cleaning, laundry, community activity, and yard work  PERSONAL FACTORS: Age, Time since onset of injury/illness/exacerbation, and 3+ comorbidities: Hx of bilateral THA, hx of R TKA, CAD, HTN  are also affecting patient's functional outcome.   REHAB POTENTIAL: Good  CLINICAL DECISION MAKING: Stable/uncomplicated  EVALUATION COMPLEXITY: Low   GOALS:  SHORT TERM GOALS: Target date: 11/12/2022 Pt will be independent and compliant with HEP in order to improve bilateral distal hamstring length by at least 10 degrees for decreased L hip pain. Baseline: R/L 90-90 hamstring length = 150 degrees Goal status: INITIAL   LONG TERM GOALS: Target date: 12/03/2022  Pt will improve FOTO score to at least 55 in order to demonstrate subjective improvement in pain and ADL performance. Baseline: Initial 36 Goal status: INITIAL  2.  Pt will increase L hip AROM by at least 10 degrees in all planes to improve function with squatting/bending  activities. Baseline: See above for initial ROM measurements  Goal status: INITIAL  3.  Pt will improve bilateral hip MMT scores to at least 4/5 in order to show improved hip musculature strength needed for improved walking/activity tolerance. Baseline: See above for initial MMT scores Goal status: INITIAL  4.  Pt will decrease 5TSTS by at least 3 seconds in order to demonstrate clinically significant improvement in LE strength. Baseline: 20.2 seconds with BUE support on knees Goal status: INITIAL  5.  Pt will report decrease of at least 3 points on NPS scale with walking in order to show improved walking tolerance. Baseline: 9/10 NPS scale with walking on initial evaluation Goal status: INITIAL  6.  Pt will demonstrate (-) FABER test on L side in order to show improvement in pain symptoms and return to PLOF. Baseline: (+) FABER on L Goal status: INITIAL   PLAN:  PT FREQUENCY: 2x/week  PT DURATION: 8 weeks  PLANNED INTERVENTIONS: Therapeutic exercises, Therapeutic activity, Neuromuscular re-education, Balance training, Self Care, Joint mobilization, Cryotherapy, Moist heat, Manual therapy, and Re-evaluation  PLAN FOR NEXT SESSION: Progress hip strengthening exercises (ankle weights, banded resistance, resisted gait), LE stretching (hamstrings, piriformis)  Cammie Mcgee, PT, DPT # [phone] 10/23/22, 10:43 AM

## 2022-10-25 ENCOUNTER — Encounter: Payer: Self-pay | Admitting: Physical Therapy

## 2022-10-25 ENCOUNTER — Ambulatory Visit: Payer: PPO | Admitting: Physical Therapy

## 2022-10-25 DIAGNOSIS — M25652 Stiffness of left hip, not elsewhere classified: Secondary | ICD-10-CM

## 2022-10-25 DIAGNOSIS — M25552 Pain in left hip: Secondary | ICD-10-CM | POA: Diagnosis not present

## 2022-10-25 DIAGNOSIS — R6889 Other general symptoms and signs: Secondary | ICD-10-CM

## 2022-10-25 DIAGNOSIS — M6281 Muscle weakness (generalized): Secondary | ICD-10-CM

## 2022-10-25 DIAGNOSIS — R269 Unspecified abnormalities of gait and mobility: Secondary | ICD-10-CM

## 2022-10-25 NOTE — Therapy (Signed)
OUTPATIENT PHYSICAL THERAPY LOWER EXTREMITY TREATMENT   Patient Name: Meagan Cox MRN: [id number] DOB:1940-04-29, 82 y.o., female Today's Date: 10/25/2022  END OF SESSION:  PT End of Session - 10/25/22 0940     Visit Number 4    Number of Visits 16    Date for PT Re-Evaluation 12/10/22    PT Start Time 0942    PT Stop Time 1027    PT Time Calculation (min) 45 min    Activity Tolerance Patient tolerated treatment well    Behavior During Therapy [facility] for tasks assessed/performed               Past Medical History:  Diagnosis Date   Hyperlipidemia    Hypertension    Hypothyroidism    Past Surgical History:  Procedure Laterality Date   ABDOMINAL HYSTERECTOMY     CARDIAC CATHETERIZATION     30 years ago, [place]   CHOLECYSTECTOMY N/A 08/21/2015   Procedure: LAPAROSCOPIC CHOLECYSTECTOMY;  Surgeon: Leafy Ro, MD;  Location: [facility];  Service: General;  Laterality: N/A;   EYE SURGERY     eye lid lift   KNEE ARTHROSCOPY Right 08/28/2018   Procedure: ARTHROSCOPY KNEE;  Surgeon: Lyndle Herrlich, MD;  Location: [facility];  Service: Orthopedics;  Laterality: Right;   PARTIAL HYSTERECTOMY     REPLACEMENT TOTAL KNEE Right 2021   Bowers   right hip replacement Right 2017   Bowers   TONSILLECTOMY     Patient Active Problem List   Diagnosis Date Noted   Encounter for preventive care 12/30/2021   Asymptomatic postmenopausal estrogen deficiency 12/28/2021   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   Aortic atherosclerosis (HCC) 11/05/2020   Postsurgical menopause 06/09/2020   Breast cancer screening 05/04/2020   Travel advice encounter 10/19/2017   Loss of perception for taste 10/19/2017   Vertigo 10/19/2017   Essential hypertension 03/05/2017   S/P laparoscopic cholecystectomy 03/05/2017   Ptosis, both eyelids 01/04/2017   Complete tear, knee, anterior cruciate ligament 08/28/2016   History of total hip arthroplasty 08/28/2016   Tear of meniscus of knee  08/28/2016   Medicare annual wellness visit, subsequent 07/24/2015   Cerebrovascular small vessel disease 04/24/2015   Benign paroxysmal positional vertigo 04/24/2015   Right medial knee pain 04/24/2015   Shoulder pain, right 11/22/2013   Edema 05/14/2012   Polyarthritis of ankle 04/08/2011   Hypothyroidism 12/18/2010   Low back pain radiating to both legs 12/18/2010   Chronic right hip pain 12/18/2010   Obesity 05/09/2010   CAD (coronary artery disease) 05/09/2010   Hyperlipidemia 05/09/2010    PCP: Sherlene Shams, MD  REFERRING PROVIDER: Lyndle Herrlich, MD  REFERRING DIAG: Trochanteric bursitis, left hip; pain in left hip  THERAPY DIAG:  Pain in left hip  Stiffness of left hip, not elsewhere classified  Muscle weakness (generalized)  Gait difficulty  Decreased activity tolerance  Rationale for Evaluation and Treatment: Rehabilitation  ONSET DATE: 09/07/2022  SUBJECTIVE:   SUBJECTIVE STATEMENT: Pt reports to PT with c/c of L lateral hip pain that start about 5 weeks ago. Pt had L hip replaced on 07/12/2022, which subsequently got infected on 08/01/2022. Pt reports insidious onset of most recent hip pain and describes it as tight/sharp. She had a cortisone shot and Prednisone from her doctor, most recently on 09/19/2022. She denies any numbness/tingling or clicking/popping.   PERTINENT HISTORY: Hx of bilateral THA (2017), R TKA (2021), CAD, HTN PAIN:  Are you having pain? Yes: NPRS scale:  0/10 now, 8-9/10 at worst Pain location: L lateral hip Pain description: Sharp, tight Aggravating factors: Walking Relieving factors: Rest, wine  PRECAUTIONS: None  RED FLAGS: None   WEIGHT BEARING RESTRICTIONS: No  FALLS:  Has patient fallen in last 6 months?  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: Retail banker - 2 wheeled  OCCUPATION: Retired  PLOF: Independent with household mobility with device (quad cane 24/7)  PATIENT GOALS: Get back to walking without pain, get ready for cruise next fall  NEXT MD VISIT: 11/30/2022  OBJECTIVE:   DIAGNOSTIC FINDINGS: N/A  PATIENT SURVEYS:  FOTO 36/55  COGNITION: Overall cognitive status: Within functional limits for tasks assessed     SENSATION: WFL  MUSCLE LENGTH: Hamstrings: Right 150 deg; Left 150 deg  POSTURE: No Significant postural limitations  LEG LENGTH: 34 inches bilaterally for true leg length (ASIS to medial malleolus)  LOWER EXTREMITY ROM:  Active ROM Right eval Left eval  Hip flexion 115 100  Hip extension 12 7  Hip abduction 13 7  Hip adduction    Hip internal rotation 10 53  Hip external rotation 5 26  Knee flexion [facility] WFL  Knee extension [facility] WFL  Ankle dorsiflexion WFL WFL   (Blank rows = not tested)  LOWER EXTREMITY MMT:  MMT Right eval Left eval  Hip flexion 3+ 3 !  Hip extension 4 3+  Hip abduction 3+ 3+  Hip adduction    Hip internal rotation 3+ 3+  Hip external rotation 4- 3+  Knee flexion 4- 4-  Knee extension 4+ 4+  Ankle dorsiflexion    Ankle plantarflexion    Ankle inversion    Ankle eversion     (Blank rows = not tested)  LOWER EXTREMITY SPECIAL TESTS:  Hip special tests: Luisa Hart (FABER) test: positive on L and (FADIR) test: negative bilaterally  FUNCTIONAL TESTS:  5 times sit to stand: 20.2 seconds (pt required use of Ue's on knees to complete each rep)  GAIT: Distance walked: 20 feet in clinic Assistive device utilized: Quad cane small base Level of assistance: Complete Independence Comments: Antalgic gait, (+) trendelenburg noted on L side   TODAY'S TREATMENT:                                                                                                                               DATE: 10/25/2022  Subjective: Pt reports she is very sore in her L hip today, she reports 8/10 pain/soreness right now. She says it is sore from the session and her exercises at home.    There.ex:  Nustep L4 x 5 minutes with B UE/LE to improve cardiovascular endurance.  Discussed weekend activities.    Hooklying bodyweight marches: 2x1 minute bouts  SLR: 2x10 each leg  SAQ: 2x15 each leg  Hip abduction/adduction: 2x10 each leg; increased L hip pain/soreness on second set  Supine LE stretching: hamstrings, piriformis, glutes x 10 minutes  Cold pack applied to L hip x 10 minutes at end of session   Not today: Sidestepping with 2# ankle wts. At agility ladder 4 laps.    6"/12" step touches at stairs (B UE assist progressing to no UE assist)- SBA for safety.   Hallway walking with 2# AW:  (forward) with added alt. UE/LE touches.  Pt. Challenged with maintaining L knee/LE in midline.  Fatigue noted/ cuing to prevent hip IR.     PATIENT EDUCATION:  Education details: Pt educated on HEP form and frequency, role of PT going forward Person educated: Patient Education method: Explanation, Demonstration, and Handouts Education comprehension: verbalized understanding and returned demonstration  HOME EXERCISE PROGRAM: Access Code: [email or web address] URL: [email or web address] Date: 10/15/2022 Prepared by: Dorene Grebe Exercises - Supine Figure 4 Piriformis Stretch - 2 x daily - 7 x weekly - 2 sets - 3 reps - 30 seconds hold - Supine Straight Leg Raises - 2 x daily - 4-5 x weekly - 2 sets - 10 reps - Seated Hamstring Stretch with Chair - 2 x daily - 7 x weekly - 2 sets - 3 reps - 30 second hold - Seated Hip Abduction - 1 x daily - 4-5 x weekly - 2 sets - 10 reps   ASSESSMENT:  CLINICAL IMPRESSION: Pt presents to PT with reports of increased pain and soreness in her L hip since last session. Due to patient's increased irritability and soreness, today's session consisted mostly of  gentle LE stretching and lower-level bodyweight strengthening exercises. Pt continues to demonstrate decreased hamstring flexibility bilaterally; she experiences increased L hip soreness with end-ranges for all stretches but particularly for piriformis and lumbar trunk rotation. Pt's SLR on the L has shown great improvements since the initial evaluation; she was able to complete two full sets today without any help from the PT at all. Ice applied to L hip at end of session to decrease pain and soreness. Pt educated on the importance of recovery days and making sure she is not performing her strengthening exercises every day. Pt also educated on utilizing ice/cold packs at home to help improve soreness/pain. Pt will benefit from continued PT services upon discharge to safely address deficits listed in patient problem list for decreased caregiver assistance and eventual return to PLOF.   OBJECTIVE IMPAIRMENTS: Abnormal gait, difficulty walking, decreased ROM, decreased strength, impaired flexibility, and pain.   ACTIVITY LIMITATIONS: bending, standing, squatting, and locomotion level  PARTICIPATION LIMITATIONS: cleaning, laundry, community activity, and yard work  PERSONAL FACTORS: Age, Time since onset of injury/illness/exacerbation, and 3+ comorbidities: Hx of bilateral THA, hx of R TKA, CAD, HTN  are also affecting patient's functional outcome.   REHAB POTENTIAL: Good  CLINICAL DECISION MAKING: Stable/uncomplicated  EVALUATION COMPLEXITY: Low   GOALS:  SHORT TERM GOALS: Target date: 11/12/2022 Pt will be independent and compliant with HEP in order to improve bilateral distal hamstring length by at least 10 degrees for decreased L hip pain. Baseline: R/L 90-90 hamstring length = 150 degrees Goal status: INITIAL   LONG TERM GOALS: Target date: 12/03/2022  Pt will improve FOTO score to at least 55 in order to demonstrate subjective improvement in pain and ADL performance. Baseline: Initial  36 Goal status: INITIAL  2.  Pt will increase L hip AROM by at least 10 degrees  in all planes to improve function with squatting/bending activities. Baseline: See above for initial ROM measurements  Goal status: INITIAL  3.  Pt will improve bilateral hip MMT scores to at least 4/5 in order to show improved hip musculature strength needed for improved walking/activity tolerance. Baseline: See above for initial MMT scores Goal status: INITIAL  4.  Pt will decrease 5TSTS by at least 3 seconds in order to demonstrate clinically significant improvement in LE strength. Baseline: 20.2 seconds with BUE support on knees Goal status: INITIAL  5.  Pt will report decrease of at least 3 points on NPS scale with walking in order to show improved walking tolerance. Baseline: 9/10 NPS scale with walking on initial evaluation Goal status: INITIAL  6.  Pt will demonstrate (-) FABER test on L side in order to show improvement in pain symptoms and return to PLOF. Baseline: (+) FABER on L Goal status: INITIAL   PLAN:  PT FREQUENCY: 2x/week  PT DURATION: 8 weeks  PLANNED INTERVENTIONS: Therapeutic exercises, Therapeutic activity, Neuromuscular re-education, Balance training, Self Care, Joint mobilization, Cryotherapy, Moist heat, Manual therapy, and Re-evaluation  PLAN FOR NEXT SESSION: Progress hip strengthening exercises (ankle weights, banded resistance, resisted gait), LE stretching (hamstrings, piriformis, trunk rotation), follow-up on ice and decreasing HEP frequency  Cammie Mcgee, PT, DPT # [phone] Cena Benton, SPT 10/25/22, 11:30 AM

## 2022-10-29 ENCOUNTER — Ambulatory Visit: Payer: PPO | Admitting: Physical Therapy

## 2022-10-29 ENCOUNTER — Encounter: Payer: Self-pay | Admitting: Physical Therapy

## 2022-10-29 DIAGNOSIS — R269 Unspecified abnormalities of gait and mobility: Secondary | ICD-10-CM

## 2022-10-29 DIAGNOSIS — M25652 Stiffness of left hip, not elsewhere classified: Secondary | ICD-10-CM

## 2022-10-29 DIAGNOSIS — M25552 Pain in left hip: Secondary | ICD-10-CM

## 2022-10-29 DIAGNOSIS — M6281 Muscle weakness (generalized): Secondary | ICD-10-CM

## 2022-10-29 DIAGNOSIS — R6889 Other general symptoms and signs: Secondary | ICD-10-CM

## 2022-10-29 NOTE — Therapy (Signed)
OUTPATIENT PHYSICAL THERAPY LOWER EXTREMITY TREATMENT   Patient Name: Meagan Cox MRN: [id number] DOB:[birth year], 82 y.o., female Today's Date: 10/29/2022  END OF SESSION:  PT End of Session - 10/29/22 0905     Visit Number 5    Number of Visits 16    Date for PT Re-Evaluation 12/10/22    PT Start Time 0902    PT Stop Time 0942    PT Time Calculation (min) 40 min    Activity Tolerance Patient tolerated treatment well    Behavior During Therapy [facility] for tasks assessed/performed             Past Medical History:  Diagnosis Date   Hyperlipidemia    Hypertension    Hypothyroidism    Past Surgical History:  Procedure Laterality Date   ABDOMINAL HYSTERECTOMY     CARDIAC CATHETERIZATION     30 years ago, [place]   CHOLECYSTECTOMY N/A 08/21/2015   Procedure: LAPAROSCOPIC CHOLECYSTECTOMY;  Surgeon: Leafy Ro, MD;  Location: [facility];  Service: General;  Laterality: N/A;   EYE SURGERY     eye lid lift   KNEE ARTHROSCOPY Right 08/28/2018   Procedure: ARTHROSCOPY KNEE;  Surgeon: Lyndle Herrlich, MD;  Location: [facility];  Service: Orthopedics;  Laterality: Right;   PARTIAL HYSTERECTOMY     REPLACEMENT TOTAL KNEE Right 2021   Bowers   right hip replacement Right 2017   Bowers   TONSILLECTOMY     Patient Active Problem List   Diagnosis Date Noted   Encounter for preventive care 12/30/2021   Asymptomatic postmenopausal estrogen deficiency 12/28/2021   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   Aortic atherosclerosis (HCC) 11/05/2020   Postsurgical menopause 06/09/2020   Breast cancer screening 05/04/2020   Travel advice encounter 10/19/2017   Loss of perception for taste 10/19/2017   Vertigo 10/19/2017   Essential hypertension 03/05/2017   S/P laparoscopic cholecystectomy 03/05/2017   Ptosis, both eyelids 01/04/2017   Complete tear, knee, anterior cruciate ligament 08/28/2016   History of total hip arthroplasty 08/28/2016   Tear of meniscus of knee 08/28/2016    Medicare annual wellness visit, subsequent 07/24/2015   Cerebrovascular small vessel disease 04/24/2015   Benign paroxysmal positional vertigo 04/24/2015   Right medial knee pain 04/24/2015   Shoulder pain, right 11/22/2013   Edema 05/14/2012   Polyarthritis of ankle 04/08/2011   Hypothyroidism 12/18/2010   Low back pain radiating to both legs 12/18/2010   Chronic right hip pain 12/18/2010   Obesity 05/09/2010   CAD (coronary artery disease) 05/09/2010   Hyperlipidemia 05/09/2010    PCP: Sherlene Shams, MD  REFERRING PROVIDER: Lyndle Herrlich, MD  REFERRING DIAG: Trochanteric bursitis, left hip; pain in left hip  THERAPY DIAG:  Pain in left hip  Stiffness of left hip, not elsewhere classified  Muscle weakness (generalized)  Gait difficulty  Decreased activity tolerance  Rationale for Evaluation and Treatment: Rehabilitation  ONSET DATE: 09/07/2022  SUBJECTIVE:   SUBJECTIVE STATEMENT: Pt reports to PT with c/c of L lateral hip pain that start about 5 weeks ago. Pt had L hip replaced on 07/12/2022, which subsequently got infected on 08/01/2022. Pt reports insidious onset of most recent hip pain and describes it as tight/sharp. She had a cortisone shot and Prednisone from her doctor, most recently on 09/19/2022. She denies any numbness/tingling or clicking/popping.   PERTINENT HISTORY: Hx of bilateral THA (2017), R TKA (2021), CAD, HTN PAIN:  Are you having pain? Yes: NPRS scale: 0/10 now,  8-9/10 at worst Pain location: L lateral hip Pain description: Sharp, tight Aggravating factors: Walking Relieving factors: Rest, wine  PRECAUTIONS: None  RED FLAGS: None   WEIGHT BEARING RESTRICTIONS: No  FALLS:  Has patient fallen in last 6 months?  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: Retail banker - 2 wheeled  OCCUPATION: Retired  PLOF: Independent with household mobility with device (quad cane 24/7)  PATIENT GOALS: Get back to walking without pain, get ready for cruise next fall  NEXT MD VISIT: 11/30/2022  OBJECTIVE:   DIAGNOSTIC FINDINGS: N/A  PATIENT SURVEYS:  FOTO 36/55  COGNITION: Overall cognitive status: Within functional limits for tasks assessed     SENSATION: WFL  MUSCLE LENGTH: Hamstrings: Right 150 deg; Left 150 deg  POSTURE: No Significant postural limitations  LEG LENGTH: 34 inches bilaterally for true leg length (ASIS to medial malleolus)  LOWER EXTREMITY ROM:  Active ROM Right eval Left eval  Hip flexion 115 100  Hip extension 12 7  Hip abduction 13 7  Hip adduction    Hip internal rotation 10 53  Hip external rotation 5 26  Knee flexion [facility] WFL  Knee extension [facility] WFL  Ankle dorsiflexion WFL WFL   (Blank rows = not tested)  LOWER EXTREMITY MMT:  MMT Right eval Left eval  Hip flexion 3+ 3 !  Hip extension 4 3+  Hip abduction 3+ 3+  Hip adduction    Hip internal rotation 3+ 3+  Hip external rotation 4- 3+  Knee flexion 4- 4-  Knee extension 4+ 4+  Ankle dorsiflexion    Ankle plantarflexion    Ankle inversion    Ankle eversion     (Blank rows = not tested)  LOWER EXTREMITY SPECIAL TESTS:  Hip special tests: Luisa Hart (FABER) test: positive on L and (FADIR) test: negative bilaterally  FUNCTIONAL TESTS:  5 times sit to stand: 20.2 seconds (pt required use of Ue's on knees to complete each rep)  GAIT: Distance walked: 20 feet in clinic Assistive device utilized: Quad cane small base Level of assistance: Complete Independence Comments: Antalgic gait, (+) trendelenburg noted on L side   TODAY'S TREATMENT:                                                                                                                              DATE:  10/29/2022  Subjective: Pt reports 6-7/10 L hip pain/soreness this morning; she reports it feels improved since last session though. She did not do her exercises other than the stretches over the weekend to try and get her pain levels down.  There.ex:  Nustep L3 x 6 minutes with B UE/LE to improve cardiovascular endurance.  Discussed weekend activities.    Supine LE stretching:  hamstrings, piriformis, glutes x 8 minutes - at start of session  High knees and sidesteps in // bars: 3 laps each, 2# AW   3-way hip with 2# AW: 2x8 each side  Hooklying bridge: 2x10  SLR: 2x10 each leg  Resisted Gait with Single black TB: 5x each direction (fwd/back/lateral) - regressed to single band secondary to decreased eccentric control with two bands    Not today: 6"/12" step touches at stairs (B UE assist progressing to no UE assist)- SBA for safety.   Hallway walking with 2# AW:  (forward) with added alt. UE/LE touches.  Pt. Challenged with maintaining L knee/LE in midline.  Fatigue noted/ cuing to prevent hip IR.     PATIENT EDUCATION:  Education details: Pt educated on HEP form and frequency, role of PT going forward Person educated: Patient Education method: Explanation, Demonstration, and Handouts Education comprehension: verbalized understanding and returned demonstration  HOME EXERCISE PROGRAM: Access Code: [email or web address] URL: [email or web address] Date: 10/15/2022 Prepared by: Dorene Grebe Exercises - Supine Figure 4 Piriformis Stretch - 2 x daily - 7 x weekly - 2 sets - 3 reps - 30 seconds hold - Supine Straight Leg Raises - 2 x daily - 4-5 x weekly - 2 sets - 10 reps - Seated Hamstring Stretch with Chair - 2 x daily - 7 x weekly - 2 sets - 3 reps - 30 second hold - Seated Hip Abduction - 1 x daily - 4-5 x weekly - 2 sets - 10 reps   ASSESSMENT:  CLINICAL IMPRESSION: Pt presents to PT with reports of moderate pain and soreness in her L hip, but improved since last session. LE  and lumbar stretches performed at beginning of session to improve bilateral hamstring, piriformis and lumbar flexibility. Pt continues to demonstrate limited hamstring flexibility bilaterally. Exercises today focused on improving pt's hip, quad, and hamstring strength. Pt demonstrated improvements with walking marches today; less compensation of her L hip going into IR wit marches noted compared to initial treatment sessions. Pt demonstrates decreased eccentric control with resisted gait; exercise regressed to using just one black band instead of two and pt better able to control the return in each direction. Pt still experienced L hip soreness at end of session so she was encouraged to continue using ice at home as needed to help with pain. Pt will benefit from continued PT services upon discharge to safely address deficits listed in patient problem list for decreased caregiver assistance and eventual return to PLOF.   OBJECTIVE IMPAIRMENTS: Abnormal gait, difficulty walking, decreased ROM, decreased strength, impaired flexibility, and pain.   ACTIVITY LIMITATIONS: bending, standing, squatting, and locomotion level  PARTICIPATION LIMITATIONS: cleaning, laundry, community activity, and yard work  PERSONAL FACTORS: Age, Time since onset of injury/illness/exacerbation, and 3+ comorbidities: Hx of bilateral THA, hx of R TKA, CAD, HTN  are also affecting patient's functional outcome.   REHAB POTENTIAL: Good  CLINICAL DECISION MAKING: Stable/uncomplicated  EVALUATION COMPLEXITY: Low   GOALS:  SHORT TERM GOALS: Target date: 11/12/2022 Pt will be independent and compliant with HEP in order to improve bilateral distal hamstring length by at least 10 degrees for decreased L hip pain. Baseline: R/L 90-90 hamstring length = 150 degrees Goal status: INITIAL   LONG TERM GOALS: Target date: 12/03/2022  Pt will improve FOTO score to at least 55 in order to demonstrate subjective improvement in pain and  ADL performance. Baseline: Initial 36 Goal status: INITIAL  2.  Pt will increase L hip AROM by at  least 10 degrees in all planes to improve function with squatting/bending activities. Baseline: See above for initial ROM measurements  Goal status: INITIAL  3.  Pt will improve bilateral hip MMT scores to at least 4/5 in order to show improved hip musculature strength needed for improved walking/activity tolerance. Baseline: See above for initial MMT scores Goal status: INITIAL  4.  Pt will decrease 5TSTS by at least 3 seconds in order to demonstrate clinically significant improvement in LE strength. Baseline: 20.2 seconds with BUE support on knees Goal status: INITIAL  5.  Pt will report decrease of at least 3 points on NPS scale with walking in order to show improved walking tolerance. Baseline: 9/10 NPS scale with walking on initial evaluation Goal status: INITIAL  6.  Pt will demonstrate (-) FABER test on L side in order to show improvement in pain symptoms and return to PLOF. Baseline: (+) FABER on L Goal status: INITIAL   PLAN:  PT FREQUENCY: 2x/week  PT DURATION: 8 weeks  PLANNED INTERVENTIONS: Therapeutic exercises, Therapeutic activity, Neuromuscular re-education, Balance training, Self Care, Joint mobilization, Cryotherapy, Moist heat, Manual therapy, and Re-evaluation  PLAN FOR NEXT SESSION: Progress hip strengthening exercises (ankle weights, banded resistance, resisted gait), LE stretching (hamstrings, piriformis, trunk rotation), follow-up on ice and decreasing HEP frequency   Cammie Mcgee, PT, DPT # [id number] Cena Benton, SPT 10/29/22, 12:13 PM

## 2022-10-31 ENCOUNTER — Encounter: Payer: Self-pay | Admitting: Physical Therapy

## 2022-10-31 ENCOUNTER — Ambulatory Visit: Payer: PPO | Attending: Orthopedic Surgery | Admitting: Physical Therapy

## 2022-10-31 DIAGNOSIS — Z96642 Presence of left artificial hip joint: Secondary | ICD-10-CM | POA: Insufficient documentation

## 2022-10-31 DIAGNOSIS — M25652 Stiffness of left hip, not elsewhere classified: Secondary | ICD-10-CM | POA: Diagnosis not present

## 2022-10-31 DIAGNOSIS — R6889 Other general symptoms and signs: Secondary | ICD-10-CM | POA: Diagnosis not present

## 2022-10-31 DIAGNOSIS — M6281 Muscle weakness (generalized): Secondary | ICD-10-CM | POA: Diagnosis not present

## 2022-10-31 DIAGNOSIS — R269 Unspecified abnormalities of gait and mobility: Secondary | ICD-10-CM | POA: Diagnosis not present

## 2022-10-31 DIAGNOSIS — M25552 Pain in left hip: Secondary | ICD-10-CM | POA: Diagnosis not present

## 2022-10-31 NOTE — Therapy (Signed)
OUTPATIENT PHYSICAL THERAPY LOWER EXTREMITY TREATMENT   Patient Name: Meagan Cox MRN: [id number] DOB:04/01/1940, 82 y.o., female Today's Date: 10/31/2022  END OF SESSION:  PT End of Session - 10/31/22 0904     Visit Number 6    Number of Visits 16    Date for PT Re-Evaluation 12/10/22    PT Start Time 0900    PT Stop Time 0941    PT Time Calculation (min) 41 min    Activity Tolerance Patient tolerated treatment well    Behavior During Therapy [facility] for tasks assessed/performed              Past Medical History:  Diagnosis Date   Hyperlipidemia    Hypertension    Hypothyroidism    Past Surgical History:  Procedure Laterality Date   ABDOMINAL HYSTERECTOMY     CARDIAC CATHETERIZATION     30 years ago, [place]   CHOLECYSTECTOMY N/A 08/21/2015   Procedure: LAPAROSCOPIC CHOLECYSTECTOMY;  Surgeon: Leafy Ro, MD;  Location: [facility];  Service: General;  Laterality: N/A;   EYE SURGERY     eye lid lift   KNEE ARTHROSCOPY Right 08/28/2018   Procedure: ARTHROSCOPY KNEE;  Surgeon: Lyndle Herrlich, MD;  Location: [facility];  Service: Orthopedics;  Laterality: Right;   PARTIAL HYSTERECTOMY     REPLACEMENT TOTAL KNEE Right 2021   Bowers   right hip replacement Right 2017   Bowers   TONSILLECTOMY     Patient Active Problem List   Diagnosis Date Noted   Encounter for preventive care 12/30/2021   Asymptomatic postmenopausal estrogen deficiency 12/28/2021   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   Aortic atherosclerosis (HCC) 11/05/2020   Postsurgical menopause 06/09/2020   Breast cancer screening 05/04/2020   Travel advice encounter 10/19/2017   Loss of perception for taste 10/19/2017   Vertigo 10/19/2017   Essential hypertension 03/05/2017   S/P laparoscopic cholecystectomy 03/05/2017   Ptosis, both eyelids 01/04/2017   Complete tear, knee, anterior cruciate ligament 08/28/2016   History of total hip arthroplasty 08/28/2016   Tear of meniscus of knee  08/28/2016   Medicare annual wellness visit, subsequent 07/24/2015   Cerebrovascular small vessel disease 04/24/2015   Benign paroxysmal positional vertigo 04/24/2015   Right medial knee pain 04/24/2015   Shoulder pain, right 11/22/2013   Edema 05/14/2012   Polyarthritis of ankle 04/08/2011   Hypothyroidism 12/18/2010   Low back pain radiating to both legs 12/18/2010   Chronic right hip pain 12/18/2010   Obesity 05/09/2010   CAD (coronary artery disease) 05/09/2010   Hyperlipidemia 05/09/2010    PCP: Sherlene Shams, MD  REFERRING PROVIDER: Lyndle Herrlich, MD  REFERRING DIAG: Trochanteric bursitis, left hip; pain in left hip  THERAPY DIAG:  Pain in left hip  Stiffness of left hip, not elsewhere classified  Gait difficulty  Muscle weakness (generalized)  Decreased activity tolerance  Status post total replacement of left hip  Rationale for Evaluation and Treatment: Rehabilitation  ONSET DATE: 09/07/2022  SUBJECTIVE:   SUBJECTIVE STATEMENT: Pt reports to PT with c/c of L lateral hip pain that start about 5 weeks ago. Pt had L hip replaced on 07/12/2022, which subsequently got infected on 08/01/2022. Pt reports insidious onset of most recent hip pain and describes it as tight/sharp. She had a cortisone shot and Prednisone from her doctor, most recently on 09/19/2022. She denies any numbness/tingling or clicking/popping.   PERTINENT HISTORY: Hx of bilateral THA (2017), R TKA (2021), CAD, HTN PAIN:  Are you having pain? Yes: NPRS scale: 0/10 now, 8-9/10 at worst Pain location: L lateral hip Pain description: Sharp, tight Aggravating factors: Walking Relieving factors: Rest, wine  PRECAUTIONS: None  RED FLAGS: None   WEIGHT BEARING RESTRICTIONS: No  FALLS:  Has patient fallen in last 6 months?  No falls but several near falls where pt describes losing her balance; she denies dizziness/lightheadedness  LIVING ENVIRONMENT: Lives with: lives with their  daughter Lives in: House/apartment Stairs: Yes: External: 3 steps; can reach both Has following equipment at home: Retail banker - 2 wheeled  OCCUPATION: Retired  PLOF: Independent with household mobility with device (quad cane 24/7)  PATIENT GOALS: Get back to walking without pain, get ready for cruise next fall  NEXT MD VISIT: 11/30/2022  OBJECTIVE:   DIAGNOSTIC FINDINGS: N/A  PATIENT SURVEYS:  FOTO 36/55  COGNITION: Overall cognitive status: Within functional limits for tasks assessed     SENSATION: WFL  MUSCLE LENGTH: Hamstrings: Right 150 deg; Left 150 deg  POSTURE: No Significant postural limitations  LEG LENGTH: 34 inches bilaterally for true leg length (ASIS to medial malleolus)  LOWER EXTREMITY ROM:  Active ROM Right eval Left eval  Hip flexion 115 100  Hip extension 12 7  Hip abduction 13 7  Hip adduction    Hip internal rotation 10 53  Hip external rotation 5 26  Knee flexion [facility] WFL  Knee extension [facility] WFL  Ankle dorsiflexion WFL WFL   (Blank rows = not tested)  LOWER EXTREMITY MMT:  MMT Right eval Left eval  Hip flexion 3+ 3 !  Hip extension 4 3+  Hip abduction 3+ 3+  Hip adduction    Hip internal rotation 3+ 3+  Hip external rotation 4- 3+  Knee flexion 4- 4-  Knee extension 4+ 4+  Ankle dorsiflexion    Ankle plantarflexion    Ankle inversion    Ankle eversion     (Blank rows = not tested)  LOWER EXTREMITY SPECIAL TESTS:  Hip special tests: Luisa Hart (FABER) test: positive on L and (FADIR) test: negative bilaterally  FUNCTIONAL TESTS:  5 times sit to stand: 20.2 seconds (pt required use of Ue's on knees to complete each rep)  GAIT: Distance walked: 20 feet in clinic Assistive device utilized: Quad cane small base Level of assistance: Complete Independence Comments: Antalgic gait, (+) trendelenburg noted on L side   TODAY'S TREATMENT:                                                                                                                               DATE: 10/31/2022  Subjective: Pt reports she still feels her walking is impaired secondary to L hip flexor pain/soreness as well as weakness; she feels the pain whenever she steps forward with her RLE . Pt reports 5-6/10 L hip pain right now.   There.ex:  Nustep L4 x 6 minutes with B UE/LE to improve cardiovascular  endurance.  Discussed weekend activities.    Supine LE stretching: hamstrings, piriformis, glutes, L hip flexor x 10 minutes - at start of session and 1-2 minutes at end  [facility] and trigger point release to L hip flexor x 5 minutes - added hip flexion movements to lengthen/shorten muscle with trigger point release  Hooklying bridge: 2x10 with adduction ball squeezes, 2x10 with clamshell  Resisted Gait with Single black TB: 5x each direction (fwd/back/lateral) - only forward, left, backwards today; deferred on last direction secondary to increase L hip flexor pain  Alternating foot taps onto 6-inch step: 2x15 each leg   Not today: High knees and sidesteps in // bars: 3 laps each, 2# AW  3-way hip with 2# AW: 2x8 each side SLR: 2x10 each leg 6"/12" step touches at stairs (B UE assist progressing to no UE assist)- SBA for safety.   Hallway walking with 2# AW:  (forward) with added alt. UE/LE touches.  Pt. Challenged with maintaining L knee/LE in midline.  Fatigue noted/ cuing to prevent hip IR.     PATIENT EDUCATION:  Education details: Pt educated on HEP form and frequency, role of PT going forward Person educated: Patient Education method: Explanation, Demonstration, and Handouts Education comprehension: verbalized understanding and returned demonstration  HOME EXERCISE PROGRAM: Access Code: [email or web address] URL: [email or web address] Date: 10/15/2022 Prepared by: Dorene Grebe Exercises - Supine Figure 4 Piriformis Stretch - 2 x daily - 7 x weekly - 2 sets - 3 reps - 30 seconds hold - Supine Straight Leg Raises - 2 x  daily - 4-5 x weekly - 2 sets - 10 reps - Seated Hamstring Stretch with Chair - 2 x daily - 7 x weekly - 2 sets - 3 reps - 30 second hold - Seated Hip Abduction - 1 x daily - 4-5 x weekly - 2 sets - 10 reps   ASSESSMENT:  CLINICAL IMPRESSION: Pt. presents to PT with reports of moderate pain and soreness in her L hip, but improved since last session. Pt. demonstrated a trigger point that was tender to palpation in her L hip flexor.  Manual stretching performed for generalized LE muscles as well as focused hip flexor stretching at start of session; pt unable to get her L thigh neutral secondary to pain initially.  After trigger point release and stretching of L hip flexor pt demonstrated improved range and was able to get the back of her thigh on the plinth; she reported improvement in pain as well. Strengthening exercises focused on improving pt's hip strength and stability. Pt educated to try trigger point release with L hip flexor stretching at home. Pt will benefit from continued PT services upon discharge to safely address deficits listed in patient problem list for decreased caregiver assistance and eventual return to PLOF.   OBJECTIVE IMPAIRMENTS: Abnormal gait, difficulty walking, decreased ROM, decreased strength, impaired flexibility, and pain.   ACTIVITY LIMITATIONS: bending, standing, squatting, and locomotion level  PARTICIPATION LIMITATIONS: cleaning, laundry, community activity, and yard work  PERSONAL FACTORS: Age, Time since onset of injury/illness/exacerbation, and 3+ comorbidities: Hx of bilateral THA, hx of R TKA, CAD, HTN  are also affecting patient's functional outcome.   REHAB POTENTIAL: Good  CLINICAL DECISION MAKING: Stable/uncomplicated  EVALUATION COMPLEXITY: Low   GOALS:  SHORT TERM GOALS: Target date: 11/12/2022 Pt will be independent and compliant with HEP in order to improve bilateral distal hamstring length by at least 10 degrees for decreased L hip  pain. Baseline: R/L 90-90 hamstring length = 150  degrees Goal status: INITIAL   LONG TERM GOALS: Target date: 12/03/2022  Pt will improve FOTO score to at least 55 in order to demonstrate subjective improvement in pain and ADL performance. Baseline: Initial 36 Goal status: INITIAL  2.  Pt will increase L hip AROM by at least 10 degrees in all planes to improve function with squatting/bending activities. Baseline: See above for initial ROM measurements  Goal status: INITIAL  3.  Pt will improve bilateral hip MMT scores to at least 4/5 in order to show improved hip musculature strength needed for improved walking/activity tolerance. Baseline: See above for initial MMT scores Goal status: INITIAL  4.  Pt will decrease 5TSTS by at least 3 seconds in order to demonstrate clinically significant improvement in LE strength. Baseline: 20.2 seconds with BUE support on knees Goal status: INITIAL  5.  Pt will report decrease of at least 3 points on NPS scale with walking in order to show improved walking tolerance. Baseline: 9/10 NPS scale with walking on initial evaluation Goal status: INITIAL  6.  Pt will demonstrate (-) FABER test on L side in order to show improvement in pain symptoms and return to PLOF. Baseline: (+) FABER on L Goal status: INITIAL   PLAN:  PT FREQUENCY: 2x/week  PT DURATION: 8 weeks  PLANNED INTERVENTIONS: Therapeutic exercises, Therapeutic activity, Neuromuscular re-education, Balance training, Self Care, Joint mobilization, Cryotherapy, Moist heat, Manual therapy, and Re-evaluation  PLAN FOR NEXT SESSION: Progress hip strengthening exercises (ankle weights, banded resistance, resisted gait), LE stretching (hamstrings, piriformis, trunk rotation), follow-up on TP release and L hip flexor stretching,  Cammie Mcgee, PT, DPT # [phone] Cena Benton, SPT 10/31/22, 9:52 AM

## 2022-11-05 ENCOUNTER — Encounter: Payer: Self-pay | Admitting: Physical Therapy

## 2022-11-05 ENCOUNTER — Ambulatory Visit: Payer: PPO | Admitting: Physical Therapy

## 2022-11-05 DIAGNOSIS — R6889 Other general symptoms and signs: Secondary | ICD-10-CM

## 2022-11-05 DIAGNOSIS — M6281 Muscle weakness (generalized): Secondary | ICD-10-CM

## 2022-11-05 DIAGNOSIS — M25552 Pain in left hip: Secondary | ICD-10-CM

## 2022-11-05 DIAGNOSIS — M25652 Stiffness of left hip, not elsewhere classified: Secondary | ICD-10-CM

## 2022-11-05 DIAGNOSIS — R269 Unspecified abnormalities of gait and mobility: Secondary | ICD-10-CM

## 2022-11-05 NOTE — Therapy (Signed)
OUTPATIENT PHYSICAL THERAPY LOWER EXTREMITY TREATMENT   Patient Name: Meagan Cox MRN: [id number] DOB:Sep 07, 1940, 82 y.o., female Today's Date: 11/05/2022  END OF SESSION:  PT End of Session - 11/05/22 0949     Visit Number 7    Number of Visits 16    Date for PT Re-Evaluation 12/10/22    PT Start Time 0945    PT Stop Time 1028    PT Time Calculation (min) 43 min    Activity Tolerance Patient tolerated treatment well    Behavior During Therapy WFL for tasks assessed/performed              Past Medical History:  Diagnosis Date   Hyperlipidemia    Hypertension    Hypothyroidism    Past Surgical History:  Procedure Laterality Date   ABDOMINAL HYSTERECTOMY     CARDIAC CATHETERIZATION     30 years ago, [place]   CHOLECYSTECTOMY N/A 08/21/2015   Procedure: LAPAROSCOPIC CHOLECYSTECTOMY;  Surgeon: Leafy Ro, MD;  Location: [facility];  Service: General;  Laterality: N/A;   EYE SURGERY     eye lid lift   KNEE ARTHROSCOPY Right 08/28/2018   Procedure: ARTHROSCOPY KNEE;  Surgeon: Lyndle Herrlich, MD;  Location: [facility];  Service: Orthopedics;  Laterality: Right;   PARTIAL HYSTERECTOMY     REPLACEMENT TOTAL KNEE Right 2021   Bowers   right hip replacement Right 2017   Bowers   TONSILLECTOMY     Patient Active Problem List   Diagnosis Date Noted   Encounter for preventive care 12/30/2021   Asymptomatic postmenopausal estrogen deficiency 12/28/2021   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   Aortic atherosclerosis (HCC) 11/05/2020   Postsurgical menopause 06/09/2020   Breast cancer screening 05/04/2020   Travel advice encounter 10/19/2017   Loss of perception for taste 10/19/2017   Vertigo 10/19/2017   Essential hypertension 03/05/2017   S/P laparoscopic cholecystectomy 03/05/2017   Ptosis, both eyelids 01/04/2017   Complete tear, knee, anterior cruciate ligament 08/28/2016   History of total hip arthroplasty 08/28/2016   Tear of meniscus of knee  08/28/2016   Medicare annual wellness visit, subsequent 07/24/2015   Cerebrovascular small vessel disease 04/24/2015   Benign paroxysmal positional vertigo 04/24/2015   Right medial knee pain 04/24/2015   Shoulder pain, right 11/22/2013   Edema 05/14/2012   Polyarthritis of ankle 04/08/2011   Hypothyroidism 12/18/2010   Low back pain radiating to both legs 12/18/2010   Chronic right hip pain 12/18/2010   Obesity 05/09/2010   CAD (coronary artery disease) 05/09/2010   Hyperlipidemia 05/09/2010    PCP: Sherlene Shams, MD  REFERRING PROVIDER: Lyndle Herrlich, MD  REFERRING DIAG: Trochanteric bursitis, left hip; pain in left hip  THERAPY DIAG:  Pain in left hip  Stiffness of left hip, not elsewhere classified  Gait difficulty  Muscle weakness (generalized)  Decreased activity tolerance  Rationale for Evaluation and Treatment: Rehabilitation  ONSET DATE: 09/07/2022  SUBJECTIVE:   SUBJECTIVE STATEMENT: Pt reports to PT with c/c of L lateral hip pain that start about 5 weeks ago. Pt had L hip replaced on 07/12/2022, which subsequently got infected on 08/01/2022. Pt reports insidious onset of most recent hip pain and describes it as tight/sharp. She had a cortisone shot and Prednisone from her doctor, most recently on 09/19/2022. She denies any numbness/tingling or clicking/popping.   PERTINENT HISTORY: Hx of bilateral THA (2017), R TKA (2021), CAD, HTN PAIN:  Are you having pain? Yes: NPRS scale: 0/10  now, 8-9/10 at worst Pain location: L lateral hip Pain description: Sharp, tight Aggravating factors: Walking Relieving factors: Rest, wine  PRECAUTIONS: None  RED FLAGS: None   WEIGHT BEARING RESTRICTIONS: No  FALLS:  Has patient fallen in last 6 months?  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: Retail banker - 2 wheeled  OCCUPATION: Retired  PLOF: Independent with household mobility with device (quad cane 24/7)  PATIENT GOALS: Get back to walking without pain, get ready for cruise next fall  NEXT MD VISIT: 11/30/2022  OBJECTIVE:   DIAGNOSTIC FINDINGS: N/A  PATIENT SURVEYS:  FOTO 36/55  COGNITION: Overall cognitive status: Within functional limits for tasks assessed     SENSATION: WFL  MUSCLE LENGTH: Hamstrings: Right 150 deg; Left 150 deg  POSTURE: No Significant postural limitations  LEG LENGTH: 34 inches bilaterally for true leg length (ASIS to medial malleolus)  LOWER EXTREMITY ROM:  Active ROM Right eval Left eval  Hip flexion 115 100  Hip extension 12 7  Hip abduction 13 7  Hip adduction    Hip internal rotation 10 53  Hip external rotation 5 26  Knee flexion [facility] WFL  Knee extension [facility] WFL  Ankle dorsiflexion WFL WFL   (Blank rows = not tested)  LOWER EXTREMITY MMT:  MMT Right eval Left eval  Hip flexion 3+ 3 !  Hip extension 4 3+  Hip abduction 3+ 3+  Hip adduction    Hip internal rotation 3+ 3+  Hip external rotation 4- 3+  Knee flexion 4- 4-  Knee extension 4+ 4+  Ankle dorsiflexion    Ankle plantarflexion    Ankle inversion    Ankle eversion     (Blank rows = not tested)  LOWER EXTREMITY SPECIAL TESTS:  Hip special tests: Luisa Hart (FABER) test: positive on L and (FADIR) test: negative bilaterally  FUNCTIONAL TESTS:  5 times sit to stand: 20.2 seconds (pt required use of Ue's on knees to complete each rep)  GAIT: Distance walked: 20 feet in clinic Assistive device utilized: Quad cane small base Level of assistance: Complete Independence Comments: Antalgic gait, (+) trendelenburg noted on L side   TODAY'S TREATMENT:                                                                                                                               DATE: 11/05/2022  Subjective: Pt reports improvement in L hip pain since last session, 6/10 right now. She feels improvement in her L hip flexor pain after last session. She says she changed shoes over the weekend and feels her balance and foot pain has improved since.   There.ex:  Nustep L4 x 7 minutes with B UE/LE to improve cardiovascular endurance.  Discussed weekend  activities.    Supine LE stretching: hamstrings, piriformis, glutes, L hip flexor x 5 minutes - at start of session; pt continues to demonstrate increased bilateral tightness with all stretches but particularly hamstrings, she can only tolerate ~50-60 degrees of hip flexion in SLR position secondary to hamstring tightness  STM and trigger point release to L hip flexor x 5 minutes - added hip flexion movements to lengthen/shorten muscle with trigger point release  High knees and sidesteps in // bars: 3 laps each, Green TB around knees   Resisted Gait with Single black TB: 5x each direction (fwd/back/lateral) - increased LE fatigue at end of exercise  Alternating foot taps onto 6-inch step: 2x10 each leg   Not today: Hooklying bridge: 2x10 with adduction ball squeezes, 2x10 with clamshell 3-way hip with green TB around knees: 2x8 each side SLR: 2x10 each leg 6"/12" step touches at stairs (B UE assist progressing to no UE assist)- SBA for safety.   Hallway walking with 2# AW:  (forward) with added alt. UE/LE touches.  Pt. Challenged with maintaining L knee/LE in midline.  Fatigue noted/ cuing to prevent hip IR.     PATIENT EDUCATION:  Education details: Pt educated on HEP form and frequency, role of PT going forward Person educated: Patient Education method: Explanation, Demonstration, and Handouts Education comprehension: verbalized understanding and returned demonstration  HOME EXERCISE PROGRAM: Access Code: [email or web address] URL: [email or web address] Date: 10/15/2022 Prepared by: Dorene Grebe Exercises -  Supine Figure 4 Piriformis Stretch - 2 x daily - 7 x weekly - 2 sets - 3 reps - 30 seconds hold - Supine Straight Leg Raises - 2 x daily - 4-5 x weekly - 2 sets - 10 reps - Seated Hamstring Stretch with Chair - 2 x daily - 7 x weekly - 2 sets - 3 reps - 30 second hold - Seated Hip Abduction - 1 x daily - 4-5 x weekly - 2 sets - 10 reps   ASSESSMENT:  CLINICAL IMPRESSION: Pt. presents to PT with reports of moderate pain and soreness in her L hip, but improved since last session. Pt's L hip flexor trigger point was still present although she reported improvement in walking over the weekend after last session secondary to decreased pain with weightbearing on that side. Manual stretching and trigger point release performed to pt's L hip flexor at start of session to continue to improve flexibility and extensibility. Pt continues to struggle with SLR on L side secondary to hip flexor pain. The rest of the session consisted of exercises aimed to improve pt's hip and quad strength and improve her walking endurance. Pt switched shoes over the weekend and reports subjective improvement in her balance and gait. Pt still demonstrates antalgic gait on her left side secondary to hip flexor pain, however has good heelstrike bilaterally. Fleetfeet suggested to pt to allow them to assess her feet biomechanics with weightbearing and ambulation in order to find  a more appropriate/comfortable shoe. Pt will benefit from continued PT services upon discharge to safely address deficits listed in patient problem list for decreased caregiver assistance and eventual return to PLOF.   OBJECTIVE IMPAIRMENTS: Abnormal gait, difficulty walking, decreased ROM, decreased strength, impaired flexibility, and pain.   ACTIVITY LIMITATIONS: bending, standing, squatting, and locomotion level  PARTICIPATION LIMITATIONS: cleaning, laundry, community activity, and yard work  PERSONAL FACTORS: Age, Time since onset of  injury/illness/exacerbation, and 3+ comorbidities: Hx of bilateral THA, hx of R TKA, CAD, HTN  are also affecting patient's functional outcome.  REHAB POTENTIAL: Good  CLINICAL DECISION MAKING: Stable/uncomplicated  EVALUATION COMPLEXITY: Low   GOALS:  SHORT TERM GOALS: Target date: 11/12/2022 Pt will be independent and compliant with HEP in order to improve bilateral distal hamstring length by at least 10 degrees for decreased L hip pain. Baseline: R/L 90-90 hamstring length = 150 degrees Goal status: INITIAL   LONG TERM GOALS: Target date: 12/03/2022  Pt will improve FOTO score to at least 55 in order to demonstrate subjective improvement in pain and ADL performance. Baseline: Initial 36 Goal status: INITIAL  2.  Pt will increase L hip AROM by at least 10 degrees in all planes to improve function with squatting/bending activities. Baseline: See above for initial ROM measurements  Goal status: INITIAL  3.  Pt will improve bilateral hip MMT scores to at least 4/5 in order to show improved hip musculature strength needed for improved walking/activity tolerance. Baseline: See above for initial MMT scores Goal status: INITIAL  4.  Pt will decrease 5TSTS by at least 3 seconds in order to demonstrate clinically significant improvement in LE strength. Baseline: 20.2 seconds with BUE support on knees Goal status: INITIAL  5.  Pt will report decrease of at least 3 points on NPS scale with walking in order to show improved walking tolerance. Baseline: 9/10 NPS scale with walking on initial evaluation Goal status: INITIAL  6.  Pt will demonstrate (-) FABER test on L side in order to show improvement in pain symptoms and return to PLOF. Baseline: (+) FABER on L Goal status: INITIAL   PLAN:  PT FREQUENCY: 2x/week  PT DURATION: 8 weeks  PLANNED INTERVENTIONS: Therapeutic exercises, Therapeutic activity, Neuromuscular re-education, Balance training, Self Care, Joint mobilization,  Cryotherapy, Moist heat, Manual therapy, and Re-evaluation  PLAN FOR NEXT SESSION: Progress hip strengthening exercises (ankle weights, banded resistance, resisted gait), LE stretching (hamstrings, piriformis, trunk rotation), follow-up on TP release and L hip flexor stretching, follow-up on fleetfeet   Cammie Mcgee, PT, DPT # [phone] Cena Benton, SPT 11/05/22, 11:04 AM

## 2022-11-07 ENCOUNTER — Other Ambulatory Visit: Payer: Self-pay | Admitting: Internal Medicine

## 2022-11-07 ENCOUNTER — Ambulatory Visit: Payer: PPO | Admitting: Physical Therapy

## 2022-11-07 ENCOUNTER — Encounter: Payer: Self-pay | Admitting: Physical Therapy

## 2022-11-07 DIAGNOSIS — M25652 Stiffness of left hip, not elsewhere classified: Secondary | ICD-10-CM

## 2022-11-07 DIAGNOSIS — M6281 Muscle weakness (generalized): Secondary | ICD-10-CM

## 2022-11-07 DIAGNOSIS — M25552 Pain in left hip: Secondary | ICD-10-CM

## 2022-11-07 DIAGNOSIS — R6889 Other general symptoms and signs: Secondary | ICD-10-CM

## 2022-11-07 DIAGNOSIS — R269 Unspecified abnormalities of gait and mobility: Secondary | ICD-10-CM

## 2022-11-07 NOTE — Therapy (Unsigned)
OUTPATIENT PHYSICAL THERAPY LOWER EXTREMITY TREATMENT   Patient Name: Meagan Cox MRN: [id number] DOB:1940/08/29, 82 y.o., female Today's Date: 11/07/2022  END OF SESSION:  PT End of Session - 11/07/22 1114     Visit Number 8    Number of Visits 16    Date for PT Re-Evaluation 12/10/22    PT Start Time 1115    PT Stop Time 1155    PT Time Calculation (min) 40 min    Activity Tolerance Patient tolerated treatment well    Behavior During Therapy WFL for tasks assessed/performed               Past Medical History:  Diagnosis Date   Hyperlipidemia    Hypertension    Hypothyroidism    Past Surgical History:  Procedure Laterality Date   ABDOMINAL HYSTERECTOMY     CARDIAC CATHETERIZATION     30 years ago, [place]   CHOLECYSTECTOMY N/A 08/21/2015   Procedure: LAPAROSCOPIC CHOLECYSTECTOMY;  Surgeon: Leafy Ro, MD;  Location: [facility];  Service: General;  Laterality: N/A;   EYE SURGERY     eye lid lift   KNEE ARTHROSCOPY Right 08/28/2018   Procedure: ARTHROSCOPY KNEE;  Surgeon: Lyndle Herrlich, MD;  Location: [facility];  Service: Orthopedics;  Laterality: Right;   PARTIAL HYSTERECTOMY     REPLACEMENT TOTAL KNEE Right 2021   [facility]ers   right hip replacement Right 2017   [facility]   TONSILLECTOMY     Patient Active Problem List   Diagnosis Date Noted   Encounter for preventive care 12/30/2021   Asymptomatic postmenopausal estrogen deficiency 12/28/2021   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   Aortic atherosclerosis (HCC) 11/05/2020   Postsurgical menopause 06/09/2020   Breast cancer screening 05/04/2020   Travel advice encounter 10/19/2017   Loss of perception for taste 10/19/2017   Vertigo 10/19/2017   Essential hypertension 03/05/2017   S/P laparoscopic cholecystectomy 03/05/2017   Ptosis, both eyelids 01/04/2017   Complete tear, knee, anterior cruciate ligament 08/28/2016   History of total hip arthroplasty 08/28/2016   Tear of meniscus of knee  08/28/2016   Medicare annual wellness visit, subsequent 07/24/2015   Cerebrovascular small vessel disease 04/24/2015   Benign paroxysmal positional vertigo 04/24/2015   Right medial knee pain 04/24/2015   Shoulder pain, right 11/22/2013   Edema 05/14/2012   Polyarthritis of ankle 04/08/2011   Hypothyroidism 12/18/2010   Low back pain radiating to both legs 12/18/2010   Chronic right hip pain 12/18/2010   Obesity 05/09/2010   CAD (coronary artery disease) 05/09/2010   Hyperlipidemia 05/09/2010    PCP: Sherlene Shams, MD  REFERRING PROVIDER: Lyndle Herrlich, MD  REFERRING DIAG: Trochanteric bursitis, left hip; pain in left hip  THERAPY DIAG:  Pain in left hip  Stiffness of left hip, not elsewhere classified  Gait difficulty  Muscle weakness (generalized)  Decreased activity tolerance  Rationale for Evaluation and Treatment: Rehabilitation  ONSET DATE: 09/07/2022  SUBJECTIVE:   SUBJECTIVE STATEMENT: Pt reports to PT with c/c of L lateral hip pain that start about 5 weeks ago. Pt had L hip replaced on 07/12/2022, which subsequently got infected on 08/01/2022. Pt reports insidious onset of most recent hip pain and describes it as tight/sharp. She had a cortisone shot and Prednisone from her doctor, most recently on 09/19/2022. She denies any numbness/tingling or clicking/popping.   PERTINENT HISTORY: Hx of bilateral THA (2017), R TKA (2021), CAD, HTN PAIN:  Are you having pain? Yes: NPRS scale:  0/10 now, 8-9/10 at worst Pain location: L lateral hip Pain description: Sharp, tight Aggravating factors: Walking Relieving factors: Rest, wine  PRECAUTIONS: None  RED FLAGS: None   WEIGHT BEARING RESTRICTIONS: No  FALLS:  Has patient fallen in last 6 months?  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: Retail banker - 2 wheeled  OCCUPATION: Retired  PLOF: Independent with household mobility with device (quad cane 24/7)  PATIENT GOALS: Get back to walking without pain, get ready for cruise next fall  NEXT MD VISIT: 11/30/2022  OBJECTIVE:   DIAGNOSTIC FINDINGS: N/A  PATIENT SURVEYS:  FOTO 36/55  COGNITION: Overall cognitive status: Within functional limits for tasks assessed     SENSATION: WFL  MUSCLE LENGTH: Hamstrings: Right 150 deg; Left 150 deg  POSTURE: No Significant postural limitations  LEG LENGTH: 34 inches bilaterally for true leg length (ASIS to medial malleolus)  LOWER EXTREMITY ROM:  Active ROM Right eval Left eval  Hip flexion 115 100  Hip extension 12 7  Hip abduction 13 7  Hip adduction    Hip internal rotation 10 53  Hip external rotation 5 26  Knee flexion [facility] WFL  Knee extension [facility] WFL  Ankle dorsiflexion WFL WFL   (Blank rows = not tested)  LOWER EXTREMITY MMT:  MMT Right eval Left eval  Hip flexion 3+ 3 !  Hip extension 4 3+  Hip abduction 3+ 3+  Hip adduction    Hip internal rotation 3+ 3+  Hip external rotation 4- 3+  Knee flexion 4- 4-  Knee extension 4+ 4+  Ankle dorsiflexion    Ankle plantarflexion    Ankle inversion    Ankle eversion     (Blank rows = not tested)  LOWER EXTREMITY SPECIAL TESTS:  Hip special tests: Luisa Hart (FABER) test: positive on L and (FADIR) test: negative bilaterally  FUNCTIONAL TESTS:  5 times sit to stand: 20.2 seconds (pt required use of Ue's on knees to complete each rep)  GAIT: Distance walked: 20 feet in clinic Assistive device utilized: Quad cane small base Level of assistance: Complete Independence Comments: Antalgic gait, (+) trendelenburg noted on L side   TODAY'S TREATMENT:                                                                                                                               DATE: 11/07/2022  Subjective: Pt reports to PT with feelings of tenderness in her L hip flexor, primarily with standing and weightbearing through her leg. She feels mild improvement in her L hip flexor pain after last session.  There.ex:  Nustep L4 x 7 minutes with B UE/LE to improve cardiovascular endurance.  Discussed weekend activities.    Supine LE stretching: hamstrings, piriformis,  glutes, L hip flexor x 5 minutes - at start of session; pt continues to demonstrate increased bilateral tightness with all stretches but particularly hamstrings, she can only tolerate ~50-60 degrees of hip flexion in SLR position secondary to hamstring tightness  STM and trigger point release to L hip flexor x 5 minutes - added hip flexion movements to lengthen/shorten muscle with trigger point release  Hooklying bridge: 2x10 with adduction ball squeezes, 2x10 with clamshell (green TB)  Alternating foot taps onto 6-inch step: 2x10 each leg, 3# AW, no UE support  Standing Hip abduction with BUE support, 2x10 each leg   Not today: High knees and sidesteps in // bars: 3 laps each, Green TB around knees  Resisted Gait with Single black TB: 5x each direction (fwd/back/lateral) - increased LE fatigue at end of exercise 3-way hip with green TB around knees: 2x8 each side SLR: 2x10 each leg 6"/12" step touches at stairs (B UE assist progressing to no UE assist)- SBA for safety.   Hallway walking with 2# AW:  (forward) with added alt. UE/LE touches.  Pt. Challenged with maintaining L knee/LE in midline.  Fatigue noted/ cuing to prevent hip IR.     PATIENT EDUCATION:  Education details: Pt educated on HEP form and frequency, role of PT going forward Person educated: Patient Education method: Explanation, Demonstration, and Handouts Education comprehension: verbalized understanding and returned demonstration  HOME EXERCISE PROGRAM: Access Code: [email or web address] URL: [email or web address] Date:  10/15/2022 Prepared by: Dorene Grebe Exercises - Supine Figure 4 Piriformis Stretch - 2 x daily - 7 x weekly - 2 sets - 3 reps - 30 seconds hold - Supine Straight Leg Raises - 2 x daily - 4-5 x weekly - 2 sets - 10 reps - Seated Hamstring Stretch with Chair - 2 x daily - 7 x weekly - 2 sets - 3 reps - 30 second hold - Seated Hip Abduction - 1 x daily - 4-5 x weekly - 2 sets - 10 reps   ASSESSMENT:  CLINICAL IMPRESSION: Pt. presents to PT with reports of continued L hip flexor pain primarily with weightbearing. Manual stretching and trigger point release performed at start of session again to improve pt's LE flexibility and extensibility of her L hip flexor muscle. Pt continues to demonstrate significant tightness in bilateral hamstrings. The rest of the session consisted of exercises to improve pt's hip strength, particularly her hip abductors. Pt continues to demonstrate weakness in both hip abductors with her gait and weightbearing, pt requires cues throughout standing/walking exercises to prevent trendelenburg. Pt educated to continue with stretches and HEP at home while keeping them all pain-free. Pt will benefit from continued PT services upon discharge to safely address deficits listed in patient problem list for decreased caregiver assistance and eventual return to PLOF.   OBJECTIVE IMPAIRMENTS: Abnormal gait, difficulty walking, decreased ROM, decreased strength, impaired flexibility, and pain.   ACTIVITY LIMITATIONS: bending, standing, squatting, and locomotion level  PARTICIPATION LIMITATIONS: cleaning, laundry, community activity, and yard work  PERSONAL FACTORS: Age, Time since onset of injury/illness/exacerbation, and 3+ comorbidities: Hx of bilateral THA, hx of R TKA, CAD, HTN  are also affecting patient's functional outcome.   REHAB POTENTIAL: Good  CLINICAL DECISION MAKING: Stable/uncomplicated  EVALUATION COMPLEXITY: Low   GOALS:  SHORT TERM GOALS: Target date:  11/12/2022 Pt will be independent and compliant with HEP in order to improve bilateral distal hamstring length by at least 10 degrees for decreased L hip pain. Baseline: R/L 90-90 hamstring length =  150 degrees Goal status: INITIAL   LONG TERM GOALS: Target date: 12/03/2022  Pt will improve FOTO score to at least 55 in order to demonstrate subjective improvement in pain and ADL performance. Baseline: Initial 36 Goal status: INITIAL  2.  Pt will increase L hip AROM by at least 10 degrees in all planes to improve function with squatting/bending activities. Baseline: See above for initial ROM measurements  Goal status: INITIAL  3.  Pt will improve bilateral hip MMT scores to at least 4/5 in order to show improved hip musculature strength needed for improved walking/activity tolerance. Baseline: See above for initial MMT scores Goal status: INITIAL  4.  Pt will decrease 5TSTS by at least 3 seconds in order to demonstrate clinically significant improvement in LE strength. Baseline: 20.2 seconds with BUE support on knees Goal status: INITIAL  5.  Pt will report decrease of at least 3 points on NPS scale with walking in order to show improved walking tolerance. Baseline: 9/10 NPS scale with walking on initial evaluation Goal status: INITIAL  6.  Pt will demonstrate (-) FABER test on L side in order to show improvement in pain symptoms and return to PLOF. Baseline: (+) FABER on L Goal status: INITIAL   PLAN:  PT FREQUENCY: 2x/week  PT DURATION: 8 weeks  PLANNED INTERVENTIONS: Therapeutic exercises, Therapeutic activity, Neuromuscular re-education, Balance training, Self Care, Joint mobilization, Cryotherapy, Moist heat, Manual therapy, and Re-evaluation  PLAN FOR NEXT SESSION: Progress hip strengthening exercises (ankle weights, banded resistance, resisted gait), LE stretching (hamstrings, piriformis, trunk rotation), follow-up on TP release and L hip flexor stretching, follow-up on  fleetfeet  Cammie Mcgee, PT, DPT # [phone] Cena Benton, SPT 11/07/22, 12:10 PM

## 2022-11-12 ENCOUNTER — Ambulatory Visit: Payer: PPO | Admitting: Physical Therapy

## 2022-11-12 ENCOUNTER — Encounter: Payer: Self-pay | Admitting: Physical Therapy

## 2022-11-12 DIAGNOSIS — M25652 Stiffness of left hip, not elsewhere classified: Secondary | ICD-10-CM

## 2022-11-12 DIAGNOSIS — M6281 Muscle weakness (generalized): Secondary | ICD-10-CM

## 2022-11-12 DIAGNOSIS — R269 Unspecified abnormalities of gait and mobility: Secondary | ICD-10-CM

## 2022-11-12 DIAGNOSIS — M25552 Pain in left hip: Secondary | ICD-10-CM | POA: Diagnosis not present

## 2022-11-12 DIAGNOSIS — R6889 Other general symptoms and signs: Secondary | ICD-10-CM

## 2022-11-12 NOTE — Therapy (Signed)
OUTPATIENT PHYSICAL THERAPY LOWER EXTREMITY TREATMENT   Patient Name: Meagan Cox MRN: [id number] DOB:Feb 08, 1940, 82 y.o., female Today's Date: 11/12/2022  END OF SESSION:  PT End of Session - 11/12/22 0944     Visit Number 9    Number of Visits 16    Date for PT Re-Evaluation 12/10/22    PT Start Time 0945    PT Stop Time 1025    PT Time Calculation (min) 40 min    Activity Tolerance Patient tolerated treatment well    Behavior During Therapy [facility] for tasks assessed/performed              Past Medical History:  Diagnosis Date   Hyperlipidemia    Hypertension    Hypothyroidism    Past Surgical History:  Procedure Laterality Date   ABDOMINAL HYSTERECTOMY     CARDIAC CATHETERIZATION     30 years ago, [place]   CHOLECYSTECTOMY N/A 08/21/2015   Procedure: LAPAROSCOPIC CHOLECYSTECTOMY;  Surgeon: Leafy Ro, MD;  Location: [facility];  Service: General;  Laterality: N/A;   EYE SURGERY     eye lid lift   KNEE ARTHROSCOPY Right 08/28/2018   Procedure: ARTHROSCOPY KNEE;  Surgeon: Lyndle Herrlich, MD;  Location: [facility];  Service: Orthopedics;  Laterality: Right;   PARTIAL HYSTERECTOMY     REPLACEMENT TOTAL KNEE Right 2021   [facility]ers   right hip replacement Right 2017   [facility]   TONSILLECTOMY     Patient Active Problem List   Diagnosis Date Noted   Encounter for preventive care 12/30/2021   Asymptomatic postmenopausal estrogen deficiency 12/28/2021   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   Aortic atherosclerosis (HCC) 11/05/2020   Postsurgical menopause 06/09/2020   Breast cancer screening 05/04/2020   Travel advice encounter 10/19/2017   Loss of perception for taste 10/19/2017   Vertigo 10/19/2017   Essential hypertension 03/05/2017   S/P laparoscopic cholecystectomy 03/05/2017   Ptosis, both eyelids 01/04/2017   Complete tear, knee, anterior cruciate ligament 08/28/2016   History of total hip arthroplasty 08/28/2016   Tear of meniscus of knee  08/28/2016   Medicare annual wellness visit, subsequent 07/24/2015   Cerebrovascular small vessel disease 04/24/2015   Benign paroxysmal positional vertigo 04/24/2015   Right medial knee pain 04/24/2015   Shoulder pain, right 11/22/2013   Edema 05/14/2012   Polyarthritis of ankle 04/08/2011   Hypothyroidism 12/18/2010   Low back pain radiating to both legs 12/18/2010   Chronic right hip pain 12/18/2010   Obesity 05/09/2010   CAD (coronary artery disease) 05/09/2010   Hyperlipidemia 05/09/2010    PCP: Sherlene Shams, MD  REFERRING PROVIDER: Lyndle Herrlich, MD  REFERRING DIAG: Trochanteric bursitis, left hip; pain in left hip  THERAPY DIAG:  Pain in left hip  Stiffness of left hip, not elsewhere classified  Gait difficulty  Muscle weakness (generalized)  Decreased activity tolerance  Rationale for Evaluation and Treatment: Rehabilitation  ONSET DATE: 09/07/2022  SUBJECTIVE:   SUBJECTIVE STATEMENT: Pt reports to PT with c/c of L lateral hip pain that start about 5 weeks ago. Pt had L hip replaced on 07/12/2022, which subsequently got infected on 08/01/2022. Pt reports insidious onset of most recent hip pain and describes it as tight/sharp. She had a cortisone shot and Prednisone from her doctor, most recently on 09/19/2022. She denies any numbness/tingling or clicking/popping.   PERTINENT HISTORY: Hx of bilateral THA (2017), R TKA (2021), CAD, HTN PAIN:  Are you having pain? Yes: NPRS scale: 0/10  now, 8-9/10 at worst Pain location: L lateral hip Pain description: Sharp, tight Aggravating factors: Walking Relieving factors: Rest, wine  PRECAUTIONS: None  RED FLAGS: None   WEIGHT BEARING RESTRICTIONS: No  FALLS:  Has patient fallen in last 6 months?  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: Retail banker - 2 wheeled  OCCUPATION: Retired  PLOF: Independent with household mobility with device (quad cane 24/7)  PATIENT GOALS: Get back to walking without pain, get ready for cruise next fall  NEXT MD VISIT: 11/30/2022  OBJECTIVE:   DIAGNOSTIC FINDINGS: N/A  PATIENT SURVEYS:  FOTO 36/55  COGNITION: Overall cognitive status: Within functional limits for tasks assessed     SENSATION: WFL  MUSCLE LENGTH: Hamstrings: Right 150 deg; Left 150 deg  POSTURE: No Significant postural limitations  LEG LENGTH: 34 inches bilaterally for true leg length (ASIS to medial malleolus)  LOWER EXTREMITY ROM:  Active ROM Right eval Left eval  Hip flexion 115 100  Hip extension 12 7  Hip abduction 13 7  Hip adduction    Hip internal rotation 10 53  Hip external rotation 5 26  Knee flexion [facility] WFL  Knee extension [facility] WFL  Ankle dorsiflexion WFL WFL   (Blank rows = not tested)  LOWER EXTREMITY MMT:  MMT Right eval Left eval  Hip flexion 3+ 3 !  Hip extension 4 3+  Hip abduction 3+ 3+  Hip adduction    Hip internal rotation 3+ 3+  Hip external rotation 4- 3+  Knee flexion 4- 4-  Knee extension 4+ 4+  Ankle dorsiflexion    Ankle plantarflexion    Ankle inversion    Ankle eversion     (Blank rows = not tested)  LOWER EXTREMITY SPECIAL TESTS:  Hip special tests: Luisa Hart (FABER) test: positive on L and (FADIR) test: negative bilaterally  FUNCTIONAL TESTS:  5 times sit to stand: 20.2 seconds (pt required use of Ue's on knees to complete each rep)  GAIT: Distance walked: 20 feet in clinic Assistive device utilized: Quad cane small base Level of assistance: Complete Independence Comments: Antalgic gait, (+) trendelenburg noted on L side   TODAY'S TREATMENT:                                                                                                                               DATE: 11/12/2022  Subjective: Pt reports improvement in L hip pain and stiffness over the weekend; she still has a "knot" in her L hip flexor that is bothering her. Pt reports 6/10 pain in her L hip when walking because of the muscle knot.   There.ex:  Nustep L4 x 7 minutes with B UE/LE to improve cardiovascular endurance.  Discussed weekend activities.  Supine LE stretching: hamstrings, piriformis, glutes, L hip flexor x 5 minutes - at start of session; pt continues to demonstrate tightness in bilateral hamstrings (R>L)  STM and trigger point release to L hip flexor x 5 minutes - added hip flexion movements to lengthen/shorten muscle with trigger point release  Resisted Gait with Single black TB: 5x each direction (fwd/back/lateral) - increased LE fatigue at end of exercise  Hooklying bridge: 2x10 with adduction ball squeezes  Alternating foot taps onto 6-inch step: 1x15 each leg, no UE support  Standing Hip abduction with BUE support, 2x10 each leg  Supine SLR: with 3# AW for as many reps as possible each leg (10 R, 6 L), then finishing out the set of 15 bodyweight - increased L hip pain with weight   Not today: High knees and sidesteps in // bars: 3 laps each, Green TB around knees  3-way hip with green TB around knees: 2x8 each side  6"/12" step touches at stairs (B UE assist progressing to no UE assist)- SBA for safety.   Hallway walking with 2# AW:  (forward) with added alt. UE/LE touches.  Pt. Challenged with maintaining L knee/LE in midline.  Fatigue noted/ cuing to prevent hip IR.     PATIENT EDUCATION:  Education details: Pt educated on HEP form and frequency, role of PT going forward Person educated: Patient Education method: Explanation, Demonstration, and Handouts Education comprehension: verbalized understanding and returned demonstration  HOME EXERCISE PROGRAM: Access Code: [email or web address] URL: [email or web address] Date: 10/15/2022 Prepared by: Dorene Grebe Exercises - Supine Figure 4 Piriformis Stretch - 2 x daily - 7 x weekly - 2 sets - 3 reps - 30 seconds hold - Supine Straight Leg Raises - 2 x daily - 4-5 x weekly - 2 sets - 10 reps - Seated Hamstring Stretch with Chair - 2 x daily - 7 x weekly - 2 sets - 3 reps - 30 second hold - Seated Hip Abduction - 1 x daily - 4-5 x weekly - 2 sets - 10 reps   ASSESSMENT:  CLINICAL IMPRESSION: Pt. presents to PT with reports of continued L hip flexor pain that she mainly feels when walking or standing on the L side. Manual stretching and trigger point release performed again at start of session to improve her general LE flexibility and particularly her L hip flexor extensibility. Pt continues to demonstrate a trigger point in her L hip flexor that is tender to palpation; extensibility does feel improved and pt educated to continue with stretching, STM, and applying heat to the area at home. Strengthening exercises focused on increase pt's hip strength. Pt continues to experience increased pain when actively engaging her L hip flexor, such as with a SLR or standing 6-inch step-tap. Pt continues to demonstrate decreased eccentric contorl during resisted gait in all directions; she is still unable to perform the exercise with both black bands. Pt will benefit from continued PT services upon discharge to safely address deficits listed in patient problem list for decreased caregiver assistance and eventual return to PLOF.   OBJECTIVE IMPAIRMENTS: Abnormal gait, difficulty walking, decreased ROM, decreased strength, impaired flexibility, and pain.   ACTIVITY LIMITATIONS: bending, standing, squatting, and locomotion level  PARTICIPATION LIMITATIONS: cleaning, laundry, community activity, and yard work  PERSONAL FACTORS: Age, Time since onset of injury/illness/exacerbation, and 3+ comorbidities: Hx of bilateral THA, hx of R TKA, CAD, HTN  are also affecting patient's functional outcome.   REHAB POTENTIAL:  Good  CLINICAL DECISION MAKING: Stable/uncomplicated  EVALUATION COMPLEXITY: Low   GOALS:  SHORT TERM GOALS: Target date: 11/12/2022 Pt will be independent and compliant with HEP in order to improve bilateral distal hamstring length by at least 10 degrees for decreased L hip pain. Baseline: R/L 90-90 hamstring length = 150 degrees Goal status: INITIAL   LONG TERM GOALS: Target date: 12/03/2022  Pt will improve FOTO score to at least 55 in order to demonstrate subjective improvement in pain and ADL performance. Baseline: Initial 36 Goal status: INITIAL  2.  Pt will increase L hip AROM by at least 10 degrees in all planes to improve function with squatting/bending activities. Baseline: See above for initial ROM measurements  Goal status: INITIAL  3.  Pt will improve bilateral hip MMT scores to at least 4/5 in order to show improved hip musculature strength needed for improved walking/activity tolerance. Baseline: See above for initial MMT scores Goal status: INITIAL  4.  Pt will decrease 5TSTS by at least 3 seconds in order to demonstrate clinically significant improvement in LE strength. Baseline: 20.2 seconds with BUE support on knees Goal status: INITIAL  5.  Pt will report decrease of at least 3 points on NPS scale with walking in order to show improved walking tolerance. Baseline: 9/10 NPS scale with walking on initial evaluation Goal status: INITIAL  6.  Pt will demonstrate (-) FABER test on L side in order to show improvement in pain symptoms and return to PLOF. Baseline: (+) FABER on L Goal status: INITIAL   PLAN:  PT FREQUENCY: 2x/week  PT DURATION: 8 weeks  PLANNED INTERVENTIONS: Therapeutic exercises, Therapeutic activity, Neuromuscular re-education, Balance training, Self Care, Joint mobilization, Cryotherapy, Moist heat, Manual therapy, and Re-evaluation  PLAN FOR NEXT SESSION: Progress hip strengthening exercises (ankle weights, banded resistance, resisted  gait), LE stretching and TP release (hamstrings, piriformis, trunk rotation, hip flexor (L)), PROGRESS NOTE (check STG/LTGs)  Cammie Mcgee, PT, DPT # [phone] Cena Benton, SPT 11/12/22, 10:37 AM

## 2022-11-14 ENCOUNTER — Encounter: Payer: Self-pay | Admitting: Physical Therapy

## 2022-11-14 ENCOUNTER — Ambulatory Visit: Payer: PPO | Admitting: Physical Therapy

## 2022-11-14 DIAGNOSIS — M25652 Stiffness of left hip, not elsewhere classified: Secondary | ICD-10-CM

## 2022-11-14 DIAGNOSIS — R6889 Other general symptoms and signs: Secondary | ICD-10-CM

## 2022-11-14 DIAGNOSIS — M25552 Pain in left hip: Secondary | ICD-10-CM | POA: Diagnosis not present

## 2022-11-14 DIAGNOSIS — M6281 Muscle weakness (generalized): Secondary | ICD-10-CM

## 2022-11-14 DIAGNOSIS — R269 Unspecified abnormalities of gait and mobility: Secondary | ICD-10-CM

## 2022-11-14 NOTE — Therapy (Signed)
OUTPATIENT PHYSICAL THERAPY LOWER EXTREMITY TREATMENT/PROGRESS NOTE 10/17/2022 - 11/14/2022  Patient Name: Meagan Cox MRN: [id number] DOB:17-Aug-1940, 82 y.o., female Today's Date: 11/14/2022  END OF SESSION:  PT End of Session - 11/14/22 0948     Visit Number 10    Number of Visits 16    Date for PT Re-Evaluation 12/10/22    PT Start Time 0945    PT Stop Time 1028    PT Time Calculation (min) 43 min    Activity Tolerance Patient tolerated treatment well    Behavior During Therapy WFL for tasks assessed/performed               Past Medical History:  Diagnosis Date   Hyperlipidemia    Hypertension    Hypothyroidism    Past Surgical History:  Procedure Laterality Date   ABDOMINAL HYSTERECTOMY     CARDIAC CATHETERIZATION     30 years ago, [place]   CHOLECYSTECTOMY N/A 08/21/2015   Procedure: LAPAROSCOPIC CHOLECYSTECTOMY;  Surgeon: Leafy Ro, MD;  Location: [facility];  Service: General;  Laterality: N/A;   EYE SURGERY     eye lid lift   KNEE ARTHROSCOPY Right 08/28/2018   Procedure: ARTHROSCOPY KNEE;  Surgeon: Lyndle Herrlich, MD;  Location: [facility];  Service: Orthopedics;  Laterality: Right;   PARTIAL HYSTERECTOMY     REPLACEMENT TOTAL KNEE Right 2021   [facility]   right hip replacement Right 2017   [facility]   TONSILLECTOMY     Patient Active Problem List   Diagnosis Date Noted   Encounter for preventive care 12/30/2021   Asymptomatic postmenopausal estrogen deficiency 12/28/2021   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   Aortic atherosclerosis (HCC) 11/05/2020   Postsurgical menopause 06/09/2020   Breast cancer screening 05/04/2020   Travel advice encounter 10/19/2017   Loss of perception for taste 10/19/2017   Vertigo 10/19/2017   Essential hypertension 03/05/2017   S/P laparoscopic cholecystectomy 03/05/2017   Ptosis, both eyelids 01/04/2017   Complete tear, knee, anterior cruciate ligament 08/28/2016   History of total hip arthroplasty  08/28/2016   Tear of meniscus of knee 08/28/2016   Medicare annual wellness visit, subsequent 07/24/2015   Cerebrovascular small vessel disease 04/24/2015   Benign paroxysmal positional vertigo 04/24/2015   Right medial knee pain 04/24/2015   Shoulder pain, right 11/22/2013   Edema 05/14/2012   Polyarthritis of ankle 04/08/2011   Hypothyroidism 12/18/2010   Low back pain radiating to both legs 12/18/2010   Chronic right hip pain 12/18/2010   Obesity 05/09/2010   CAD (coronary artery disease) 05/09/2010   Hyperlipidemia 05/09/2010    PCP: Sherlene Shams, MD  REFERRING PROVIDER: Lyndle Herrlich, MD  REFERRING DIAG: Trochanteric bursitis, left hip; pain in left hip  THERAPY DIAG:  Pain in left hip  Stiffness of left hip, not elsewhere classified  Gait difficulty  Muscle weakness (generalized)  Decreased activity tolerance  Rationale for Evaluation and Treatment: Rehabilitation  ONSET DATE: 09/07/2022  SUBJECTIVE:   SUBJECTIVE STATEMENT: Pt reports to PT with c/c of L lateral hip pain that start about 5 weeks ago. Pt had L hip replaced on 07/12/2022, which subsequently got infected on 08/01/2022. Pt reports insidious onset of most recent hip pain and describes it as tight/sharp. She had a cortisone shot and Prednisone from her doctor, most recently on 09/19/2022. She denies any numbness/tingling or clicking/popping.   PERTINENT HISTORY: Hx of bilateral THA (2017), R TKA (2021), CAD, HTN PAIN:  Are you having pain?  Yes: NPRS scale: 0/10 now, 8-9/10 at worst Pain location: L lateral hip Pain description: Sharp, tight Aggravating factors: Walking Relieving factors: Rest, wine  PRECAUTIONS: None  RED FLAGS: None   WEIGHT BEARING RESTRICTIONS: No  FALLS:  Has patient fallen in last 6 months?  No falls but several near falls where pt describes losing her balance; she denies dizziness/lightheadedness  LIVING ENVIRONMENT: Lives with: lives with their daughter Lives  in: House/apartment Stairs: Yes: External: 3 steps; can reach both Has following equipment at home: Retail banker - 2 wheeled  OCCUPATION: Retired  PLOF: Independent with household mobility with device (quad cane 24/7)  PATIENT GOALS: Get back to walking without pain, get ready for cruise next fall  NEXT MD VISIT: 11/30/2022  OBJECTIVE:   DIAGNOSTIC FINDINGS: N/A  PATIENT SURVEYS:  FOTO 36/55  COGNITION: Overall cognitive status: Within functional limits for tasks assessed     SENSATION: WFL  MUSCLE LENGTH: Hamstrings: Right 150 deg; Left 150 deg  POSTURE: No Significant postural limitations  LEG LENGTH: 34 inches bilaterally for true leg length (ASIS to medial malleolus)  LOWER EXTREMITY ROM:  Active ROM Right eval Left eval  Hip flexion 115 100  Hip extension 12 7  Hip abduction 13 7  Hip adduction    Hip internal rotation 10 53  Hip external rotation 5 26  Knee flexion [facility] WFL  Knee extension [facility] WFL  Ankle dorsiflexion WFL WFL   (Blank rows = not tested)  LOWER EXTREMITY MMT:  MMT Right eval Left eval  Hip flexion 3+ 3 !  Hip extension 4 3+  Hip abduction 3+ 3+  Hip adduction    Hip internal rotation 3+ 3+  Hip external rotation 4- 3+  Knee flexion 4- 4-  Knee extension 4+ 4+  Ankle dorsiflexion    Ankle plantarflexion    Ankle inversion    Ankle eversion     (Blank rows = not tested)  LOWER EXTREMITY SPECIAL TESTS:  Hip special tests: Luisa Hart (FABER) test: positive on L and (FADIR) test: negative bilaterally  FUNCTIONAL TESTS:  5 times sit to stand: 20.2 seconds (pt required use of Ue's on knees to complete each rep)  GAIT: Distance walked: 20 feet in clinic Assistive device utilized: Quad cane small base Level of assistance: Complete Independence Comments: Antalgic gait, (+) trendelenburg noted on L side   TODAY'S TREATMENT:                                                                                                                               DATE: 11/14/2022  Subjective: Pt reports improvement in trigger point in her L hip flexor after last session; she says it feels "a whole lot better." She reports 4/10 pain with walking right now.  There.ex: FOTO: 43 today  LOWER EXTREMITY ROM:     Active  Right eval Left eval  Hip flexion 125 110 !  Hip extension  10 10  Hip abduction 15 17  Hip adduction    Hip internal rotation 39 33  Hip external rotation 20 1 (20 passively)  Knee flexion    Knee extension    Ankle dorsiflexion    Ankle plantarflexion    Ankle inversion    Ankle eversion     (Blank rows = not tested)   LOWER EXTREMITY MMT:    MMT Right eval Left eval  Hip flexion 4- 4-  Hip extension 4- 4-  Hip abduction 4 4  Hip adduction 4 4  Hip internal rotation 4 3  Hip external rotation 4 4  Knee flexion 4+ 4+  Knee extension 5 5  Ankle dorsiflexion 5 5  Ankle plantarflexion    Ankle inversion    Ankle eversion     (Blank rows = not tested)   5XSTS: 13.7 seconds with UE support on chair  FABER test: still (+) on L side  Nustep L4 x 7 minutes with B UE/LE to improve cardiovascular endurance.  Discussed weekend activities.     Supine generalized LE stretching (hamstrings, hip IR/ER, hip flexors) x 5 minutes  STM and TP release to L hip flexor x 5 minutes - pt demonstrated improved L hip flexor tightness compared to last session, still mildly tender to palpation    Not today: Resisted Gait with Single black TB: 5x each direction (fwd/back/lateral) - increased LE fatigue at end of exercise Hooklying bridge: 2x10 with adduction ball squeezes Alternating foot taps onto 6-inch step: 1x15 each leg, no UE support Standing Hip abduction with BUE support, 2x10 each leg Supine SLR: with 3# AW for as many reps as possible each leg (10 R, 6 L), then finishing out the set of 15 bodyweight - increased L hip pain with weight High knees and sidesteps in // bars: 3 laps each,  Green TB around knees  3-way hip with green TB around knees: 2x8 each side  6"/12" step touches at stairs (B UE assist progressing to no UE assist)- SBA for safety.   Hallway walking with 2# AW:  (forward) with added alt. UE/LE touches.  Pt. Challenged with maintaining L knee/LE in midline.  Fatigue noted/ cuing to prevent hip IR.     PATIENT EDUCATION:  Education details: Pt educated on HEP form and frequency, role of PT going forward Person educated: Patient Education method: Explanation, Demonstration, and Handouts Education comprehension: verbalized understanding and returned demonstration  HOME EXERCISE PROGRAM: Access Code: [email or web address] URL: [email or web address] Date: 10/15/2022 Prepared by: Dorene Grebe Exercises - Supine Figure 4 Piriformis Stretch - 2 x daily - 7 x weekly - 2 sets - 3 reps - 30 seconds hold - Supine Straight Leg Raises - 2 x daily - 4-5 x weekly - 2 sets - 10 reps - Seated Hamstring Stretch with Chair - 2 x daily - 7 x weekly - 2 sets - 3 reps - 30 second hold - Seated Hip Abduction - 1 x daily - 4-5 x weekly - 2 sets - 10 reps   ASSESSMENT:  CLINICAL IMPRESSION: Pt with improved L hip pain since last session; progress note performed today. Pt's L hip AROM still limited compared to R side, however her flexion and abduction measurements were improved compared to the initial evaluation. Pt demonstrated improvements in hip MMT strength compared to IE, as well as with knee extension and flexion. Pt still required use of UE's on the armrests to complete 5xSTS, however her time significantly improved since the first  session. FABER test was still (+) today on the L side and pt is TTP at her L greater trochanter. Pt also reports significant improvement in pain with walking since IE; pt was 9/10 with walking initially and reports 4/10 pain now. Manual stretching, STM, and TP release performed to pt's L hip flexor at end of session; pt demonstrates improvement in tissue  extensibility and tightness compared to last session. Goals updated to reflect progress. Pt will benefit from continued PT services upon discharge to safely address deficits listed in patient problem list for decreased caregiver assistance and eventual return to PLOF.   OBJECTIVE IMPAIRMENTS: Abnormal gait, difficulty walking, decreased ROM, decreased strength, impaired flexibility, and pain.   ACTIVITY LIMITATIONS: bending, standing, squatting, and locomotion level  PARTICIPATION LIMITATIONS: cleaning, laundry, community activity, and yard work  PERSONAL FACTORS: Age, Time since onset of injury/illness/exacerbation, and 3+ comorbidities: Hx of bilateral THA, hx of R TKA, CAD, HTN  are also affecting patient's functional outcome.   REHAB POTENTIAL: Good  CLINICAL DECISION MAKING: Stable/uncomplicated  EVALUATION COMPLEXITY: Low   GOALS:  SHORT TERM GOALS: Target date: 11/12/2022 Pt will be independent and compliant with HEP in order to improve bilateral distal hamstring length by at least 10 degrees for decreased L hip pain. Baseline: R/L 90-90 hamstring length = 150 degrees Goal status: INITIAL   LONG TERM GOALS: Target date: 12/03/2022  Pt will improve FOTO score to at least 55 in order to demonstrate subjective improvement in pain and ADL performance. Baseline: Initial 36, 10/16: 43 Goal status: Not met, progressing  2.  Pt will increase L hip AROM by at least 10 degrees in all planes to improve function with squatting/bending activities. Baseline: See above for initial ROM measurements, 10/16: see for updated AROM measurements Goal status: Not met, progressing  3.  Pt will improve bilateral hip MMT scores to at least 4/5 in order to show improved hip musculature strength needed for improved walking/activity tolerance. Baseline: See above for initial MMT scores, 10/16: see for updated MMT scores Goal status: Not met, progressing  4.  Pt will decrease 5TSTS by at least 3  seconds in order to demonstrate clinically significant improvement in LE strength. Baseline: 20.2 seconds with BUE support on knees, 10/16: 13.7 seconds Goal status: GOAL MET (10/16)  5.  Pt will report decrease of at least 3 points on NPS scale with walking in order to show improved walking tolerance. Baseline: 9/10 NPS scale with walking on initial evaluation, 10/16: 4/10 NPS  Goal status: GOAL MET (10/16)  6.  Pt will demonstrate (-) FABER test on L side in order to show improvement in pain symptoms and return to PLOF. Baseline: (+) FABER on L, 10/16: (+) FABER Goal status: Not met   PLAN:  PT FREQUENCY: 2x/week  PT DURATION: 8 weeks  PLANNED INTERVENTIONS: Therapeutic exercises, Therapeutic activity, Neuromuscular re-education, Balance training, Self Care, Joint mobilization, Cryotherapy, Moist heat, Manual therapy, and Re-evaluation  PLAN FOR NEXT SESSION: Progress hip strengthening exercises (ankle weights, banded resistance, resisted gait), LE stretching and TP release (hamstrings, piriformis, trunk rotation, hip flexor (L)), check STG, focus on strengthening hip IR/ER on L side (clamshells, reverse clamshells)  Cammie Mcgee, PT, DPT # [phone] Cena Benton, SPT 11/14/22, 10:42 AM

## 2022-11-19 ENCOUNTER — Ambulatory Visit: Payer: PPO | Admitting: Physical Therapy

## 2022-11-19 ENCOUNTER — Encounter: Payer: Self-pay | Admitting: Physical Therapy

## 2022-11-19 DIAGNOSIS — M25652 Stiffness of left hip, not elsewhere classified: Secondary | ICD-10-CM

## 2022-11-19 DIAGNOSIS — R6889 Other general symptoms and signs: Secondary | ICD-10-CM

## 2022-11-19 DIAGNOSIS — M6281 Muscle weakness (generalized): Secondary | ICD-10-CM

## 2022-11-19 DIAGNOSIS — M25552 Pain in left hip: Secondary | ICD-10-CM | POA: Diagnosis not present

## 2022-11-19 DIAGNOSIS — R269 Unspecified abnormalities of gait and mobility: Secondary | ICD-10-CM

## 2022-11-19 NOTE — Therapy (Signed)
OUTPATIENT PHYSICAL THERAPY LOWER EXTREMITY TREATMENT/  Patient Name: Meagan Cox MRN: [id number] DOB:02/22/1940, 82 y.o., female Today's Date: 11/19/2022  END OF SESSION:  PT End of Session - 11/19/22 0949     Visit Number 11    Number of Visits 16    Date for PT Re-Evaluation 12/10/22    PT Start Time 0945    PT Stop Time 1026    PT Time Calculation (min) 41 min    Activity Tolerance Patient tolerated treatment well    Behavior During Therapy WFL for tasks assessed/performed            Past Medical History:  Diagnosis Date   Hyperlipidemia    Hypertension    Hypothyroidism    Past Surgical History:  Procedure Laterality Date   ABDOMINAL HYSTERECTOMY     CARDIAC CATHETERIZATION     30 years ago, [place]   CHOLECYSTECTOMY N/A 08/21/2015   Procedure: LAPAROSCOPIC CHOLECYSTECTOMY;  Surgeon: Leafy Ro, MD;  Location: [facility];  Service: General;  Laterality: N/A;   EYE SURGERY     eye lid lift   KNEE ARTHROSCOPY Right 08/28/2018   Procedure: ARTHROSCOPY KNEE;  Surgeon: Lyndle Herrlich, MD;  Location: [facility];  Service: Orthopedics;  Laterality: Right;   PARTIAL HYSTERECTOMY     REPLACEMENT TOTAL KNEE Right 2021   [facility]   right hip replacement Right 2017   [facility]   TONSILLECTOMY     Patient Active Problem List   Diagnosis Date Noted   Encounter for preventive care 12/30/2021   Asymptomatic postmenopausal estrogen deficiency 12/28/2021   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   Aortic atherosclerosis (HCC) 11/05/2020   Postsurgical menopause 06/09/2020   Breast cancer screening 05/04/2020   Travel advice encounter 10/19/2017   Loss of perception for taste 10/19/2017   Vertigo 10/19/2017   Essential hypertension 03/05/2017   S/P laparoscopic cholecystectomy 03/05/2017   Ptosis, both eyelids 01/04/2017   Complete tear, knee, anterior cruciate ligament 08/28/2016   History of total hip arthroplasty 08/28/2016   Tear of meniscus of knee 08/28/2016    Medicare annual wellness visit, subsequent 07/24/2015   Cerebrovascular small vessel disease 04/24/2015   Benign paroxysmal positional vertigo 04/24/2015   Right medial knee pain 04/24/2015   Shoulder pain, right 11/22/2013   Edema 05/14/2012   Polyarthritis of ankle 04/08/2011   Hypothyroidism 12/18/2010   Low back pain radiating to both legs 12/18/2010   Chronic right hip pain 12/18/2010   Obesity 05/09/2010   CAD (coronary artery disease) 05/09/2010   Hyperlipidemia 05/09/2010    PCP: Sherlene Shams, MD  REFERRING PROVIDER: Lyndle Herrlich, MD  REFERRING DIAG: Trochanteric bursitis, left hip; pain in left hip  THERAPY DIAG:  Pain in left hip  Stiffness of left hip, not elsewhere classified  Gait difficulty  Muscle weakness (generalized)  Decreased activity tolerance  Rationale for Evaluation and Treatment: Rehabilitation  ONSET DATE: 09/07/2022  SUBJECTIVE:   SUBJECTIVE STATEMENT: Pt reports to PT with c/c of L lateral hip pain that start about 5 weeks ago. Pt had L hip replaced on 07/12/2022, which subsequently got infected on 08/01/2022. Pt reports insidious onset of most recent hip pain and describes it as tight/sharp. She had a cortisone shot and Prednisone from her doctor, most recently on 09/19/2022. She denies any numbness/tingling or clicking/popping.   PERTINENT HISTORY: Hx of bilateral THA (2017), R TKA (2021), CAD, HTN PAIN:  Are you having pain? Yes: NPRS scale: 0/10 now, 8-9/10 at  worst Pain location: L lateral hip Pain description: Sharp, tight Aggravating factors: Walking Relieving factors: Rest, wine  PRECAUTIONS: None  RED FLAGS: None   WEIGHT BEARING RESTRICTIONS: No  FALLS:  Has patient fallen in last 6 months?  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: Retail banker - 2 wheeled  OCCUPATION: Retired  PLOF: Independent with household mobility with device (quad cane 24/7)  PATIENT GOALS: Get back to walking without pain, get ready for cruise next fall  NEXT MD VISIT: 11/30/2022  OBJECTIVE:   DIAGNOSTIC FINDINGS: N/A  PATIENT SURVEYS:  FOTO 36/55  COGNITION: Overall cognitive status: Within functional limits for tasks assessed     SENSATION: WFL  MUSCLE LENGTH: Hamstrings: Right 150 deg; Left 150 deg  POSTURE: No Significant postural limitations  LEG LENGTH: 34 inches bilaterally for true leg length (ASIS to medial malleolus)  LOWER EXTREMITY ROM:  Active ROM Right eval Left eval  Hip flexion 115 100  Hip extension 12 7  Hip abduction 13 7  Hip adduction    Hip internal rotation 10 53  Hip external rotation 5 26  Knee flexion [facility] WFL  Knee extension [facility] WFL  Ankle dorsiflexion WFL WFL   (Blank rows = not tested)  LOWER EXTREMITY MMT:  MMT Right eval Left eval  Hip flexion 3+ 3 !  Hip extension 4 3+  Hip abduction 3+ 3+  Hip adduction    Hip internal rotation 3+ 3+  Hip external rotation 4- 3+  Knee flexion 4- 4-  Knee extension 4+ 4+  Ankle dorsiflexion    Ankle plantarflexion    Ankle inversion    Ankle eversion     (Blank rows = not tested)  LOWER EXTREMITY SPECIAL TESTS:  Hip special tests: Luisa Hart (FABER) test: positive on L and (FADIR) test: negative bilaterally  FUNCTIONAL TESTS:  5 times sit to stand: 20.2 seconds (pt required use of Ue's on knees to complete each rep)  GAIT: Distance walked: 20 feet in clinic Assistive device utilized: Quad cane small base Level of assistance: Complete Independence Comments: Antalgic gait, (+) trendelenburg noted on L side   TODAY'S TREATMENT:                                                                                                                              DATE:  11/19/2022  Subjective: Pt reports mild improvement in trigger point in her L hip flexor after last session but she says it still bothers her primarily with walking.  There.ex:  Nustep L4 x 7 minutes with B UE/LE to improve cardiovascular endurance.  Discussed weekend activities.     Sitting BP (taken on R wrist): 167/97   Supine generalized LE stretching (hamstrings, hip IR/ER, hip  flexors) x 5 minutes  STM and TP release to L hip flexor x 5 minutes - pt demonstrated improved L hip flexor tightness compared to last session, still mildly tender to palpation  3-way hip: 1x10 each side with green TB around knees   Resisted Sidesteps with green TB around knees: 3 laps  Hooklying Bridge with clamshell with green TB around knees: 2x10   Resisted Gait with Single black TB: 5x each direction (fwd/back/lateral) - pt only able to tolerate 3x laterally each direction secondary to increased L anterior/lateral hip pain  Alternating 6-inch step-taps: 1x10 each foot   Not today: Hooklying bridge: 2x10 with adduction ball squeezes Alternating foot taps onto 6-inch step: 1x15 each leg, no UE support Standing Hip abduction with BUE support, 2x10 each leg Supine SLR: with 3# AW for as many reps as possible each leg (10 R, 6 L), then finishing out the set of 15 bodyweight - increased L hip pain with weight High knees and sidesteps in // bars: 3 laps each, Green TB around knees  3-way hip with green TB around knees: 2x8 each side  6"/12" step touches at stairs (B UE assist progressing to no UE assist)- SBA for safety.   Hallway walking with 2# AW:  (forward) with added alt. UE/LE touches.  Pt. Challenged with maintaining L knee/LE in midline.  Fatigue noted/ cuing to prevent hip IR.     PATIENT EDUCATION:  Education details: Pt educated on HEP form and frequency, role of PT going forward Person educated: Patient Education method: Explanation, Demonstration, and Handouts Education comprehension:  verbalized understanding and returned demonstration  HOME EXERCISE PROGRAM: Access Code: [email or web address] URL: [email or web address] Date: 10/15/2022 Prepared by: Dorene Grebe Exercises - Supine Figure 4 Piriformis Stretch - 2 x daily - 7 x weekly - 2 sets - 3 reps - 30 seconds hold - Supine Straight Leg Raises - 2 x daily - 4-5 x weekly - 2 sets - 10 reps - Seated Hamstring Stretch with Chair - 2 x daily - 7 x weekly - 2 sets - 3 reps - 30 second hold - Seated Hip Abduction - 1 x daily - 4-5 x weekly - 2 sets - 10 reps   ASSESSMENT:  CLINICAL IMPRESSION: Pt. with improvement in L hip pain since last session. PT continues to experience increased L hip pain secondary to a trigger point in her L hip flexor with weightbearing/walking. Manual stretching and STM performed at start of session to try and improve L hip flexor extensibility. Pt educated to continue using heat, stretch, and massage at home to improve carry-over for improving TP pain. The rest of the session consisted of exercises to improve pt's hip strength and stability. Pt continues to be limited with resisted sidesteps in both directions secondary to L hip pain. Pt will benefit from continued PT services upon discharge to safely address deficits listed in patient problem list for decreased caregiver assistance and eventual return to PLOF.   OBJECTIVE IMPAIRMENTS: Abnormal gait, difficulty walking, decreased ROM, decreased strength, impaired flexibility, and pain.   ACTIVITY LIMITATIONS: bending, standing, squatting, and locomotion level  PARTICIPATION LIMITATIONS: cleaning, laundry, community activity, and yard work  PERSONAL FACTORS: Age, Time since onset of injury/illness/exacerbation, and 3+ comorbidities: Hx of bilateral THA, hx of R TKA, CAD, HTN  are also affecting patient's functional outcome.   REHAB POTENTIAL: Good  CLINICAL DECISION MAKING: Stable/uncomplicated  EVALUATION COMPLEXITY: Low   GOALS:  SHORT TERM  GOALS: Target date: 11/12/2022 Pt will be  independent and compliant with HEP in order to improve bilateral distal hamstring length by at least 10 degrees for decreased L hip pain. Baseline: R/L 90-90 hamstring length = 150 degrees Goal status: On-going   LONG TERM GOALS: Target date: 12/03/2022  Pt will improve FOTO score to at least 55 in order to demonstrate subjective improvement in pain and ADL performance. Baseline: Initial 36, 10/16: 43 Goal status: Not met, progressing  2.  Pt will increase L hip AROM by at least 10 degrees in all planes to improve function with squatting/bending activities. Baseline: See above for initial ROM measurements, 10/16: see for updated AROM measurements Goal status: Not met, progressing  3.  Pt will improve bilateral hip MMT scores to at least 4/5 in order to show improved hip musculature strength needed for improved walking/activity tolerance. Baseline: See above for initial MMT scores, 10/16: see for updated MMT scores Goal status: Not met, progressing  4.  Pt will decrease 5TSTS by at least 3 seconds in order to demonstrate clinically significant improvement in LE strength. Baseline: 20.2 seconds with BUE support on knees, 10/16: 13.7 seconds Goal status: GOAL MET (10/16)  5.  Pt will report decrease of at least 3 points on NPS scale with walking in order to show improved walking tolerance. Baseline: 9/10 NPS scale with walking on initial evaluation, 10/16: 4/10 NPS  Goal status: GOAL MET (10/16)  6.  Pt will demonstrate (-) FABER test on L side in order to show improvement in pain symptoms and return to PLOF. Baseline: (+) FABER on L, 10/16: (+) FABER Goal status: Not met   PLAN:  PT FREQUENCY: 2x/week  PT DURATION: 8 weeks  PLANNED INTERVENTIONS: Therapeutic exercises, Therapeutic activity, Neuromuscular re-education, Balance training, Self Care, Joint mobilization, Cryotherapy, Moist heat, Manual therapy, and Re-evaluation  PLAN FOR  NEXT SESSION: Progress hip strengthening exercises (ankle weights, banded resistance, resisted gait), LE stretching and TP release (hamstrings, piriformis, trunk rotation, hip flexor (L)), check STG, focus on strengthening hip IR/ER on L side (clamshells, reverse clamshells)  Cammie Mcgee, PT, DPT # [phone] Cena Benton, SPT 11/19/22, 11:50 AM

## 2022-11-21 ENCOUNTER — Ambulatory Visit: Payer: PPO | Admitting: Physical Therapy

## 2022-11-21 ENCOUNTER — Encounter: Payer: Self-pay | Admitting: Physical Therapy

## 2022-11-21 DIAGNOSIS — M25552 Pain in left hip: Secondary | ICD-10-CM | POA: Diagnosis not present

## 2022-11-21 DIAGNOSIS — R6889 Other general symptoms and signs: Secondary | ICD-10-CM

## 2022-11-21 DIAGNOSIS — M25652 Stiffness of left hip, not elsewhere classified: Secondary | ICD-10-CM

## 2022-11-21 DIAGNOSIS — R269 Unspecified abnormalities of gait and mobility: Secondary | ICD-10-CM

## 2022-11-21 DIAGNOSIS — M6281 Muscle weakness (generalized): Secondary | ICD-10-CM

## 2022-11-21 NOTE — Therapy (Signed)
OUTPATIENT PHYSICAL THERAPY LOWER EXTREMITY TREATMENT  Patient Name: Meagan Cox MRN: [id number] DOB:07-20-1940, 82 y.o., female Today's Date: 11/21/2022  END OF SESSION:  PT End of Session - 11/21/22 0950     Visit Number 12    Number of Visits 16    Date for PT Re-Evaluation 12/10/22    PT Start Time 0945    PT Stop Time 1025    PT Time Calculation (min) 40 min    Activity Tolerance Patient tolerated treatment well    Behavior During Therapy [facility] for tasks assessed/performed             Past Medical History:  Diagnosis Date   Hyperlipidemia    Hypertension    Hypothyroidism    Past Surgical History:  Procedure Laterality Date   ABDOMINAL HYSTERECTOMY     CARDIAC CATHETERIZATION     30 years ago, [place]   CHOLECYSTECTOMY N/A 08/21/2015   Procedure: LAPAROSCOPIC CHOLECYSTECTOMY;  Surgeon: Leafy Ro, MD;  Location: [facility];  Service: General;  Laterality: N/A;   EYE SURGERY     eye lid lift   KNEE ARTHROSCOPY Right 08/28/2018   Procedure: ARTHROSCOPY KNEE;  Surgeon: Lyndle Herrlich, MD;  Location: [facility];  Service: Orthopedics;  Laterality: Right;   PARTIAL HYSTERECTOMY     REPLACEMENT TOTAL KNEE Right 2021   [facility]   right hip replacement Right 2017   [facility]   TONSILLECTOMY     Patient Active Problem List   Diagnosis Date Noted   Encounter for preventive care 12/30/2021   Asymptomatic postmenopausal estrogen deficiency 12/28/2021   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   Aortic atherosclerosis (HCC) 11/05/2020   Postsurgical menopause 06/09/2020   Breast cancer screening 05/04/2020   Travel advice encounter 10/19/2017   Loss of perception for taste 10/19/2017   Vertigo 10/19/2017   Essential hypertension 03/05/2017   S/P laparoscopic cholecystectomy 03/05/2017   Ptosis, both eyelids 01/04/2017   Complete tear, knee, anterior cruciate ligament 08/28/2016   History of total hip arthroplasty 08/28/2016   Tear of meniscus of knee 08/28/2016    Medicare annual wellness visit, subsequent 07/24/2015   Cerebrovascular small vessel disease 04/24/2015   Benign paroxysmal positional vertigo 04/24/2015   Right medial knee pain 04/24/2015   Shoulder pain, right 11/22/2013   Edema 05/14/2012   Polyarthritis of ankle 04/08/2011   Hypothyroidism 12/18/2010   Low back pain radiating to both legs 12/18/2010   Chronic right hip pain 12/18/2010   Obesity 05/09/2010   CAD (coronary artery disease) 05/09/2010   Hyperlipidemia 05/09/2010    PCP: Sherlene Shams, MD  REFERRING PROVIDER: Lyndle Herrlich, MD  REFERRING DIAG: Trochanteric bursitis, left hip; pain in left hip  THERAPY DIAG:  Pain in left hip  Stiffness of left hip, not elsewhere classified  Gait difficulty  Muscle weakness (generalized)  Decreased activity tolerance  Rationale for Evaluation and Treatment: Rehabilitation  ONSET DATE: 09/07/2022  SUBJECTIVE:   SUBJECTIVE STATEMENT: Pt reports to PT with c/c of L lateral hip pain that start about 5 weeks ago. Pt had L hip replaced on 07/12/2022, which subsequently got infected on 08/01/2022. Pt reports insidious onset of most recent hip pain and describes it as tight/sharp. She had a cortisone shot and Prednisone from her doctor, most recently on 09/19/2022. She denies any numbness/tingling or clicking/popping.   PERTINENT HISTORY: Hx of bilateral THA (2017), R TKA (2021), CAD, HTN PAIN:  Are you having pain? Yes: NPRS scale: 0/10 now, 8-9/10  at worst Pain location: L lateral hip Pain description: Sharp, tight Aggravating factors: Walking Relieving factors: Rest, wine  PRECAUTIONS: None  RED FLAGS: None   WEIGHT BEARING RESTRICTIONS: No  FALLS:  Has patient fallen in last 6 months?  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: Retail banker - 2 wheeled  OCCUPATION: Retired  PLOF: Independent with household mobility with device (quad cane 24/7)  PATIENT GOALS: Get back to walking without pain, get ready for cruise next fall  NEXT MD VISIT: 11/30/2022  OBJECTIVE:   DIAGNOSTIC FINDINGS: N/A  PATIENT SURVEYS:  FOTO 36/55  COGNITION: Overall cognitive status: Within functional limits for tasks assessed     SENSATION: WFL  MUSCLE LENGTH: Hamstrings: Right 150 deg; Left 150 deg  POSTURE: No Significant postural limitations  LEG LENGTH: 34 inches bilaterally for true leg length (ASIS to medial malleolus)  LOWER EXTREMITY ROM:  Active ROM Right eval Left eval  Hip flexion 115 100  Hip extension 12 7  Hip abduction 13 7  Hip adduction    Hip internal rotation 10 53  Hip external rotation 5 26  Knee flexion [facility] WFL  Knee extension [facility] WFL  Ankle dorsiflexion WFL WFL   (Blank rows = not tested)  LOWER EXTREMITY MMT:  MMT Right eval Left eval  Hip flexion 3+ 3 !  Hip extension 4 3+  Hip abduction 3+ 3+  Hip adduction    Hip internal rotation 3+ 3+  Hip external rotation 4- 3+  Knee flexion 4- 4-  Knee extension 4+ 4+  Ankle dorsiflexion    Ankle plantarflexion    Ankle inversion    Ankle eversion     (Blank rows = not tested)  LOWER EXTREMITY SPECIAL TESTS:  Hip special tests: Luisa Hart (FABER) test: positive on L and (FADIR) test: negative bilaterally  FUNCTIONAL TESTS:  5 times sit to stand: 20.2 seconds (pt required use of Ue's on knees to complete each rep)  GAIT: Distance walked: 20 feet in clinic Assistive device utilized: Quad cane small base Level of assistance: Complete Independence Comments: Antalgic gait, (+) trendelenburg noted on L side   TODAY'S TREATMENT:                                                                                                                              DATE:  11/21/2022  Subjective: Pt reports mild improvement in trigger point in her L hip flexor after last session but she says it still bothers her primarily with walking.  There.ex:  Nustep L4 x 7 minutes with B UE/LE to improve cardiovascular endurance.  Discussed weekend activities.     Supine generalized LE stretching (hamstrings, hip IR/ER, hip flexors) x 5 minutes - improved hamstring flexibility  today  STM and TP release to L hip flexor x 5 minutes  Seated Resisted Hip abduction: red TB, 2x15  Resisted Sidesteps with green TB around knees: 3 laps   Hooklying Bridge with clamshell with green TB around knees: 2x10   Hallway ambulation with added alternating knee taps/backwards walking: 2 laps each   Not today: Resisted Gait with Single black TB: 5x each direction (fwd/back/lateral) - pt only able to tolerate 3x laterally each direction secondary to increased L anterior/lateral hip pain Alternating 6-inch step-taps: 1x10 each foot Hooklying bridge: 2x10 with adduction ball squeezes Alternating foot taps onto 6-inch step: 1x15 each leg, no UE support Standing Hip abduction with BUE support, 2x10 each leg Supine SLR: with 3# AW for as many reps as possible each leg (10 R, 6 L), then finishing out the set of 15 bodyweight - increased L hip pain with weight 6"/12" step touches at stairs (B UE assist progressing to no UE assist)- SBA for safety.   Hallway walking with 2# AW:  (forward) with added alt. UE/LE touches.  Pt. Challenged with maintaining L knee/LE in midline.  Fatigue noted/ cuing to prevent hip IR.     PATIENT EDUCATION:  Education details: Pt educated on HEP form and frequency, role of PT going forward Person educated: Patient Education method: Explanation, Demonstration, and Handouts Education comprehension: verbalized understanding and returned demonstration  HOME EXERCISE PROGRAM: Access Code: [email or web address] URL: [email or web address] Date: 10/15/2022 Prepared  by: Dorene Grebe Exercises - Supine Figure 4 Piriformis Stretch - 2 x daily - 7 x weekly - 2 sets - 3 reps - 30 seconds hold - Supine Straight Leg Raises - 2 x daily - 4-5 x weekly - 2 sets - 10 reps - Seated Hamstring Stretch with Chair - 2 x daily - 7 x weekly - 2 sets - 3 reps - 30 second hold - Seated Hip Abduction - 1 x daily - 4-5 x weekly - 2 sets - 10 reps   ASSESSMENT:  CLINICAL IMPRESSION: Pt. reports with no significant changes from last session. Session still began with stretching and TP release to L hip flexor to keep improving tissue extensibility and flexibility of that area; she continues to be tender to palpation however her extensibility does feel improved since initial evaluation. The rest of the session consisted of hip strengthening exercises which she tolerated well with no pain. Pt requires verbal cueing throughout to prevent trunk compensation with 3-way hip, as well as to prevent compensation when performing walking marches. Pt will benefit from continued PT services upon discharge to safely address deficits listed in patient problem list for decreased caregiver assistance and eventual return to PLOF.   OBJECTIVE IMPAIRMENTS: Abnormal gait, difficulty walking, decreased ROM, decreased strength, impaired flexibility, and pain.   ACTIVITY LIMITATIONS: bending, standing, squatting, and locomotion level  PARTICIPATION LIMITATIONS: cleaning, laundry, community activity, and yard work  PERSONAL FACTORS: Age, Time since onset of injury/illness/exacerbation, and 3+ comorbidities: Hx of bilateral THA, hx of R TKA, CAD, HTN  are also affecting patient's functional outcome.   REHAB POTENTIAL: Good  CLINICAL DECISION MAKING: Stable/uncomplicated  EVALUATION COMPLEXITY: Low   GOALS:  SHORT TERM GOALS: Target date: 11/12/2022 Pt will be independent and compliant with HEP in order to improve bilateral distal hamstring length by at least 10 degrees for decreased L hip  pain. Baseline: R/L 90-90 hamstring length = 150 degrees Goal status: On-going   LONG TERM GOALS: Target date: 12/03/2022  Pt will improve FOTO score to  at least 55 in order to demonstrate subjective improvement in pain and ADL performance. Baseline: Initial 36, 10/16: 43 Goal status: Not met, progressing  2.  Pt will increase L hip AROM by at least 10 degrees in all planes to improve function with squatting/bending activities. Baseline: See above for initial ROM measurements, 10/16: see for updated AROM measurements Goal status: Not met, progressing  3.  Pt will improve bilateral hip MMT scores to at least 4/5 in order to show improved hip musculature strength needed for improved walking/activity tolerance. Baseline: See above for initial MMT scores, 10/16: see for updated MMT scores Goal status: Not met, progressing  4.  Pt will decrease 5TSTS by at least 3 seconds in order to demonstrate clinically significant improvement in LE strength. Baseline: 20.2 seconds with BUE support on knees, 10/16: 13.7 seconds Goal status: GOAL MET (10/16)  5.  Pt will report decrease of at least 3 points on NPS scale with walking in order to show improved walking tolerance. Baseline: 9/10 NPS scale with walking on initial evaluation, 10/16: 4/10 NPS  Goal status: GOAL MET (10/16)  6.  Pt will demonstrate (-) FABER test on L side in order to show improvement in pain symptoms and return to PLOF. Baseline: (+) FABER on L, 10/16: (+) FABER Goal status: Not met   PLAN:  PT FREQUENCY: 2x/week  PT DURATION: 8 weeks  PLANNED INTERVENTIONS: Therapeutic exercises, Therapeutic activity, Neuromuscular re-education, Balance training, Self Care, Joint mobilization, Cryotherapy, Moist heat, Manual therapy, and Re-evaluation  PLAN FOR NEXT SESSION: D/C from PT, reassess goals  Cammie Mcgee, PT, DPT # [phone] Cena Benton, SPT 11/21/22, 11:29 AM

## 2022-11-26 ENCOUNTER — Encounter: Payer: Self-pay | Admitting: Physical Therapy

## 2022-11-26 ENCOUNTER — Ambulatory Visit: Payer: PPO | Admitting: Physical Therapy

## 2022-11-26 DIAGNOSIS — M6281 Muscle weakness (generalized): Secondary | ICD-10-CM

## 2022-11-26 DIAGNOSIS — M25652 Stiffness of left hip, not elsewhere classified: Secondary | ICD-10-CM

## 2022-11-26 DIAGNOSIS — R6889 Other general symptoms and signs: Secondary | ICD-10-CM

## 2022-11-26 DIAGNOSIS — R269 Unspecified abnormalities of gait and mobility: Secondary | ICD-10-CM

## 2022-11-26 DIAGNOSIS — M25552 Pain in left hip: Secondary | ICD-10-CM | POA: Diagnosis not present

## 2022-11-26 NOTE — Therapy (Signed)
OUTPATIENT PHYSICAL THERAPY LOWER EXTREMITY TREATMENT/DISCHARGE  Patient Name: Meagan Cox MRN: [id number] DOB:11-04-1940, 82 y.o., female Today's Date: 11/26/2022  END OF SESSION:  PT End of Session - 11/26/22 0948     Visit Number 13    Number of Visits 16    Date for PT Re-Evaluation 12/10/22    PT Start Time 0945    PT Stop Time 1015    PT Time Calculation (min) 30 min    Activity Tolerance Patient tolerated treatment well    Behavior During Therapy [facility] for tasks assessed/performed              Past Medical History:  Diagnosis Date   Hyperlipidemia    Hypertension    Hypothyroidism    Past Surgical History:  Procedure Laterality Date   ABDOMINAL HYSTERECTOMY     CARDIAC CATHETERIZATION     30 years ago, [place]   CHOLECYSTECTOMY N/A 08/21/2015   Procedure: LAPAROSCOPIC CHOLECYSTECTOMY;  Surgeon: Leafy Ro, MD;  Location: [facility];  Service: General;  Laterality: N/A;   EYE SURGERY     eye lid lift   KNEE ARTHROSCOPY Right 08/28/2018   Procedure: ARTHROSCOPY KNEE;  Surgeon: Lyndle Herrlich, MD;  Location: [facility];  Service: Orthopedics;  Laterality: Right;   PARTIAL HYSTERECTOMY     REPLACEMENT TOTAL KNEE Right 2021   Bowers   right hip replacement Right 2017   Bowers   TONSILLECTOMY     Patient Active Problem List   Diagnosis Date Noted   Encounter for preventive care 12/30/2021   Asymptomatic postmenopausal estrogen deficiency 12/28/2021   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   Aortic atherosclerosis (HCC) 11/05/2020   Postsurgical menopause 06/09/2020   Breast cancer screening 05/04/2020   Travel advice encounter 10/19/2017   Loss of perception for taste 10/19/2017   Vertigo 10/19/2017   Essential hypertension 03/05/2017   S/P laparoscopic cholecystectomy 03/05/2017   Ptosis, both eyelids 01/04/2017   Complete tear, knee, anterior cruciate ligament 08/28/2016   History of total hip arthroplasty 08/28/2016   Tear of meniscus of  knee 08/28/2016   Medicare annual wellness visit, subsequent 07/24/2015   Cerebrovascular small vessel disease 04/24/2015   Benign paroxysmal positional vertigo 04/24/2015   Right medial knee pain 04/24/2015   Shoulder pain, right 11/22/2013   Edema 05/14/2012   Polyarthritis of ankle 04/08/2011   Hypothyroidism 12/18/2010   Low back pain radiating to both legs 12/18/2010   Chronic right hip pain 12/18/2010   Obesity 05/09/2010   CAD (coronary artery disease) 05/09/2010   Hyperlipidemia 05/09/2010    PCP: Sherlene Shams, MD  REFERRING PROVIDER: Lyndle Herrlich, MD  REFERRING DIAG: Trochanteric bursitis, left hip; pain in left hip  THERAPY DIAG:  Pain in left hip  Stiffness of left hip, not elsewhere classified  Gait difficulty  Muscle weakness (generalized)  Decreased activity tolerance  Rationale for Evaluation and Treatment: Rehabilitation  ONSET DATE: 09/07/2022  SUBJECTIVE:   SUBJECTIVE STATEMENT: Pt reports to PT with c/c of L lateral hip pain that start about 5 weeks ago. Pt had L hip replaced on 07/12/2022, which subsequently got infected on 08/01/2022. Pt reports insidious onset of most recent hip pain and describes it as tight/sharp. She had a cortisone shot and Prednisone from her doctor, most recently on 09/19/2022. She denies any numbness/tingling or clicking/popping.   PERTINENT HISTORY: Hx of bilateral THA (2017), R TKA (2021), CAD, HTN PAIN:  Are you having pain? Yes: NPRS scale: 0/10 now,  8-9/10 at worst Pain location: L lateral hip Pain description: Sharp, tight Aggravating factors: Walking Relieving factors: Rest, wine  PRECAUTIONS: None  RED FLAGS: None   WEIGHT BEARING RESTRICTIONS: No  FALLS:  Has patient fallen in last 6 months?  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: Retail banker - 2 wheeled  OCCUPATION: Retired  PLOF: Independent with household mobility with device (quad cane 24/7)  PATIENT GOALS: Get back to walking without pain, get ready for cruise next fall  NEXT MD VISIT: 11/30/2022  OBJECTIVE:   DIAGNOSTIC FINDINGS: N/A  PATIENT SURVEYS:  FOTO 36/55  COGNITION: Overall cognitive status: Within functional limits for tasks assessed     SENSATION: WFL  MUSCLE LENGTH: Hamstrings: Right 150 deg; Left 150 deg  POSTURE: No Significant postural limitations  LEG LENGTH: 34 inches bilaterally for true leg length (ASIS to medial malleolus)  LOWER EXTREMITY ROM:  Active ROM Right eval Left eval  Hip flexion 115 100  Hip extension 12 7  Hip abduction 13 7  Hip adduction    Hip internal rotation 10 53  Hip external rotation 5 26  Knee flexion [facility] WFL  Knee extension [facility] WFL  Ankle dorsiflexion WFL WFL   (Blank rows = not tested)  LOWER EXTREMITY MMT:  MMT Right eval Left eval  Hip flexion 3+ 3 !  Hip extension 4 3+  Hip abduction 3+ 3+  Hip adduction    Hip internal rotation 3+ 3+  Hip external rotation 4- 3+  Knee flexion 4- 4-  Knee extension 4+ 4+  Ankle dorsiflexion    Ankle plantarflexion    Ankle inversion    Ankle eversion     (Blank rows = not tested)  LOWER EXTREMITY SPECIAL TESTS:  Hip special tests: Luisa Hart (FABER) test: positive on L and (FADIR) test: negative bilaterally  FUNCTIONAL TESTS:  5 times sit to stand: 20.2 seconds (pt required use of Ue's on knees to complete each rep)  GAIT: Distance walked: 20 feet in clinic Assistive device utilized: Quad cane small base Level of assistance: Complete Independence Comments: Antalgic gait, (+) trendelenburg noted on L side   TODAY'S TREATMENT:                                                                                                                               DATE: 11/26/2022  Subjective: Pt reports mild improvement in trigger point in her L hip flexor after last session; pt reports she did a lot of walking this weekend and says her hip got pretty sore. She still feels the pain primarily with weightbearing.   There.ex: Nustep L4 x 5 minutes with B UE/LE to improve cardiovascular endurance.  Discussed weekend activities.  LOWER EXTREMITY MMT:    MMT Right eval Left eval  Hip flexion 4 4  Hip extension    Hip abduction 4 4  Hip adduction 4 4  Hip internal rotation 4 4-  Hip external rotation 5 5  Knee flexion 5 5  Knee extension 5 5  Ankle dorsiflexion 5 5  Ankle plantarflexion    Ankle inversion    Ankle eversion     (Blank rows = not tested)   FOTO: 62  STM and TP release to L hip flexor x 5 minutes  5XSTS: 9.6 seconds with UE support on chair  FABER: (+) on L  HEP reviewed and progressed at end of session   PATIENT EDUCATION:  Education details: Pt educated on HEP form and frequency, role of PT going forward Person educated: Patient Education method: Programmer, multimedia, Facilities manager, and Handouts Education comprehension: verbalized understanding and returned demonstration  HOME EXERCISE PROGRAM: Access Code: [email or web address] URL: [email or web address] Date: 10/15/2022 Prepared by: Dorene Grebe Exercises - Supine Figure 4 Piriformis Stretch - 2 x daily - 7 x weekly - 2 sets - 3 reps - 30 seconds hold - Supine Straight Leg Raises - 2 x daily - 4-5 x weekly - 2 sets - 10 reps - Seated Hamstring Stretch with Chair - 2 x daily - 7 x weekly - 2 sets - 3 reps - 30 second hold - Seated Hip Abduction - 1 x daily - 4-5 x weekly - 2 sets - 10 reps   ASSESSMENT:  CLINICAL IMPRESSION: Pt presents to PT for discharge. Session today consisted of retesting her 5XSTS and LE MMT scores; pt demonstrated improvement in 5XSTS however still requires UE support when completing the exercise. Pt demonstrated improvements in all LE MMT  scores compared to initial evaluation. Pt's FOTO score demonstrated great improvement as well. The rest of the session consisted of manual stretching and TP release to her L hip flexor; she continues to show improvements in pain and was able to get her thigh flat on the table for the first time today with no pain. HEP modified and progressed at end of session; pt educated to continue performing strengthening and stretching exercises as pain tolerates so as not to lose progress. Pt agreeable to discharging from PT at this time with plan to independently manage condition at home.   OBJECTIVE IMPAIRMENTS: Abnormal gait, difficulty walking, decreased ROM, decreased strength, impaired flexibility, and pain.   ACTIVITY LIMITATIONS: bending, standing, squatting, and locomotion level  PARTICIPATION LIMITATIONS: cleaning, laundry, community activity, and yard work  PERSONAL FACTORS: Age, Time since onset of injury/illness/exacerbation, and 3+ comorbidities: Hx of bilateral THA, hx of R TKA, CAD, HTN  are also affecting patient's functional outcome.   REHAB POTENTIAL: Good  CLINICAL DECISION MAKING: Stable/uncomplicated  EVALUATION COMPLEXITY: Low   GOALS:  SHORT TERM GOALS: Target date: 11/12/2022 Pt will be independent and compliant with HEP in order to improve bilateral distal hamstring length by at least 10 degrees for decreased L hip pain. Baseline: R/L 90-90 hamstring length = 150 degrees Goal status: Partially met   LONG TERM GOALS: Target date: 12/03/2022  Pt will improve FOTO score to at least 55 in order to demonstrate subjective improvement in pain and ADL performance. Baseline: Initial 36, 10/16: 43, 10/28: 62 Goal status: GOAL MET  2.  Pt will increase L hip AROM by at least 10 degrees in all planes to improve function with squatting/bending activities. Baseline: See above for initial ROM measurements, 10/16: see for updated  AROM measurements Goal status: Not met,  progressing  3.  Pt will improve bilateral hip MMT scores to at least 4/5 in order to show improved hip musculature strength needed for improved walking/activity tolerance. Baseline: See above for initial MMT scores, 10/16: see for updated MMT scores, 10/28: see for update Goal status: Not met, progressing  4.  Pt will decrease 5TSTS by at least 3 seconds in order to demonstrate clinically significant improvement in LE strength. Baseline: 20.2 seconds with BUE support on knees, 10/16: 13.7 seconds (UE support on chair), 10/28: 9.6 seconds (UE support on chair) Goal status: GOAL MET (10/16)  5.  Pt will report decrease of at least 3 points on NPS scale with walking in order to show improved walking tolerance. Baseline: 9/10 NPS scale with walking on initial evaluation, 10/16: 4/10 NPS  Goal status: GOAL MET (10/16)  6.  Pt will demonstrate (-) FABER test on L side in order to show improvement in pain symptoms and return to PLOF. Baseline: (+) FABER on L, 10/16: (+) FABER, 10/28: (+) Goal status: Not met   PLAN:  PT FREQUENCY: 2x/week  PT DURATION: 8 weeks  PLANNED INTERVENTIONS: Therapeutic exercises, Therapeutic activity, Neuromuscular re-education, Balance training, Self Care, Joint mobilization, Cryotherapy, Moist heat, Manual therapy, and Re-evaluation  PLAN FOR NEXT SESSION: D/C from PT, reassess goals  Cammie Mcgee, PT, DPT # [phone] Cena Benton, SPT 11/26/22, 11:13 AM

## 2022-11-27 DIAGNOSIS — H1045 Other chronic allergic conjunctivitis: Secondary | ICD-10-CM | POA: Diagnosis not present

## 2022-11-27 DIAGNOSIS — H04129 Dry eye syndrome of unspecified lacrimal gland: Secondary | ICD-10-CM | POA: Diagnosis not present

## 2022-11-27 DIAGNOSIS — H524 Presbyopia: Secondary | ICD-10-CM | POA: Diagnosis not present

## 2022-11-27 DIAGNOSIS — H34212 Partial retinal artery occlusion, left eye: Secondary | ICD-10-CM | POA: Diagnosis not present

## 2022-11-27 DIAGNOSIS — H2513 Age-related nuclear cataract, bilateral: Secondary | ICD-10-CM | POA: Diagnosis not present

## 2022-12-05 ENCOUNTER — Encounter: Payer: Self-pay | Admitting: Internal Medicine

## 2022-12-05 ENCOUNTER — Other Ambulatory Visit: Payer: Self-pay | Admitting: Internal Medicine

## 2022-12-10 DIAGNOSIS — Z96642 Presence of left artificial hip joint: Secondary | ICD-10-CM | POA: Diagnosis not present

## 2022-12-19 ENCOUNTER — Ambulatory Visit (INDEPENDENT_AMBULATORY_CARE_PROVIDER_SITE_OTHER): Payer: PPO

## 2022-12-19 ENCOUNTER — Encounter: Payer: Self-pay | Admitting: Internal Medicine

## 2022-12-19 ENCOUNTER — Ambulatory Visit (INDEPENDENT_AMBULATORY_CARE_PROVIDER_SITE_OTHER): Payer: PPO | Admitting: Internal Medicine

## 2022-12-19 VITALS — BP 128/74 | HR 60 | Temp 97.9°F | Ht 62.0 in | Wt 169.0 lb

## 2022-12-19 DIAGNOSIS — I10 Essential (primary) hypertension: Secondary | ICD-10-CM

## 2022-12-19 DIAGNOSIS — R059 Cough, unspecified: Secondary | ICD-10-CM | POA: Diagnosis not present

## 2022-12-19 DIAGNOSIS — E559 Vitamin D deficiency, unspecified: Secondary | ICD-10-CM

## 2022-12-19 DIAGNOSIS — D751 Secondary polycythemia: Secondary | ICD-10-CM

## 2022-12-19 DIAGNOSIS — I700 Atherosclerosis of aorta: Secondary | ICD-10-CM

## 2022-12-19 DIAGNOSIS — E673 Hypervitaminosis D: Secondary | ICD-10-CM | POA: Diagnosis not present

## 2022-12-19 DIAGNOSIS — Z6830 Body mass index (BMI) 30.0-30.9, adult: Secondary | ICD-10-CM

## 2022-12-19 DIAGNOSIS — Q677 Pectus carinatum: Secondary | ICD-10-CM | POA: Diagnosis not present

## 2022-12-19 DIAGNOSIS — Z23 Encounter for immunization: Secondary | ICD-10-CM | POA: Diagnosis not present

## 2022-12-19 DIAGNOSIS — R481 Agnosia: Secondary | ICD-10-CM

## 2022-12-19 DIAGNOSIS — Z0000 Encounter for general adult medical examination without abnormal findings: Secondary | ICD-10-CM | POA: Diagnosis not present

## 2022-12-19 DIAGNOSIS — E7800 Pure hypercholesterolemia, unspecified: Secondary | ICD-10-CM

## 2022-12-19 DIAGNOSIS — E034 Atrophy of thyroid (acquired): Secondary | ICD-10-CM | POA: Diagnosis not present

## 2022-12-19 DIAGNOSIS — E6609 Other obesity due to excess calories: Secondary | ICD-10-CM | POA: Diagnosis not present

## 2022-12-19 DIAGNOSIS — I679 Cerebrovascular disease, unspecified: Secondary | ICD-10-CM | POA: Diagnosis not present

## 2022-12-19 DIAGNOSIS — E66811 Obesity, class 1: Secondary | ICD-10-CM

## 2022-12-19 DIAGNOSIS — Z1231 Encounter for screening mammogram for malignant neoplasm of breast: Secondary | ICD-10-CM

## 2022-12-19 LAB — LIPID PANEL
Cholesterol: 231 mg/dL — ABNORMAL HIGH (ref 0–200)
HDL: 59.8 mg/dL (ref >39.00)
LDL Cholesterol: 143 mg/dL — ABNORMAL HIGH (ref 0–99)
NonHDL: 170.9
Total CHOL/HDL Ratio: 4
Triglycerides: 142 mg/dL (ref 0.0–149.0)
VLDL: 28.4 mg/dL (ref 0.0–40.0)

## 2022-12-19 LAB — CBC WITH DIFFERENTIAL/PLATELET
Basophils Absolute: 0.1 10*3/uL (ref 0.0–0.1)
Basophils Relative: 0.7 % (ref 0.0–3.0)
Eosinophils Absolute: 0.2 10*3/uL (ref 0.0–0.7)
Eosinophils Relative: 1.8 % (ref 0.0–5.0)
HCT: 44.9 % (ref 36.0–46.0)
Hemoglobin: 14.6 g/dL (ref 12.0–15.0)
Lymphocytes Relative: 20 % (ref 12.0–46.0)
Lymphs Abs: 1.7 10*3/uL (ref 0.7–4.0)
MCHC: 32.6 g/dL (ref 30.0–36.0)
MCV: 97.4 fL (ref 78.0–100.0)
Monocytes Absolute: 0.6 10*3/uL (ref 0.1–1.0)
Monocytes Relative: 7.3 % (ref 3.0–12.0)
Neutro Abs: 6.2 10*3/uL (ref 1.4–7.7)
Neutrophils Relative %: 70.2 % (ref 43.0–77.0)
Platelets: 372 10*3/uL (ref 150.0–400.0)
RBC: 4.61 Mil/uL (ref 3.87–5.11)
RDW: 12.5 % (ref 11.5–15.5)
WBC: 8.8 10*3/uL (ref 4.0–10.5)

## 2022-12-19 LAB — COMPREHENSIVE METABOLIC PANEL
ALT: 9 U/L (ref 0–35)
AST: 19 U/L (ref 0–37)
Albumin: 3.8 g/dL (ref 3.5–5.2)
Alkaline Phosphatase: 70 U/L (ref 39–117)
BUN: 19 mg/dL (ref 6–23)
CO2: 31 meq/L (ref 19–32)
Calcium: 9.1 mg/dL (ref 8.4–10.5)
Chloride: 102 meq/L (ref 96–112)
Creatinine, Ser: 0.68 mg/dL (ref 0.40–1.20)
GFR: 81.99 mL/min (ref >60.00)
Glucose, Bld: 94 mg/dL (ref 70–99)
Potassium: 4.6 meq/L (ref 3.5–5.1)
Sodium: 139 meq/L (ref 135–145)
Total Bilirubin: 0.5 mg/dL (ref 0.2–1.2)
Total Protein: 6 g/dL (ref 6.0–8.3)

## 2022-12-19 LAB — TSH: TSH: 2.1 u[IU]/mL (ref 0.35–5.50)

## 2022-12-19 LAB — VITAMIN D 25 HYDROXY (VIT D DEFICIENCY, FRACTURES): VITD: 114.95 ng/mL (ref 30.00–100.00)

## 2022-12-19 MED ORDER — METOPROLOL SUCCINATE ER 25 MG PO TB24: 25.0000 mg | ORAL_TABLET | Freq: Every day | ORAL | 1 refills | Status: DC

## 2022-12-19 NOTE — Progress Notes (Unsigned)
Patient ID: Gerrit Friends, female    DOB: [birth year]  Age: 82 y.o. MRN: [id number]  The patient is here for annual preventive examination and management of other chronic and acute problems.   The risk factors are reflected in the social history.  The roster of all physicians providing medical care to patient - is listed in the Snapshot section of the chart.  Activities of daily living:  The patient is 100% independent in all ADLs: dressing, toileting, feeding as well as independent mobility  Home safety : The patient has smoke detectors in the home. They wear seatbelts.  There are no firearms at home. There is no violence in the home.   There is no risks for hepatitis, STDs or HIV. There is no   history of blood transfusion. They have no travel history to infectious disease endemic areas of the world.  The patient has seen their dentist in the last six month. They have seen their eye doctor in the last year. They admit to slight hearing difficulty with regard to whispered voices and some television programs.  They have deferred audiologic testing in the last year.  They do not  have excessive sun exposure. Discussed the need for sun protection: hats, long sleeves and use of sunscreen if there is significant sun exposure.   Diet: the importance of a healthy diet is discussed. They do have a healthy diet.  The benefits of regular aerobic exercise were discussed. She walks 4 times per week ,  20 minutes.   Depression screen: there are no signs or vegative symptoms of depression- irritability, change in appetite, anhedonia, sadness/tearfullness.  Cognitive assessment: the patient manages all their financial and personal affairs and is actively engaged. They could relate day,date,year and events; recalled 2/3 objects at 3 minutes; performed clock-face test normally.  The following portions of the patient's history were reviewed and updated as appropriate: allergies, current medications, past  family history, past medical history,  past surgical history, past social history  and problem list.  Visual acuity was not assessed per patient preference since she has regular follow up with her ophthalmologist. Hearing and body mass index were assessed and reviewed.   During the course of the visit the patient was educated and counseled about appropriate screening and preventive services including : fall prevention , diabetes screening, nutrition counseling, colorectal cancer screening, and recommended immunizations.    CC: The primary encounter diagnosis was Need for influenza vaccination. Diagnoses of Vitamin D deficiency, Erythrocytosis, Hypothyroidism due to acquired atrophy of thyroid, Pure hypercholesterolemia, Essential hypertension, Encounter for preventive care, Breast cancer screening by mammogram, Cough in adult patient, Hypervitaminosis D, Class 1 obesity due to excess calories with serious comorbidity and body mass index (BMI) of 30.0 to 30.9 in adult, Loss of perception for taste, Cerebrovascular small vessel disease, and Aortic atherosclerosis (HCC) were also pertinent to this visit.  S/p  left  hip replaced in June 2024.  Recovery was complicated by an episode of severe femur pain (not dislocated per urgent ortho eval),  followed by a superficial wound infection attributed to one of the stitches (?)  which was treated to resolution with  oral antibiotics  for 2 weeks.  She has been  released by [facility]  nov 11  . Recovery was slowed ,  required prolonged   PT , now released   advised to continue aquatic therapy . Marland Kitchen  Currently not pain free but tolerable   She has had a PERSISTENT COUGH SINCE  HER SURGERY.  The cough is INTERMITTENT, but does not occur when supine , no fevers,  shortness of breath    History Tavi has a past medical history of Hyperlipidemia, Hypertension, and Hypothyroidism.   She has a past surgical history that includes Cardiac catheterization; Tonsillectomy;  Partial hysterectomy; Abdominal hysterectomy; Cholecystectomy (N/A, 08/21/2015); Eye surgery; Knee arthroscopy (Right, 08/28/2018); right hip replacement (Right, 2017); Replacement total knee (Right, 2021); and Total hip arthroplasty (Left).   Her family history includes Cancer in her father; Heart disease in her mother; Hypothyroidism in her mother.She reports that she quit smoking about 34 years ago. Her smoking use included cigarettes. She started smoking about 54 years ago. She has never used smokeless tobacco. She reports current alcohol use of about 1.0 standard drink of alcohol per week. She reports that she does not use drugs.  Outpatient Medications Prior to Visit  Medication Sig Dispense Refill   estradiol (ESTRACE) 2 MG tablet TAKE 1 TABLET(2 MG) BY MOUTH DAILY 30 tablet 0   levothyroxine (SYNTHROID) 100 MCG tablet TAKE 1 TABLET(100 MCG) BY MOUTH DAILY 90 tablet 3   metoprolol succinate (TOPROL-XL) 25 MG 24 hr tablet Take 1 tablet (25 mg total) by mouth daily. 90 tablet 1   Multiple Vitamin (MULTIVITAMIN) tablet Take 1 tablet by mouth daily.     tiZANidine (ZANAFLEX) 2 MG tablet Take 1 tablet (2 mg total) by mouth every 6 (six) hours as needed for muscle spasms. 60 tablet 1   No facility-administered medications prior to visit.    Review of Systems  Patient denies headache, fevers, malaise, unintentional weight loss, skin rash, eye pain, sinus congestion and sinus pain, sore throat, dysphagia,  hemoptysis , dyspnea, wheezing, chest pain, palpitations, orthopnea, edema, abdominal pain, nausea, melena, diarrhea, constipation, flank pain, dysuria, hematuria, urinary  Frequency, nocturia, numbness, tingling, seizures,  Focal weakness, Loss of consciousness,  Tremor, insomnia, depression, anxiety, and suicidal ideation.     Objective:  BP 128/74   Pulse 60   Temp 97.9 F (36.6 C) (Oral)   Ht 5\' 2"  (1.575 m)   Wt 169 lb (76.7 kg)   SpO2 97%   BMI 30.91 kg/m   Physical Exam Vitals  reviewed.  Constitutional:      General: She is not in acute distress.    Appearance: Normal appearance. She is normal weight. She is not ill-appearing, toxic-appearing or diaphoretic.  HENT:     Head: Normocephalic.  Eyes:     General: No scleral icterus.       Right eye: No discharge.        Left eye: No discharge.     Conjunctiva/sclera: Conjunctivae normal.  Cardiovascular:     Rate and Rhythm: Normal rate and regular rhythm.     Heart sounds: Normal heart sounds.  Pulmonary:     Effort: Pulmonary effort is normal. No respiratory distress.     Breath sounds: Normal breath sounds.  Musculoskeletal:        General: Normal range of motion.  Skin:    General: Skin is warm and dry.  Neurological:     General: No focal deficit present.     Mental Status: She is alert and oriented to person, place, and time. Mental status is at baseline.  Psychiatric:        Mood and Affect: Mood normal.        Behavior: Behavior normal.        Thought Content: Thought content normal.  Judgment: Judgment normal.      Assessment & Plan:  Need for influenza vaccination -     Flu Vaccine Trivalent High Dose (Fluad)  Vitamin D deficiency -     VITAMIN D 25 Hydroxy (Vit-D Deficiency, Fractures)  Erythrocytosis -     CBC with Differential/Platelet  Hypothyroidism due to acquired atrophy of thyroid Assessment & Plan: Thyroid function is WNL on current  100 mcg levothyroxine dose.  No current changes needed.    Lab Results  Component Value Date   TSH 2.10 12/19/2022     Orders: -     TSH  Pure hypercholesterolemia -     Lipid panel  Essential hypertension Assessment & Plan: Well controlled on current regimen. Renal function stable, no changes today. Continue  Toprol  XL given history of CAD .  Orders: -     Comprehensive metabolic panel  Encounter for preventive care Assessment & Plan: age appropriate education and counseling updated, referrals for preventative services  and immunizations addressed, dietary and smoking counseling addressed, most recent labs reviewed.  I have personally reviewed and have noted:   1) the patient's medical and social history 2) The pt's use of alcohol, tobacco, and illicit drugs 3) The patient's current medications and supplements 4) Functional ability including ADL's, fall risk, home safety risk, hearing and visual impairment 5) Diet and physical activities 6) Evidence for depression or mood disorder 7) The patient's height, weight, and BMI have been recorded in the chart     I have made referrals, and provided counseling and education based on review of the above.  SHE DECLINES DEXA SCAN    Breast cancer screening by mammogram -     3D Screening Mammogram, Left and Right; Future  Cough in adult patient Assessment & Plan: Lug exam is normal ,  chest x ray ordered  and reviewed by appears to have no acute changes and  was compared to 2021 chest x ray   Orders: -     DG Chest 2 View; Future  Hypervitaminosis D Assessment & Plan: Asymptomatic .   Advised to suspend all supplements.  Recheck one month   Orders: -     VITAMIN D 25 Hydroxy (Vit-D Deficiency, Fractures); Future  Class 1 obesity due to excess calories with serious comorbidity and body mass index (BMI) of 30.0 to 30.9 in adult Assessment & Plan: ? 30 she has achieved an intentional  wt loss35 lbs >  of  since Feb 2019.  following a low GI diet , BMI reduced from 37 to 30.9   Loss of perception for taste  Cerebrovascular small vessel disease Assessment & Plan: Found on MRI of brain d 2019.  She is statin intolerant due ot severe myalgias with rosuvastatin trial    Aortic atherosclerosis (HCC) Assessment & Plan: Untreated due to statin myopathy    Other orders -     Metoprolol Succinate ER; Take 1 tablet (25 mg total) by mouth daily.  Dispense: 90 tablet; Refill: 1 -     Levothyroxine Sodium; Take 1 tablet (100 mcg total) by mouth daily before  breakfast.  Dispense: 90 tablet; Refill: 3      I provided 40 minutes of  face-to-face time during this encounter reviewing patient's current problems and past surgeries,  recent labs and imaging studies, providing counseling on the above mentioned problems , and coordination  of care .   Follow-up: Return in about 6 months (around 06/18/2023) for hypertension.   Rosey Bath  Quintella Baton, MD

## 2022-12-19 NOTE — Assessment & Plan Note (Signed)

## 2022-12-20 DIAGNOSIS — R059 Cough, unspecified: Secondary | ICD-10-CM | POA: Insufficient documentation

## 2022-12-20 DIAGNOSIS — E673 Hypervitaminosis D: Secondary | ICD-10-CM | POA: Insufficient documentation

## 2022-12-20 MED ORDER — LEVOTHYROXINE SODIUM 100 MCG PO TABS: 100.0000 ug | ORAL_TABLET | Freq: Every day | ORAL | 3 refills | Status: DC

## 2022-12-20 NOTE — Assessment & Plan Note (Signed)
Well controlled on current regimen. Renal function stable, no changes today. Continue  Toprol  XL given history of CAD . 

## 2022-12-20 NOTE — Assessment & Plan Note (Signed)
Thyroid function is WNL on current  100 mcg levothyroxine dose.  No current changes needed.    Lab Results  Component Value Date   TSH 2.10 12/19/2022

## 2022-12-20 NOTE — Assessment & Plan Note (Signed)
Asymptomatic .   Advised to suspend all supplements.  Recheck one month

## 2022-12-20 NOTE — Assessment & Plan Note (Signed)
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

## 2022-12-20 NOTE — Assessment & Plan Note (Signed)
Found on MRI of brain d 2019.  She is statin intolerant due ot severe myalgias with rosuvastatin trial

## 2022-12-20 NOTE — Assessment & Plan Note (Addendum)
Lug exam is normal ,  chest x ray ordered  and reviewed by appears to have no acute changes and  was compared to 2021 chest x ray

## 2022-12-20 NOTE — Assessment & Plan Note (Signed)
?   30 she has achieved an intentional  wt loss35 lbs >  of  since Feb 2019.  following a low GI diet , BMI reduced from 37 to 30.9

## 2023-01-07 ENCOUNTER — Telehealth: Payer: Self-pay

## 2023-01-07 NOTE — Telephone Encounter (Signed)
Pt called back and I read the note and she verbalized understanding

## 2023-01-07 NOTE — Telephone Encounter (Signed)
Lvm for pt to give office a call back in regards to chest xray

## 2023-01-07 NOTE — Telephone Encounter (Signed)
noted 

## 2023-01-07 NOTE — Telephone Encounter (Signed)
-----   Message from Meagan Cox sent at 01/04/2023  9:48 PM EST ----- CXR is normal

## 2023-01-10 ENCOUNTER — Other Ambulatory Visit: Payer: Self-pay | Admitting: Internal Medicine

## 2023-01-11 ENCOUNTER — Other Ambulatory Visit: Payer: Self-pay | Admitting: Internal Medicine

## 2023-01-18 ENCOUNTER — Other Ambulatory Visit (INDEPENDENT_AMBULATORY_CARE_PROVIDER_SITE_OTHER): Payer: PPO

## 2023-01-18 DIAGNOSIS — E673 Hypervitaminosis D: Secondary | ICD-10-CM

## 2023-01-18 LAB — VITAMIN D 25 HYDROXY (VIT D DEFICIENCY, FRACTURES): VITD: 84.23 ng/mL (ref 30.00–100.00)

## 2023-01-30 ENCOUNTER — Encounter: Payer: Self-pay | Admitting: Internal Medicine

## 2023-01-31 MED ORDER — ESTRADIOL 2 MG PO TABS: ORAL_TABLET | ORAL | 0 refills | Status: DC

## 2023-05-01 ENCOUNTER — Other Ambulatory Visit: Payer: Self-pay | Admitting: Internal Medicine

## 2023-05-20 DIAGNOSIS — Z96642 Presence of left artificial hip joint: Secondary | ICD-10-CM | POA: Diagnosis not present

## 2023-06-05 ENCOUNTER — Encounter: Payer: Self-pay | Admitting: Physical Therapy

## 2023-06-05 ENCOUNTER — Ambulatory Visit: Attending: Cardiology | Admitting: Physical Therapy

## 2023-06-05 DIAGNOSIS — R6889 Other general symptoms and signs: Secondary | ICD-10-CM | POA: Insufficient documentation

## 2023-06-05 DIAGNOSIS — M25652 Stiffness of left hip, not elsewhere classified: Secondary | ICD-10-CM | POA: Insufficient documentation

## 2023-06-05 DIAGNOSIS — M6281 Muscle weakness (generalized): Secondary | ICD-10-CM | POA: Insufficient documentation

## 2023-06-05 DIAGNOSIS — R269 Unspecified abnormalities of gait and mobility: Secondary | ICD-10-CM | POA: Insufficient documentation

## 2023-06-05 DIAGNOSIS — Z96642 Presence of left artificial hip joint: Secondary | ICD-10-CM | POA: Insufficient documentation

## 2023-06-05 DIAGNOSIS — M25552 Pain in left hip: Secondary | ICD-10-CM | POA: Insufficient documentation

## 2023-06-05 NOTE — Therapy (Signed)
 OUTPATIENT PHYSICAL THERAPY LOWER EXTREMITY EVALUATION   Patient Name: Meagan Cox MRN: [id number] DOB:03/14/1940, 83 y.o., female Today's Date: 06/05/2023  END OF SESSION:  PT End of Session - 06/05/23 1231     Visit Number 1    Number of Visits 16    Date for PT Re-Evaluation 07/31/23    PT Start Time 1027    PT Stop Time 1120    PT Time Calculation (min) 53 min    Activity Tolerance Patient tolerated treatment well    Behavior During Therapy WFL for tasks assessed/performed            Past Medical History:  Diagnosis Date   Hyperlipidemia    Hypertension    Hypothyroidism    Past Surgical History:  Procedure Laterality Date   ABDOMINAL HYSTERECTOMY     CARDIAC CATHETERIZATION     30 years ago, [place]   CHOLECYSTECTOMY N/A 08/21/2015   Procedure: LAPAROSCOPIC CHOLECYSTECTOMY;  Surgeon: Alben Alma, MD;  Location: [facility];  Service: General;  Laterality: N/A;   EYE SURGERY     eye lid lift   KNEE ARTHROSCOPY Right 08/28/2018   Procedure: ARTHROSCOPY KNEE;  Surgeon: Jerlyn Moons, MD;  Location: [facility];  Service: Orthopedics;  Laterality: Right;   PARTIAL HYSTERECTOMY     REPLACEMENT TOTAL KNEE Right 2021   Bowers   right hip replacement Right 2017   Bowers   TONSILLECTOMY     TOTAL HIP ARTHROPLASTY Left    Pt reported left hip replacement done in June   Patient Active Problem List   Diagnosis Date Noted   Hypervitaminosis D 12/20/2022   Cough in adult patient 12/20/2022   Encounter for preventive care 12/30/2021   Asymptomatic postmenopausal estrogen deficiency 12/28/2021   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   Aortic atherosclerosis (HCC) 11/05/2020   Postsurgical menopause 06/09/2020   Breast cancer screening 05/04/2020   Loss of perception for taste 10/19/2017   Essential hypertension 03/05/2017   S/P laparoscopic cholecystectomy 03/05/2017   Ptosis, both eyelids 01/04/2017   Complete tear, knee, anterior cruciate ligament  08/28/2016   History of total hip arthroplasty 08/28/2016   Tear of meniscus of knee 08/28/2016   Medicare annual wellness visit, subsequent 07/24/2015   Cerebrovascular small vessel disease 04/24/2015   Benign paroxysmal positional vertigo 04/24/2015   Edema 05/14/2012   Polyarthritis of ankle 04/08/2011   Hypothyroidism 12/18/2010   Low back pain radiating to both legs 12/18/2010   Chronic right hip pain 12/18/2010   Obesity 05/09/2010   CAD (coronary artery disease) 05/09/2010   Hyperlipidemia 05/09/2010   PCP: Thersia Flax, MD  REFERRING PROVIDER: Tara Fanti., MD  REFERRING DIAG: [phone] (ICD-10-CM) - Presence of left artificial hip joint   THERAPY DIAG:  Pain in left hip  Stiffness of left hip, not elsewhere classified  Gait difficulty  Muscle weakness (generalized)  Decreased activity tolerance  Status post total replacement of left hip  Rationale for Evaluation and Treatment: Rehabilitation  ONSET DATE: 09/07/2022  SUBJECTIVE:   SUBJECTIVE STATEMENT: Pt reports to PT with c/c of L lateral hip/ piriformis pain with increase walking/ climbing stairs/ household tasks/ sleeping position.  Pt had L hip replaced on 07/12/2022, which subsequently got infected on 08/01/2022. Pt. Worked hard with PT in past and returns today with increase c/o L hip/piriformis pain symptoms.  Pt. Prescribed Celebrex  but has not started medication. She denies any numbness/tingling or clicking/popping.   PERTINENT HISTORY: Hx of bilateral  THA (2017), R TKA (2021), CAD, HTN  PAIN:  Are you having pain? Yes: NPRS scale: 3/10 currently, >7/10 at worst Pain location: L lateral hip Pain description: sore/ tight Aggravating factors: Walking Relieving factors: Rest, wine  PRECAUTIONS: None  RED FLAGS: None   WEIGHT BEARING RESTRICTIONS: No  FALLS:  Has patient fallen in last 6 months? No falls but several near falls where pt describes losing her balance with forward  leaning  LIVING ENVIRONMENT: Lives with: lives with their daughter Lives in: House/apartment Stairs: Yes: External: 3 steps; can reach both Has following equipment at home: Retail banker - 2 wheeled  OCCUPATION: Retired  PLOF: Independent with household mobility with device   PATIENT GOALS: Get back to walking without pain, get ready for 1 month [place] cruise in December   NEXT MD VISIT: 3rd week of June  OBJECTIVE:   DIAGNOSTIC FINDINGS: N/A  PATIENT SURVEYS:  LEFS: 29 out of 54  COGNITION: Overall cognitive status: Within functional limits for tasks assessed     SENSATION: WFL  MUSCLE LENGTH: Hamstrings: Right 150 deg; Left 150 deg  POSTURE: No Significant postural limitations  LEG LENGTH: 34 inches bilaterally for true leg length (ASIS to medial malleolus)  LOWER EXTREMITY ROM:  Active ROM Right eval Left eval  Hip flexion 115 110  Hip extension 12 8  Hip abduction 32 21  Hip adduction    Hip internal rotation    Hip external rotation    Knee flexion [facility] WFL  Knee extension [facility] WFL  Ankle dorsiflexion WFL WFL   (Blank rows = not tested)  LOWER EXTREMITY MMT:  MMT Right eval Left eval  Hip flexion 3+ 3   Hip extension 4 3+  Hip abduction 4 3  Hip adduction    Hip internal rotation 3+ 3+  Hip external rotation 4- 3+  Knee flexion 5 5  Knee extension 5 5  Ankle dorsiflexion    Ankle plantarflexion    Ankle inversion    Ankle eversion     (Blank rows = not tested)  LOWER EXTREMITY SPECIAL TESTS:  Hip special tests: Portia Brittle (FABER) test: positive on L and (FADIR) test: negative bilaterally  FUNCTIONAL TESTS:  5 times sit to stand: 22.58 seconds (no UE assist).  L hip IR/ foot IV with STS and while sitting in recliner (reported per pt.).    GAIT: Distance walked: in clinic Assistive device utilized: Quad cane small base Level of assistance: Complete Independence Comments: Antalgic gait, (+) trendelenburg noted on  L side.  Increase L hip/ piriformis pain with recip. Stair climbing, esp. Descending.  Pt. Prefers lateral step pattern while descending.     TODAY'S TREATMENT:                                                                                                                              DATE: 06/05/2023  EVALUATION/ See HEP    PATIENT EDUCATION:  Education details: Pt educated on HEP form and frequency, role of PT going forward Person educated: Patient Education method: Explanation, Demonstration, and Handouts Education comprehension: verbalized understanding and returned demonstration  HOME EXERCISE PROGRAM: Access Code: [email or web address] URL: [email or web address] Date: 06/05/2023 Prepared by: Hazeline Lister  Exercises - Supine Hamstring Stretch  - 2 x daily - 7 x weekly - 1 sets - 3 reps - Supine Figure 4 Piriformis Stretch  - 2 x daily - 7 x weekly - 1 sets - 3 reps - 20 seconds hold - Supine Piriformis Stretch with Leg Straight  - 2 x daily - 7 x weekly - 1 sets - 3 reps - 20 seconds hold - Supine Bridge  - 2 x daily - 4-5 x weekly - 2 sets - 10 reps - Seated Piriformis Stretch  - 2 x daily - 7 x weekly - 1 sets - 3 reps - 20 seconds hold - Sit to Stand  - 2 x daily - 4-5 x weekly - 2 sets - 10 reps  ASSESSMENT:  CLINICAL IMPRESSION: Patient is a 83 y.o. female who was seen today for physical therapy evaluation and treatment for L lateral hip pain. Pt is limited at this time due to the following impairments: decreased hip musculature strength bilaterally (L > R), decreased L hip ROM, tightness of bilateral hamstrings and piriformis/glute musculature, as well as pain. Pt's pain is currently limiting her activity tolerance and function at home. Pt tested positive for FABER test on the L side, raising the likelihood that her symptoms are consisted with greater trochanteric pain syndrome/bursitis. Pt will benefit from continued PT services upon discharge to safely address deficits  listed in patient problem list for decreased caregiver assistance and eventual return to PLOF.   OBJECTIVE IMPAIRMENTS: Abnormal gait, difficulty walking, decreased ROM, decreased strength, impaired flexibility, and pain.   ACTIVITY LIMITATIONS: bending, standing, squatting, and locomotion level  PARTICIPATION LIMITATIONS: cleaning, laundry, community activity, and yard work  PERSONAL FACTORS: Age, Time since onset of injury/illness/exacerbation, and 3+ comorbidities: Hx of bilateral THA, hx of R TKA, CAD, HTN are also affecting patient's functional outcome.   REHAB POTENTIAL: Good  CLINICAL DECISION MAKING: Stable/uncomplicated  EVALUATION COMPLEXITY: Low   GOALS:  SHORT TERM GOALS: Target date: 07/03/2023 Pt will be independent and compliant with HEP in order to improve bilateral distal hamstring length by at least 10 degrees for decreased L hip pain. Baseline: R/L 90-90 hamstring length = 150 degrees Goal status: INITIAL   LONG TERM GOALS: Target date: 07/31/2023  Pt will increase LEFS to >50 in order to demonstrate subjective improvement in pain and ADL performance. Baseline: Initial 29 Goal status: INITIAL  2.  Pt will increase L hip AROM by at least 10 degrees in all planes to improve function with squatting/bending activities. Baseline: See above for initial ROM measurements  Goal status: INITIAL  3.  Pt will improve bilateral hip MMT scores to at least 4/5 in order to show improved hip musculature strength needed for improved walking/activity tolerance. Baseline: See above for initial MMT scores Goal status: INITIAL  4.  Pt will decrease 5TSTS by at least 3 seconds in order to demonstrate clinically significant improvement in LE strength. Baseline: see above Goal status: INITIAL  5.  Pt will report decrease pain by at least 3 points on NPS scale with walking in order to show improved walking tolerance. Baseline: >6/10 NPS scale with walking on initial evaluation Goal  status: INITIAL  6.  Pt will be  able to climb ladder with no hip limitations in order to be able to go on Cruise ship at end of year.   Baseline: unable Goal status: INITIAL   PLAN:  PT FREQUENCY: 2x/week  PT DURATION: 8 weeks  PLANNED INTERVENTIONS: Therapeutic exercises, Therapeutic activity, Neuromuscular re-education, Balance training, Self Care, Joint mobilization, Cryotherapy, Moist heat, Manual therapy, and Re-evaluation  PLAN FOR NEXT SESSION: Review HEP, introduce hip strengthening exercises (ankle weights, banded resistance, resisted gait), LE stretching  Lendell Quarry, PT, DPT # [phone] 06/05/2023, 12:33 PM

## 2023-06-11 ENCOUNTER — Encounter: Payer: Self-pay | Admitting: Physical Therapy

## 2023-06-11 ENCOUNTER — Ambulatory Visit: Admitting: Physical Therapy

## 2023-06-11 DIAGNOSIS — Z96642 Presence of left artificial hip joint: Secondary | ICD-10-CM

## 2023-06-11 DIAGNOSIS — R6889 Other general symptoms and signs: Secondary | ICD-10-CM

## 2023-06-11 DIAGNOSIS — M25552 Pain in left hip: Secondary | ICD-10-CM | POA: Diagnosis not present

## 2023-06-11 DIAGNOSIS — R269 Unspecified abnormalities of gait and mobility: Secondary | ICD-10-CM

## 2023-06-11 DIAGNOSIS — M25652 Stiffness of left hip, not elsewhere classified: Secondary | ICD-10-CM

## 2023-06-11 DIAGNOSIS — M6281 Muscle weakness (generalized): Secondary | ICD-10-CM

## 2023-06-11 NOTE — Therapy (Signed)
 OUTPATIENT PHYSICAL THERAPY LOWER EXTREMITY TREATMENT  Patient Name: Meagan Cox MRN: [id number] DOB:[birth year], 83 y.o., female Today's Date: 06/11/2023  END OF SESSION:  PT End of Session - 06/11/23 1117     Visit Number 2    Number of Visits 16    Date for PT Re-Evaluation 07/31/23    PT Start Time 1114    PT Stop Time 1157    PT Time Calculation (min) 43 min    Activity Tolerance Patient tolerated treatment well    Behavior During Therapy WFL for tasks assessed/performed            Past Medical History:  Diagnosis Date   Hyperlipidemia    Hypertension    Hypothyroidism    Past Surgical History:  Procedure Laterality Date   ABDOMINAL HYSTERECTOMY     CARDIAC CATHETERIZATION     30 years ago, [place]   CHOLECYSTECTOMY N/A 08/21/2015   Procedure: LAPAROSCOPIC CHOLECYSTECTOMY;  Surgeon: Alben Alma, MD;  Location: [facility];  Service: General;  Laterality: N/A;   EYE SURGERY     eye lid lift   KNEE ARTHROSCOPY Right 08/28/2018   Procedure: ARTHROSCOPY KNEE;  Surgeon: Jerlyn Moons, MD;  Location: [facility];  Service: Orthopedics;  Laterality: Right;   PARTIAL HYSTERECTOMY     REPLACEMENT TOTAL KNEE Right 2021   Bowers   right hip replacement Right 2017   [facility]ers   TONSILLECTOMY     TOTAL HIP ARTHROPLASTY Left    Pt reported left hip replacement done in June   Patient Active Problem List   Diagnosis Date Noted   Hypervitaminosis D 12/20/2022   Cough in adult patient 12/20/2022   Encounter for preventive care 12/30/2021   Asymptomatic postmenopausal estrogen deficiency 12/28/2021   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   Aortic atherosclerosis (HCC) 11/05/2020   Postsurgical menopause 06/09/2020   Breast cancer screening 05/04/2020   Loss of perception for taste 10/19/2017   Essential hypertension 03/05/2017   S/P laparoscopic cholecystectomy 03/05/2017   Ptosis, both eyelids 01/04/2017   Complete tear, knee, anterior cruciate ligament  08/28/2016   History of total hip arthroplasty 08/28/2016   Tear of meniscus of knee 08/28/2016   Medicare annual wellness visit, subsequent 07/24/2015   Cerebrovascular small vessel disease 04/24/2015   Benign paroxysmal positional vertigo 04/24/2015   Edema 05/14/2012   Polyarthritis of ankle 04/08/2011   Hypothyroidism 12/18/2010   Low back pain radiating to both legs 12/18/2010   Chronic right hip pain 12/18/2010   Obesity 05/09/2010   CAD (coronary artery disease) 05/09/2010   Hyperlipidemia 05/09/2010   PCP: Thersia Flax, MD  REFERRING PROVIDER: Tara Fanti., MD  REFERRING DIAG: [phone] (ICD-10-CM) - Presence of left artificial hip joint   THERAPY DIAG:  Pain in left hip  Stiffness of left hip, not elsewhere classified  Gait difficulty  Muscle weakness (generalized)  Decreased activity tolerance  Status post total replacement of left hip  Rationale for Evaluation and Treatment: Rehabilitation  ONSET DATE: 09/07/2022  SUBJECTIVE:   SUBJECTIVE STATEMENT: Pt reports to PT with c/c of L lateral hip/ piriformis pain with increase walking/ climbing stairs/ household tasks/ sleeping position.  Pt had L hip replaced on 07/12/2022, which subsequently got infected on 08/01/2022. Pt. Worked hard with PT in past and returns today with increase c/o L hip/piriformis pain symptoms.  Pt. Prescribed Celebrex  but has not started medication. She denies any numbness/tingling or clicking/popping.   PERTINENT HISTORY: Hx of bilateral THA (  2017), R TKA (2021), CAD, HTN  PAIN:  Are you having pain? Yes: NPRS scale: 3/10 currently, >7/10 at worst Pain location: L lateral hip Pain description: sore/ tight Aggravating factors: Walking Relieving factors: Rest, wine  PRECAUTIONS: None  RED FLAGS: None   WEIGHT BEARING RESTRICTIONS: No  FALLS:  Has patient fallen in last 6 months? No falls but several near falls where pt describes losing her balance with forward  leaning  LIVING ENVIRONMENT: Lives with: lives with their daughter Lives in: House/apartment Stairs: Yes: External: 3 steps; can reach both Has following equipment at home: Retail banker - 2 wheeled  OCCUPATION: Retired  PLOF: Independent with household mobility with device   PATIENT GOALS: Get back to walking without pain, get ready for 1 month [place] cruise in December   NEXT MD VISIT: 3rd week of June  OBJECTIVE:   DIAGNOSTIC FINDINGS: N/A  PATIENT SURVEYS:  LEFS: 29 out of 60  COGNITION: Overall cognitive status: Within functional limits for tasks assessed     SENSATION: WFL  MUSCLE LENGTH: Hamstrings: Right 150 deg; Left 150 deg  POSTURE: No Significant postural limitations  LEG LENGTH: 34 inches bilaterally for true leg length (ASIS to medial malleolus)  LOWER EXTREMITY ROM:  Active ROM Right eval Left eval  Hip flexion 115 110  Hip extension 12 8  Hip abduction 32 21  Hip adduction    Hip internal rotation    Hip external rotation    Knee flexion [facility] WFL  Knee extension [facility] WFL  Ankle dorsiflexion WFL WFL   (Blank rows = not tested)  LOWER EXTREMITY MMT:  MMT Right eval Left eval  Hip flexion 3+ 3   Hip extension 4 3+  Hip abduction 4 3  Hip adduction    Hip internal rotation 3+ 3+  Hip external rotation 4- 3+  Knee flexion 5 5  Knee extension 5 5  Ankle dorsiflexion    Ankle plantarflexion    Ankle inversion    Ankle eversion     (Blank rows = not tested)  LOWER EXTREMITY SPECIAL TESTS:  Hip special tests: Portia Brittle (FABER) test: positive on L and (FADIR) test: negative bilaterally  FUNCTIONAL TESTS:  5 times sit to stand: 22.58 seconds (no UE assist).  L hip IR/ foot IV with STS and while sitting in recliner (reported per pt.).    GAIT: Distance walked: in clinic Assistive device utilized: Quad cane small base Level of assistance: Complete Independence Comments: Antalgic gait, (+) trendelenburg noted on  L side.  Increase L hip/ piriformis pain with recip. Stair climbing, esp. Descending.  Pt. Prefers lateral step pattern while descending.     TODAY'S TREATMENT:                                                                                                                              DATE: 06/11/2023  Subjective:  Pt. Reports "toothache" discomfort in L anterior  hip while walking into PT clinic.  Pt. Reports compliance with HEP over weekend.  Pt. Reports no hip discomfort while on Nustep.  Pt. Discussed focal area over L anterior hip that is pain limited with walking/ tender with palpation.    There.ex.:  Nustep L3 5 min. B UE/LE (warm-up).  Discussed L anterior hip muscle mass/ h/o of muscle strain.    Supine marching 10x/ hip adduction with ball 10x/ hip abduction with GTB 10x/ hip flexion with GTB 10x each.    Step ups/downs on 6" step (pain limited on L).  Ascending/descending stairs with recip. Pattern and B UE assist on handrails (pain limited on L).    Manual tx.:  Supine LE generalized stretches with focus on hamstring/ piriformis.  L hip ER static stretches with holds (as tolerated)- 3x.    Reviewed HEP    PATIENT EDUCATION:  Education details: Pt educated on HEP form and frequency, role of PT going forward Person educated: Patient Education method: Explanation, Demonstration, and Handouts Education comprehension: verbalized understanding and returned demonstration  HOME EXERCISE PROGRAM: Access Code: [email or web address] URL: [email or web address] Date: 06/05/2023 Prepared by: Hazeline Lister  Exercises - Supine Hamstring Stretch  - 2 x daily - 7 x weekly - 1 sets - 3 reps - Supine Figure 4 Piriformis Stretch  - 2 x daily - 7 x weekly - 1 sets - 3 reps - 20 seconds hold - Supine Piriformis Stretch with Leg Straight  - 2 x daily - 7 x weekly - 1 sets - 3 reps - 20 seconds hold - Supine Bridge  - 2 x daily - 4-5 x weekly - 2 sets - 10 reps - Seated Piriformis Stretch  -  2 x daily - 7 x weekly - 1 sets - 3 reps - 20 seconds hold - Sit to Stand  - 2 x daily - 4-5 x weekly - 2 sets - 10 reps  ASSESSMENT:  CLINICAL IMPRESSION: Pt. Presents with decreased hip strength bilaterally (L > R), decreased L hip ROM, tightness of bilateral hamstrings and piriformis/glute musculature, as well as pain. Pt's pain is currently limiting her activity tolerance and function at home. Pt tested positive for FABER test on the L side, raising the likelihood that her symptoms are consisted with greater trochanteric pain syndrome/bursitis. Pt will benefit from continued PT services upon discharge to safely address deficits listed in patient problem list for decreased caregiver assistance and eventual return to PLOF.  OBJECTIVE IMPAIRMENTS: Abnormal gait, difficulty walking, decreased ROM, decreased strength, impaired flexibility, and pain.   ACTIVITY LIMITATIONS: bending, standing, squatting, and locomotion level  PARTICIPATION LIMITATIONS: cleaning, laundry, community activity, and yard work  PERSONAL FACTORS: Age, Time since onset of injury/illness/exacerbation, and 3+ comorbidities: Hx of bilateral THA, hx of R TKA, CAD, HTN are also affecting patient's functional outcome.   REHAB POTENTIAL: Good  CLINICAL DECISION MAKING: Stable/uncomplicated  EVALUATION COMPLEXITY: Low   GOALS:  SHORT TERM GOALS: Target date: 07/03/2023 Pt will be independent and compliant with HEP in order to improve bilateral distal hamstring length by at least 10 degrees for decreased L hip pain. Baseline: R/L 90-90 hamstring length = 150 degrees Goal status: INITIAL   LONG TERM GOALS: Target date: 07/31/2023  Pt will increase LEFS to >50 in order to demonstrate subjective improvement in pain and ADL performance. Baseline: Initial 29 Goal status: INITIAL  2.  Pt will increase L hip AROM by at least 10 degrees in all planes to improve function with squatting/bending activities.  Baseline: See above  for initial ROM measurements  Goal status: INITIAL  3.  Pt will improve bilateral hip MMT scores to at least 4/5 in order to show improved hip musculature strength needed for improved walking/activity tolerance. Baseline: See above for initial MMT scores Goal status: INITIAL  4.  Pt will decrease 5TSTS by at least 3 seconds in order to demonstrate clinically significant improvement in LE strength. Baseline: see above Goal status: INITIAL  5.  Pt will report decrease pain by at least 3 points on NPS scale with walking in order to show improved walking tolerance. Baseline: >6/10 NPS scale with walking on initial evaluation Goal status: INITIAL  6.  Pt will be able to climb ladder with no hip limitations in order to be able to go on Cruise ship at end of year.   Baseline: unable Goal status: INITIAL   PLAN:  PT FREQUENCY: 2x/week  PT DURATION: 8 weeks  PLANNED INTERVENTIONS: Therapeutic exercises, Therapeutic activity, Neuromuscular re-education, Balance training, Self Care, Joint mobilization, Cryotherapy, Moist heat, Manual therapy, and Re-evaluation  PLAN FOR NEXT SESSION: Review HEP, introduce hip strengthening exercises (ankle weights, banded resistance, resisted gait), LE stretching  Lendell Quarry, PT, DPT # [id number] 06/11/2023, 12:15 PM

## 2023-06-13 ENCOUNTER — Ambulatory Visit: Admitting: Physical Therapy

## 2023-06-18 ENCOUNTER — Ambulatory Visit: Admitting: Physical Therapy

## 2023-06-18 DIAGNOSIS — M25652 Stiffness of left hip, not elsewhere classified: Secondary | ICD-10-CM

## 2023-06-18 DIAGNOSIS — R269 Unspecified abnormalities of gait and mobility: Secondary | ICD-10-CM

## 2023-06-18 DIAGNOSIS — M25552 Pain in left hip: Secondary | ICD-10-CM

## 2023-06-18 DIAGNOSIS — R6889 Other general symptoms and signs: Secondary | ICD-10-CM

## 2023-06-18 DIAGNOSIS — M6281 Muscle weakness (generalized): Secondary | ICD-10-CM

## 2023-06-18 NOTE — Therapy (Signed)
 OUTPATIENT PHYSICAL THERAPY LOWER EXTREMITY TREATMENT  Patient Name: Meagan Cox MRN: [id number] DOB:12-Jan-1941, 83 y.o., female Today's Date: 06/18/2023  END OF SESSION:  PT End of Session - 06/18/23 1109     Visit Number 3    Number of Visits 16    Date for PT Re-Evaluation 07/31/23    PT Start Time 1109    PT Stop Time 1155    PT Time Calculation (min) 46 min    Activity Tolerance Patient tolerated treatment well    Behavior During Therapy WFL for tasks assessed/performed            Past Medical History:  Diagnosis Date   Hyperlipidemia    Hypertension    Hypothyroidism    Past Surgical History:  Procedure Laterality Date   ABDOMINAL HYSTERECTOMY     CARDIAC CATHETERIZATION     30 years ago, [place]   CHOLECYSTECTOMY N/A 08/21/2015   Procedure: LAPAROSCOPIC CHOLECYSTECTOMY;  Surgeon: Alben Alma, MD;  Location: [facility];  Service: General;  Laterality: N/A;   EYE SURGERY     eye lid lift   KNEE ARTHROSCOPY Right 08/28/2018   Procedure: ARTHROSCOPY KNEE;  Surgeon: Jerlyn Moons, MD;  Location: [facility];  Service: Orthopedics;  Laterality: Right;   PARTIAL HYSTERECTOMY     REPLACEMENT TOTAL KNEE Right 2021   Bowers   right hip replacement Right 2017   [facility]ers   TONSILLECTOMY     TOTAL HIP ARTHROPLASTY Left    Pt reported left hip replacement done in June   Patient Active Problem List   Diagnosis Date Noted   Hypervitaminosis D 12/20/2022   Cough in adult patient 12/20/2022   Encounter for preventive care 12/30/2021   Asymptomatic postmenopausal estrogen deficiency 12/28/2021   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   Aortic atherosclerosis (HCC) 11/05/2020   Postsurgical menopause 06/09/2020   Breast cancer screening 05/04/2020   Loss of perception for taste 10/19/2017   Essential hypertension 03/05/2017   S/P laparoscopic cholecystectomy 03/05/2017   Ptosis, both eyelids 01/04/2017   Complete tear, knee, anterior cruciate ligament  08/28/2016   History of total hip arthroplasty 08/28/2016   Tear of meniscus of knee 08/28/2016   Medicare annual wellness visit, subsequent 07/24/2015   Cerebrovascular small vessel disease 04/24/2015   Benign paroxysmal positional vertigo 04/24/2015   Edema 05/14/2012   Polyarthritis of ankle 04/08/2011   Hypothyroidism 12/18/2010   Low back pain radiating to both legs 12/18/2010   Chronic right hip pain 12/18/2010   Obesity 05/09/2010   CAD (coronary artery disease) 05/09/2010   Hyperlipidemia 05/09/2010   PCP: Thersia Flax, MD  REFERRING PROVIDER: Tara Fanti., MD  REFERRING DIAG: [phone] (ICD-10-CM) - Presence of left artificial hip joint   THERAPY DIAG:  Pain in left hip  Stiffness of left hip, not elsewhere classified  Gait difficulty  Muscle weakness (generalized)  Decreased activity tolerance  Rationale for Evaluation and Treatment: Rehabilitation  ONSET DATE: 09/07/2022  SUBJECTIVE:   SUBJECTIVE STATEMENT: Pt reports to PT with c/c of L lateral hip/ piriformis pain with increase walking/ climbing stairs/ household tasks/ sleeping position.  Pt had L hip replaced on 07/12/2022, which subsequently got infected on 08/01/2022. Pt. Worked hard with PT in past and returns today with increase c/o L hip/piriformis pain symptoms.  Pt. Prescribed Celebrex  but has not started medication. She denies any numbness/tingling or clicking/popping.   PERTINENT HISTORY: Hx of bilateral THA (2017), R TKA (2021), CAD, HTN  PAIN:  Are you having pain? Yes: NPRS scale: 3/10 currently, >7/10 at worst Pain location: L lateral hip Pain description: sore/ tight Aggravating factors: Walking Relieving factors: Rest, wine  PRECAUTIONS: None  RED FLAGS: None   WEIGHT BEARING RESTRICTIONS: No  FALLS:  Has patient fallen in last 6 months? No falls but several near falls where pt describes losing her balance with forward leaning  LIVING ENVIRONMENT: Lives with: lives with  their daughter Lives in: House/apartment Stairs: Yes: External: 3 steps; can reach both Has following equipment at home: Retail banker - 2 wheeled  OCCUPATION: Retired  PLOF: Independent with household mobility with device   PATIENT GOALS: Get back to walking without pain, get ready for 1 month [place] cruise in December   NEXT MD VISIT: 3rd week of June  OBJECTIVE:   DIAGNOSTIC FINDINGS: N/A  PATIENT SURVEYS:  LEFS: 29 out of 79  COGNITION: Overall cognitive status: Within functional limits for tasks assessed     SENSATION: WFL  MUSCLE LENGTH: Hamstrings: Right 150 deg; Left 150 deg  POSTURE: No Significant postural limitations  LEG LENGTH: 34 inches bilaterally for true leg length (ASIS to medial malleolus)  LOWER EXTREMITY ROM:  Active ROM Right eval Left eval  Hip flexion 115 110  Hip extension 12 8  Hip abduction 32 21  Hip adduction    Hip internal rotation    Hip external rotation    Knee flexion [facility] WFL  Knee extension [facility] WFL  Ankle dorsiflexion WFL WFL   (Blank rows = not tested)  LOWER EXTREMITY MMT:  MMT Right eval Left eval  Hip flexion 3+ 3   Hip extension 4 3+  Hip abduction 4 3  Hip adduction    Hip internal rotation 3+ 3+  Hip external rotation 4- 3+  Knee flexion 5 5  Knee extension 5 5  Ankle dorsiflexion    Ankle plantarflexion    Ankle inversion    Ankle eversion     (Blank rows = not tested)  LOWER EXTREMITY SPECIAL TESTS:  Hip special tests: Portia Brittle (FABER) test: positive on L and (FADIR) test: negative bilaterally  FUNCTIONAL TESTS:  5 times sit to stand: 22.58 seconds (no UE assist).  L hip IR/ foot IV with STS and while sitting in recliner (reported per pt.).    GAIT: Distance walked: in clinic Assistive device utilized: Quad cane small base Level of assistance: Complete Independence Comments: Antalgic gait, (+) trendelenburg noted on L side.  Increase L hip/ piriformis pain with recip.  Stair climbing, esp. Descending.  Pt. Prefers lateral step pattern while descending.     TODAY'S TREATMENT:                                                                                                                              DATE: 06/18/2023  Subjective:  Pt. Reports L hip pain is not "too bad" today.  Pt. Reports no new complaints  since last PT tx. Session.    There.ex.:  Nustep L2 5 min. B UE/LE (warm-up).  Discussed weekend activities.    Supine marching 10x/ hip abduction 10x/ SLR 10x each.  R sidelying L hip abduction/ clamshell 10x.    Step ups/downs on 6" step (pain limited on L).  Ascending/descending stairs with recip. Pattern and B UE assist on handrails (pain limited on L).    Manual tx.:  Supine LE generalized stretches with focus on hamstring/ piriformis.  L hip ER static stretches with holds (as tolerated)- 3x.    STM to L anterior hip/ quad in supine and piriformis in sidelying position (with/without use of Hypervolt).    Ice to L hip in R sidelying position after tx.    Reviewed HEP    PATIENT EDUCATION:  Education details: Pt educated on HEP form and frequency, role of PT going forward Person educated: Patient Education method: Explanation, Demonstration, and Handouts Education comprehension: verbalized understanding and returned demonstration  HOME EXERCISE PROGRAM: Access Code: [email or web address] URL: [email or web address] Date: 06/05/2023 Prepared by: Hazeline Lister  Exercises - Supine Hamstring Stretch  - 2 x daily - 7 x weekly - 1 sets - 3 reps - Supine Figure 4 Piriformis Stretch  - 2 x daily - 7 x weekly - 1 sets - 3 reps - 20 seconds hold - Supine Piriformis Stretch with Leg Straight  - 2 x daily - 7 x weekly - 1 sets - 3 reps - 20 seconds hold - Supine Bridge  - 2 x daily - 4-5 x weekly - 2 sets - 10 reps - Seated Piriformis Stretch  - 2 x daily - 7 x weekly - 1 sets - 3 reps - 20 seconds hold - Sit to Stand  - 2 x daily - 4-5 x weekly -  2 sets - 10 reps  ASSESSMENT:  CLINICAL IMPRESSION: Pt. Presents with decreased hip strength bilaterally (L > R), decreased L hip ROM, tightness of bilateral hamstrings and piriformis/glute musculature, as well as pain. Pt's pain is currently limiting her activity tolerance and function at home. Pt tested positive for FABER test on the L side, raising the likelihood that her symptoms are consisted with greater trochanteric pain syndrome/bursitis. Pt will benefit from continued PT services upon discharge to safely address deficits listed in patient problem list for decreased caregiver assistance and eventual return to PLOF.  OBJECTIVE IMPAIRMENTS: Abnormal gait, difficulty walking, decreased ROM, decreased strength, impaired flexibility, and pain.   ACTIVITY LIMITATIONS: bending, standing, squatting, and locomotion level  PARTICIPATION LIMITATIONS: cleaning, laundry, community activity, and yard work  PERSONAL FACTORS: Age, Time since onset of injury/illness/exacerbation, and 3+ comorbidities: Hx of bilateral THA, hx of R TKA, CAD, HTN are also affecting patient's functional outcome.   REHAB POTENTIAL: Good  CLINICAL DECISION MAKING: Stable/uncomplicated  EVALUATION COMPLEXITY: Low   GOALS:  SHORT TERM GOALS: Target date: 07/03/2023 Pt will be independent and compliant with HEP in order to improve bilateral distal hamstring length by at least 10 degrees for decreased L hip pain. Baseline: R/L 90-90 hamstring length = 150 degrees Goal status: Partially met  LONG TERM GOALS: Target date: 07/31/2023  Pt will increase LEFS to >50 in order to demonstrate subjective improvement in pain and ADL performance. Baseline: Initial 29 Goal status: INITIAL  2.  Pt will increase L hip AROM by at least 10 degrees in all planes to improve function with squatting/bending activities. Baseline: See above for initial ROM measurements  Goal status: INITIAL  3.  Pt will improve bilateral hip MMT scores to  at least 4/5 in order to show improved hip musculature strength needed for improved walking/activity tolerance. Baseline: See above for initial MMT scores Goal status: INITIAL  4.  Pt will decrease 5TSTS by at least 3 seconds in order to demonstrate clinically significant improvement in LE strength. Baseline: see above Goal status: INITIAL  5.  Pt will report decrease pain by at least 3 points on NPS scale with walking in order to show improved walking tolerance. Baseline: >6/10 NPS scale with walking on initial evaluation Goal status: INITIAL  6.  Pt will be able to climb ladder with no hip limitations in order to be able to go on Cruise ship at end of year.   Baseline: unable Goal status: INITIAL   PLAN:  PT FREQUENCY: 2x/week  PT DURATION: 8 weeks  PLANNED INTERVENTIONS: Therapeutic exercises, Therapeutic activity, Neuromuscular re-education, Balance training, Self Care, Joint mobilization, Cryotherapy, Moist heat, Manual therapy, and Re-evaluation  PLAN FOR NEXT SESSION: Review HEP, introduce hip strengthening exercises (ankle weights, banded resistance, resisted gait), LE stretching  Lendell Quarry, PT, DPT # [phone] 06/18/2023, 12:10 PM

## 2023-06-20 ENCOUNTER — Encounter: Payer: Self-pay | Admitting: Physical Therapy

## 2023-06-20 ENCOUNTER — Ambulatory Visit: Admitting: Physical Therapy

## 2023-06-20 DIAGNOSIS — R6889 Other general symptoms and signs: Secondary | ICD-10-CM

## 2023-06-20 DIAGNOSIS — R269 Unspecified abnormalities of gait and mobility: Secondary | ICD-10-CM

## 2023-06-20 DIAGNOSIS — M25652 Stiffness of left hip, not elsewhere classified: Secondary | ICD-10-CM

## 2023-06-20 DIAGNOSIS — M25552 Pain in left hip: Secondary | ICD-10-CM | POA: Diagnosis not present

## 2023-06-20 DIAGNOSIS — M6281 Muscle weakness (generalized): Secondary | ICD-10-CM

## 2023-06-20 NOTE — Therapy (Signed)
 OUTPATIENT PHYSICAL THERAPY LOWER EXTREMITY TREATMENT  Patient Name: Meagan Cox MRN: [id number] DOB:1940/03/10, 83 y.o., female Today's Date: 06/20/2023  END OF SESSION:  PT End of Session - 06/20/23 1115     Visit Number 4    Number of Visits 16    Date for PT Re-Evaluation 07/31/23    PT Start Time 1115    PT Stop Time 1156    PT Time Calculation (min) 41 min    Activity Tolerance Patient tolerated treatment well    Behavior During Therapy WFL for tasks assessed/performed            Past Medical History:  Diagnosis Date   Hyperlipidemia    Hypertension    Hypothyroidism    Past Surgical History:  Procedure Laterality Date   ABDOMINAL HYSTERECTOMY     CARDIAC CATHETERIZATION     30 years ago, [place]   CHOLECYSTECTOMY N/A 08/21/2015   Procedure: LAPAROSCOPIC CHOLECYSTECTOMY;  Surgeon: Alben Alma, MD;  Location: [facility];  Service: General;  Laterality: N/A;   EYE SURGERY     eye lid lift   KNEE ARTHROSCOPY Right 08/28/2018   Procedure: ARTHROSCOPY KNEE;  Surgeon: Jerlyn Moons, MD;  Location: [facility];  Service: Orthopedics;  Laterality: Right;   PARTIAL HYSTERECTOMY     REPLACEMENT TOTAL KNEE Right 2021   Bowers   right hip replacement Right 2017   [facility]ers   TONSILLECTOMY     TOTAL HIP ARTHROPLASTY Left    Pt reported left hip replacement done in June   Patient Active Problem List   Diagnosis Date Noted   Hypervitaminosis D 12/20/2022   Cough in adult patient 12/20/2022   Encounter for preventive care 12/30/2021   Asymptomatic postmenopausal estrogen deficiency 12/28/2021   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   Aortic atherosclerosis (HCC) 11/05/2020   Postsurgical menopause 06/09/2020   Breast cancer screening 05/04/2020   Loss of perception for taste 10/19/2017   Essential hypertension 03/05/2017   S/P laparoscopic cholecystectomy 03/05/2017   Ptosis, both eyelids 01/04/2017   Complete tear, knee, anterior cruciate ligament  08/28/2016   History of total hip arthroplasty 08/28/2016   Tear of meniscus of knee 08/28/2016   Medicare annual wellness visit, subsequent 07/24/2015   Cerebrovascular small vessel disease 04/24/2015   Benign paroxysmal positional vertigo 04/24/2015   Edema 05/14/2012   Polyarthritis of ankle 04/08/2011   Hypothyroidism 12/18/2010   Low back pain radiating to both legs 12/18/2010   Chronic right hip pain 12/18/2010   Obesity 05/09/2010   CAD (coronary artery disease) 05/09/2010   Hyperlipidemia 05/09/2010   PCP: Thersia Flax, MD  REFERRING PROVIDER: Tara Fanti., MD  REFERRING DIAG: [phone] (ICD-10-CM) - Presence of left artificial hip joint   THERAPY DIAG:  Pain in left hip  Stiffness of left hip, not elsewhere classified  Gait difficulty  Muscle weakness (generalized)  Decreased activity tolerance  Rationale for Evaluation and Treatment: Rehabilitation  ONSET DATE: 09/07/2022  SUBJECTIVE:   SUBJECTIVE STATEMENT: Pt reports to PT with c/c of L lateral hip/ piriformis pain with increase walking/ climbing stairs/ household tasks/ sleeping position.  Pt had L hip replaced on 07/12/2022, which subsequently got infected on 08/01/2022. Pt. Worked hard with PT in past and returns today with increase c/o L hip/piriformis pain symptoms.  Pt. Prescribed Celebrex  but has not started medication. She denies any numbness/tingling or clicking/popping.   PERTINENT HISTORY: Hx of bilateral THA (2017), R TKA (2021), CAD, HTN  PAIN:  Are you having pain? Yes: NPRS scale: 3/10 currently, >7/10 at worst Pain location: L lateral hip Pain description: sore/ tight Aggravating factors: Walking Relieving factors: Rest, wine  PRECAUTIONS: None  RED FLAGS: None   WEIGHT BEARING RESTRICTIONS: No  FALLS:  Has patient fallen in last 6 months? No falls but several near falls where pt describes losing her balance with forward leaning  LIVING ENVIRONMENT: Lives with: lives with  their daughter Lives in: House/apartment Stairs: Yes: External: 3 steps; can reach both Has following equipment at home: Retail banker - 2 wheeled  OCCUPATION: Retired  PLOF: Independent with household mobility with device   PATIENT GOALS: Get back to walking without pain, get ready for 1 month [place] cruise in December   NEXT MD VISIT: 3rd week of June  OBJECTIVE:   DIAGNOSTIC FINDINGS: N/A  PATIENT SURVEYS:  LEFS: 29 out of 60  COGNITION: Overall cognitive status: Within functional limits for tasks assessed     SENSATION: WFL  MUSCLE LENGTH: Hamstrings: Right 150 deg; Left 150 deg  POSTURE: No Significant postural limitations  LEG LENGTH: 34 inches bilaterally for true leg length (ASIS to medial malleolus)  LOWER EXTREMITY ROM:  Active ROM Right eval Left eval  Hip flexion 115 110  Hip extension 12 8  Hip abduction 32 21  Hip adduction    Hip internal rotation    Hip external rotation    Knee flexion [facility] WFL  Knee extension [facility] WFL  Ankle dorsiflexion WFL WFL   (Blank rows = not tested)  LOWER EXTREMITY MMT:  MMT Right eval Left eval  Hip flexion 3+ 3   Hip extension 4 3+  Hip abduction 4 3  Hip adduction    Hip internal rotation 3+ 3+  Hip external rotation 4- 3+  Knee flexion 5 5  Knee extension 5 5  Ankle dorsiflexion    Ankle plantarflexion    Ankle inversion    Ankle eversion     (Blank rows = not tested)  LOWER EXTREMITY SPECIAL TESTS:  Hip special tests: Portia Brittle (FABER) test: positive on L and (FADIR) test: negative bilaterally  FUNCTIONAL TESTS:  5 times sit to stand: 22.58 seconds (no UE assist).  L hip IR/ foot IV with STS and while sitting in recliner (reported per pt.).    GAIT: Distance walked: in clinic Assistive device utilized: Quad cane small base Level of assistance: Complete Independence Comments: Antalgic gait, (+) trendelenburg noted on L side.  Increase L hip/ piriformis pain with recip.  Stair climbing, esp. Descending.  Pt. Prefers lateral step pattern while descending.    TODAY'S TREATMENT:                                                                                                                              DATE: 06/20/2023  Subjective:  Pt. Reports "feeling better" since last PT tx. Session.  Pt. Reports 2/10 L hip pain while  walking into PT clinic.  Less antalgic gait noted today.    Manual tx.:  Supine LE generalized stretches with focus on hamstring/ piriformis.  L hip ER static stretches with holds (as tolerated)- 3x.    STM to L anterior hip/ quad in supine and piriformis in sidelying position (with/without use of Hypervolt).    There.ex.:  Supine marching 10x/ hip abduction 10x/ SLR 10x each.  R sidelying L hip abduction/ clamshell/ reverse clamshell 10x.    Walking around PT clinic/ hallway with focus on maintaining BOS.     Ice to L hip in R sidelying position after tx.     NOT TODAY:  Nustep L2 5 min. B UE/LE (warm-up).     PATIENT EDUCATION:  Education details: Pt educated on HEP form and frequency, role of PT going forward Person educated: Patient Education method: Explanation, Demonstration, and Handouts Education comprehension: verbalized understanding and returned demonstration  HOME EXERCISE PROGRAM: Access Code: [email or web address] URL: [email or web address] Date: 06/05/2023 Prepared by: Hazeline Lister  Exercises - Supine Hamstring Stretch  - 2 x daily - 7 x weekly - 1 sets - 3 reps - Supine Figure 4 Piriformis Stretch  - 2 x daily - 7 x weekly - 1 sets - 3 reps - 20 seconds hold - Supine Piriformis Stretch with Leg Straight  - 2 x daily - 7 x weekly - 1 sets - 3 reps - 20 seconds hold - Supine Bridge  - 2 x daily - 4-5 x weekly - 2 sets - 10 reps - Seated Piriformis Stretch  - 2 x daily - 7 x weekly - 1 sets - 3 reps - 20 seconds hold - Sit to Stand  - 2 x daily - 4-5 x weekly - 2 sets - 10 reps  ASSESSMENT:  CLINICAL  IMPRESSION: Pt. Presents with decreased hip strength bilaterally (L > R), decreased L hip ROM, tightness of bilateral hamstrings and piriformis/glute musculature, as well as pain.  Pt. Able to relax better today during hamstring/ hip stretches.  Good tx. Tolerance with manual stretches/ STM and use of Hypervolt.  Pt will benefit from continued PT services upon discharge to safely address deficits listed in patient problem list for decreased caregiver assistance and eventual return to PLOF.  OBJECTIVE IMPAIRMENTS: Abnormal gait, difficulty walking, decreased ROM, decreased strength, impaired flexibility, and pain.   ACTIVITY LIMITATIONS: bending, standing, squatting, and locomotion level  PARTICIPATION LIMITATIONS: cleaning, laundry, community activity, and yard work  PERSONAL FACTORS: Age, Time since onset of injury/illness/exacerbation, and 3+ comorbidities: Hx of bilateral THA, hx of R TKA, CAD, HTN are also affecting patient's functional outcome.   REHAB POTENTIAL: Good  CLINICAL DECISION MAKING: Stable/uncomplicated  EVALUATION COMPLEXITY: Low   GOALS:  SHORT TERM GOALS: Target date: 07/03/2023 Pt will be independent and compliant with HEP in order to improve bilateral distal hamstring length by at least 10 degrees for decreased L hip pain. Baseline: R/L 90-90 hamstring length = 150 degrees Goal status: Partially met  LONG TERM GOALS: Target date: 07/31/2023  Pt will increase LEFS to >50 in order to demonstrate subjective improvement in pain and ADL performance. Baseline: Initial 29 Goal status: INITIAL  2.  Pt will increase L hip AROM by at least 10 degrees in all planes to improve function with squatting/bending activities. Baseline: See above for initial ROM measurements  Goal status: INITIAL  3.  Pt will improve bilateral hip MMT scores to at least 4/5 in order to show improved hip musculature strength needed  for improved walking/activity tolerance. Baseline: See above for  initial MMT scores Goal status: INITIAL  4.  Pt will decrease 5TSTS by at least 3 seconds in order to demonstrate clinically significant improvement in LE strength. Baseline: see above Goal status: INITIAL  5.  Pt will report decrease pain by at least 3 points on NPS scale with walking in order to show improved walking tolerance. Baseline: >6/10 NPS scale with walking on initial evaluation Goal status: INITIAL  6.  Pt will be able to climb ladder with no hip limitations in order to be able to go on Cruise ship at end of year.   Baseline: unable Goal status: INITIAL  PLAN:  PT FREQUENCY: 2x/week  PT DURATION: 8 weeks  PLANNED INTERVENTIONS: Therapeutic exercises, Therapeutic activity, Neuromuscular re-education, Balance training, Self Care, Joint mobilization, Cryotherapy, Moist heat, Manual therapy, and Re-evaluation  PLAN FOR NEXT SESSION: Review HEP, introduce hip strengthening exercises (ankle weights, banded resistance, resisted gait), LE stretching  Lendell Quarry, PT, DPT # [id number] 06/20/2023, 12:24 PM

## 2023-06-25 ENCOUNTER — Ambulatory Visit: Admitting: Physical Therapy

## 2023-06-25 ENCOUNTER — Encounter: Payer: Self-pay | Admitting: Physical Therapy

## 2023-06-25 DIAGNOSIS — M6281 Muscle weakness (generalized): Secondary | ICD-10-CM

## 2023-06-25 DIAGNOSIS — M25652 Stiffness of left hip, not elsewhere classified: Secondary | ICD-10-CM

## 2023-06-25 DIAGNOSIS — M25552 Pain in left hip: Secondary | ICD-10-CM | POA: Diagnosis not present

## 2023-06-25 DIAGNOSIS — R269 Unspecified abnormalities of gait and mobility: Secondary | ICD-10-CM

## 2023-06-25 DIAGNOSIS — R6889 Other general symptoms and signs: Secondary | ICD-10-CM

## 2023-06-25 NOTE — Therapy (Signed)
 OUTPATIENT PHYSICAL THERAPY LOWER EXTREMITY TREATMENT  Patient Name: Meagan Cox MRN: [id number] DOB:March 02, 1940, 83 y.o., female Today's Date: 06/25/2023  END OF SESSION:  PT End of Session - 06/25/23 1100     Visit Number 5    Number of Visits 16    Date for PT Re-Evaluation 07/31/23    PT Start Time 1111    PT Stop Time 1158    PT Time Calculation (min) 47 min    Activity Tolerance Patient tolerated treatment well    Behavior During Therapy WFL for tasks assessed/performed            Past Medical History:  Diagnosis Date   Hyperlipidemia    Hypertension    Hypothyroidism    Past Surgical History:  Procedure Laterality Date   ABDOMINAL HYSTERECTOMY     CARDIAC CATHETERIZATION     30 years ago, [place]   CHOLECYSTECTOMY N/A 08/21/2015   Procedure: LAPAROSCOPIC CHOLECYSTECTOMY;  Surgeon: Alben Alma, MD;  Location: [facility];  Service: General;  Laterality: N/A;   EYE SURGERY     eye lid lift   KNEE ARTHROSCOPY Right 08/28/2018   Procedure: ARTHROSCOPY KNEE;  Surgeon: Jerlyn Moons, MD;  Location: [facility];  Service: Orthopedics;  Laterality: Right;   PARTIAL HYSTERECTOMY     REPLACEMENT TOTAL KNEE Right 2021   [facility]ers   right hip replacement Right 2017   [facility]ers   TONSILLECTOMY     TOTAL HIP ARTHROPLASTY Left    Pt reported left hip replacement done in June   Patient Active Problem List   Diagnosis Date Noted   Hypervitaminosis D 12/20/2022   Cough in adult patient 12/20/2022   Encounter for preventive care 12/30/2021   Asymptomatic postmenopausal estrogen deficiency 12/28/2021   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   Aortic atherosclerosis (HCC) 11/05/2020   Postsurgical menopause 06/09/2020   Breast cancer screening 05/04/2020   Loss of perception for taste 10/19/2017   Essential hypertension 03/05/2017   S/P laparoscopic cholecystectomy 03/05/2017   Ptosis, both eyelids 01/04/2017   Complete tear, knee, anterior cruciate ligament  08/28/2016   History of total hip arthroplasty 08/28/2016   Tear of meniscus of knee 08/28/2016   Medicare annual wellness visit, subsequent 07/24/2015   Cerebrovascular small vessel disease 04/24/2015   Benign paroxysmal positional vertigo 04/24/2015   Edema 05/14/2012   Polyarthritis of ankle 04/08/2011   Hypothyroidism 12/18/2010   Low back pain radiating to both legs 12/18/2010   Chronic right hip pain 12/18/2010   Obesity 05/09/2010   CAD (coronary artery disease) 05/09/2010   Hyperlipidemia 05/09/2010   PCP: Thersia Flax, MD  REFERRING PROVIDER: Tara Fanti., MD  REFERRING DIAG: [phone] (ICD-10-CM) - Presence of left artificial hip joint   THERAPY DIAG:  Pain in left hip  Stiffness of left hip, not elsewhere classified  Gait difficulty  Muscle weakness (generalized)  Decreased activity tolerance  Rationale for Evaluation and Treatment: Rehabilitation  ONSET DATE: 09/07/2022  SUBJECTIVE:   SUBJECTIVE STATEMENT: Pt reports to PT with c/c of L lateral hip/ piriformis pain with increase walking/ climbing stairs/ household tasks/ sleeping position.  Pt had L hip replaced on 07/12/2022, which subsequently got infected on 08/01/2022. Pt. Worked hard with PT in past and returns today with increase c/o L hip/piriformis pain symptoms.  Pt. Prescribed Celebrex  but has not started medication. She denies any numbness/tingling or clicking/popping.   PERTINENT HISTORY: Hx of bilateral THA (2017), R TKA (2021), CAD, HTN  PAIN:  Are you having pain? Yes: NPRS scale: 3/10 currently, >7/10 at worst Pain location: L lateral hip Pain description: sore/ tight Aggravating factors: Walking Relieving factors: Rest, wine  PRECAUTIONS: None  RED FLAGS: None   WEIGHT BEARING RESTRICTIONS: No  FALLS:  Has patient fallen in last 6 months? No falls but several near falls where pt describes losing her balance with forward leaning  LIVING ENVIRONMENT: Lives with: lives with  their daughter Lives in: House/apartment Stairs: Yes: External: 3 steps; can reach both Has following equipment at home: Retail banker - 2 wheeled  OCCUPATION: Retired  PLOF: Independent with household mobility with device   PATIENT GOALS: Get back to walking without pain, get ready for 1 month [place] cruise in December   NEXT MD VISIT: 3rd week of June  OBJECTIVE:   DIAGNOSTIC FINDINGS: N/A  PATIENT SURVEYS:  LEFS: 29 out of 29  COGNITION: Overall cognitive status: Within functional limits for tasks assessed     SENSATION: WFL  MUSCLE LENGTH: Hamstrings: Right 150 deg; Left 150 deg  POSTURE: No Significant postural limitations  LEG LENGTH: 34 inches bilaterally for true leg length (ASIS to medial malleolus)  LOWER EXTREMITY ROM:  Active ROM Right eval Left eval  Hip flexion 115 110  Hip extension 12 8  Hip abduction 32 21  Hip adduction    Hip internal rotation    Hip external rotation    Knee flexion [facility] WFL  Knee extension [facility] WFL  Ankle dorsiflexion WFL WFL   (Blank rows = not tested)  LOWER EXTREMITY MMT:  MMT Right eval Left eval  Hip flexion 3+ 3   Hip extension 4 3+  Hip abduction 4 3  Hip adduction    Hip internal rotation 3+ 3+  Hip external rotation 4- 3+  Knee flexion 5 5  Knee extension 5 5  Ankle dorsiflexion    Ankle plantarflexion    Ankle inversion    Ankle eversion     (Blank rows = not tested)  LOWER EXTREMITY SPECIAL TESTS:  Hip special tests: Portia Brittle (FABER) test: positive on L and (FADIR) test: negative bilaterally  FUNCTIONAL TESTS:  5 times sit to stand: 22.58 seconds (no UE assist).  L hip IR/ foot IV with STS and while sitting in recliner (reported per pt.).    GAIT: Distance walked: in clinic Assistive device utilized: Quad cane small base Level of assistance: Complete Independence Comments: Antalgic gait, (+) trendelenburg noted on L side.  Increase L hip/ piriformis pain with recip.  Stair climbing, esp. Descending.  Pt. Prefers lateral step pattern while descending.    TODAY'S TREATMENT:                                                                                                                              DATE: 06/25/2023  Subjective:  Pt. Reports 2/10 L hip pain while walking into PT clinic.  Pt. Reports a slight increase  in hip pain with increase walking/ shopping over the weekend.    There.ex.:  Walking around PT clinic/ hallway with focus on maintaining BOS and hip symmetry to prevent antalgic gait pattern.     Walking in //-bars: forward/ backwards/ lateral (exaggerated step length)- mirror feedback.  4 laps each.  L anterior hip limited with hip extension due to muscle tightness.    TRX squats 10x2.  Lateral step outs 10x on L/R.    Discussed HEP  Manual tx.:  Supine LE generalized stretches with focus on hamstring/ piriformis.  L hip ER/IR static stretches with holds (as tolerated)- 3x.  Supine L hip flexor stretches with knee flexed (as tolerated).    STM to L anterior hip/ quad in supine and piriformis in sidelying position (with/without use of Hypervolt).    Pt.will ice L hip at home.      NOT TODAY:  Nustep L2 5 min. B UE/LE (warm-up).     PATIENT EDUCATION:  Education details: Pt educated on HEP form and frequency, role of PT going forward Person educated: Patient Education method: Explanation, Demonstration, and Handouts Education comprehension: verbalized understanding and returned demonstration  HOME EXERCISE PROGRAM: Access Code: [email or web address] URL: [email or web address] Date: 06/05/2023 Prepared by: Hazeline Lister  Exercises - Supine Hamstring Stretch  - 2 x daily - 7 x weekly - 1 sets - 3 reps - Supine Figure 4 Piriformis Stretch  - 2 x daily - 7 x weekly - 1 sets - 3 reps - 20 seconds hold - Supine Piriformis Stretch with Leg Straight  - 2 x daily - 7 x weekly - 1 sets - 3 reps - 20 seconds hold - Supine Bridge  - 2 x  daily - 4-5 x weekly - 2 sets - 10 reps - Seated Piriformis Stretch  - 2 x daily - 7 x weekly - 1 sets - 3 reps - 20 seconds hold - Sit to Stand  - 2 x daily - 4-5 x weekly - 2 sets - 10 reps  ASSESSMENT:  CLINICAL IMPRESSION: L anterior hip muscle tightness and tenderness during manual tx.  Pt. Ambulates with slight antalgic gait pattern with increase distance walked.  L hip discomfort with backwards walking.  Good tx. Tolerance with manual stretches/ STM and use of Hypervolt.  Pt will benefit from continued PT services upon discharge to safely address deficits listed in patient problem list for decreased caregiver assistance and eventual return to PLOF.  OBJECTIVE IMPAIRMENTS: Abnormal gait, difficulty walking, decreased ROM, decreased strength, impaired flexibility, and pain.   ACTIVITY LIMITATIONS: bending, standing, squatting, and locomotion level  PARTICIPATION LIMITATIONS: cleaning, laundry, community activity, and yard work  PERSONAL FACTORS: Age, Time since onset of injury/illness/exacerbation, and 3+ comorbidities: Hx of bilateral THA, hx of R TKA, CAD, HTN are also affecting patient's functional outcome.   REHAB POTENTIAL: Good  CLINICAL DECISION MAKING: Stable/uncomplicated  EVALUATION COMPLEXITY: Low   GOALS:  SHORT TERM GOALS: Target date: 07/03/2023 Pt will be independent and compliant with HEP in order to improve bilateral distal hamstring length by at least 10 degrees for decreased L hip pain. Baseline: R/L 90-90 hamstring length = 150 degrees Goal status: Partially met  LONG TERM GOALS: Target date: 07/31/2023  Pt will increase LEFS to >50 in order to demonstrate subjective improvement in pain and ADL performance. Baseline: Initial 29 Goal status: INITIAL  2.  Pt will increase L hip AROM by at least 10 degrees in all planes to improve function with squatting/bending activities. Baseline:  See above for initial ROM measurements  Goal status: INITIAL  3.  Pt will  improve bilateral hip MMT scores to at least 4/5 in order to show improved hip musculature strength needed for improved walking/activity tolerance. Baseline: See above for initial MMT scores Goal status: INITIAL  4.  Pt will decrease 5TSTS by at least 3 seconds in order to demonstrate clinically significant improvement in LE strength. Baseline: see above Goal status: INITIAL  5.  Pt will report decrease pain by at least 3 points on NPS scale with walking in order to show improved walking tolerance. Baseline: >6/10 NPS scale with walking on initial evaluation Goal status: INITIAL  6.  Pt will be able to climb ladder with no hip limitations in order to be able to go on Cruise ship at end of year.   Baseline: unable Goal status: INITIAL  PLAN:  PT FREQUENCY: 2x/week  PT DURATION: 8 weeks  PLANNED INTERVENTIONS: Therapeutic exercises, Therapeutic activity, Neuromuscular re-education, Balance training, Self Care, Joint mobilization, Cryotherapy, Moist heat, Manual therapy, and Re-evaluation  PLAN FOR NEXT SESSION: Review HEP, introduce hip strengthening exercises (ankle weights, banded resistance, resisted gait), LE stretching  Lendell Quarry, PT, DPT # [phone] 06/25/2023, 12:26 PM

## 2023-06-27 ENCOUNTER — Ambulatory Visit: Admitting: Physical Therapy

## 2023-06-27 ENCOUNTER — Encounter: Payer: Self-pay | Admitting: Physical Therapy

## 2023-06-27 DIAGNOSIS — M25552 Pain in left hip: Secondary | ICD-10-CM | POA: Diagnosis not present

## 2023-06-27 DIAGNOSIS — R6889 Other general symptoms and signs: Secondary | ICD-10-CM

## 2023-06-27 DIAGNOSIS — M25652 Stiffness of left hip, not elsewhere classified: Secondary | ICD-10-CM

## 2023-06-27 DIAGNOSIS — R269 Unspecified abnormalities of gait and mobility: Secondary | ICD-10-CM

## 2023-06-27 DIAGNOSIS — M6281 Muscle weakness (generalized): Secondary | ICD-10-CM

## 2023-06-27 NOTE — Therapy (Signed)
 OUTPATIENT PHYSICAL THERAPY LOWER EXTREMITY TREATMENT  Patient Name: Meagan Cox MRN: [id number] DOB:May 02, 1940, 83 y.o., female Today's Date: 06/27/2023  END OF SESSION:  PT End of Session - 06/27/23 1109     Visit Number 6    Number of Visits 16    Date for PT Re-Evaluation 07/31/23    PT Start Time 1109    PT Stop Time 1154    PT Time Calculation (min) 45 min    Activity Tolerance Patient tolerated treatment well    Behavior During Therapy WFL for tasks assessed/performed            Past Medical History:  Diagnosis Date   Hyperlipidemia    Hypertension    Hypothyroidism    Past Surgical History:  Procedure Laterality Date   ABDOMINAL HYSTERECTOMY     CARDIAC CATHETERIZATION     30 years ago, [place]   CHOLECYSTECTOMY N/A 08/21/2015   Procedure: LAPAROSCOPIC CHOLECYSTECTOMY;  Surgeon: Alben Alma, MD;  Location: [facility];  Service: General;  Laterality: N/A;   EYE SURGERY     eye lid lift   KNEE ARTHROSCOPY Right 08/28/2018   Procedure: ARTHROSCOPY KNEE;  Surgeon: Jerlyn Moons, MD;  Location: [facility];  Service: Orthopedics;  Laterality: Right;   PARTIAL HYSTERECTOMY     REPLACEMENT TOTAL KNEE Right 2021   Bowers   right hip replacement Right 2017   [facility]   TONSILLECTOMY     TOTAL HIP ARTHROPLASTY Left    Pt reported left hip replacement done in June   Patient Active Problem List   Diagnosis Date Noted   Hypervitaminosis D 12/20/2022   Cough in adult patient 12/20/2022   Encounter for preventive care 12/30/2021   Asymptomatic postmenopausal estrogen deficiency 12/28/2021   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   Aortic atherosclerosis (HCC) 11/05/2020   Postsurgical menopause 06/09/2020   Breast cancer screening 05/04/2020   Loss of perception for taste 10/19/2017   Essential hypertension 03/05/2017   S/P laparoscopic cholecystectomy 03/05/2017   Ptosis, both eyelids 01/04/2017   Complete tear, knee, anterior cruciate ligament  08/28/2016   History of total hip arthroplasty 08/28/2016   Tear of meniscus of knee 08/28/2016   Medicare annual wellness visit, subsequent 07/24/2015   Cerebrovascular small vessel disease 04/24/2015   Benign paroxysmal positional vertigo 04/24/2015   Edema 05/14/2012   Polyarthritis of ankle 04/08/2011   Hypothyroidism 12/18/2010   Low back pain radiating to both legs 12/18/2010   Chronic right hip pain 12/18/2010   Obesity 05/09/2010   CAD (coronary artery disease) 05/09/2010   Hyperlipidemia 05/09/2010   PCP: Thersia Flax, MD  REFERRING PROVIDER: Tara Fanti., MD  REFERRING DIAG: [phone] (ICD-10-CM) - Presence of left artificial hip joint   THERAPY DIAG:  Pain in left hip  Stiffness of left hip, not elsewhere classified  Gait difficulty  Muscle weakness (generalized)  Decreased activity tolerance  Rationale for Evaluation and Treatment: Rehabilitation  ONSET DATE: 09/07/2022  SUBJECTIVE:   SUBJECTIVE STATEMENT: Pt reports to PT with c/c of L lateral hip/ piriformis pain with increase walking/ climbing stairs/ household tasks/ sleeping position.  Pt had L hip replaced on 07/12/2022, which subsequently got infected on 08/01/2022. Pt. Worked hard with PT in past and returns today with increase c/o L hip/piriformis pain symptoms.  Pt. Prescribed Celebrex  but has not started medication. She denies any numbness/tingling or clicking/popping.   PERTINENT HISTORY: Hx of bilateral THA (2017), R TKA (2021), CAD, HTN  PAIN:  Are you having pain? Yes: NPRS scale: 3/10 currently, >7/10 at worst Pain location: L lateral hip Pain description: sore/ tight Aggravating factors: Walking Relieving factors: Rest, wine  PRECAUTIONS: None  RED FLAGS: None   WEIGHT BEARING RESTRICTIONS: No  FALLS:  Has patient fallen in last 6 months? No falls but several near falls where pt describes losing her balance with forward leaning  LIVING ENVIRONMENT: Lives with: lives with  their daughter Lives in: House/apartment Stairs: Yes: External: 3 steps; can reach both Has following equipment at home: Retail banker - 2 wheeled  OCCUPATION: Retired  PLOF: Independent with household mobility with device   PATIENT GOALS: Get back to walking without pain, get ready for 1 month [place] cruise in December   NEXT MD VISIT: 3rd week of June  OBJECTIVE:   DIAGNOSTIC FINDINGS: N/A  PATIENT SURVEYS:  LEFS: 29 out of 96  COGNITION: Overall cognitive status: Within functional limits for tasks assessed     SENSATION: WFL  MUSCLE LENGTH: Hamstrings: Right 150 deg; Left 150 deg  POSTURE: No Significant postural limitations  LEG LENGTH: 34 inches bilaterally for true leg length (ASIS to medial malleolus)  LOWER EXTREMITY ROM:  Active ROM Right eval Left eval  Hip flexion 115 110  Hip extension 12 8  Hip abduction 32 21  Hip adduction    Hip internal rotation    Hip external rotation    Knee flexion [facility] WFL  Knee extension [facility] WFL  Ankle dorsiflexion WFL WFL   (Blank rows = not tested)  LOWER EXTREMITY MMT:  MMT Right eval Left eval  Hip flexion 3+ 3   Hip extension 4 3+  Hip abduction 4 3  Hip adduction    Hip internal rotation 3+ 3+  Hip external rotation 4- 3+  Knee flexion 5 5  Knee extension 5 5  Ankle dorsiflexion    Ankle plantarflexion    Ankle inversion    Ankle eversion     (Blank rows = not tested)  LOWER EXTREMITY SPECIAL TESTS:  Hip special tests: Portia Brittle (FABER) test: positive on L and (FADIR) test: negative bilaterally  FUNCTIONAL TESTS:  5 times sit to stand: 22.58 seconds (no UE assist).  L hip IR/ foot IV with STS and while sitting in recliner (reported per pt.).    GAIT: Distance walked: in clinic Assistive device utilized: Quad cane small base Level of assistance: Complete Independence Comments: Antalgic gait, (+) trendelenburg noted on L side.  Increase L hip/ piriformis pain with recip.  Stair climbing, esp. Descending.  Pt. Prefers lateral step pattern while descending.    TODAY'S TREATMENT:                                                                                                                              DATE: 06/27/2023  Subjective:  Pt. Reports no L hip pain while walking into PT clinic.  Pt. States she has a  painful area in L posterior hip while climbing stairs.    There.ex.:  Nustep L3 for 5 min. B UE/LE (warm-up/ discussed stairs/ upcoming weekend).    Walking in //-bars forward/ lateral 5 laps each.  Focus on L LE midline position (marked L hip IR noted)- no issues on R LE.  Mirror feedback.   Walking around PT clinic/ hallway with focus on maintaining BOS and hip symmetry to prevent antalgic gait pattern.     TRX squats 10x2.      Discussed HEP  Manual tx.:  Supine LE generalized stretches with focus on hamstring/ piriformis.  L hip ER/IR static stretches with holds (as tolerated)- 3x.  Supine L hip flexor stretches with knee flexed (as tolerated).    STM to L anterior hip/ quad in supine and piriformis in R sidelying position (with/without use of Hypervolt).     Pt.will ice L hip at home.      PATIENT EDUCATION:  Education details: Pt educated on HEP form and frequency, role of PT going forward Person educated: Patient Education method: Explanation, Demonstration, and Handouts Education comprehension: verbalized understanding and returned demonstration  HOME EXERCISE PROGRAM: Access Code: [email or web address] URL: [email or web address] Date: 06/05/2023 Prepared by: Hazeline Lister  Exercises - Supine Hamstring Stretch  - 2 x daily - 7 x weekly - 1 sets - 3 reps - Supine Figure 4 Piriformis Stretch  - 2 x daily - 7 x weekly - 1 sets - 3 reps - 20 seconds hold - Supine Piriformis Stretch with Leg Straight  - 2 x daily - 7 x weekly - 1 sets - 3 reps - 20 seconds hold - Supine Bridge  - 2 x daily - 4-5 x weekly - 2 sets - 10 reps - Seated  Piriformis Stretch  - 2 x daily - 7 x weekly - 1 sets - 3 reps - 20 seconds hold - Sit to Stand  - 2 x daily - 4-5 x weekly - 2 sets - 10 reps  ASSESSMENT:  CLINICAL IMPRESSION: L anterior hip muscle tightness and tenderness during manual tx.  Pt. Ambulates with slight antalgic gait pattern with increase distance walked.  No L hip pain during gait but (+) tenderness with palpation over L piriformis in R sidelying position.  Good tx. Tolerance with manual stretches/ STM and use of Hypervolt.  Pt will benefit from continued PT services upon discharge to safely address deficits listed in patient problem list for decreased caregiver assistance and eventual return to PLOF.  OBJECTIVE IMPAIRMENTS: Abnormal gait, difficulty walking, decreased ROM, decreased strength, impaired flexibility, and pain.   ACTIVITY LIMITATIONS: bending, standing, squatting, and locomotion level  PARTICIPATION LIMITATIONS: cleaning, laundry, community activity, and yard work  PERSONAL FACTORS: Age, Time since onset of injury/illness/exacerbation, and 3+ comorbidities: Hx of bilateral THA, hx of R TKA, CAD, HTN are also affecting patient's functional outcome.   REHAB POTENTIAL: Good  CLINICAL DECISION MAKING: Stable/uncomplicated  EVALUATION COMPLEXITY: Low   GOALS:  SHORT TERM GOALS: Target date: 07/03/2023 Pt will be independent and compliant with HEP in order to improve bilateral distal hamstring length by at least 10 degrees for decreased L hip pain. Baseline: R/L 90-90 hamstring length = 150 degrees Goal status: Partially met  LONG TERM GOALS: Target date: 07/31/2023  Pt will increase LEFS to >50 in order to demonstrate subjective improvement in pain and ADL performance. Baseline: Initial 29 Goal status: INITIAL  2.  Pt will increase L hip AROM by at least 10 degrees in  all planes to improve function with squatting/bending activities. Baseline: See above for initial ROM measurements  Goal status:  INITIAL  3.  Pt will improve bilateral hip MMT scores to at least 4/5 in order to show improved hip musculature strength needed for improved walking/activity tolerance. Baseline: See above for initial MMT scores Goal status: INITIAL  4.  Pt will decrease 5TSTS by at least 3 seconds in order to demonstrate clinically significant improvement in LE strength. Baseline: see above Goal status: INITIAL  5.  Pt will report decrease pain by at least 3 points on NPS scale with walking in order to show improved walking tolerance. Baseline: >6/10 NPS scale with walking on initial evaluation Goal status: INITIAL  6.  Pt will be able to climb ladder with no hip limitations in order to be able to go on Cruise ship at end of year.   Baseline: unable Goal status: INITIAL  PLAN:  PT FREQUENCY: 2x/week  PT DURATION: 8 weeks  PLANNED INTERVENTIONS: Therapeutic exercises, Therapeutic activity, Neuromuscular re-education, Balance training, Self Care, Joint mobilization, Cryotherapy, Moist heat, Manual therapy, and Re-evaluation  PLAN FOR NEXT SESSION: Review HEP, introduce hip strengthening exercises (ankle weights, banded resistance, resisted gait), LE stretching  Lendell Quarry, PT, DPT # [id number] 06/27/2023, 12:45 PM

## 2023-07-01 ENCOUNTER — Ambulatory Visit: Attending: Cardiology | Admitting: Physical Therapy

## 2023-07-01 ENCOUNTER — Encounter: Payer: Self-pay | Admitting: Physical Therapy

## 2023-07-01 DIAGNOSIS — R2689 Other abnormalities of gait and mobility: Secondary | ICD-10-CM | POA: Insufficient documentation

## 2023-07-01 DIAGNOSIS — R6889 Other general symptoms and signs: Secondary | ICD-10-CM | POA: Insufficient documentation

## 2023-07-01 DIAGNOSIS — M25552 Pain in left hip: Secondary | ICD-10-CM | POA: Diagnosis not present

## 2023-07-01 DIAGNOSIS — R269 Unspecified abnormalities of gait and mobility: Secondary | ICD-10-CM | POA: Insufficient documentation

## 2023-07-01 DIAGNOSIS — M25652 Stiffness of left hip, not elsewhere classified: Secondary | ICD-10-CM | POA: Diagnosis not present

## 2023-07-01 DIAGNOSIS — M2560 Stiffness of unspecified joint, not elsewhere classified: Secondary | ICD-10-CM | POA: Insufficient documentation

## 2023-07-01 DIAGNOSIS — Z96642 Presence of left artificial hip joint: Secondary | ICD-10-CM | POA: Diagnosis not present

## 2023-07-01 DIAGNOSIS — M6281 Muscle weakness (generalized): Secondary | ICD-10-CM | POA: Diagnosis not present

## 2023-07-01 NOTE — Therapy (Signed)
 OUTPATIENT PHYSICAL THERAPY LOWER EXTREMITY TREATMENT  Patient Name: Meagan Cox MRN: [id number] DOB:1940-09-12, 83 y.o., female Today's Date: 07/01/2023  END OF SESSION:  PT End of Session - 07/01/23 1121     Visit Number 7    Number of Visits 16    Date for PT Re-Evaluation 07/31/23    PT Start Time 1111    PT Stop Time 1201    PT Time Calculation (min) 50 min    Activity Tolerance Patient tolerated treatment well    Behavior During Therapy WFL for tasks assessed/performed            Past Medical History:  Diagnosis Date   Hyperlipidemia    Hypertension    Hypothyroidism    Past Surgical History:  Procedure Laterality Date   ABDOMINAL HYSTERECTOMY     CARDIAC CATHETERIZATION     30 years ago, [place]   CHOLECYSTECTOMY N/A 08/21/2015   Procedure: LAPAROSCOPIC CHOLECYSTECTOMY;  Surgeon: Alben Alma, MD;  Location: [facility];  Service: General;  Laterality: N/A;   EYE SURGERY     eye lid lift   KNEE ARTHROSCOPY Right 08/28/2018   Procedure: ARTHROSCOPY KNEE;  Surgeon: Jerlyn Moons, MD;  Location: [facility];  Service: Orthopedics;  Laterality: Right;   PARTIAL HYSTERECTOMY     REPLACEMENT TOTAL KNEE Right 2021   Bowers   right hip replacement Right 2017   [facility]ers   TONSILLECTOMY     TOTAL HIP ARTHROPLASTY Left    Pt reported left hip replacement done in June   Patient Active Problem List   Diagnosis Date Noted   Hypervitaminosis D 12/20/2022   Cough in adult patient 12/20/2022   Encounter for preventive care 12/30/2021   Asymptomatic postmenopausal estrogen deficiency 12/28/2021   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   Aortic atherosclerosis (HCC) 11/05/2020   Postsurgical menopause 06/09/2020   Breast cancer screening 05/04/2020   Loss of perception for taste 10/19/2017   Essential hypertension 03/05/2017   S/P laparoscopic cholecystectomy 03/05/2017   Ptosis, both eyelids 01/04/2017   Complete tear, knee, anterior cruciate ligament  08/28/2016   History of total hip arthroplasty 08/28/2016   Tear of meniscus of knee 08/28/2016   Medicare annual wellness visit, subsequent 07/24/2015   Cerebrovascular small vessel disease 04/24/2015   Benign paroxysmal positional vertigo 04/24/2015   Edema 05/14/2012   Polyarthritis of ankle 04/08/2011   Hypothyroidism 12/18/2010   Low back pain radiating to both legs 12/18/2010   Chronic right hip pain 12/18/2010   Obesity 05/09/2010   CAD (coronary artery disease) 05/09/2010   Hyperlipidemia 05/09/2010   PCP: Thersia Flax, MD  REFERRING PROVIDER: Tara Fanti., MD  REFERRING DIAG: [phone] (ICD-10-CM) - Presence of left artificial hip joint   THERAPY DIAG:  Pain in left hip  Stiffness of left hip, not elsewhere classified  Gait difficulty  Muscle weakness (generalized)  Decreased activity tolerance  Rationale for Evaluation and Treatment: Rehabilitation  ONSET DATE: 09/07/2022  SUBJECTIVE:   SUBJECTIVE STATEMENT: Pt reports to PT with c/c of L lateral hip/ piriformis pain with increase walking/ climbing stairs/ household tasks/ sleeping position.  Pt had L hip replaced on 07/12/2022, which subsequently got infected on 08/01/2022. Pt. Worked hard with PT in past and returns today with increase c/o L hip/piriformis pain symptoms.  Pt. Prescribed Celebrex  but has not started medication. She denies any numbness/tingling or clicking/popping.   PERTINENT HISTORY: Hx of bilateral THA (2017), R TKA (2021), CAD, HTN  PAIN:  Are you having pain? Yes: NPRS scale: 3/10 currently, >7/10 at worst Pain location: L lateral hip Pain description: sore/ tight Aggravating factors: Walking Relieving factors: Rest, wine  PRECAUTIONS: None  RED FLAGS: None   WEIGHT BEARING RESTRICTIONS: No  FALLS:  Has patient fallen in last 6 months? No falls but several near falls where pt describes losing her balance with forward leaning  LIVING ENVIRONMENT: Lives with: lives with  their daughter Lives in: House/apartment Stairs: Yes: External: 3 steps; can reach both Has following equipment at home: Retail banker - 2 wheeled  OCCUPATION: Retired  PLOF: Independent with household mobility with device   PATIENT GOALS: Get back to walking without pain, get ready for 1 month [place] cruise in December   NEXT MD VISIT: 3rd week of June  OBJECTIVE:   DIAGNOSTIC FINDINGS: N/A  PATIENT SURVEYS:  LEFS: 29 out of 25  COGNITION: Overall cognitive status: Within functional limits for tasks assessed     SENSATION: WFL  MUSCLE LENGTH: Hamstrings: Right 150 deg; Left 150 deg  POSTURE: No Significant postural limitations  LEG LENGTH: 34 inches bilaterally for true leg length (ASIS to medial malleolus)  LOWER EXTREMITY ROM:  Active ROM Right eval Left eval  Hip flexion 115 110  Hip extension 12 8  Hip abduction 32 21  Hip adduction    Hip internal rotation    Hip external rotation    Knee flexion [facility] WFL  Knee extension [facility] WFL  Ankle dorsiflexion WFL WFL   (Blank rows = not tested)  LOWER EXTREMITY MMT:  MMT Right eval Left eval  Hip flexion 3+ 3   Hip extension 4 3+  Hip abduction 4 3  Hip adduction    Hip internal rotation 3+ 3+  Hip external rotation 4- 3+  Knee flexion 5 5  Knee extension 5 5  Ankle dorsiflexion    Ankle plantarflexion    Ankle inversion    Ankle eversion     (Blank rows = not tested)  LOWER EXTREMITY SPECIAL TESTS:  Hip special tests: Portia Brittle (FABER) test: positive on L and (FADIR) test: negative bilaterally  FUNCTIONAL TESTS:  5 times sit to stand: 22.58 seconds (no UE assist).  L hip IR/ foot IV with STS and while sitting in recliner (reported per pt.).    GAIT: Distance walked: in clinic Assistive device utilized: Quad cane small base Level of assistance: Complete Independence Comments: Antalgic gait, (+) trendelenburg noted on L side.  Increase L hip/ piriformis pain with recip.  Stair climbing, esp. Descending.  Pt. Prefers lateral step pattern while descending.    TODAY'S TREATMENT:                                                                                                                              DATE: 07/01/2023  Subjective:  Pt. Reports no L hip pain while walking into PT clinic.  Pt. States she has a  painful area in L posterior hip while climbing stairs and remains tender in L anterior hip/ greater trochanter region.     There.ex.:  Nustep L4 for 5 min. 22 sec. B UE/LE (warm-up).    6" step ups (forward/ lateral)- 10x (requires UE assist during L LE in //-bars).  Supine SLR/ hip adduction with ball and added SAQ (challenged on L).    Supine bridging 10x2.    Walking in //-bars forward/ lateral 5 laps each.  Focus on L LE midline position (marked L hip IR noted)- no issues on R LE.  Mirror feedback.   Walking around PT clinic/ hallway with focus on maintaining BOS and hip symmetry to prevent antalgic gait pattern.      Discussed HEP  Manual tx.:  Supine LE generalized stretches with focus on hamstring/ piriformis.  L hip ER/IR static stretches with holds (as tolerated)- 3x.    STM to L anterior hip/ quad in supine and piriformis in R sidelying position (with/without use of Hypervolt).     NOT TODAY: TRX squats 10x2.     Supine L hip flexor stretches with knee flexed (as tolerated).     PATIENT EDUCATION:  Education details: Pt educated on HEP form and frequency, role of PT going forward Person educated: Patient Education method: Explanation, Demonstration, and Handouts Education comprehension: verbalized understanding and returned demonstration  HOME EXERCISE PROGRAM: Access Code: [email or web address] URL: [email or web address] Date: 06/05/2023 Prepared by: Hazeline Lister  Exercises - Supine Hamstring Stretch  - 2 x daily - 7 x weekly - 1 sets - 3 reps - Supine Figure 4 Piriformis Stretch  - 2 x daily - 7 x weekly - 1 sets - 3 reps  - 20 seconds hold - Supine Piriformis Stretch with Leg Straight  - 2 x daily - 7 x weekly - 1 sets - 3 reps - 20 seconds hold - Supine Bridge  - 2 x daily - 4-5 x weekly - 2 sets - 10 reps - Seated Piriformis Stretch  - 2 x daily - 7 x weekly - 1 sets - 3 reps - 20 seconds hold - Sit to Stand  - 2 x daily - 4-5 x weekly - 2 sets - 10 reps  ASSESSMENT:  CLINICAL IMPRESSION: Increase L hip pain/ muscle weakness noted during L LE step ups (forward/ lateral).  L anterior hip muscle tightness and L greater trochanter tenderness during manual tx.  Pt. Ambulates with slight L antalgic gait pattern with increase distance walked.  (+) tenderness with palpation over L piriformis in R sidelying position/ knee to chest stretches with use of Hypervolt.  Good tx. Tolerance with manual stretches/ STM and use of Hypervolt.  Pt will benefit from continued PT services upon discharge to safely address deficits listed in patient problem list for decreased caregiver assistance and eventual return to PLOF.  OBJECTIVE IMPAIRMENTS: Abnormal gait, difficulty walking, decreased ROM, decreased strength, impaired flexibility, and pain.   ACTIVITY LIMITATIONS: bending, standing, squatting, and locomotion level  PARTICIPATION LIMITATIONS: cleaning, laundry, community activity, and yard work  PERSONAL FACTORS: Age, Time since onset of injury/illness/exacerbation, and 3+ comorbidities: Hx of bilateral THA, hx of R TKA, CAD, HTN are also affecting patient's functional outcome.   REHAB POTENTIAL: Good  CLINICAL DECISION MAKING: Stable/uncomplicated  EVALUATION COMPLEXITY: Low   GOALS:  SHORT TERM GOALS: Target date: 07/03/2023 Pt will be independent and compliant with HEP in order to improve bilateral distal hamstring length by at least 10 degrees for decreased L hip  pain. Baseline: R/L 90-90 hamstring length = 150 degrees Goal status: Partially met  LONG TERM GOALS: Target date: 07/31/2023  Pt will increase LEFS to  >50 in order to demonstrate subjective improvement in pain and ADL performance. Baseline: Initial 29 Goal status: INITIAL  2.  Pt will increase L hip AROM by at least 10 degrees in all planes to improve function with squatting/bending activities. Baseline: See above for initial ROM measurements  Goal status: INITIAL  3.  Pt will improve bilateral hip MMT scores to at least 4/5 in order to show improved hip musculature strength needed for improved walking/activity tolerance. Baseline: See above for initial MMT scores Goal status: INITIAL  4.  Pt will decrease 5TSTS by at least 3 seconds in order to demonstrate clinically significant improvement in LE strength. Baseline: see above Goal status: INITIAL  5.  Pt will report decrease pain by at least 3 points on NPS scale with walking in order to show improved walking tolerance. Baseline: >6/10 NPS scale with walking on initial evaluation Goal status: INITIAL  6.  Pt will be able to climb ladder with no hip limitations in order to be able to go on Cruise ship at end of year.   Baseline: unable Goal status: INITIAL  PLAN:  PT FREQUENCY: 2x/week  PT DURATION: 8 weeks  PLANNED INTERVENTIONS: Therapeutic exercises, Therapeutic activity, Neuromuscular re-education, Balance training, Self Care, Joint mobilization, Cryotherapy, Moist heat, Manual therapy, and Re-evaluation  PLAN FOR NEXT SESSION: CHECK goals/ discuss POC and schedule.    Lendell Quarry, PT, DPT # [phone] 07/01/2023, 6:39 PM

## 2023-07-03 ENCOUNTER — Encounter: Admitting: Physical Therapy

## 2023-07-04 ENCOUNTER — Encounter: Payer: Self-pay | Admitting: Physical Therapy

## 2023-07-04 ENCOUNTER — Ambulatory Visit: Admitting: Physical Therapy

## 2023-07-04 DIAGNOSIS — R6889 Other general symptoms and signs: Secondary | ICD-10-CM

## 2023-07-04 DIAGNOSIS — M25652 Stiffness of left hip, not elsewhere classified: Secondary | ICD-10-CM

## 2023-07-04 DIAGNOSIS — M6281 Muscle weakness (generalized): Secondary | ICD-10-CM

## 2023-07-04 DIAGNOSIS — M25552 Pain in left hip: Secondary | ICD-10-CM | POA: Diagnosis not present

## 2023-07-04 DIAGNOSIS — R269 Unspecified abnormalities of gait and mobility: Secondary | ICD-10-CM

## 2023-07-04 NOTE — Therapy (Signed)
 OUTPATIENT PHYSICAL THERAPY LOWER EXTREMITY TREATMENT  Patient Name: Meagan Cox MRN: [id number] DOB:1940-08-11, 83 y.o., female Today's Date: 07/04/2023  END OF SESSION:  PT End of Session - 07/04/23 1113     Visit Number 8    Number of Visits 16    Date for PT Re-Evaluation 07/31/23    PT Start Time 1113    PT Stop Time 1154    PT Time Calculation (min) 41 min    Activity Tolerance Patient tolerated treatment well    Behavior During Therapy WFL for tasks assessed/performed            Past Medical History:  Diagnosis Date   Hyperlipidemia    Hypertension    Hypothyroidism    Past Surgical History:  Procedure Laterality Date   ABDOMINAL HYSTERECTOMY     CARDIAC CATHETERIZATION     30 years ago, [place]   CHOLECYSTECTOMY N/A 08/21/2015   Procedure: LAPAROSCOPIC CHOLECYSTECTOMY;  Surgeon: Alben Alma, MD;  Location: [facility];  Service: General;  Laterality: N/A;   EYE SURGERY     eye lid lift   KNEE ARTHROSCOPY Right 08/28/2018   Procedure: ARTHROSCOPY KNEE;  Surgeon: Jerlyn Moons, MD;  Location: [facility];  Service: Orthopedics;  Laterality: Right;   PARTIAL HYSTERECTOMY     REPLACEMENT TOTAL KNEE Right 2021   [facility]ers   right hip replacement Right 2017   [facility]   TONSILLECTOMY     TOTAL HIP ARTHROPLASTY Left    Pt reported left hip replacement done in June   Patient Active Problem List   Diagnosis Date Noted   Hypervitaminosis D 12/20/2022   Cough in adult patient 12/20/2022   Encounter for preventive care 12/30/2021   Asymptomatic postmenopausal estrogen deficiency 12/28/2021   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   Aortic atherosclerosis (HCC) 11/05/2020   Postsurgical menopause 06/09/2020   Breast cancer screening 05/04/2020   Loss of perception for taste 10/19/2017   Essential hypertension 03/05/2017   S/P laparoscopic cholecystectomy 03/05/2017   Ptosis, both eyelids 01/04/2017   Complete tear, knee, anterior cruciate ligament  08/28/2016   History of total hip arthroplasty 08/28/2016   Tear of meniscus of knee 08/28/2016   Medicare annual wellness visit, subsequent 07/24/2015   Cerebrovascular small vessel disease 04/24/2015   Benign paroxysmal positional vertigo 04/24/2015   Edema 05/14/2012   Polyarthritis of ankle 04/08/2011   Hypothyroidism 12/18/2010   Low back pain radiating to both legs 12/18/2010   Chronic right hip pain 12/18/2010   Obesity 05/09/2010   CAD (coronary artery disease) 05/09/2010   Hyperlipidemia 05/09/2010   PCP: Thersia Flax, MD  REFERRING PROVIDER: Tara Fanti., MD  REFERRING DIAG: [phone] (ICD-10-CM) - Presence of left artificial hip joint   THERAPY DIAG:  Pain in left hip  Stiffness of left hip, not elsewhere classified  Gait difficulty  Muscle weakness (generalized)  Decreased activity tolerance  Rationale for Evaluation and Treatment: Rehabilitation  ONSET DATE: 09/07/2022  SUBJECTIVE:   SUBJECTIVE STATEMENT: Pt reports to PT with c/c of L lateral hip/ piriformis pain with increase walking/ climbing stairs/ household tasks/ sleeping position.  Pt had L hip replaced on 07/12/2022, which subsequently got infected on 08/01/2022. Pt. Worked hard with PT in past and returns today with increase c/o L hip/piriformis pain symptoms.  Pt. Prescribed Celebrex  but has not started medication. She denies any numbness/tingling or clicking/popping.   PERTINENT HISTORY: Hx of bilateral THA (2017), R TKA (2021), CAD, HTN  PAIN:  Are you having pain? Yes: NPRS scale: 3/10 currently, >7/10 at worst Pain location: L lateral hip Pain description: sore/ tight Aggravating factors: Walking Relieving factors: Rest, wine  PRECAUTIONS: None  RED FLAGS: None   WEIGHT BEARING RESTRICTIONS: No  FALLS:  Has patient fallen in last 6 months? No falls but several near falls where pt describes losing her balance with forward leaning  LIVING ENVIRONMENT: Lives with: lives with  their daughter Lives in: House/apartment Stairs: Yes: External: 3 steps; can reach both Has following equipment at home: Retail banker - 2 wheeled  OCCUPATION: Retired  PLOF: Independent with household mobility with device   PATIENT GOALS: Get back to walking without pain, get ready for 1 month [place] cruise in December   NEXT MD VISIT: 3rd week of June  OBJECTIVE:   DIAGNOSTIC FINDINGS: N/A  PATIENT SURVEYS:  LEFS: 29 out of 79  COGNITION: Overall cognitive status: Within functional limits for tasks assessed     SENSATION: WFL  MUSCLE LENGTH: Hamstrings: Right 150 deg; Left 150 deg  POSTURE: No Significant postural limitations  LEG LENGTH: 34 inches bilaterally for true leg length (ASIS to medial malleolus)  LOWER EXTREMITY ROM:  Active ROM Right eval Left eval  Hip flexion 115 110  Hip extension 12 8  Hip abduction 32 21  Hip adduction    Hip internal rotation    Hip external rotation    Knee flexion [facility] WFL  Knee extension [facility] WFL  Ankle dorsiflexion WFL WFL   (Blank rows = not tested)  LOWER EXTREMITY MMT:  MMT Right eval Left eval  Hip flexion 3+ 3   Hip extension 4 3+  Hip abduction 4 3  Hip adduction    Hip internal rotation 3+ 3+  Hip external rotation 4- 3+  Knee flexion 5 5  Knee extension 5 5  Ankle dorsiflexion    Ankle plantarflexion    Ankle inversion    Ankle eversion     (Blank rows = not tested)  LOWER EXTREMITY SPECIAL TESTS:  Hip special tests: Portia Brittle (FABER) test: positive on L and (FADIR) test: negative bilaterally  FUNCTIONAL TESTS:  5 times sit to stand: 22.58 seconds (no UE assist).  L hip IR/ foot IV with STS and while sitting in recliner (reported per pt.).    GAIT: Distance walked: in clinic Assistive device utilized: Quad cane small base Level of assistance: Complete Independence Comments: Antalgic gait, (+) trendelenburg noted on L side.  Increase L hip/ piriformis pain with recip.  Stair climbing, esp. Descending.  Pt. Prefers lateral step pattern while descending.    TODAY'S TREATMENT:                                                                                                                              DATE: 07/04/2023  Subjective:  Pt. Reports no L hip pain while walking into PT clinic.  Pt. States she has a  painful area in L posterior hip while descending stairs, not ascending.       Manual tx.:  Supine LE generalized stretches with focus on hamstring (proximal/ distal)/ piriformis/ ITB/ L hip ER/IR static stretches with holds (as tolerated)- 3x.  Pain tolerable range with holds.     STM to L anterior hip/ quad in supine and piriformis in R sidelying position (with/without use of Hypervolt).  Use of bolster under knees.  There.act.:  Supine bolster bridging 10x/ bicycles 10x/ hip abduction with manual resistance 10x/ hip adduction with manual feedback 10x.      6" step ups (forward/ backwards)- 10x (requires UE assist during L LE in //-bars).  Walking in //-bars forward/ backwards 3 laps each.  Focus on L LE midline position (marked L hip IR noted)- no issues on R LE.  Mirror feedback.   Walking around PT clinic/ hallway with focus on maintaining BOS and hip symmetry to prevent antalgic gait pattern.     Ascending/descending stairs with recip. Pattern.  Increase L anterior hip pain/ "catching" during R LE step downs.  No change in pain with lower leg/foot IR or ER.     Discussed HEP   NOT TODAY: TRX squats 10x2.     Supine L hip flexor stretches with knee flexed (as tolerated).    Nustep L4 for 5 min. 22 sec. B UE/LE (warm-up).    PATIENT EDUCATION:  Education details: Pt educated on HEP form and frequency, role of PT going forward Person educated: Patient Education method: Explanation, Demonstration, and Handouts Education comprehension: verbalized understanding and returned demonstration  HOME EXERCISE PROGRAM: Access Code: [email or web address] URL:  [email or web address] Date: 06/05/2023 Prepared by: Hazeline Lister  Exercises - Supine Hamstring Stretch  - 2 x daily - 7 x weekly - 1 sets - 3 reps - Supine Figure 4 Piriformis Stretch  - 2 x daily - 7 x weekly - 1 sets - 3 reps - 20 seconds hold - Supine Piriformis Stretch with Leg Straight  - 2 x daily - 7 x weekly - 1 sets - 3 reps - 20 seconds hold - Supine Bridge  - 2 x daily - 4-5 x weekly - 2 sets - 10 reps - Seated Piriformis Stretch  - 2 x daily - 7 x weekly - 1 sets - 3 reps - 20 seconds hold - Sit to Stand  - 2 x daily - 4-5 x weekly - 2 sets - 10 reps  ASSESSMENT:  CLINICAL IMPRESSION: L anterior hip muscle tightness remains and L greater trochanter tenderness during manual tx.  Pt. Tolerates use of Hypervolt with several episodes of increase pain over L ITB/ piriformis.  Pt. Ambulates with slight L antalgic gait pattern with increase distance walked.  Pt. Has minimal to no L hip pain while ascending stairs but marked increase in pain with descending stairs.  Good tx. Tolerance with manual stretches/ STM and use of Hypervolt.  Pt will benefit from continued PT services upon discharge to safely address deficits listed in patient problem list for decreased caregiver assistance and eventual return to PLOF.  OBJECTIVE IMPAIRMENTS: Abnormal gait, difficulty walking, decreased ROM, decreased strength, impaired flexibility, and pain.   ACTIVITY LIMITATIONS: bending, standing, squatting, and locomotion level  PARTICIPATION LIMITATIONS: cleaning, laundry, community activity, and yard work  PERSONAL FACTORS: Age, Time since onset of injury/illness/exacerbation, and 3+ comorbidities: Hx of bilateral THA, hx of R TKA, CAD, HTN are also affecting patient's functional outcome.   REHAB POTENTIAL: Good  CLINICAL DECISION  MAKING: Stable/uncomplicated  EVALUATION COMPLEXITY: Low   GOALS:  SHORT TERM GOALS: Target date: 07/03/2023 Pt will be independent and compliant with HEP  in order to improve bilateral distal hamstring length by at least 10 degrees for decreased L hip pain. Baseline: R/L 90-90 hamstring length = 150 degrees Goal status: Partially met  LONG TERM GOALS: Target date: 07/31/2023  Pt will increase LEFS to >50 in order to demonstrate subjective improvement in pain and ADL performance. Baseline: Initial 29 Goal status: INITIAL  2.  Pt will increase L hip AROM by at least 10 degrees in all planes to improve function with squatting/bending activities. Baseline: See above for initial ROM measurements  Goal status: INITIAL  3.  Pt will improve bilateral hip MMT scores to at least 4/5 in order to show improved hip musculature strength needed for improved walking/activity tolerance. Baseline: See above for initial MMT scores Goal status: INITIAL  4.  Pt will decrease 5TSTS by at least 3 seconds in order to demonstrate clinically significant improvement in LE strength. Baseline: see above Goal status: INITIAL  5.  Pt will report decrease pain by at least 3 points on NPS scale with walking in order to show improved walking tolerance. Baseline: >6/10 NPS scale with walking on initial evaluation Goal status: INITIAL  6.  Pt will be able to climb ladder with no hip limitations in order to be able to go on Cruise ship at end of year.   Baseline: unable Goal status: INITIAL  PLAN:  PT FREQUENCY: 2x/week  PT DURATION: 8 weeks  PLANNED INTERVENTIONS: Therapeutic exercises, Therapeutic activity, Neuromuscular re-education, Balance training, Self Care, Joint mobilization, Cryotherapy, Moist heat, Manual therapy, and Re-evaluation  PLAN FOR NEXT SESSION: CHECK goals/ discuss POC and schedule.    Lendell Quarry, PT, DPT # [phone] 07/04/2023, 12:32 PM

## 2023-07-08 ENCOUNTER — Ambulatory Visit: Admitting: Physical Therapy

## 2023-07-08 ENCOUNTER — Encounter: Payer: Self-pay | Admitting: Physical Therapy

## 2023-07-08 DIAGNOSIS — R269 Unspecified abnormalities of gait and mobility: Secondary | ICD-10-CM

## 2023-07-08 DIAGNOSIS — M25652 Stiffness of left hip, not elsewhere classified: Secondary | ICD-10-CM

## 2023-07-08 DIAGNOSIS — M6281 Muscle weakness (generalized): Secondary | ICD-10-CM

## 2023-07-08 DIAGNOSIS — M25552 Pain in left hip: Secondary | ICD-10-CM

## 2023-07-08 DIAGNOSIS — R6889 Other general symptoms and signs: Secondary | ICD-10-CM

## 2023-07-08 NOTE — Therapy (Signed)
 OUTPATIENT PHYSICAL THERAPY LOWER EXTREMITY TREATMENT  Patient Name: Meagan Cox MRN: [id number] DOB:1940-04-11, 83 y.o., female Today's Date: 07/08/2023  END OF SESSION:  PT End of Session - 07/08/23 1101     Visit Number 9    Number of Visits 16    Date for PT Re-Evaluation 07/31/23    PT Start Time 1111    PT Stop Time 1205    PT Time Calculation (min) 54 min    Activity Tolerance Patient tolerated treatment well    Behavior During Therapy WFL for tasks assessed/performed            Past Medical History:  Diagnosis Date   Hyperlipidemia    Hypertension    Hypothyroidism    Past Surgical History:  Procedure Laterality Date   ABDOMINAL HYSTERECTOMY     CARDIAC CATHETERIZATION     30 years ago, [place]   CHOLECYSTECTOMY N/A 08/21/2015   Procedure: LAPAROSCOPIC CHOLECYSTECTOMY;  Surgeon: Alben Alma, MD;  Location: [facility];  Service: General;  Laterality: N/A;   EYE SURGERY     eye lid lift   KNEE ARTHROSCOPY Right 08/28/2018   Procedure: ARTHROSCOPY KNEE;  Surgeon: Jerlyn Moons, MD;  Location: [facility];  Service: Orthopedics;  Laterality: Right;   PARTIAL HYSTERECTOMY     REPLACEMENT TOTAL KNEE Right 2021   Bowers   right hip replacement Right 2017   Bowers   TONSILLECTOMY     TOTAL HIP ARTHROPLASTY Left    Pt reported left hip replacement done in June   Patient Active Problem List   Diagnosis Date Noted   Hypervitaminosis D 12/20/2022   Cough in adult patient 12/20/2022   Encounter for preventive care 12/30/2021   Asymptomatic postmenopausal estrogen deficiency 12/28/2021   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   Aortic atherosclerosis (HCC) 11/05/2020   Postsurgical menopause 06/09/2020   Breast cancer screening 05/04/2020   Loss of perception for taste 10/19/2017   Essential hypertension 03/05/2017   S/P laparoscopic cholecystectomy 03/05/2017   Ptosis, both eyelids 01/04/2017   Complete tear, knee, anterior cruciate ligament  08/28/2016   History of total hip arthroplasty 08/28/2016   Tear of meniscus of knee 08/28/2016   Medicare annual wellness visit, subsequent 07/24/2015   Cerebrovascular small vessel disease 04/24/2015   Benign paroxysmal positional vertigo 04/24/2015   Edema 05/14/2012   Polyarthritis of ankle 04/08/2011   Hypothyroidism 12/18/2010   Low back pain radiating to both legs 12/18/2010   Chronic right hip pain 12/18/2010   Obesity 05/09/2010   CAD (coronary artery disease) 05/09/2010   Hyperlipidemia 05/09/2010   PCP: Thersia Flax, MD  REFERRING PROVIDER: Tara Fanti., MD  REFERRING DIAG: [phone] (ICD-10-CM) - Presence of left artificial hip joint   THERAPY DIAG:  Pain in left hip  Stiffness of left hip, not elsewhere classified  Gait difficulty  Muscle weakness (generalized)  Decreased activity tolerance  Rationale for Evaluation and Treatment: Rehabilitation  ONSET DATE: 09/07/2022  SUBJECTIVE:   SUBJECTIVE STATEMENT: Pt reports to PT with c/c of L lateral hip/ piriformis pain with increase walking/ climbing stairs/ household tasks/ sleeping position.  Pt had L hip replaced on 07/12/2022, which subsequently got infected on 08/01/2022. Pt. Worked hard with PT in past and returns today with increase c/o L hip/piriformis pain symptoms.  Pt. Prescribed Celebrex  but has not started medication. She denies any numbness/tingling or clicking/popping.   PERTINENT HISTORY: Hx of bilateral THA (2017), R TKA (2021), CAD, HTN  PAIN:  Are you having pain? Yes: NPRS scale: 3/10 currently, >7/10 at worst Pain location: L lateral hip Pain description: sore/ tight Aggravating factors: Walking Relieving factors: Rest, wine  PRECAUTIONS: None  RED FLAGS: None   WEIGHT BEARING RESTRICTIONS: No  FALLS:  Has patient fallen in last 6 months? No falls but several near falls where pt describes losing her balance with forward leaning  LIVING ENVIRONMENT: Lives with: lives with  their daughter Lives in: House/apartment Stairs: Yes: External: 3 steps; can reach both Has following equipment at home: Retail banker - 2 wheeled  OCCUPATION: Retired  PLOF: Independent with household mobility with device   PATIENT GOALS: Get back to walking without pain, get ready for 1 month [place] cruise in December   NEXT MD VISIT: 3rd week of June  OBJECTIVE:   DIAGNOSTIC FINDINGS: N/A  PATIENT SURVEYS:  LEFS: 29 out of 74  COGNITION: Overall cognitive status: Within functional limits for tasks assessed     SENSATION: WFL  MUSCLE LENGTH: Hamstrings: Right 150 deg; Left 150 deg  POSTURE: No Significant postural limitations  LEG LENGTH: 34 inches bilaterally for true leg length (ASIS to medial malleolus)  LOWER EXTREMITY ROM:  Active ROM Right eval Left eval  Hip flexion 115 110  Hip extension 12 8  Hip abduction 32 21  Hip adduction    Hip internal rotation    Hip external rotation    Knee flexion [facility] WFL  Knee extension [facility] WFL  Ankle dorsiflexion WFL WFL   (Blank rows = not tested)  LOWER EXTREMITY MMT:  MMT Right eval Left eval  Hip flexion 3+ 3   Hip extension 4 3+  Hip abduction 4 3  Hip adduction    Hip internal rotation 3+ 3+  Hip external rotation 4- 3+  Knee flexion 5 5  Knee extension 5 5  Ankle dorsiflexion    Ankle plantarflexion    Ankle inversion    Ankle eversion     (Blank rows = not tested)  LOWER EXTREMITY SPECIAL TESTS:  Hip special tests: Meagan Cox (FABER) test: positive on L and (FADIR) test: negative bilaterally  FUNCTIONAL TESTS:  5 times sit to stand: 22.58 seconds (no UE assist).  L hip IR/ foot IV with STS and while sitting in recliner (reported per pt.).    GAIT: Distance walked: in clinic Assistive device utilized: Quad cane small base Level of assistance: Complete Independence Comments: Antalgic gait, (+) trendelenburg noted on L side.  Increase L hip/ piriformis pain with recip.  Stair climbing, esp. Descending.  Pt. Prefers lateral step pattern while descending.    TODAY'S TREATMENT:                                                                                                                              DATE: 07/08/2023  Subjective:  Pt. Reports no L hip pain while walking into PT clinic.  Pt. Continues to have L  posterior hip/ piriformis pain with STS and stairs.    LEFS: 58 out of 80.  Marked improvement  Active ROM Right eval Left eval  Hip flexion 119 123  Hip extension  (standing) 26 27  Hip abduction 28 26 (pain)  Hip adduction    Hip internal rotation    Hip external rotation    Knee flexion [facility] WFL  Knee extension [facility]  Ankle dorsiflexion [facility] WFL    There.act.:  5xSTS: 2 reps in 18 sec w/o UE support, d/c due to pain in L piriformis (5/10), pt reports, "we ain't doing that."   Resisted walking in //-bars backwards/ side stepping/ forward 5 laps each.  Mild LOB with side stepping to L, PT reduced resistance.  Banker.   Walking around PT clinic/ hallway with focus on maintaining BOS and hip symmetry to prevent antalgic gait pattern.       Discussed HEP  Manual tx.:  Supine LE generalized stretches with focus on glute/ hamstring (proximal/ distal)/ piriformis/ ITB/ L hip ER/IR static stretches with holds (as tolerated)- 4x.  Pain tolerable range with holds.     STM to L anterior hip/ quad in supine and piriformis in R sidelying position (with/without use of Hypervolt).  Use of bolster under knees.  L anterior hip manual release technique in supine position.     NOT TODAY: TRX squats 10x2.     Supine L hip flexor stretches with knee flexed (as tolerated).    Ascending/descending stairs with recip. Pattern.  Increase L anterior hip pain/ "catching" during R LE step downs.  No change in pain with lower leg/foot IR or ER.   Nustep L4 for 5 min. 22 sec. B UE/LE (warm-up).    PATIENT EDUCATION:  Education details: Pt educated on  HEP form and frequency, role of PT going forward Person educated: Patient Education method: Explanation, Demonstration, and Handouts Education comprehension: verbalized understanding and returned demonstration  HOME EXERCISE PROGRAM: Access Code: [email or web address] URL: [email or web address] Date: 06/05/2023 Prepared by: Hazeline Lister  Exercises - Supine Hamstring Stretch  - 2 x daily - 7 x weekly - 1 sets - 3 reps - Supine Figure 4 Piriformis Stretch  - 2 x daily - 7 x weekly - 1 sets - 3 reps - 20 seconds hold - Supine Piriformis Stretch with Leg Straight  - 2 x daily - 7 x weekly - 1 sets - 3 reps - 20 seconds hold - Supine Bridge  - 2 x daily - 4-5 x weekly - 2 sets - 10 reps - Seated Piriformis Stretch  - 2 x daily - 7 x weekly - 1 sets - 3 reps - 20 seconds hold - Sit to Stand  - 2 x daily - 4-5 x weekly - 2 sets - 10 reps  ASSESSMENT:  CLINICAL IMPRESSION: L anterior hip muscle tightness remains and improvement noted after STM/ manual release technique.  Pt. Ambulates with slight L antalgic gait pattern while walking in hallway and had 2 slight LOB with self-correction.  Pt. Has 1 more PT tx. Session scheduled and PT will discuss decreasing tx. Frequency with more independent HEP focus.  Good tx. Tolerance with manual stretches/ STM and use of Hypervolt.  Pt will benefit from continued PT services upon discharge to safely address deficits listed in patient problem list for decreased caregiver assistance and eventual return to PLOF.  OBJECTIVE IMPAIRMENTS: Abnormal gait, difficulty walking, decreased ROM, decreased strength, impaired flexibility, and pain.   ACTIVITY LIMITATIONS: bending,  standing, squatting, and locomotion level  PARTICIPATION LIMITATIONS: cleaning, laundry, community activity, and yard work  PERSONAL FACTORS: Age, Time since onset of injury/illness/exacerbation, and 3+ comorbidities: Hx of bilateral THA, hx of R TKA, CAD, HTN are also affecting patient's  functional outcome.   REHAB POTENTIAL: Good  CLINICAL DECISION MAKING: Stable/uncomplicated  EVALUATION COMPLEXITY: Low   GOALS:  SHORT TERM GOALS: Target date: 07/03/2023 Pt will be independent and compliant with HEP in order to improve bilateral distal hamstring length by at least 10 degrees for decreased L hip pain. Baseline: R/L 90-90 hamstring length = 150 degrees Goal status: Partially met  LONG TERM GOALS: Target date: 07/31/2023  Pt will increase LEFS to >50 in order to demonstrate subjective improvement in pain and ADL performance. Baseline: Initial 29.  6/9: 58 Goal status: Goal met  2.  Pt will increase L hip AROM by at least 10 degrees in all planes to improve function with squatting/bending activities. Baseline: See above for initial ROM measurements  Goal status: INITIAL  3.  Pt will improve bilateral hip MMT scores to at least 4/5 in order to show improved hip musculature strength needed for improved walking/activity tolerance. Baseline: See above for initial MMT scores Goal status: INITIAL  4.  Pt will decrease 5TSTS by at least 3 seconds in order to demonstrate clinically significant improvement in LE strength. Baseline: see above Goal status: Not met  5.  Pt will report decrease pain by at least 3 points on NPS scale with walking in order to show improved walking tolerance. Baseline: >6/10 NPS scale with walking on initial evaluation Goal status: Partially met  6.  Pt will be able to climb ladder with no hip limitations in order to be able to go on Cruise ship at end of year.   Baseline: unable Goal status: Not met  PLAN:  PT FREQUENCY: 2x/week  PT DURATION: 8 weeks  PLANNED INTERVENTIONS: Therapeutic exercises, Therapeutic activity, Neuromuscular re-education, Balance training, Self Care, Joint mobilization, Cryotherapy, Moist heat, Manual therapy, and Re-evaluation  PLAN FOR NEXT SESSION: Discuss POC and schedule.   Decrease to 1x/week  Lendell Quarry, PT, DPT # [phone] 07/08/2023, 3:40 PM

## 2023-07-10 ENCOUNTER — Ambulatory Visit: Admitting: Physical Therapy

## 2023-07-10 ENCOUNTER — Encounter: Payer: Self-pay | Admitting: Physical Therapy

## 2023-07-10 DIAGNOSIS — M25552 Pain in left hip: Secondary | ICD-10-CM

## 2023-07-10 DIAGNOSIS — M6281 Muscle weakness (generalized): Secondary | ICD-10-CM

## 2023-07-10 DIAGNOSIS — R269 Unspecified abnormalities of gait and mobility: Secondary | ICD-10-CM

## 2023-07-10 DIAGNOSIS — M25652 Stiffness of left hip, not elsewhere classified: Secondary | ICD-10-CM

## 2023-07-10 DIAGNOSIS — R6889 Other general symptoms and signs: Secondary | ICD-10-CM

## 2023-07-10 NOTE — Therapy (Signed)
 OUTPATIENT PHYSICAL THERAPY LOWER EXTREMITY TREATMENT Physical Therapy Progress Note  Dates of reporting period  06/05/23   to   07/10/23   Patient Name: Meagan Cox MRN: [id number] DOB:1940-11-16, 83 y.o., female Today's Date: 07/11/2023  END OF SESSION:  PT End of Session - 07/10/23 1119     Visit Number 10    Number of Visits 16    Date for PT Re-Evaluation 07/31/23    PT Start Time 1113    PT Stop Time 1203    PT Time Calculation (min) 50 min    Activity Tolerance Patient tolerated treatment well    Behavior During Therapy [facility] for tasks assessed/performed         Past Medical History:  Diagnosis Date   Hyperlipidemia    Hypertension    Hypothyroidism    Past Surgical History:  Procedure Laterality Date   ABDOMINAL HYSTERECTOMY     CARDIAC CATHETERIZATION     30 years ago, [place]   CHOLECYSTECTOMY N/A 08/21/2015   Procedure: LAPAROSCOPIC CHOLECYSTECTOMY;  Surgeon: Alben Alma, MD;  Location: [facility];  Service: General;  Laterality: N/A;   EYE SURGERY     eye lid lift   KNEE ARTHROSCOPY Right 08/28/2018   Procedure: ARTHROSCOPY KNEE;  Surgeon: Jerlyn Moons, MD;  Location: [facility];  Service: Orthopedics;  Laterality: Right;   PARTIAL HYSTERECTOMY     REPLACEMENT TOTAL KNEE Right 2021   Bowers   right hip replacement Right 2017   Bowers   TONSILLECTOMY     TOTAL HIP ARTHROPLASTY Left    Pt reported left hip replacement done in June   Patient Active Problem List   Diagnosis Date Noted   Hypervitaminosis D 12/20/2022   Cough in adult patient 12/20/2022   Encounter for preventive care 12/30/2021   Asymptomatic postmenopausal estrogen deficiency 12/28/2021   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   Aortic atherosclerosis (HCC) 11/05/2020   Postsurgical menopause 06/09/2020   Breast cancer screening 05/04/2020   Loss of perception for taste 10/19/2017   Essential hypertension 03/05/2017   S/P laparoscopic cholecystectomy 03/05/2017   Ptosis,  both eyelids 01/04/2017   Complete tear, knee, anterior cruciate ligament 08/28/2016   History of total hip arthroplasty 08/28/2016   Tear of meniscus of knee 08/28/2016   Medicare annual wellness visit, subsequent 07/24/2015   Cerebrovascular small vessel disease 04/24/2015   Benign paroxysmal positional vertigo 04/24/2015   Edema 05/14/2012   Polyarthritis of ankle 04/08/2011   Hypothyroidism 12/18/2010   Low back pain radiating to both legs 12/18/2010   Chronic right hip pain 12/18/2010   Obesity 05/09/2010   CAD (coronary artery disease) 05/09/2010   Hyperlipidemia 05/09/2010   PCP: Thersia Flax, MD  REFERRING PROVIDER: Tara Fanti., MD  REFERRING DIAG: [phone] (ICD-10-CM) - Presence of left artificial hip joint   THERAPY DIAG:  Pain in left hip  Stiffness of left hip, not elsewhere classified  Gait difficulty  Muscle weakness (generalized)  Decreased activity tolerance  Rationale for Evaluation and Treatment: Rehabilitation  ONSET DATE: 09/07/2022  SUBJECTIVE:   SUBJECTIVE STATEMENT: Pt reports to PT with c/c of L lateral hip/ piriformis pain with increase walking/ climbing stairs/ household tasks/ sleeping position.  Pt had L hip replaced on 07/12/2022, which subsequently got infected on 08/01/2022. Pt. Worked hard with PT in past and returns today with increase c/o L hip/piriformis pain symptoms.  Pt. Prescribed Celebrex  but has not started medication. She denies any numbness/tingling or clicking/popping.  PERTINENT HISTORY: Hx of bilateral THA (2017), R TKA (2021), CAD, HTN  PAIN:  Are you having pain? Yes: NPRS scale: 3/10 currently, >7/10 at worst Pain location: L lateral hip Pain description: sore/ tight Aggravating factors: Walking Relieving factors: Rest, wine  PRECAUTIONS: None  RED FLAGS: None   WEIGHT BEARING RESTRICTIONS: No  FALLS:  Has patient fallen in last 6 months? No falls but several near falls where pt describes losing her  balance with forward leaning  LIVING ENVIRONMENT: Lives with: lives with their daughter Lives in: House/apartment Stairs: Yes: External: 3 steps; can reach both Has following equipment at home: Retail banker - 2 wheeled  OCCUPATION: Retired  PLOF: Independent with household mobility with device   PATIENT GOALS: Get back to walking without pain, get ready for 1 month [place] cruise in December   NEXT MD VISIT: 3rd week of June  OBJECTIVE:   DIAGNOSTIC FINDINGS: N/A  PATIENT SURVEYS:  LEFS: 29 out of 34  COGNITION: Overall cognitive status: Within functional limits for tasks assessed     SENSATION: WFL  MUSCLE LENGTH: Hamstrings: Right 150 deg; Left 150 deg  POSTURE: No Significant postural limitations  LEG LENGTH: 34 inches bilaterally for true leg length (ASIS to medial malleolus)  LOWER EXTREMITY ROM:  Active ROM Right eval Left eval  Hip flexion 115 110  Hip extension 12 8  Hip abduction 32 21  Hip adduction    Hip internal rotation    Hip external rotation    Knee flexion [facility] WFL  Knee extension [facility] WFL  Ankle dorsiflexion WFL WFL   (Blank rows = not tested)  LOWER EXTREMITY MMT:  MMT Right eval Left eval  Hip flexion 3+ 3   Hip extension 4 3+  Hip abduction 4 3  Hip adduction    Hip internal rotation 3+ 3+  Hip external rotation 4- 3+  Knee flexion 5 5  Knee extension 5 5  Ankle dorsiflexion    Ankle plantarflexion    Ankle inversion    Ankle eversion     (Blank rows = not tested)  LOWER EXTREMITY SPECIAL TESTS:  Hip special tests: Portia Brittle (FABER) test: positive on L and (FADIR) test: negative bilaterally  FUNCTIONAL TESTS:  5 times sit to stand: 22.58 seconds (no UE assist).  L hip IR/ foot IV with STS and while sitting in recliner (reported per pt.).    GAIT: Distance walked: in clinic Assistive device utilized: Quad cane small base Level of assistance: Complete Independence Comments: Antalgic gait, (+)  trendelenburg noted on L side.  Increase L hip/ piriformis pain with recip. Stair climbing, esp. Descending.  Pt. Prefers lateral step pattern while descending.    LEFS: 58 out of 80.  Marked improvement  Active ROM Right eval Left eval  Hip flexion 119 123  Hip extension  (standing) 26 27  Hip abduction 28 26 (pain)  Hip adduction    Hip internal rotation    Hip external rotation    Knee flexion [facility]  Knee extension [facility]  Ankle dorsiflexion [facility] WFL    TODAY'S TREATMENT:  DATE: 07/11/2023  Subjective:  Pt. Reports no L hip pain while walking into PT clinic.  Pt. States she felt better after manual soft tissue release last tx. Session.    There.act.:  Walking around PT clinic/ hallway with focus on maintaining BOS and hip symmetry to prevent antalgic gait pattern.       Discussed HEP (supine/ standing).   Manual tx.:  Supine LE generalized stretches with focus on glute/ hamstring (proximal/ distal)/ piriformis/ ITB/ L hip ER/IR static stretches with holds (as tolerated)- 3x.  Pain tolerable range with increase static holds.     STM to L anterior hip/ quad in supine and piriformis in R sidelying position (with/without use of Hypervolt).  Use of bolster under knees.  L anterior hip manual release technique in supine position.  Primary focus to L anterior hip with static overpressure/ release.     PATIENT EDUCATION:  Education details: Pt educated on HEP form and frequency, role of PT going forward Person educated: Patient Education method: Explanation, Demonstration, and Handouts Education comprehension: verbalized understanding and returned demonstration  HOME EXERCISE PROGRAM: Access Code: [email or web address] URL: [email or web address] Date: 06/05/2023 Prepared by: Hazeline Lister  Exercises - Supine Hamstring Stretch  - 2 x daily - 7 x  weekly - 1 sets - 3 reps - Supine Figure 4 Piriformis Stretch  - 2 x daily - 7 x weekly - 1 sets - 3 reps - 20 seconds hold - Supine Piriformis Stretch with Leg Straight  - 2 x daily - 7 x weekly - 1 sets - 3 reps - 20 seconds hold - Supine Bridge  - 2 x daily - 4-5 x weekly - 2 sets - 10 reps - Seated Piriformis Stretch  - 2 x daily - 7 x weekly - 1 sets - 3 reps - 20 seconds hold - Sit to Stand  - 2 x daily - 4-5 x weekly - 2 sets - 10 reps  ASSESSMENT:  CLINICAL IMPRESSION: Pt. Benefits from soft tissue release technique to decrease hip pain with walking.  Pt. Will continue with PT over next couple of weeks with focus on manual tx. prior to MD f/u.  Pt. Understanding importance of daily walking and avoiding pain provoking movement pattern (stairs).  Pt will benefit from continued PT services upon discharge to safely address deficits listed in patient problem list for decreased caregiver assistance and eventual return to PLOF.  OBJECTIVE IMPAIRMENTS: Abnormal gait, difficulty walking, decreased ROM, decreased strength, impaired flexibility, and pain.   ACTIVITY LIMITATIONS: bending, standing, squatting, and locomotion level  PARTICIPATION LIMITATIONS: cleaning, laundry, community activity, and yard work  PERSONAL FACTORS: Age, Time since onset of injury/illness/exacerbation, and 3+ comorbidities: Hx of bilateral THA, hx of R TKA, CAD, HTN are also affecting patient's functional outcome.   REHAB POTENTIAL: Good  CLINICAL DECISION MAKING: Stable/uncomplicated  EVALUATION COMPLEXITY: Low   GOALS:  SHORT TERM GOALS: Target date: 07/03/2023 Pt will be independent and compliant with HEP in order to improve bilateral distal hamstring length by at least 10 degrees for decreased L hip pain. Baseline: R/L 90-90 hamstring length = 150 degrees Goal status: Partially met  LONG TERM GOALS: Target date: 07/31/2023  Pt will increase LEFS to >50 in order to demonstrate subjective improvement in pain  and ADL performance. Baseline: Initial 29.  6/9: 58 Goal status: Goal met  2.  Pt will increase L hip AROM by at least 10 degrees in all planes to improve function with squatting/bending activities. Baseline: See above  for initial ROM measurements  Goal status: INITIAL  3.  Pt will improve bilateral hip MMT scores to at least 4/5 in order to show improved hip musculature strength needed for improved walking/activity tolerance. Baseline: See above for initial MMT scores Goal status: INITIAL  4.  Pt will decrease 5TSTS by at least 3 seconds in order to demonstrate clinically significant improvement in LE strength. Baseline: see above Goal status: Not met  5.  Pt will report decrease pain by at least 3 points on NPS scale with walking in order to show improved walking tolerance. Baseline: >6/10 NPS scale with walking on initial evaluation Goal status: Partially met  6.  Pt will be able to climb ladder with no hip limitations in order to be able to go on Cruise ship at end of year.   Baseline: unable Goal status: Not met  PLAN:  PT FREQUENCY: 2x/week  PT DURATION: 8 weeks  PLANNED INTERVENTIONS: Therapeutic exercises, Therapeutic activity, Neuromuscular re-education, Balance training, Self Care, Joint mobilization, Cryotherapy, Moist heat, Manual therapy, and Re-evaluation  PLAN FOR NEXT SESSION: Manual release technique  Lendell Quarry, PT, DPT # [phone] 07/11/2023, 12:51 PM

## 2023-07-18 ENCOUNTER — Encounter: Payer: Self-pay | Admitting: Physical Therapy

## 2023-07-18 ENCOUNTER — Ambulatory Visit: Admitting: Physical Therapy

## 2023-07-18 DIAGNOSIS — R269 Unspecified abnormalities of gait and mobility: Secondary | ICD-10-CM

## 2023-07-18 DIAGNOSIS — M6281 Muscle weakness (generalized): Secondary | ICD-10-CM

## 2023-07-18 DIAGNOSIS — R6889 Other general symptoms and signs: Secondary | ICD-10-CM

## 2023-07-18 DIAGNOSIS — Z96642 Presence of left artificial hip joint: Secondary | ICD-10-CM

## 2023-07-18 DIAGNOSIS — M25552 Pain in left hip: Secondary | ICD-10-CM | POA: Diagnosis not present

## 2023-07-18 DIAGNOSIS — M25652 Stiffness of left hip, not elsewhere classified: Secondary | ICD-10-CM

## 2023-07-18 DIAGNOSIS — M2560 Stiffness of unspecified joint, not elsewhere classified: Secondary | ICD-10-CM

## 2023-07-18 DIAGNOSIS — R2689 Other abnormalities of gait and mobility: Secondary | ICD-10-CM

## 2023-07-18 NOTE — Therapy (Signed)
 OUTPATIENT PHYSICAL THERAPY LOWER EXTREMITY TREATMENT  Patient Name: Meagan Cox MRN: [id number] DOB:1940/03/21, 83 y.o., female Today's Date: 07/18/2023  END OF SESSION:  PT End of Session - 07/18/23 1115     Visit Number 11    Number of Visits 16    Date for PT Re-Evaluation 07/31/23    PT Start Time 1115    PT Stop Time 1155    PT Time Calculation (min) 40 min    Activity Tolerance Patient tolerated treatment well    Behavior During Therapy WFL for tasks assessed/performed         Past Medical History:  Diagnosis Date   Hyperlipidemia    Hypertension    Hypothyroidism    Past Surgical History:  Procedure Laterality Date   ABDOMINAL HYSTERECTOMY     CARDIAC CATHETERIZATION     30 years ago, [place]   CHOLECYSTECTOMY N/A 08/21/2015   Procedure: LAPAROSCOPIC CHOLECYSTECTOMY;  Surgeon: Alben Alma, MD;  Location: [facility];  Service: General;  Laterality: N/A;   EYE SURGERY     eye lid lift   KNEE ARTHROSCOPY Right 08/28/2018   Procedure: ARTHROSCOPY KNEE;  Surgeon: Jerlyn Moons, MD;  Location: [facility];  Service: Orthopedics;  Laterality: Right;   PARTIAL HYSTERECTOMY     REPLACEMENT TOTAL KNEE Right 2021   Bowers   right hip replacement Right 2017   Bowers   TONSILLECTOMY     TOTAL HIP ARTHROPLASTY Left    Pt reported left hip replacement done in June   Patient Active Problem List   Diagnosis Date Noted   Hypervitaminosis D 12/20/2022   Cough in adult patient 12/20/2022   Encounter for preventive care 12/30/2021   Asymptomatic postmenopausal estrogen deficiency 12/28/2021   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   Aortic atherosclerosis (HCC) 11/05/2020   Postsurgical menopause 06/09/2020   Breast cancer screening 05/04/2020   Loss of perception for taste 10/19/2017   Essential hypertension 03/05/2017   S/P laparoscopic cholecystectomy 03/05/2017   Ptosis, both eyelids 01/04/2017   Complete tear, knee, anterior cruciate ligament 08/28/2016    History of total hip arthroplasty 08/28/2016   Tear of meniscus of knee 08/28/2016   Medicare annual wellness visit, subsequent 07/24/2015   Cerebrovascular small vessel disease 04/24/2015   Benign paroxysmal positional vertigo 04/24/2015   Edema 05/14/2012   Polyarthritis of ankle 04/08/2011   Hypothyroidism 12/18/2010   Low back pain radiating to both legs 12/18/2010   Chronic right hip pain 12/18/2010   Obesity 05/09/2010   CAD (coronary artery disease) 05/09/2010   Hyperlipidemia 05/09/2010   PCP: Thersia Flax, MD  REFERRING PROVIDER: Tara Fanti., MD  REFERRING DIAG: [phone] (ICD-10-CM) - Presence of left artificial hip joint   THERAPY DIAG:  Pain in left hip  Stiffness of left hip, not elsewhere classified  Gait difficulty  Status post total replacement of left hip  Decreased activity tolerance  Muscle weakness (generalized)  Other abnormalities of gait and mobility  Decreased range of motion  Rationale for Evaluation and Treatment: Rehabilitation  ONSET DATE: 09/07/2022  SUBJECTIVE:   SUBJECTIVE STATEMENT: Pt reports to PT with c/c of L lateral hip/ piriformis pain with increase walking/ climbing stairs/ household tasks/ sleeping position.  Pt had L hip replaced on 07/12/2022, which subsequently got infected on 08/01/2022. Pt. Worked hard with PT in past and returns today with increase c/o L hip/piriformis pain symptoms.  Pt. Prescribed Celebrex  but has not started medication. She denies any numbness/tingling or  clicking/popping.   PERTINENT HISTORY: Hx of bilateral THA (2017), R TKA (2021), CAD, HTN  PAIN:  Are you having pain? Yes: NPRS scale: 3/10 currently, >7/10 at worst Pain location: L lateral hip Pain description: sore/ tight Aggravating factors: Walking Relieving factors: Rest, wine  PRECAUTIONS: None  RED FLAGS: None   WEIGHT BEARING RESTRICTIONS: No  FALLS:  Has patient fallen in last 6 months? No falls but several near falls  where pt describes losing her balance with forward leaning  LIVING ENVIRONMENT: Lives with: lives with their daughter Lives in: House/apartment Stairs: Yes: External: 3 steps; can reach both Has following equipment at home: Retail banker - 2 wheeled  OCCUPATION: Retired  PLOF: Independent with household mobility with device   PATIENT GOALS: Get back to walking without pain, get ready for 1 month [place] cruise in December   NEXT MD VISIT: 3rd week of June  OBJECTIVE:   DIAGNOSTIC FINDINGS: N/A  PATIENT SURVEYS:  LEFS: 29 out of 38  COGNITION: Overall cognitive status: Within functional limits for tasks assessed     SENSATION: WFL  MUSCLE LENGTH: Hamstrings: Right 150 deg; Left 150 deg  POSTURE: No Significant postural limitations  LEG LENGTH: 34 inches bilaterally for true leg length (ASIS to medial malleolus)  LOWER EXTREMITY ROM:  Active ROM Right eval Left eval  Hip flexion 115 110  Hip extension 12 8  Hip abduction 32 21  Hip adduction    Hip internal rotation    Hip external rotation    Knee flexion [facility] WFL  Knee extension [facility] WFL  Ankle dorsiflexion WFL WFL   (Blank rows = not tested)  LOWER EXTREMITY MMT:  MMT Right eval Left eval  Hip flexion 3+ 3   Hip extension 4 3+  Hip abduction 4 3  Hip adduction    Hip internal rotation 3+ 3+  Hip external rotation 4- 3+  Knee flexion 5 5  Knee extension 5 5  Ankle dorsiflexion    Ankle plantarflexion    Ankle inversion    Ankle eversion     (Blank rows = not tested)  LOWER EXTREMITY SPECIAL TESTS:  Hip special tests: Portia Brittle (FABER) test: positive on L and (FADIR) test: negative bilaterally  FUNCTIONAL TESTS:  5 times sit to stand: 22.58 seconds (no UE assist).  L hip IR/ foot IV with STS and while sitting in recliner (reported per pt.).    GAIT: Distance walked: in clinic Assistive device utilized: Quad cane small base Level of assistance: Complete  Independence Comments: Antalgic gait, (+) trendelenburg noted on L side.  Increase L hip/ piriformis pain with recip. Stair climbing, esp. Descending.  Pt. Prefers lateral step pattern while descending.    LEFS: 58 out of 80.  Marked improvement  Active ROM Right eval Left eval  Hip flexion 119 123  Hip extension  (standing) 26 27  Hip abduction 28 26 (pain)  Hip adduction    Hip internal rotation    Hip external rotation    Knee flexion [facility]  Knee extension [facility]  Ankle dorsiflexion [facility] WFL    TODAY'S TREATMENT:  DATE: 07/18/2023  Subjective:  Pt. Reports walking is a whole lot better but her L posterior hip still bothers her when ascending stairs or bending over.   There.act.:  Walking around PT clinic/ hallway with focus on maintaining BOS and hip symmetry to prevent antalgic gait pattern.       Discussed HEP (supine/ standing).   Manual tx.:  Supine LLE generalized stretches with focus on glute, proximal/ distal hamstrings, piriformis.  Pain tolerable range with increase static holds.     STM to L distal hamstring in sidelying with hamstring on stretch manually/ L piriformis in R sidelying with hypervolt    PATIENT EDUCATION:  Education details: Pt educated on HEP form and frequency, role of PT going forward Person educated: Patient Education method: Programmer, multimedia, Demonstration, and Handouts Education comprehension: verbalized understanding and returned demonstration  HOME EXERCISE PROGRAM: Access Code: [email or web address] URL: [email or web address] Date: 06/05/2023 Prepared by: Hazeline Lister  Exercises - Supine Hamstring Stretch  - 2 x daily - 7 x weekly - 1 sets - 3 reps - Supine Figure 4 Piriformis Stretch  - 2 x daily - 7 x weekly - 1 sets - 3 reps - 20 seconds hold - Supine Piriformis Stretch with Leg Straight  - 2 x daily - 7  x weekly - 1 sets - 3 reps - 20 seconds hold - Supine Bridge  - 2 x daily - 4-5 x weekly - 2 sets - 10 reps - Seated Piriformis Stretch  - 2 x daily - 7 x weekly - 1 sets - 3 reps - 20 seconds hold - Sit to Stand  - 2 x daily - 4-5 x weekly - 2 sets - 10 reps  ASSESSMENT:  CLINICAL IMPRESSION: Pt. Reports ongoing pain in her L glute with bending, ascending and descending stairs. Pt tolerated treatment well today, reported that she didn't feel much of a stretch with firgure-4/ piriformis stretch. PT offered to do stair training and gait training with SPC, pt refused both. Pt will benefit from continued PT to address difficulty with stair negotiation and bending activities.    OBJECTIVE IMPAIRMENTS: Abnormal gait, difficulty walking, decreased ROM, decreased strength, impaired flexibility, and pain.   ACTIVITY LIMITATIONS: bending, standing, squatting, and locomotion level  PARTICIPATION LIMITATIONS: cleaning, laundry, community activity, and yard work  PERSONAL FACTORS: Age, Time since onset of injury/illness/exacerbation, and 3+ comorbidities: Hx of bilateral THA, hx of R TKA, CAD, HTN are also affecting patient's functional outcome.   REHAB POTENTIAL: Good  CLINICAL DECISION MAKING: Stable/uncomplicated  EVALUATION COMPLEXITY: Low   GOALS:  SHORT TERM GOALS: Target date: 07/03/2023 Pt will be independent and compliant with HEP in order to improve bilateral distal hamstring length by at least 10 degrees for decreased L hip pain. Baseline: R/L 90-90 hamstring length = 150 degrees Goal status: Partially met  LONG TERM GOALS: Target date: 07/31/2023  Pt will increase LEFS to >50 in order to demonstrate subjective improvement in pain and ADL performance. Baseline: Initial 29.  6/9: 58 Goal status: Goal met  2.  Pt will increase L hip AROM by at least 10 degrees in all planes to improve function with squatting/bending activities. Baseline: See above for initial ROM measurements  Goal  status: INITIAL  3.  Pt will improve bilateral hip MMT scores to at least 4/5 in order to show improved hip musculature strength needed for improved walking/activity tolerance. Baseline: See above for initial MMT scores Goal status: INITIAL  4.  Pt will decrease 5TSTS by at  least 3 seconds in order to demonstrate clinically significant improvement in LE strength. Baseline: see above Goal status: Not met  5.  Pt will report decrease pain by at least 3 points on NPS scale with walking in order to show improved walking tolerance. Baseline: >6/10 NPS scale with walking on initial evaluation Goal status: Partially met  6.  Pt will be able to climb ladder with no hip limitations in order to be able to go on Cruise ship at end of year.   Baseline: unable Goal status: Not met  PLAN:  PT FREQUENCY: 2x/week  PT DURATION: 8 weeks  PLANNED INTERVENTIONS: Therapeutic exercises, Therapeutic activity, Neuromuscular re-education, Balance training, Self Care, Joint mobilization, Cryotherapy, Moist heat, Manual therapy, and Re-evaluation  PLAN FOR NEXT SESSION: Manual release technique/ send MD progress note  Lendell Quarry, PT, DPT # [id number] Andi Kaufmann, SPT 07/18/2023, 12:02 PM

## 2023-07-25 ENCOUNTER — Ambulatory Visit: Admitting: Physical Therapy

## 2023-07-25 DIAGNOSIS — M2560 Stiffness of unspecified joint, not elsewhere classified: Secondary | ICD-10-CM

## 2023-07-25 DIAGNOSIS — M25552 Pain in left hip: Secondary | ICD-10-CM | POA: Diagnosis not present

## 2023-07-25 DIAGNOSIS — M25652 Stiffness of left hip, not elsewhere classified: Secondary | ICD-10-CM

## 2023-07-25 DIAGNOSIS — R2689 Other abnormalities of gait and mobility: Secondary | ICD-10-CM

## 2023-07-25 DIAGNOSIS — R269 Unspecified abnormalities of gait and mobility: Secondary | ICD-10-CM

## 2023-07-25 DIAGNOSIS — Z96642 Presence of left artificial hip joint: Secondary | ICD-10-CM

## 2023-07-25 DIAGNOSIS — M6281 Muscle weakness (generalized): Secondary | ICD-10-CM

## 2023-07-25 DIAGNOSIS — R6889 Other general symptoms and signs: Secondary | ICD-10-CM

## 2023-07-25 NOTE — Therapy (Signed)
 OUTPATIENT PHYSICAL THERAPY LOWER EXTREMITY TREATMENT  Patient Name: Meagan Cox MRN: [id number] DOB:11/28/1940, 83 y.o., female Today's Date: 07/25/2023  END OF SESSION:  PT End of Session - 07/25/23 1111     Visit Number 12    Number of Visits 16    Date for PT Re-Evaluation 07/31/23    PT Start Time 1111    PT Stop Time 1202    PT Time Calculation (min) 51 min    Activity Tolerance Patient limited by pain    Behavior During Therapy [facility] for tasks assessed/performed         Past Medical History:  Diagnosis Date   Hyperlipidemia    Hypertension    Hypothyroidism    Past Surgical History:  Procedure Laterality Date   ABDOMINAL HYSTERECTOMY     CARDIAC CATHETERIZATION     30 years ago, [place]   CHOLECYSTECTOMY N/A 08/21/2015   Procedure: LAPAROSCOPIC CHOLECYSTECTOMY;  Surgeon: Laneta JULIANNA Luna, MD;  Location: [facility];  Service: General;  Laterality: N/A;   EYE SURGERY     eye lid lift   KNEE ARTHROSCOPY Right 08/28/2018   Procedure: ARTHROSCOPY KNEE;  Surgeon: Leora Lynwood SAUNDERS, MD;  Location: [facility];  Service: Orthopedics;  Laterality: Right;   PARTIAL HYSTERECTOMY     REPLACEMENT TOTAL KNEE Right 2021   Bowers   right hip replacement Right 2017   Bowers   TONSILLECTOMY     TOTAL HIP ARTHROPLASTY Left    Pt reported left hip replacement done in June   Patient Active Problem List   Diagnosis Date Noted   Hypervitaminosis D 12/20/2022   Cough in adult patient 12/20/2022   Encounter for preventive care 12/30/2021   Asymptomatic postmenopausal estrogen deficiency 12/28/2021   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   Aortic atherosclerosis (HCC) 11/05/2020   Postsurgical menopause 06/09/2020   Breast cancer screening 05/04/2020   Loss of perception for taste 10/19/2017   Essential hypertension 03/05/2017   S/P laparoscopic cholecystectomy 03/05/2017   Ptosis, both eyelids 01/04/2017   Complete tear, knee, anterior cruciate ligament 08/28/2016   History  of total hip arthroplasty 08/28/2016   Tear of meniscus of knee 08/28/2016   Medicare annual wellness visit, subsequent 07/24/2015   Cerebrovascular small vessel disease 04/24/2015   Benign paroxysmal positional vertigo 04/24/2015   Edema 05/14/2012   Polyarthritis of ankle 04/08/2011   Hypothyroidism 12/18/2010   Low back pain radiating to both legs 12/18/2010   Chronic right hip pain 12/18/2010   Obesity 05/09/2010   CAD (coronary artery disease) 05/09/2010   Hyperlipidemia 05/09/2010   PCP: Marylynn Verneita CROME, MD  REFERRING PROVIDER: Leora Lynwood JAYSON Mickey., MD  REFERRING DIAG: [id number] (ICD-10-CM) - Presence of left artificial hip joint   THERAPY DIAG:  Pain in left hip  Stiffness of left hip, not elsewhere classified  Gait difficulty  Muscle weakness (generalized)  Decreased range of motion  Other abnormalities of gait and mobility  Status post total replacement of left hip  Decreased activity tolerance  Rationale for Evaluation and Treatment: Rehabilitation  ONSET DATE: 09/07/2022  SUBJECTIVE:   SUBJECTIVE STATEMENT: Pt reports to PT with c/c of L lateral hip/ piriformis pain with increase walking/ climbing stairs/ household tasks/ sleeping position.  Pt had L hip replaced on 07/12/2022, which subsequently got infected on 08/01/2022. Pt. Worked hard with PT in past and returns today with increase c/o L hip/piriformis pain symptoms.  Pt. Prescribed Celebrex  but has not started medication. She denies any numbness/tingling or  clicking/popping.   PERTINENT HISTORY: Hx of bilateral THA (2017), R TKA (2021), CAD, HTN  PAIN:  Are you having pain? Yes: NPRS scale: 3/10 currently, >7/10 at worst Pain location: L lateral hip Pain description: sore/ tight Aggravating factors: Walking Relieving factors: Rest, wine  PRECAUTIONS: None  RED FLAGS: None   WEIGHT BEARING RESTRICTIONS: No  FALLS:  Has patient fallen in last 6 months? No falls but several near falls where pt  describes losing her balance with forward leaning  LIVING ENVIRONMENT: Lives with: lives with their daughter Lives in: House/apartment Stairs: Yes: External: 3 steps; can reach both Has following equipment at home: Retail banker - 2 wheeled  OCCUPATION: Retired  PLOF: Independent with household mobility with device   PATIENT GOALS: Get back to walking without pain, get ready for 1 month [place] cruise in December   NEXT MD VISIT: 3rd week of June  OBJECTIVE:   DIAGNOSTIC FINDINGS: N/A  PATIENT SURVEYS:  LEFS: 29 out of 55  COGNITION: Overall cognitive status: Within functional limits for tasks assessed     SENSATION: WFL  MUSCLE LENGTH: Hamstrings: Right 150 deg; Left 150 deg  POSTURE: No Significant postural limitations  LEG LENGTH: 34 inches bilaterally for true leg length (ASIS to medial malleolus)  LOWER EXTREMITY ROM:  Active ROM Right eval Left eval  Hip flexion 115 110  Hip extension 12 8  Hip abduction 32 21  Hip adduction    Hip internal rotation    Hip external rotation    Knee flexion [facility] WFL  Knee extension [facility] WFL  Ankle dorsiflexion WFL WFL   (Blank rows = not tested)  LOWER EXTREMITY MMT:  MMT Right eval Left eval  Hip flexion 3+ 3   Hip extension 4 3+  Hip abduction 4 3  Hip adduction    Hip internal rotation 3+ 3+  Hip external rotation 4- 3+  Knee flexion 5 5  Knee extension 5 5  Ankle dorsiflexion    Ankle plantarflexion    Ankle inversion    Ankle eversion     (Blank rows = not tested)  LOWER EXTREMITY SPECIAL TESTS:  Hip special tests: Belvie (FABER) test: positive on L and (FADIR) test: negative bilaterally  FUNCTIONAL TESTS:  5 times sit to stand: 22.58 seconds (no UE assist).  L hip IR/ foot IV with STS and while sitting in recliner (reported per pt.).    GAIT: Distance walked: in clinic Assistive device utilized: Quad cane small base Level of assistance: Complete Independence Comments:  Antalgic gait, (+) trendelenburg noted on L side.  Increase L hip/ piriformis pain with recip. Stair climbing, esp. Descending.  Pt. Prefers lateral step pattern while descending.    LEFS: 58 out of 80.  Marked improvement  Active ROM Right eval Left eval  Hip flexion 119 123  Hip extension  (standing) 26 27  Hip abduction 28 26 (pain)  Hip adduction    Hip internal rotation    Hip external rotation    Knee flexion [facility]  Knee extension [facility]  Ankle dorsiflexion [facility] WFL    TODAY'S TREATMENT:  DATE: 07/25/2023  Subjective:  Pt. Reports L anterior hip pain, L glute pain walking into PT clinic.  Pt. entered clinic holding L anterior hip, and walking with antalgic gait.  Pt. States L hip tightness increased a couple days ago and hasn't relaxed.  Pt. Has MD f/u tomorrow morning at 10:10AM to reassess/ discuss POC.     There.act.: 5xSTS: 23 sec.  (No UE assist) Observed stair mechanics: Pt utilized B handrails and a step through patten when ascending and descending. Pt reported L anterior hip pain when descending. No pain reported on ascending Pain when walking: 3/10 (antalgic gait with lateral sway)  LOWER EXTREMITY ROM:  Active ROM Right eval Left eval  Hip flexion 102 113  Hip extension (prone)  18 *  Hip abduction 24 22  Hip adduction    Hip internal rotation    Hip external rotation    Knee flexion [facility] WFL  Knee extension [facility] WFL  Ankle dorsiflexion WFL WFL   (Blank rows = not tested)  LOWER EXTREMITY MMT:  MMT Right eval Left eval  Hip flexion 3+ 3+  Hip extension 3 *  Hip abduction 4+ *  Hip adduction    Hip internal rotation 4 4- p  Hip external rotation 3+ 3+ p  Knee flexion 5 5  Knee extension 5 5  Ankle dorsiflexion    Ankle plantarflexion    Ankle inversion    Ankle eversion     (Blank rows = not tested) L Hamstring length:  Proximal: 64 deg, Distal: 42 deg  Manual tx.:  Supine/ R sidelying TTP at L piriformis, L hip flexor/ proximal ITB.    Supine LLE generalized stretches with focus on glute, proximal/ distal hamstrings, piriformis.  Pain tolerable range with increase static holds.     STM to L anterior hip in supine with/ without Hypervolt / L piriformis in R sidelying with Hypervolt    PATIENT EDUCATION:  Education details: Pt educated on HEP form and frequency, role of PT going forward Person educated: Patient Education method: Programmer, multimedia, Demonstration, and Handouts Education comprehension: verbalized understanding and returned demonstration  HOME EXERCISE PROGRAM: Access Code: [email or web address] URL: [email or web address] Date: 06/05/2023 Prepared by: Ozell Sero  Exercises - Supine Hamstring Stretch  - 2 x daily - 7 x weekly - 1 sets - 3 reps - Supine Figure 4 Piriformis Stretch  - 2 x daily - 7 x weekly - 1 sets - 3 reps - 20 seconds hold - Supine Piriformis Stretch with Leg Straight  - 2 x daily - 7 x weekly - 1 sets - 3 reps - 20 seconds hold - Supine Bridge  - 2 x daily - 4-5 x weekly - 2 sets - 10 reps - Seated Piriformis Stretch  - 2 x daily - 7 x weekly - 1 sets - 3 reps - 20 seconds hold - Sit to Stand  - 2 x daily - 4-5 x weekly - 2 sets - 10 reps  ASSESSMENT:  CLINICAL IMPRESSION: Pt. Reports ongoing muscle tightness/ pain in L glute and L anterior hip.  Pt. limited by L anterior hip pain when descending stairs, did not report any problems when ascending stairs. ROM testing revealed less flexibility on RLE, more flexibility on LLE compared to eval. Pt reports marked increase in L anterior hip pain lying prone during MMT.  Pt demonstrated slightly improved strength in BLEs as compared to eval, L hip ER/IR provoked piriformis pain. Unable to test L hip extension and abduction  secondary to pain and discomfort with positioning. Pt very TTP in L anterior hip and significant muscle  tightness noted with manual tx./ palpation.  STM relieved L anterior hip tension but trigger point still palpable at end of session. Pt would be unable to climb a ladder at this time due to her pain and weakness.  See updated goals.  Pt. Instructed to contact PT after MD f/u.      OBJECTIVE IMPAIRMENTS: Abnormal gait, difficulty walking, decreased ROM, decreased strength, impaired flexibility, and pain.   ACTIVITY LIMITATIONS: bending, standing, squatting, and locomotion level  PARTICIPATION LIMITATIONS: cleaning, laundry, community activity, and yard work  PERSONAL FACTORS: Age, Time since onset of injury/illness/exacerbation, and 3+ comorbidities: Hx of bilateral THA, hx of R TKA, CAD, HTN are also affecting patient's functional outcome.   REHAB POTENTIAL: Good  CLINICAL DECISION MAKING: Stable/uncomplicated  EVALUATION COMPLEXITY: Low   GOALS:  SHORT TERM GOALS: Target date: 07/03/2023 Pt will be independent and compliant with HEP in order to improve bilateral distal hamstring length by at least 10 degrees for decreased L hip pain. Baseline: R/L 90-90 hamstring length = 150 degrees Goal status: Partially met  LONG TERM GOALS: Target date: 07/31/2023  Pt will increase LEFS to >50 in order to demonstrate subjective improvement in pain and ADL performance. Baseline: Initial 29.  6/9: 58 Goal status: Goal met  2.  Pt will increase L hip AROM by at least 10 degrees in all planes to improve function with squatting/bending activities. Baseline: See above for initial ROM measurements   6/26: see above Goal status: Partially met  3.  Pt will improve bilateral hip MMT scores to at least 4/5 in order to show improved hip musculature strength needed for improved walking/activity tolerance. Baseline: See above for initial MMT scores.  6/26:  pain limted MMT Goal status: Partially met  4.  Pt will decrease 5TSTS by at least 3 seconds in order to demonstrate clinically significant improvement  in LE strength. Baseline: eval: 22.58 sec,  6/26: 23 sec Goal status: Not met  5.  Pt will report decrease pain by at least 3 points on NPS scale with walking in order to show improved walking tolerance. Baseline: >6/10 NPS scale with walking on initial evaluation, 6/26: 3/10 NPS scale when walking on even ground, pt reports is higher when walking on uneven ground Goal status: Partially met  6.  Pt will be able to climb ladder with no hip limitations in order to be able to go on Cruise ship at end of year.   Baseline: unable Goal status: Not met  PLAN:  PT FREQUENCY: 2x/week  PT DURATION: 8 weeks  PLANNED INTERVENTIONS: Therapeutic exercises, Therapeutic activity, Neuromuscular re-education, Balance training, Self Care, Joint mobilization, Cryotherapy, Moist heat, Manual therapy, and Re-evaluation  PLAN FOR NEXT SESSION: Discuss MD f/u appointment/ POC  Ozell JAYSON Sero, PT, DPT # [phone] Cheron Last, SPT 07/25/2023, 2:06 PM

## 2023-07-26 ENCOUNTER — Encounter: Payer: Self-pay | Admitting: Physical Therapy

## 2023-07-26 DIAGNOSIS — Z96642 Presence of left artificial hip joint: Secondary | ICD-10-CM | POA: Diagnosis not present

## 2023-07-26 DIAGNOSIS — M25552 Pain in left hip: Secondary | ICD-10-CM | POA: Diagnosis not present

## 2023-07-31 ENCOUNTER — Other Ambulatory Visit: Payer: Self-pay | Admitting: Internal Medicine

## 2023-07-31 NOTE — Telephone Encounter (Signed)
Spoke with pt and scheduled her for a follow up with Dr. Tullo.  °

## 2023-08-07 ENCOUNTER — Other Ambulatory Visit: Payer: Self-pay | Admitting: Internal Medicine

## 2023-08-08 MED ORDER — METOPROLOL SUCCINATE ER 25 MG PO TB24: 25.0000 mg | ORAL_TABLET | Freq: Every day | ORAL | 0 refills | Status: DC

## 2023-08-08 NOTE — Addendum Note (Signed)
 Addended by: ANICE BELT on: 08/08/2023 04:49 PM   Modules accepted: Orders

## 2023-08-22 ENCOUNTER — Ambulatory Visit: Attending: Orthopedic Surgery | Admitting: Physical Therapy

## 2023-08-22 DIAGNOSIS — R2689 Other abnormalities of gait and mobility: Secondary | ICD-10-CM | POA: Diagnosis not present

## 2023-08-22 DIAGNOSIS — M25552 Pain in left hip: Secondary | ICD-10-CM | POA: Diagnosis not present

## 2023-08-22 DIAGNOSIS — M25652 Stiffness of left hip, not elsewhere classified: Secondary | ICD-10-CM | POA: Insufficient documentation

## 2023-08-22 DIAGNOSIS — M2560 Stiffness of unspecified joint, not elsewhere classified: Secondary | ICD-10-CM | POA: Diagnosis not present

## 2023-08-22 DIAGNOSIS — R269 Unspecified abnormalities of gait and mobility: Secondary | ICD-10-CM | POA: Insufficient documentation

## 2023-08-22 DIAGNOSIS — M6281 Muscle weakness (generalized): Secondary | ICD-10-CM | POA: Insufficient documentation

## 2023-08-22 NOTE — Therapy (Signed)
 OUTPATIENT PHYSICAL THERAPY LOWER EXTREMITY TREATMENT/ RECERTIFICATION  Patient Name: Meagan Cox MRN: [id number] DOB:November 21, 1940, 83 y.o., female Today's Date: 08/22/2023  END OF SESSION:  PT End of Session - 08/22/23 1031     Visit Number 13    PT Start Time 1032    Activity Tolerance Patient limited by pain    Behavior During Therapy [facility] for tasks assessed/performed         13 out of 21.  Recertification date: 12/12/23 1032 to 1113  (41 minutes)  Past Medical History:  Diagnosis Date   Hyperlipidemia    Hypertension    Hypothyroidism    Past Surgical History:  Procedure Laterality Date   ABDOMINAL HYSTERECTOMY     CARDIAC CATHETERIZATION     30 years ago, [place]   CHOLECYSTECTOMY N/A 08/21/2015   Procedure: LAPAROSCOPIC CHOLECYSTECTOMY;  Surgeon: Laneta JULIANNA Luna, MD;  Location: [facility] ORS;  Service: General;  Laterality: N/A;   EYE SURGERY     eye lid lift   KNEE ARTHROSCOPY Right 08/28/2018   Procedure: ARTHROSCOPY KNEE;  Surgeon: Leora Lynwood SAUNDERS, MD;  Location: [facility];  Service: Orthopedics;  Laterality: Right;   PARTIAL HYSTERECTOMY     REPLACEMENT TOTAL KNEE Right 2021   [facility]   right hip replacement Right 2017   [facility]   TONSILLECTOMY     TOTAL HIP ARTHROPLASTY Left    Pt reported left hip replacement done in June   Patient Active Problem List   Diagnosis Date Noted   Hypervitaminosis D 12/20/2022   Cough in adult patient 12/20/2022   Encounter for preventive care 12/30/2021   Asymptomatic postmenopausal estrogen deficiency 12/28/2021   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   Aortic atherosclerosis (HCC) 11/05/2020   Postsurgical menopause 06/09/2020   Breast cancer screening 05/04/2020   Loss of perception for taste 10/19/2017   Essential hypertension 03/05/2017   S/P laparoscopic cholecystectomy 03/05/2017   Ptosis, both eyelids 01/04/2017   Complete tear, knee, anterior cruciate ligament 08/28/2016   History of total hip arthroplasty  08/28/2016   Tear of meniscus of knee 08/28/2016   Medicare annual wellness visit, subsequent 07/24/2015   Cerebrovascular small vessel disease 04/24/2015   Benign paroxysmal positional vertigo 04/24/2015   Edema 05/14/2012   Polyarthritis of ankle 04/08/2011   Hypothyroidism 12/18/2010   Low back pain radiating to both legs 12/18/2010   Chronic right hip pain 12/18/2010   Obesity 05/09/2010   CAD (coronary artery disease) 05/09/2010   Hyperlipidemia 05/09/2010   PCP: Marylynn Verneita CROME, MD  REFERRING PROVIDER: Leora Lynwood JAYSON Mickey., MD  REFERRING DIAG: [phone] (ICD-10-CM) - Presence of left artificial hip joint   THERAPY DIAG:  Pain in left hip  Stiffness of left hip, not elsewhere classified  Gait difficulty  Muscle weakness (generalized)  Decreased range of motion  Other abnormalities of gait and mobility  Rationale for Evaluation and Treatment: Rehabilitation  ONSET DATE: 09/07/2022  SUBJECTIVE:   SUBJECTIVE STATEMENT: Pt reports to PT with c/c of L lateral hip/ piriformis pain with increase walking/ climbing stairs/ household tasks/ sleeping position.  Pt had L hip replaced on 07/12/2022, which subsequently got infected on 08/01/2022. Pt. Worked hard with PT in past and returns today with increase c/o L hip/piriformis pain symptoms.  Pt. Prescribed Celebrex  but has not started medication. She denies any numbness/tingling or clicking/popping.   PERTINENT HISTORY: Hx of bilateral THA (2017), R TKA (2021), CAD, HTN  PAIN:  Are you having pain? Yes: NPRS scale: 3/10 currently, >  7/10 at worst Pain location: L lateral hip Pain description: sore/ tight Aggravating factors: Walking Relieving factors: Rest, wine  PRECAUTIONS: None  RED FLAGS: None   WEIGHT BEARING RESTRICTIONS: No  FALLS:  Has patient fallen in last 6 months? No falls but several near falls where pt describes losing her balance with forward leaning  LIVING ENVIRONMENT: Lives with: lives with their  daughter Lives in: House/apartment Stairs: Yes: External: 3 steps; can reach both Has following equipment at home: Retail banker - 2 wheeled  OCCUPATION: Retired  PLOF: Independent with household mobility with device   PATIENT GOALS: Get back to walking without pain, get ready for 1 month [place] cruise in December   NEXT MD VISIT: 3rd week of June  OBJECTIVE:   DIAGNOSTIC FINDINGS: N/A  PATIENT SURVEYS:  LEFS: 29 out of 35  COGNITION: Overall cognitive status: Within functional limits for tasks assessed     SENSATION: WFL  MUSCLE LENGTH: Hamstrings: Right 150 deg; Left 150 deg  POSTURE: No Significant postural limitations  LEG LENGTH: 34 inches bilaterally for true leg length (ASIS to medial malleolus)  LOWER EXTREMITY ROM:  Active ROM Right eval Left eval  Hip flexion 115 110  Hip extension 12 8  Hip abduction 32 21  Hip adduction    Hip internal rotation    Hip external rotation    Knee flexion [facility] WFL  Knee extension [facility] WFL  Ankle dorsiflexion WFL WFL   (Blank rows = not tested)  LOWER EXTREMITY MMT:  MMT Right eval Left eval  Hip flexion 3+ 3   Hip extension 4 3+  Hip abduction 4 3  Hip adduction    Hip internal rotation 3+ 3+  Hip external rotation 4- 3+  Knee flexion 5 5  Knee extension 5 5  Ankle dorsiflexion    Ankle plantarflexion    Ankle inversion    Ankle eversion     (Blank rows = not tested)  LOWER EXTREMITY SPECIAL TESTS:  Hip special tests: [place] (FABER) test: positive on L and (FADIR) test: negative bilaterally  FUNCTIONAL TESTS:  5 times sit to stand: 22.58 seconds (no UE assist).  L hip IR/ foot IV with STS and while sitting in recliner (reported per pt.).    GAIT: Distance walked: in clinic Assistive device utilized: Quad cane small base Level of assistance: Complete Independence Comments: Antalgic gait, (+) trendelenburg noted on L side.  Increase L hip/ piriformis pain with recip. Stair  climbing, esp. Descending.  Pt. Prefers lateral step pattern while descending.    LEFS: 58 out of 80.  Marked improvement  Active ROM Right eval Left eval  Hip flexion 119 123  Hip extension  (standing) 26 27  Hip abduction 28 26 (pain)  Hip adduction    Hip internal rotation    Hip external rotation    Knee flexion [facility]  Knee extension [facility]  Ankle dorsiflexion [facility] WFL   5xSTS: 23 sec.  (No UE assist) Observed stair mechanics: Pt utilized B handrails and a step through patten when ascending and descending. Pt reported L anterior hip pain when descending. No pain reported on ascending  TODAY'S TREATMENT:  DATE: 08/22/2023  Subjective:  Pt. Had a cortisone injection in L hip since last PT tx. Session.  Pt. Entered PT with marked improvement in gait pattern and no c/o pain at rest.  Pt. States she does not like exercising but understands that she needs to be more active and strengthen hip/LE.     There.act.:  LEFS: 50 out of 80 (marked improvement from initial evaluation but slight regression from last reassessment).    Gait reassessment in clinic with marked improvement in L LE stance phase of gait without c/o pain.    LOWER EXTREMITY MMT:  MMT Right eval Left eval  Hip flexion 3+ 3+  Hip extension    Hip abduction 4+ 4+ (minimal discomfort)  Hip adduction    Hip internal rotation 4 4  Hip external rotation 3+ 3+  Knee flexion 5 5  Knee extension 5 5  Ankle dorsiflexion    Ankle plantarflexion    Ankle inversion    Ankle eversion     (Blank rows = not tested) L Hamstring length: Proximal: 64 deg, Distal: 52 deg  Manual tx.:  Supine R/L LE stretches (focus on on L hip/ piriformis/ glut stretches).    STM to L anterior hip in supine (remains tight/ thick band of muscle as compared to R).     PATIENT EDUCATION:  Education details: Pt  educated on HEP form and frequency, role of PT going forward Person educated: Patient Education method: Explanation, Demonstration, and Handouts Education comprehension: verbalized understanding and returned demonstration  HOME EXERCISE PROGRAM: Access Code: [email or web address] URL: [email or web address] Date: 06/05/2023 Prepared by: Ozell Sero  Exercises - Supine Hamstring Stretch  - 2 x daily - 7 x weekly - 1 sets - 3 reps - Supine Figure 4 Piriformis Stretch  - 2 x daily - 7 x weekly - 1 sets - 3 reps - 20 seconds hold - Supine Piriformis Stretch with Leg Straight  - 2 x daily - 7 x weekly - 1 sets - 3 reps - 20 seconds hold - Supine Bridge  - 2 x daily - 4-5 x weekly - 2 sets - 10 reps - Seated Piriformis Stretch  - 2 x daily - 7 x weekly - 1 sets - 3 reps - 20 seconds hold - Sit to Stand  - 2 x daily - 4-5 x weekly - 2 sets - 10 reps  ASSESSMENT:  CLINICAL IMPRESSION: Pt. Reports ongoing L anterior hip muscle tightness but less overall hip pain since injection.  Generalized L hip/LE muscle weakness remains and pt. Understands the importance of completing HEP/ daily walking.  STM relieved L anterior hip tension but trigger point still palpable at end of session.  PT discussed decreasing tx. Frequency with focus on HEP.  Pt. Instructed to contact PT if any regression in symptoms or questions.  Pt would be unable to climb a ladder at this time due to her muscle weakness.  See updated goals.  Pt. Will continue to benefit from skilled PT services to increase hip strength to improve pain-free mobility.       OBJECTIVE IMPAIRMENTS: Abnormal gait, difficulty walking, decreased ROM, decreased strength, impaired flexibility, and pain.   ACTIVITY LIMITATIONS: bending, standing, squatting, and locomotion level  PARTICIPATION LIMITATIONS: cleaning, laundry, community activity, and yard work  PERSONAL FACTORS: Age, Time since onset of injury/illness/exacerbation, and 3+ comorbidities: Hx  of bilateral THA, hx of R TKA, CAD, HTN are also affecting patient's functional outcome.   REHAB POTENTIAL: Good  CLINICAL  DECISION MAKING: Stable/uncomplicated  EVALUATION COMPLEXITY: Low   GOALS:  LONG TERM GOALS: Target date: 12/12/2023  Pt will increase LEFS to >60 in order to demonstrate subjective improvement in pain and ADL performance. Baseline: Initial 29.  6/9: 58.  7/24: 50 Goal status: Partially met  2.  Pt will increase L hip AROM by at least 10 degrees in all planes to improve function with squatting/bending activities. Baseline: See above for initial ROM measurements   6/26: see above Goal status: Partially met  3.  Pt will improve bilateral hip MMT scores to at least 4/5 in order to show improved hip musculature strength needed for improved walking/activity tolerance. Baseline: See above for initial MMT scores.  6/26:  pain limted MMT Goal status: Partially met  4.  Pt will decrease 5TSTS by at least 3 seconds in order to demonstrate clinically significant improvement in LE strength. Baseline: eval: 22.58 sec,  6/26: 23 sec Goal status: Not met  5.  Pt will report decrease pain by at least 3 points on NPS scale with walking in order to show improved walking tolerance. Baseline: >6/10 NPS scale with walking on initial evaluation, 6/26: 3/10 NPS scale when walking on even ground, pt reports is higher when walking on uneven ground Goal status: Partially met  6.  Pt will be able to climb ladder with no hip limitations in order to be able to go on Cruise ship at end of year.   Baseline: unable Goal status: Not met  PLAN:  PT FREQUENCY: Biweekly  PT DURATION: 16 weeks  PLANNED INTERVENTIONS: Therapeutic exercises, Therapeutic activity, Neuromuscular re-education, Balance training, Self Care, Joint mobilization, Cryotherapy, Moist heat, Manual therapy, and Re-evaluation  PLAN FOR NEXT SESSION: Progress HEP  Ozell JAYSON Sero, PT, DPT # [phone] 08/22/2023, 10:32 AM

## 2023-08-23 ENCOUNTER — Encounter: Payer: Self-pay | Admitting: Internal Medicine

## 2023-08-23 ENCOUNTER — Ambulatory Visit: Admitting: Internal Medicine

## 2023-08-23 VITALS — BP 120/76 | HR 56 | Temp 97.5°F | Resp 20 | Ht 62.0 in | Wt 168.0 lb

## 2023-08-23 DIAGNOSIS — E6609 Other obesity due to excess calories: Secondary | ICD-10-CM

## 2023-08-23 DIAGNOSIS — R42 Dizziness and giddiness: Secondary | ICD-10-CM | POA: Diagnosis not present

## 2023-08-23 DIAGNOSIS — E538 Deficiency of other specified B group vitamins: Secondary | ICD-10-CM | POA: Diagnosis not present

## 2023-08-23 DIAGNOSIS — Z6830 Body mass index (BMI) 30.0-30.9, adult: Secondary | ICD-10-CM

## 2023-08-23 DIAGNOSIS — I10 Essential (primary) hypertension: Secondary | ICD-10-CM | POA: Diagnosis not present

## 2023-08-23 DIAGNOSIS — R55 Syncope and collapse: Secondary | ICD-10-CM

## 2023-08-23 DIAGNOSIS — R5383 Other fatigue: Secondary | ICD-10-CM | POA: Diagnosis not present

## 2023-08-23 DIAGNOSIS — R7301 Impaired fasting glucose: Secondary | ICD-10-CM | POA: Diagnosis not present

## 2023-08-23 DIAGNOSIS — E66811 Obesity, class 1: Secondary | ICD-10-CM

## 2023-08-23 DIAGNOSIS — E7800 Pure hypercholesterolemia, unspecified: Secondary | ICD-10-CM | POA: Diagnosis not present

## 2023-08-23 DIAGNOSIS — E034 Atrophy of thyroid (acquired): Secondary | ICD-10-CM | POA: Diagnosis not present

## 2023-08-23 DIAGNOSIS — D649 Anemia, unspecified: Secondary | ICD-10-CM

## 2023-08-23 LAB — COMPREHENSIVE METABOLIC PANEL WITH GFR
ALT: 10 U/L (ref 0–35)
AST: 16 U/L (ref 0–37)
Albumin: 4 g/dL (ref 3.5–5.2)
Alkaline Phosphatase: 57 U/L (ref 39–117)
BUN: 22 mg/dL (ref 6–23)
CO2: 29 meq/L (ref 19–32)
Calcium: 9.2 mg/dL (ref 8.4–10.5)
Chloride: 101 meq/L (ref 96–112)
Creatinine, Ser: 0.74 mg/dL (ref 0.40–1.20)
GFR: 75.81 mL/min
Glucose, Bld: 92 mg/dL (ref 70–99)
Potassium: 4.3 meq/L (ref 3.5–5.1)
Sodium: 138 meq/L (ref 135–145)
Total Bilirubin: 0.7 mg/dL (ref 0.2–1.2)
Total Protein: 6.3 g/dL (ref 6.0–8.3)

## 2023-08-23 LAB — CBC WITH DIFFERENTIAL/PLATELET
Basophils Absolute: 0 K/uL (ref 0.0–0.1)
Basophils Relative: 0.3 % (ref 0.0–3.0)
Eosinophils Absolute: 0.1 K/uL (ref 0.0–0.7)
Eosinophils Relative: 0.7 % (ref 0.0–5.0)
HCT: 46.1 % — ABNORMAL HIGH (ref 36.0–46.0)
Hemoglobin: 15.3 g/dL — ABNORMAL HIGH (ref 12.0–15.0)
Lymphocytes Relative: 19.2 % (ref 12.0–46.0)
Lymphs Abs: 1.7 K/uL (ref 0.7–4.0)
MCHC: 33.3 g/dL (ref 30.0–36.0)
MCV: 96.8 fl (ref 78.0–100.0)
Monocytes Absolute: 0.6 K/uL (ref 0.1–1.0)
Monocytes Relative: 6.4 % (ref 3.0–12.0)
Neutro Abs: 6.4 K/uL (ref 1.4–7.7)
Neutrophils Relative %: 73.4 % (ref 43.0–77.0)
Platelets: 258 K/uL (ref 150.0–400.0)
RBC: 4.76 Mil/uL (ref 3.87–5.11)
RDW: 12.4 % (ref 11.5–15.5)
WBC: 8.7 K/uL (ref 4.0–10.5)

## 2023-08-23 LAB — LIPID PANEL
Cholesterol: 209 mg/dL — ABNORMAL HIGH (ref 0–200)
HDL: 76.8 mg/dL
LDL Cholesterol: 111 mg/dL — ABNORMAL HIGH (ref 0–99)
NonHDL: 132.38
Total CHOL/HDL Ratio: 3
Triglycerides: 106 mg/dL (ref 0.0–149.0)
VLDL: 21.2 mg/dL (ref 0.0–40.0)

## 2023-08-23 LAB — MICROALBUMIN / CREATININE URINE RATIO
Creatinine,U: 134.7 mg/dL
Microalb Creat Ratio: 13.4 mg/g (ref 0.0–30.0)
Microalb, Ur: 1.8 mg/dL (ref 0.0–1.9)

## 2023-08-23 LAB — LDL CHOLESTEROL, DIRECT: Direct LDL: 113 mg/dL

## 2023-08-23 LAB — HEMOGLOBIN A1C: Hgb A1c MFr Bld: 5.3 % (ref 4.6–6.5)

## 2023-08-23 LAB — B12 AND FOLATE PANEL
Folate: >23.4 ng/mL (ref >5.9)
Vitamin B-12: >1500 pg/mL — ABNORMAL HIGH (ref 211–911)

## 2023-08-23 LAB — TSH: TSH: 5.1 u[IU]/mL (ref 0.35–5.50)

## 2023-08-23 MED ORDER — METOPROLOL SUCCINATE ER 25 MG PO TB24: 25.0000 mg | ORAL_TABLET | Freq: Every day | ORAL | 1 refills | Status: AC

## 2023-08-23 MED ORDER — ESTRADIOL 2 MG PO TABS: ORAL_TABLET | ORAL | 1 refills | Status: AC

## 2023-08-23 NOTE — Progress Notes (Signed)
 Subjective:  Patient ID: Meagan Cox, female    DOB: Jan 09, 1941  Age: 83 y.o. MRN: [id number]  CC: The primary encounter diagnosis was Essential hypertension. Diagnoses of Hypothyroidism due to acquired atrophy of thyroid , Pure hypercholesterolemia, Impaired fasting glucose, B12 deficiency, Other fatigue, Postural dizziness with presyncope, and Class 1 obesity due to excess calories with serious comorbidity and body mass index (BMI) of 30.0 to 30.9 in adult were also pertinent to this visit.   HPI Hareem Surowiec presents for  Chief Complaint  Patient presents with   Medical Management of Chronic Issues    6 month follow up   1)  fatigue.   Wakes up at 3 am  gets up and voids,  goes back to sleep,  wakes up rested but starts feeling tired by early afternoon , not sleepy during the day.   Has untreated OSA  2) Deidra has been having episodes of presyncope occurring while standing,  which resolve with upine position. She Has not fainted .and denies chest pain /palpitations   Has not been walking her usual routine for several months due to  orthopedic issues  3) diagnosed with pyriformis syndrome, hip bursitis,  getting PT and had an injection by Leora for the bursitis.  s/p left hip replacement one year ago    Concerned about  her daughter kelly roten's polypharmacy .  Kelly's husband died 2 weeks ago after an  anaphylaxis to bee sting    Outpatient Medications Prior to Visit  Medication Sig Dispense Refill   levothyroxine  (SYNTHROID ) 100 MCG tablet Take 1 tablet (100 mcg total) by mouth daily before breakfast. 90 tablet 3   estradiol  (ESTRACE ) 2 MG tablet TAKE 1 TABLET(2 MG) BY MOUTH DAILY 90 tablet 0   metoprolol  succinate (TOPROL -XL) 25 MG 24 hr tablet Take 1 tablet (25 mg total) by mouth daily. 30 tablet 0   No facility-administered medications prior to visit.    Review of Systems;  Patient denies headache, fevers, malaise, unintentional weight loss, skin rash, eye  pain, sinus congestion and sinus pain, sore throat, dysphagia,  hemoptysis , cough, dyspnea, wheezing, chest pain, palpitations, orthopnea, edema, abdominal pain, nausea, melena, diarrhea, constipation, flank pain, dysuria, hematuria, urinary  Frequency, nocturia, numbness, tingling, seizures,  Focal weakness, Loss of consciousness,  Tremor, insomnia, depression, anxiety, and suicidal ideation.      Objective:  BP 120/76   Pulse (!) 56   Temp (!) 97.5 F (36.4 C)   Resp 20   Ht 5' 2 (1.575 m)   Wt 168 lb (76.2 kg)   SpO2 96%   BMI 30.73 kg/m   BP Readings from Last 3 Encounters:  08/23/23 120/76  12/19/22 128/74  12/28/21 130/70    Wt Readings from Last 3 Encounters:  08/23/23 168 lb (76.2 kg)  12/19/22 169 lb (76.7 kg)  12/28/21 175 lb 3.2 oz (79.5 kg)    Physical Exam Vitals reviewed.  Constitutional:      General: She is not in acute distress.    Appearance: Normal appearance. She is normal weight. She is not ill-appearing, toxic-appearing or diaphoretic.  HENT:     Head: Normocephalic.  Eyes:     General: No scleral icterus.       Right eye: No discharge.        Left eye: No discharge.     Conjunctiva/sclera: Conjunctivae normal.  Cardiovascular:     Rate and Rhythm: Regular rhythm. Bradycardia present.     Heart sounds: Normal  heart sounds.  Pulmonary:     Effort: Pulmonary effort is normal. No respiratory distress.     Breath sounds: Normal breath sounds.  Musculoskeletal:        General: Normal range of motion.  Skin:    General: Skin is warm and dry.  Neurological:     General: No focal deficit present.     Mental Status: She is alert and oriented to person, place, and time. Mental status is at baseline.  Psychiatric:        Mood and Affect: Mood normal.        Behavior: Behavior normal.        Thought Content: Thought content normal.        Judgment: Judgment normal.     Lab Results  Component Value Date   HGBA1C 5.3 08/23/2023   HGBA1C 5.0  05/04/2020   HGBA1C 5.1 04/02/2019    Lab Results  Component Value Date   CREATININE 0.74 08/23/2023   CREATININE 0.68 12/19/2022   CREATININE 0.80 09/27/2021    Lab Results  Component Value Date   WBC 8.7 08/23/2023   HGB 15.3 (H) 08/23/2023   HCT 46.1 (H) 08/23/2023   PLT 258.0 08/23/2023   GLUCOSE 92 08/23/2023   CHOL 209 (H) 08/23/2023   TRIG 106.0 08/23/2023   HDL 76.80 08/23/2023   LDLDIRECT 113.0 08/23/2023   LDLCALC 111 (H) 08/23/2023   ALT 10 08/23/2023   AST 16 08/23/2023   NA 138 08/23/2023   K 4.3 08/23/2023   CL 101 08/23/2023   CREATININE 0.74 08/23/2023   BUN 22 08/23/2023   CO2 29 08/23/2023   TSH 5.10 08/23/2023   HGBA1C 5.3 08/23/2023   MICROALBUR 1.8 08/23/2023    MM 3D SCREEN BREAST BILATERAL Result Date: 10/26/2021 CLINICAL DATA:  Screening. EXAM: DIGITAL SCREENING BILATERAL MAMMOGRAM WITH TOMOSYNTHESIS AND CAD TECHNIQUE: Bilateral screening digital craniocaudal and mediolateral oblique mammograms were obtained. Bilateral screening digital breast tomosynthesis was performed. The images were evaluated with computer-aided detection. COMPARISON:  Previous exam(s). ACR Breast Density Category b: There are scattered areas of fibroglandular density. FINDINGS: There are no findings suspicious for malignancy. IMPRESSION: No mammographic evidence of malignancy. A result letter of this screening mammogram will be mailed directly to the patient. RECOMMENDATION: Screening mammogram in one year. (Code:SM-B-01Y) BI-RADS CATEGORY  1: Negative. Electronically Signed   By: Rosaline Collet M.D.   On: 10/26/2021 07:50    Assessment & Plan:  .Essential hypertension Assessment & Plan: Stopping metoprolol  due to bradycardia and presyncope  Orders: -     Comprehensive metabolic panel with GFR -     Microalbumin / creatinine urine ratio  Hypothyroidism due to acquired atrophy of thyroid  Assessment & Plan: Thyroid  function is vorderline underactive on current  100 mcg  levothyroxine  dose.  Will increase dose to 112  mcg given c/o fatigeue and bradycardia     Lab Results  Component Value Date   TSH 5.10 08/23/2023     Orders: -     TSH -     Levothyroxine  Sodium; Take 1 tablet (112 mcg total) by mouth daily before breakfast.  Dispense: 90 tablet; Refill: 0 -     TSH; Future  Pure hypercholesterolemia -     Lipid panel -     LDL cholesterol, direct  Impaired fasting glucose -     Comprehensive metabolic panel with GFR -     Hemoglobin A1c  B12 deficiency -     B12 and Folate  Panel  Other fatigue -     Iron, TIBC and Ferritin Panel  Postural dizziness with presyncope Assessment & Plan: She is not orthostatic.SABRA  symptoms occur after periods of standing and are relieved with sitting down.  I have ordered and reviewed a 12 lead EKG and find that there are no acute changes and patient is in sinus bradycardia.  Will suspend metoprolol  and  order [facility] to rule out arrhythmia.    Lab Results  Component Value Date   WBC 8.7 08/23/2023   HGB 15.3 (H) 08/23/2023   HCT 46.1 (H) 08/23/2023   MCV 96.8 08/23/2023   PLT 258.0 08/23/2023   Lab Results  Component Value Date   TSH 5.10 08/23/2023   Lab Results  Component Value Date   NA 138 08/23/2023   K 4.3 08/23/2023   CL 101 08/23/2023   CO2 29 08/23/2023     Orders: -     EKG 12-Lead -     CBC with Differential/Platelet -     LONG TERM MONITOR (3-14 DAYS); Future  Class 1 obesity due to excess calories with serious comorbidity and body mass index (BMI) of 30.0 to 30.9 in adult Assessment & Plan: she has achieved an intentional  wt loss of 35 lbs >  of  since Feb 2019.  following a low GI diet , BMI reduced from 37 to 30.9. she plasn to resume exercise once her hip pain has resolved    Other orders -     Estradiol ; TAKE 1 TABLET(2 MG) BY MOUTH DAILY  Dispense: 90 tablet; Refill: 1 -     Metoprolol  Succinate ER; Take 1 tablet (25 mg total) by mouth daily.  Dispense: 90 tablet; Refill:  1     In addition to time spend interpreting her EKG, I spent 34 minutes on the day of this face to face encounter reviewing patient's  most recent visit with physical therapy,  orthopedics,  prior relevant surgical and non surgical procedures, recent  labs and imaging studies, counseling on weight management,  reviewing the assessment and plan with patient, and post visit ordering and reviewing of  diagnostics and therapeutics with patient  .   Follow-up: No follow-ups on file.   Verneita LITTIE Kettering, MD

## 2023-08-23 NOTE — Patient Instructions (Addendum)
 Stop the metoprolol ,  it may be causing your fatigue because it is slowing your heart rate down   If your labs are normal today,  and your symptoms persist despite stopping the metoprolol ,  I recommend a 14 day heart monitor to evaluate your heart rhythms and will order it to be delivered to your house.   If YOur blood pressure at home increases to 140/90,  let me know so I can prescribe an alternative

## 2023-08-23 NOTE — Assessment & Plan Note (Signed)
 Stopping metoprolol  due to bradycardia and presyncope

## 2023-08-24 ENCOUNTER — Ambulatory Visit: Payer: Self-pay | Admitting: Internal Medicine

## 2023-08-24 DIAGNOSIS — R42 Dizziness and giddiness: Secondary | ICD-10-CM | POA: Insufficient documentation

## 2023-08-24 LAB — IRON,TIBC AND FERRITIN PANEL
%SAT: 44 % (ref 16–45)
Ferritin: 125 ng/mL (ref 16–288)
Iron: 134 ug/dL (ref 45–160)
TIBC: 302 ug/dL (ref 250–450)

## 2023-08-24 MED ORDER — LEVOTHYROXINE SODIUM 112 MCG PO TABS: 112.0000 ug | ORAL_TABLET | Freq: Every day | ORAL | 0 refills | Status: DC

## 2023-08-24 NOTE — Assessment & Plan Note (Signed)
 Thyroid  function is vorderline underactive on current  100 mcg levothyroxine  dose.  Will increase dose to 112  mcg given c/o fatigeue and bradycardia     Lab Results  Component Value Date   TSH 5.10 08/23/2023

## 2023-08-24 NOTE — Assessment & Plan Note (Addendum)
 She is not orthostatic.SABRA  symptoms occur after periods of standing and are relieved with sitting down.  I have ordered and reviewed a 12 lead EKG and find that there are no acute changes and patient is in sinus bradycardia.  Will suspend metoprolol  and  order ZIO to rule out arrhythmia.    Lab Results  Component Value Date   WBC 8.7 08/23/2023   HGB 15.3 (H) 08/23/2023   HCT 46.1 (H) 08/23/2023   MCV 96.8 08/23/2023   PLT 258.0 08/23/2023   Lab Results  Component Value Date   TSH 5.10 08/23/2023   Lab Results  Component Value Date   NA 138 08/23/2023   K 4.3 08/23/2023   CL 101 08/23/2023   CO2 29 08/23/2023

## 2023-08-24 NOTE — Assessment & Plan Note (Addendum)
 she has achieved an intentional  wt loss of 35 lbs >  of  since Feb 2019.  following a low GI diet , BMI reduced from 37 to 30.9. she plasn to resume exercise once her hip pain has resolved

## 2023-08-27 ENCOUNTER — Ambulatory Visit: Admitting: Physical Therapy

## 2023-09-03 ENCOUNTER — Encounter: Admitting: Physical Therapy

## 2023-09-09 ENCOUNTER — Ambulatory Visit: Attending: Internal Medicine

## 2023-09-09 DIAGNOSIS — R42 Dizziness and giddiness: Secondary | ICD-10-CM

## 2023-09-09 NOTE — Telephone Encounter (Signed)
 Checked with [facility] and they did receive the order this time. It will be shipped to pt soon.

## 2023-09-09 NOTE — Telephone Encounter (Signed)
 For some reason the order did not go through electronically to [facility]. I have reordered and will check on the order status later this afternoon.

## 2023-09-10 ENCOUNTER — Encounter: Admitting: Physical Therapy

## 2023-09-17 ENCOUNTER — Encounter: Admitting: Physical Therapy

## 2023-09-24 ENCOUNTER — Ambulatory Visit: Attending: Orthopedic Surgery | Admitting: Physical Therapy

## 2023-09-24 ENCOUNTER — Encounter: Payer: Self-pay | Admitting: Physical Therapy

## 2023-09-24 DIAGNOSIS — R269 Unspecified abnormalities of gait and mobility: Secondary | ICD-10-CM | POA: Insufficient documentation

## 2023-09-24 DIAGNOSIS — M6281 Muscle weakness (generalized): Secondary | ICD-10-CM | POA: Diagnosis not present

## 2023-09-24 DIAGNOSIS — R2689 Other abnormalities of gait and mobility: Secondary | ICD-10-CM | POA: Diagnosis not present

## 2023-09-24 DIAGNOSIS — M25552 Pain in left hip: Secondary | ICD-10-CM | POA: Diagnosis not present

## 2023-09-24 DIAGNOSIS — M2560 Stiffness of unspecified joint, not elsewhere classified: Secondary | ICD-10-CM | POA: Insufficient documentation

## 2023-09-24 DIAGNOSIS — M25652 Stiffness of left hip, not elsewhere classified: Secondary | ICD-10-CM | POA: Diagnosis not present

## 2023-09-24 NOTE — Therapy (Signed)
 OUTPATIENT PHYSICAL THERAPY LOWER EXTREMITY TREATMENT  Patient Name: Meagan Cox MRN: [id number] DOB:[birth year], 83 y.o., female Today's Date: 09/24/2023  END OF SESSION:  PT End of Session - 09/24/23 0956     Visit Number 14    Number of Visits 21    Date for PT Re-Evaluation 12/12/23    PT Start Time 0946    PT Stop Time 1030    PT Time Calculation (min) 44 min    Activity Tolerance Patient tolerated treatment well    Behavior During Therapy [facility] for tasks assessed/performed          Past Medical History:  Diagnosis Date   Hyperlipidemia    Hypertension    Hypothyroidism    Past Surgical History:  Procedure Laterality Date   ABDOMINAL HYSTERECTOMY     CARDIAC CATHETERIZATION     30 years ago, [place]   CHOLECYSTECTOMY N/A 08/21/2015   Procedure: LAPAROSCOPIC CHOLECYSTECTOMY;  Surgeon: Laneta JULIANNA Luna, MD;  Location: [facility] ORS;  Service: General;  Laterality: N/A;   EYE SURGERY     eye lid lift   KNEE ARTHROSCOPY Right 08/28/2018   Procedure: ARTHROSCOPY KNEE;  Surgeon: Leora Lynwood SAUNDERS, MD;  Location: [facility];  Service: Orthopedics;  Laterality: Right;   PARTIAL HYSTERECTOMY     REPLACEMENT TOTAL KNEE Right 2021   Bowers   right hip replacement Right 2017   Bowers   TONSILLECTOMY     TOTAL HIP ARTHROPLASTY Left    Pt reported left hip replacement done in June   Patient Active Problem List   Diagnosis Date Noted   Postural dizziness with presyncope 08/24/2023   Hypervitaminosis D 12/20/2022   Cough in adult patient 12/20/2022   Encounter for preventive care 12/30/2021   Asymptomatic postmenopausal estrogen deficiency 12/28/2021   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   Aortic atherosclerosis (HCC) 11/05/2020   Postsurgical menopause 06/09/2020   Breast cancer screening 05/04/2020   Loss of perception for taste 10/19/2017   Essential hypertension 03/05/2017   S/P laparoscopic cholecystectomy 03/05/2017   Ptosis, both eyelids 01/04/2017   Complete  tear, knee, anterior cruciate ligament 08/28/2016   History of total hip arthroplasty 08/28/2016   Tear of meniscus of knee 08/28/2016   Medicare annual wellness visit, subsequent 07/24/2015   Cerebrovascular small vessel disease 04/24/2015   Benign paroxysmal positional vertigo 04/24/2015   Edema 05/14/2012   Polyarthritis of ankle 04/08/2011   Hypothyroidism 12/18/2010   Low back pain radiating to both legs 12/18/2010   Chronic right hip pain 12/18/2010   Obesity 05/09/2010   CAD (coronary artery disease) 05/09/2010   Hyperlipidemia 05/09/2010   PCP: Marylynn Verneita CROME, MD  REFERRING PROVIDER: Leora Lynwood JAYSON Mickey., MD  REFERRING DIAG: [phone] (ICD-10-CM) - Presence of left artificial hip joint   THERAPY DIAG:  Pain in left hip  Stiffness of left hip, not elsewhere classified  Gait difficulty  Muscle weakness (generalized)  Decreased range of motion  Other abnormalities of gait and mobility  Rationale for Evaluation and Treatment: Rehabilitation  ONSET DATE: 09/07/2022  SUBJECTIVE:   SUBJECTIVE STATEMENT: Pt reports to PT with c/c of L lateral hip/ piriformis pain with increase walking/ climbing stairs/ household tasks/ sleeping position.  Pt had L hip replaced on 07/12/2022, which subsequently got infected on 08/01/2022. Pt. Worked hard with PT in past and returns today with increase c/o L hip/piriformis pain symptoms.  Pt. Prescribed Celebrex  but has not started medication. She denies any numbness/tingling or clicking/popping.   PERTINENT  HISTORY: Hx of bilateral THA (2017), R TKA (2021), CAD, HTN  PAIN:  Are you having pain? Yes: NPRS scale: 3/10 currently, >7/10 at worst Pain location: L lateral hip Pain description: sore/ tight Aggravating factors: Walking Relieving factors: Rest, wine  PRECAUTIONS: None  RED FLAGS: None   WEIGHT BEARING RESTRICTIONS: No  FALLS:  Has patient fallen in last 6 months? No falls but several near falls where pt describes losing  her balance with forward leaning  LIVING ENVIRONMENT: Lives with: lives with their daughter Lives in: House/apartment Stairs: Yes: External: 3 steps; can reach both Has following equipment at home: Retail banker - 2 wheeled  OCCUPATION: Retired  PLOF: Independent with household mobility with device   PATIENT GOALS: Get back to walking without pain, get ready for 1 month [place] cruise in December   NEXT MD VISIT: 3rd week of June  OBJECTIVE:   DIAGNOSTIC FINDINGS: N/A  PATIENT SURVEYS:  LEFS: 29 out of 86  COGNITION: Overall cognitive status: Within functional limits for tasks assessed     SENSATION: WFL  MUSCLE LENGTH: Hamstrings: Right 150 deg; Left 150 deg  POSTURE: No Significant postural limitations  LEG LENGTH: 34 inches bilaterally for true leg length (ASIS to medial malleolus)  LOWER EXTREMITY ROM:  Active ROM Right eval Left eval  Hip flexion 115 110  Hip extension 12 8  Hip abduction 32 21  Hip adduction    Hip internal rotation    Hip external rotation    Knee flexion [facility] WFL  Knee extension [facility] WFL  Ankle dorsiflexion WFL WFL   (Blank rows = not tested)  LOWER EXTREMITY MMT:  MMT Right eval Left eval  Hip flexion 3+ 3   Hip extension 4 3+  Hip abduction 4 3  Hip adduction    Hip internal rotation 3+ 3+  Hip external rotation 4- 3+  Knee flexion 5 5  Knee extension 5 5  Ankle dorsiflexion    Ankle plantarflexion    Ankle inversion    Ankle eversion     (Blank rows = not tested)  LOWER EXTREMITY SPECIAL TESTS:  Hip special tests: Belvie (FABER) test: positive on L and (FADIR) test: negative bilaterally  FUNCTIONAL TESTS:  5 times sit to stand: 22.58 seconds (no UE assist).  L hip IR/ foot IV with STS and while sitting in recliner (reported per pt.).    GAIT: Distance walked: in clinic Assistive device utilized: Quad cane small base Level of assistance: Complete Independence Comments: Antalgic gait,  (+) trendelenburg noted on L side.  Increase L hip/ piriformis pain with recip. Stair climbing, esp. Descending.  Pt. Prefers lateral step pattern while descending.    LEFS: 58 out of 80.  Marked improvement  Active ROM Right eval Left eval  Hip flexion 119 123  Hip extension  (standing) 26 27  Hip abduction 28 26 (pain)  Hip adduction    Hip internal rotation    Hip external rotation    Knee flexion [facility]  Knee extension [facility]  Ankle dorsiflexion [facility] WFL   5xSTS: 23 sec.  (No UE assist) Observed stair mechanics: Pt utilized B handrails and a step through patten when ascending and descending. Pt reported L anterior hip pain when descending. No pain reported on ascending  LEFS: 50 out of 80 (marked improvement from initial evaluation but slight regression from last reassessment).    TODAY'S TREATMENT:  DATE: 09/24/2023  Subjective:  Pt. Had a cortisone injection in L hip since last PT tx. Session.  Pt. Entered PT with marked improvement in gait pattern and no c/o pain at rest.  Pt. States she does not like exercising but understands that she needs to be more active and strengthen hip/LE.     There.act.:  Gait reassessment in clinic with marked improvement in L LE stance phase of gait without c/o pain.    LOWER EXTREMITY MMT:  MMT Right eval Left eval  Hip flexion 3+ 3+  Hip extension    Hip abduction 4+ 4+ (minimal discomfort)  Hip adduction    Hip internal rotation 4 4  Hip external rotation 3+ 3+  Knee flexion 5 5  Knee extension 5 5  Ankle dorsiflexion    Ankle plantarflexion    Ankle inversion    Ankle eversion     (Blank rows = not tested) L Hamstring length: Proximal: 64 deg, Distal: 52 deg  Manual tx.:  Supine R/L LE stretches (focus on on L hip/ piriformis/ glut stretches).    STM to L anterior hip in supine (remains tight/ thick  band of muscle as compared to R).     PATIENT EDUCATION:  Education details: Pt educated on HEP form and frequency, role of PT going forward Person educated: Patient Education method: Explanation, Demonstration, and Handouts Education comprehension: verbalized understanding and returned demonstration  HOME EXERCISE PROGRAM: Access Code: [email or web address] URL: [email or web address] Date: 06/05/2023 Prepared by: Ozell Sero  Exercises - Supine Hamstring Stretch  - 2 x daily - 7 x weekly - 1 sets - 3 reps - Supine Figure 4 Piriformis Stretch  - 2 x daily - 7 x weekly - 1 sets - 3 reps - 20 seconds hold - Supine Piriformis Stretch with Leg Straight  - 2 x daily - 7 x weekly - 1 sets - 3 reps - 20 seconds hold - Supine Bridge  - 2 x daily - 4-5 x weekly - 2 sets - 10 reps - Seated Piriformis Stretch  - 2 x daily - 7 x weekly - 1 sets - 3 reps - 20 seconds hold - Sit to Stand  - 2 x daily - 4-5 x weekly - 2 sets - 10 reps  ASSESSMENT:  CLINICAL IMPRESSION: Pt. Reports ongoing L anterior hip muscle tightness but less overall hip pain since injection.  Generalized L hip/LE muscle weakness remains and pt. Understands the importance of completing HEP/ daily walking.  STM relieved L anterior hip tension but trigger point still palpable at end of session.  PT discussed decreasing tx. Frequency with focus on HEP.  Pt. Instructed to contact PT if any regression in symptoms or questions.  Pt would be unable to climb a ladder at this time due to her muscle weakness.  See updated goals.  Pt. Will continue to benefit from skilled PT services to increase hip strength to improve pain-free mobility.       OBJECTIVE IMPAIRMENTS: Abnormal gait, difficulty walking, decreased ROM, decreased strength, impaired flexibility, and pain.   ACTIVITY LIMITATIONS: bending, standing, squatting, and locomotion level  PARTICIPATION LIMITATIONS: cleaning, laundry, community activity, and yard work  PERSONAL  FACTORS: Age, Time since onset of injury/illness/exacerbation, and 3+ comorbidities: Hx of bilateral THA, hx of R TKA, CAD, HTN are also affecting patient's functional outcome.   REHAB POTENTIAL: Good  CLINICAL DECISION MAKING: Stable/uncomplicated  EVALUATION COMPLEXITY: Low   GOALS:  LONG TERM GOALS: Target date: 12/12/2023  Pt  will increase LEFS to >60 in order to demonstrate subjective improvement in pain and ADL performance. Baseline: Initial 29.  6/9: 58.  7/24: 50 Goal status: Partially met  2.  Pt will increase L hip AROM by at least 10 degrees in all planes to improve function with squatting/bending activities. Baseline: See above for initial ROM measurements   6/26: see above Goal status: Partially met  3.  Pt will improve bilateral hip MMT scores to at least 4/5 in order to show improved hip musculature strength needed for improved walking/activity tolerance. Baseline: See above for initial MMT scores.  6/26:  pain limted MMT Goal status: Partially met  4.  Pt will decrease 5TSTS by at least 3 seconds in order to demonstrate clinically significant improvement in LE strength. Baseline: eval: 22.58 sec,  6/26: 23 sec Goal status: Not met  5.  Pt will report decrease pain by at least 3 points on NPS scale with walking in order to show improved walking tolerance. Baseline: >6/10 NPS scale with walking on initial evaluation, 6/26: 3/10 NPS scale when walking on even ground, pt reports is higher when walking on uneven ground Goal status: Partially met  6.  Pt will be able to climb ladder with no hip limitations in order to be able to go on Cruise ship at end of year.   Baseline: unable Goal status: Not met  PLAN:  PT FREQUENCY: Biweekly  PT DURATION: 16 weeks  PLANNED INTERVENTIONS: Therapeutic exercises, Therapeutic activity, Neuromuscular re-education, Balance training, Self Care, Joint mobilization, Cryotherapy, Moist heat, Manual therapy, and Re-evaluation  PLAN  FOR NEXT SESSION: Progress HEP  Ozell JAYSON Sero, PT, DPT # [id number] 09/24/2023, 5:34 PM

## 2023-10-01 ENCOUNTER — Ambulatory Visit: Admitting: Physical Therapy

## 2023-10-05 DIAGNOSIS — R55 Syncope and collapse: Secondary | ICD-10-CM | POA: Diagnosis not present

## 2023-10-05 DIAGNOSIS — R42 Dizziness and giddiness: Secondary | ICD-10-CM

## 2023-10-07 ENCOUNTER — Other Ambulatory Visit (INDEPENDENT_AMBULATORY_CARE_PROVIDER_SITE_OTHER)

## 2023-10-07 DIAGNOSIS — E034 Atrophy of thyroid (acquired): Secondary | ICD-10-CM | POA: Diagnosis not present

## 2023-10-07 LAB — TSH: TSH: 2.17 u[IU]/mL (ref 0.35–5.50)

## 2023-10-08 ENCOUNTER — Ambulatory Visit: Attending: Orthopedic Surgery | Admitting: Physical Therapy

## 2023-10-08 ENCOUNTER — Encounter: Payer: Self-pay | Admitting: Physical Therapy

## 2023-10-08 DIAGNOSIS — M2560 Stiffness of unspecified joint, not elsewhere classified: Secondary | ICD-10-CM | POA: Diagnosis not present

## 2023-10-08 DIAGNOSIS — R2689 Other abnormalities of gait and mobility: Secondary | ICD-10-CM | POA: Diagnosis not present

## 2023-10-08 DIAGNOSIS — M6281 Muscle weakness (generalized): Secondary | ICD-10-CM | POA: Diagnosis not present

## 2023-10-08 DIAGNOSIS — M25552 Pain in left hip: Secondary | ICD-10-CM | POA: Insufficient documentation

## 2023-10-08 DIAGNOSIS — R269 Unspecified abnormalities of gait and mobility: Secondary | ICD-10-CM | POA: Diagnosis not present

## 2023-10-08 DIAGNOSIS — M25652 Stiffness of left hip, not elsewhere classified: Secondary | ICD-10-CM | POA: Insufficient documentation

## 2023-10-08 NOTE — Therapy (Signed)
 OUTPATIENT PHYSICAL THERAPY LOWER EXTREMITY TREATMENT  Patient Name: Meagan Cox MRN: [id number] DOB:06-Sep-1940, 83 y.o., female Today's Date: 10/09/2023  END OF SESSION:  PT End of Session - 10/08/23 0943     Visit Number 15    Number of Visits 21    Date for PT Re-Evaluation 12/12/23    PT Start Time 0944    PT Stop Time 1032    PT Time Calculation (min) 48 min    Activity Tolerance Patient tolerated treatment well    Behavior During Therapy [facility] for tasks assessed/performed         Past Medical History:  Diagnosis Date   Hyperlipidemia    Hypertension    Hypothyroidism    Past Surgical History:  Procedure Laterality Date   ABDOMINAL HYSTERECTOMY     CARDIAC CATHETERIZATION     30 years ago, [place]   CHOLECYSTECTOMY N/A 08/21/2015   Procedure: LAPAROSCOPIC CHOLECYSTECTOMY;  Surgeon: Laneta JULIANNA Luna, MD;  Location: [facility];  Service: General;  Laterality: N/A;   EYE SURGERY     eye lid lift   KNEE ARTHROSCOPY Right 08/28/2018   Procedure: ARTHROSCOPY KNEE;  Surgeon: Leora Lynwood SAUNDERS, MD;  Location: [facility];  Service: Orthopedics;  Laterality: Right;   PARTIAL HYSTERECTOMY     REPLACEMENT TOTAL KNEE Right 2021   Bowers   right hip replacement Right 2017   Bowers   TONSILLECTOMY     TOTAL HIP ARTHROPLASTY Left    Pt reported left hip replacement done in June   Patient Active Problem List   Diagnosis Date Noted   Postural dizziness with presyncope 08/24/2023   Hypervitaminosis D 12/20/2022   Cough in adult patient 12/20/2022   Encounter for preventive care 12/30/2021   Asymptomatic postmenopausal estrogen deficiency 12/28/2021   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   Aortic atherosclerosis (HCC) 11/05/2020   Postsurgical menopause 06/09/2020   Breast cancer screening 05/04/2020   Loss of perception for taste 10/19/2017   Essential hypertension 03/05/2017   S/P laparoscopic cholecystectomy 03/05/2017   Ptosis, both eyelids 01/04/2017   Complete  tear, knee, anterior cruciate ligament 08/28/2016   History of total hip arthroplasty 08/28/2016   Tear of meniscus of knee 08/28/2016   Medicare annual wellness visit, subsequent 07/24/2015   Cerebrovascular small vessel disease 04/24/2015   Benign paroxysmal positional vertigo 04/24/2015   Edema 05/14/2012   Polyarthritis of ankle 04/08/2011   Hypothyroidism 12/18/2010   Low back pain radiating to both legs 12/18/2010   Chronic right hip pain 12/18/2010   Obesity 05/09/2010   CAD (coronary artery disease) 05/09/2010   Hyperlipidemia 05/09/2010   PCP: Marylynn Verneita CROME, MD  REFERRING PROVIDER: Leora Lynwood JAYSON Mickey., MD  REFERRING DIAG: [phone] (ICD-10-CM) - Presence of left artificial hip joint   THERAPY DIAG:  Pain in left hip  Stiffness of left hip, not elsewhere classified  Gait difficulty  Muscle weakness (generalized)  Decreased range of motion  Other abnormalities of gait and mobility  Rationale for Evaluation and Treatment: Rehabilitation  ONSET DATE: 09/07/2022  SUBJECTIVE:   SUBJECTIVE STATEMENT: Pt reports to PT with c/c of L lateral hip/ piriformis pain with increase walking/ climbing stairs/ household tasks/ sleeping position.  Pt had L hip replaced on 07/12/2022, which subsequently got infected on 08/01/2022. Pt. Worked hard with PT in past and returns today with increase c/o L hip/piriformis pain symptoms.  Pt. Prescribed Celebrex  but has not started medication. She denies any numbness/tingling or clicking/popping.   PERTINENT HISTORY:  Hx of bilateral THA (2017), R TKA (2021), CAD, HTN  PAIN:  Are you having pain? Yes: NPRS scale: 3/10 currently, >7/10 at worst Pain location: L lateral hip Pain description: sore/ tight Aggravating factors: Walking Relieving factors: Rest, wine  PRECAUTIONS: None  RED FLAGS: None   WEIGHT BEARING RESTRICTIONS: No  FALLS:  Has patient fallen in last 6 months? No falls but several near falls where pt describes losing  her balance with forward leaning  LIVING ENVIRONMENT: Lives with: lives with their daughter Lives in: House/apartment Stairs: Yes: External: 3 steps; can reach both Has following equipment at home: Retail banker - 2 wheeled  OCCUPATION: Retired  PLOF: Independent with household mobility with device   PATIENT GOALS: Get back to walking without pain, get ready for 1 month [place] cruise in December   NEXT MD VISIT: 3rd week of June  OBJECTIVE:   DIAGNOSTIC FINDINGS: N/A  PATIENT SURVEYS:  LEFS: 29 out of 10  COGNITION: Overall cognitive status: Within functional limits for tasks assessed     SENSATION: WFL  MUSCLE LENGTH: Hamstrings: Right 150 deg; Left 150 deg  POSTURE: No Significant postural limitations  LEG LENGTH: 34 inches bilaterally for true leg length (ASIS to medial malleolus)  LOWER EXTREMITY ROM:  Active ROM Right eval Left eval  Hip flexion 115 110  Hip extension 12 8  Hip abduction 32 21  Hip adduction    Hip internal rotation    Hip external rotation    Knee flexion [facility] WFL  Knee extension [facility] WFL  Ankle dorsiflexion WFL WFL   (Blank rows = not tested)  LOWER EXTREMITY MMT:  MMT Right eval Left eval  Hip flexion 3+ 3   Hip extension 4 3+  Hip abduction 4 3  Hip adduction    Hip internal rotation 3+ 3+  Hip external rotation 4- 3+  Knee flexion 5 5  Knee extension 5 5  Ankle dorsiflexion    Ankle plantarflexion    Ankle inversion    Ankle eversion     (Blank rows = not tested)  LOWER EXTREMITY SPECIAL TESTS:  Hip special tests: Belvie (FABER) test: positive on L and (FADIR) test: negative bilaterally  FUNCTIONAL TESTS:  5 times sit to stand: 22.58 seconds (no UE assist).  L hip IR/ foot IV with STS and while sitting in recliner (reported per pt.).    GAIT: Distance walked: in clinic Assistive device utilized: Quad cane small base Level of assistance: Complete Independence Comments: Antalgic gait,  (+) trendelenburg noted on L side.  Increase L hip/ piriformis pain with recip. Stair climbing, esp. Descending.  Pt. Prefers lateral step pattern while descending.    LEFS: 58 out of 80.  Marked improvement  Active ROM Right eval Left eval  Hip flexion 119 123  Hip extension  (standing) 26 27  Hip abduction 28 26 (pain)  Hip adduction    Hip internal rotation    Hip external rotation    Knee flexion [facility]  Knee extension [facility]  Ankle dorsiflexion [facility] WFL   5xSTS: 23 sec.  (No UE assist) Observed stair mechanics: Pt utilized B handrails and a step through patten when ascending and descending. Pt reported L anterior hip pain when descending. No pain reported on ascending  LEFS: 50 out of 80 (marked improvement from initial evaluation but slight regression from last reassessment).    LOWER EXTREMITY MMT:  MMT Right eval Left eval  Hip flexion 3+  3+  Hip extension    Hip abduction 4+ 4+ (minimal discomfort)  Hip adduction    Hip internal rotation 4 4  Hip external rotation 3+ 3+  Knee flexion 5 5  Knee extension 5 5  Ankle dorsiflexion    Ankle plantarflexion    Ankle inversion    Ankle eversion     (Blank rows = not tested) L Hamstring length: Proximal: 64 deg, Distal: 52 deg  TODAY'S TREATMENT:                                                                                                                              DATE: 10/09/2023  Subjective:  Pt. Reports some difficulty in L anterior hip with step ups/ hip flexion.  Pt. Has been completing hip flexor stretches off edge of bed.  Pt. Reports limited exercising since last PT tx. Session.  There.ex.:  Nustep L4 5 min. B UE/LE.    Walking in //-bars with high marching/ lateral walking (5 laps each).  Cuing for posture correction.    Reviewed HEP  Manual tx.:  Supine R/L LE stretches (focus on on L hip/ piriformis/ glut stretches)-11 minutes.    STM to L anterior hip in supine (remains tight/ thick band of  muscle as compared to R).   R sidelying L hip/ piriformis STM with use of Hypervolt.     PATIENT EDUCATION:  Education details: Pt educated on HEP form and frequency, role of PT going forward Person educated: Patient Education method: Explanation, Demonstration, and Handouts Education comprehension: verbalized understanding and returned demonstration  HOME EXERCISE PROGRAM: Access Code: [email or web address] URL: [email or web address] Date: 06/05/2023 Prepared by: Ozell Sero  Exercises - Supine Hamstring Stretch  - 2 x daily - 7 x weekly - 1 sets - 3 reps - Supine Figure 4 Piriformis Stretch  - 2 x daily - 7 x weekly - 1 sets - 3 reps - 20 seconds hold - Supine Piriformis Stretch with Leg Straight  - 2 x daily - 7 x weekly - 1 sets - 3 reps - 20 seconds hold - Supine Bridge  - 2 x daily - 4-5 x weekly - 2 sets - 10 reps - Seated Piriformis Stretch  - 2 x daily - 7 x weekly - 1 sets - 3 reps - 20 seconds hold - Sit to Stand  - 2 x daily - 4-5 x weekly - 2 sets - 10 reps  ASSESSMENT:  CLINICAL IMPRESSION: Pt. Reports ongoing L anterior hip muscle tightness but less overall hip pain with daily tasks/ walking.  Generalized L hip/LE muscle weakness remains and pt. Understands the importance of completing HEP/ daily walking.  STM relieved L anterior hip tension but trigger point still palpable as compared to R hip.   Pt. Instructed to contact PT if any regression in symptoms or questions.  Pt would be unable to climb a ladder at this time due to her muscle weakness.  See  updated goals.  Pt. Will continue to benefit from skilled PT services to increase hip strength to improve pain-free mobility.       OBJECTIVE IMPAIRMENTS: Abnormal gait, difficulty walking, decreased ROM, decreased strength, impaired flexibility, and pain.   ACTIVITY LIMITATIONS: bending, standing, squatting, and locomotion level  PARTICIPATION LIMITATIONS: cleaning, laundry, community activity, and yard  work  PERSONAL FACTORS: Age, Time since onset of injury/illness/exacerbation, and 3+ comorbidities: Hx of bilateral THA, hx of R TKA, CAD, HTN are also affecting patient's functional outcome.   REHAB POTENTIAL: Good  CLINICAL DECISION MAKING: Stable/uncomplicated  EVALUATION COMPLEXITY: Low   GOALS:  LONG TERM GOALS: Target date: 12/12/2023  Pt will increase LEFS to >60 in order to demonstrate subjective improvement in pain and ADL performance. Baseline: Initial 29.  6/9: 58.  7/24: 50 Goal status: Partially met  2.  Pt will increase L hip AROM by at least 10 degrees in all planes to improve function with squatting/bending activities. Baseline: See above for initial ROM measurements   6/26: see above Goal status: Partially met  3.  Pt will improve bilateral hip MMT scores to at least 4/5 in order to show improved hip musculature strength needed for improved walking/activity tolerance. Baseline: See above for initial MMT scores.  6/26:  pain limted MMT Goal status: Partially met  4.  Pt will decrease 5TSTS by at least 3 seconds in order to demonstrate clinically significant improvement in LE strength. Baseline: eval: 22.58 sec,  6/26: 23 sec Goal status: Not met  5.  Pt will report decrease pain by at least 3 points on NPS scale with walking in order to show improved walking tolerance. Baseline: >6/10 NPS scale with walking on initial evaluation, 6/26: 3/10 NPS scale when walking on even ground, pt reports is higher when walking on uneven ground Goal status: Partially met  6.  Pt will be able to climb ladder with no hip limitations in order to be able to go on Cruise ship at end of year.   Baseline: unable Goal status: Not met  PLAN:  PT FREQUENCY: Biweekly  PT DURATION: 16 weeks  PLANNED INTERVENTIONS: Therapeutic exercises, Therapeutic activity, Neuromuscular re-education, Balance training, Self Care, Joint mobilization, Cryotherapy, Moist heat, Manual therapy, and  Re-evaluation  PLAN FOR NEXT SESSION: Progress HEP  Ozell JAYSON Sero, PT, DPT # [id number] 10/09/2023, 9:40 AM

## 2023-10-15 ENCOUNTER — Encounter: Admitting: Physical Therapy

## 2023-10-22 ENCOUNTER — Ambulatory Visit: Admitting: Physical Therapy

## 2023-10-22 DIAGNOSIS — M25552 Pain in left hip: Secondary | ICD-10-CM | POA: Diagnosis not present

## 2023-10-22 DIAGNOSIS — M2560 Stiffness of unspecified joint, not elsewhere classified: Secondary | ICD-10-CM

## 2023-10-22 DIAGNOSIS — M6281 Muscle weakness (generalized): Secondary | ICD-10-CM

## 2023-10-22 DIAGNOSIS — M25652 Stiffness of left hip, not elsewhere classified: Secondary | ICD-10-CM

## 2023-10-22 DIAGNOSIS — R269 Unspecified abnormalities of gait and mobility: Secondary | ICD-10-CM

## 2023-10-22 NOTE — Therapy (Unsigned)
 OUTPATIENT PHYSICAL THERAPY LOWER EXTREMITY TREATMENT  Patient Name: Meagan Cox MRN: [id number] DOB:1940/05/19, 83 y.o., female Today's Date: 10/22/2023  END OF SESSION:  PT End of Session - 10/22/23 0950     Visit Number 16    Number of Visits 21    Date for Recertification  12/12/23    PT Start Time 0945    PT Stop Time 1036    PT Time Calculation (min) 51 min    Activity Tolerance Patient tolerated treatment well    Behavior During Therapy [facility] for tasks assessed/performed         Past Medical History:  Diagnosis Date   Hyperlipidemia    Hypertension    Hypothyroidism    Past Surgical History:  Procedure Laterality Date   ABDOMINAL HYSTERECTOMY     CARDIAC CATHETERIZATION     30 years ago, [place]   CHOLECYSTECTOMY N/A 08/21/2015   Procedure: LAPAROSCOPIC CHOLECYSTECTOMY;  Surgeon: Laneta JULIANNA Luna, MD;  Location: [facility];  Service: General;  Laterality: N/A;   EYE SURGERY     eye lid lift   KNEE ARTHROSCOPY Right 08/28/2018   Procedure: ARTHROSCOPY KNEE;  Surgeon: Leora Lynwood SAUNDERS, MD;  Location: [facility];  Service: Orthopedics;  Laterality: Right;   PARTIAL HYSTERECTOMY     REPLACEMENT TOTAL KNEE Right 2021   Bowers   right hip replacement Right 2017   Bowers   TONSILLECTOMY     TOTAL HIP ARTHROPLASTY Left    Pt reported left hip replacement done in June   Patient Active Problem List   Diagnosis Date Noted   Postural dizziness with presyncope 08/24/2023   Hypervitaminosis D 12/20/2022   Cough in adult patient 12/20/2022   Encounter for preventive care 12/30/2021   Asymptomatic postmenopausal estrogen deficiency 12/28/2021   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   Aortic atherosclerosis 11/05/2020   Postsurgical menopause 06/09/2020   Breast cancer screening 05/04/2020   Loss of perception for taste 10/19/2017   Essential hypertension 03/05/2017   S/P laparoscopic cholecystectomy 03/05/2017   Ptosis, both eyelids 01/04/2017   Complete tear,  knee, anterior cruciate ligament 08/28/2016   History of total hip arthroplasty 08/28/2016   Tear of meniscus of knee 08/28/2016   Medicare annual wellness visit, subsequent 07/24/2015   Cerebrovascular small vessel disease 04/24/2015   Benign paroxysmal positional vertigo 04/24/2015   Edema 05/14/2012   Polyarthritis of ankle 04/08/2011   Hypothyroidism 12/18/2010   Low back pain radiating to both legs 12/18/2010   Chronic right hip pain 12/18/2010   Obesity 05/09/2010   CAD (coronary artery disease) 05/09/2010   Hyperlipidemia 05/09/2010   PCP: Marylynn Verneita CROME, MD  REFERRING PROVIDER: Leora Lynwood JAYSON Mickey., MD  REFERRING DIAG: [phone] (ICD-10-CM) - Presence of left artificial hip joint   THERAPY DIAG:  Stiffness of left hip, not elsewhere classified  Muscle weakness (generalized)  Pain in left hip  Gait difficulty  Decreased range of motion  Rationale for Evaluation and Treatment: Rehabilitation  ONSET DATE: 09/07/2022  SUBJECTIVE:   SUBJECTIVE STATEMENT: Pt reports to PT with c/c of L lateral hip/ piriformis pain with increase walking/ climbing stairs/ household tasks/ sleeping position.  Pt had L hip replaced on 07/12/2022, which subsequently got infected on 08/01/2022. Pt. Worked hard with PT in past and returns today with increase c/o L hip/piriformis pain symptoms.  Pt. Prescribed Celebrex  but has not started medication. She denies any numbness/tingling or clicking/popping.   PERTINENT HISTORY: Hx of bilateral THA (2017), R TKA (2021),  CAD, HTN  PAIN:  Are you having pain? Yes: NPRS scale: 3/10 currently, >7/10 at worst Pain location: L lateral hip Pain description: sore/ tight Aggravating factors: Walking Relieving factors: Rest, wine  PRECAUTIONS: None  RED FLAGS: None   WEIGHT BEARING RESTRICTIONS: No  FALLS:  Has patient fallen in last 6 months? No falls but several near falls where pt describes losing her balance with forward leaning  LIVING  ENVIRONMENT: Lives with: lives with their daughter Lives in: House/apartment Stairs: Yes: External: 3 steps; can reach both Has following equipment at home: Retail banker - 2 wheeled  OCCUPATION: Retired  PLOF: Independent with household mobility with device   PATIENT GOALS: Get back to walking without pain, get ready for 1 month [place] cruise in December   NEXT MD VISIT: 3rd week of June  OBJECTIVE:   DIAGNOSTIC FINDINGS: N/A  PATIENT SURVEYS:  LEFS: 29 out of 73  COGNITION: Overall cognitive status: Within functional limits for tasks assessed     SENSATION: WFL  MUSCLE LENGTH: Hamstrings: Right 150 deg; Left 150 deg  POSTURE: No Significant postural limitations  LEG LENGTH: 34 inches bilaterally for true leg length (ASIS to medial malleolus)  LOWER EXTREMITY ROM:  Active ROM Right eval Left eval  Hip flexion 115 110  Hip extension 12 8  Hip abduction 32 21  Hip adduction    Hip internal rotation    Hip external rotation    Knee flexion [facility] WFL  Knee extension [facility] WFL  Ankle dorsiflexion WFL WFL   (Blank rows = not tested)  LOWER EXTREMITY MMT:  MMT Right eval Left eval  Hip flexion 3+ 3   Hip extension 4 3+  Hip abduction 4 3  Hip adduction    Hip internal rotation 3+ 3+  Hip external rotation 4- 3+  Knee flexion 5 5  Knee extension 5 5  Ankle dorsiflexion    Ankle plantarflexion    Ankle inversion    Ankle eversion     (Blank rows = not tested)  LOWER EXTREMITY SPECIAL TESTS:  Hip special tests: Belvie (FABER) test: positive on L and (FADIR) test: negative bilaterally  FUNCTIONAL TESTS:  5 times sit to stand: 22.58 seconds (no UE assist).  L hip IR/ foot IV with STS and while sitting in recliner (reported per pt.).    GAIT: Distance walked: in clinic Assistive device utilized: Quad cane small base Level of assistance: Complete Independence Comments: Antalgic gait, (+) trendelenburg noted on L side.  Increase  L hip/ piriformis pain with recip. Stair climbing, esp. Descending.  Pt. Prefers lateral step pattern while descending.    LEFS: 58 out of 80.  Marked improvement  Active ROM Right eval Left eval  Hip flexion 119 123  Hip extension  (standing) 26 27  Hip abduction 28 26 (pain)  Hip adduction    Hip internal rotation    Hip external rotation    Knee flexion [facility]  Knee extension [facility]  Ankle dorsiflexion [facility] WFL   5xSTS: 23 sec.  (No UE assist) Observed stair mechanics: Pt utilized B handrails and a step through patten when ascending and descending. Pt reported L anterior hip pain when descending. No pain reported on ascending  LEFS: 50 out of 80 (marked improvement from initial evaluation but slight regression from last reassessment).    LOWER EXTREMITY MMT:  MMT Right eval Left eval  Hip flexion 3+ 3+  Hip extension    Hip  abduction 4+ 4+ (minimal discomfort)  Hip adduction    Hip internal rotation 4 4  Hip external rotation 3+ 3+  Knee flexion 5 5  Knee extension 5 5  Ankle dorsiflexion    Ankle plantarflexion    Ankle inversion    Ankle eversion     (Blank rows = not tested) L Hamstring length: Proximal: 64 deg, Distal: 52 deg  TODAY'S TREATMENT:                                                                                                                              DATE: 10/22/2023  Subjective:  Pt. reports slight improvement with step ups/hip flexion at home going up her porch steps, but still experiences difficulty in L anterior hip which forcers her to ascend steps on an angle instead of straight up.  Pt. has continued with hip flexor stretches at home but no other HEP exercises at this time.   There.ex.:  Nustep L4 5 min. B UE/LE.  Discussed daily activity/ hip symptoms.    2nd step hip flexor stretches on L/R, 2x30 seconds bilaterally  Walking in //-bars with high marching/ (5 laps each).  Cuing for posture correction.    3-way standing hip  strengthening with mirror feedback, 2x10 bilaterally each  Manual tx.:  Supine R/L LE stretches (focus on on L hip/ piriformis/ glut stretches)-11 minutes.    STM to L anterior hip in supine (remains tight/ thick band of muscle as compared to R).   R sidelying L hip/ piriformis STM with use of Hypervolt.     PATIENT EDUCATION:  Education details: Pt educated on HEP form and frequency, role of PT going forward Person educated: Patient Education method: Explanation, Demonstration, and Handouts Education comprehension: verbalized understanding and returned demonstration  HOME EXERCISE PROGRAM: Access Code: [email or web address] URL: [email or web address] Date: 06/05/2023 Prepared by: Ozell Sero  Exercises - Supine Hamstring Stretch  - 2 x daily - 7 x weekly - 1 sets - 3 reps - Supine Figure 4 Piriformis Stretch  - 2 x daily - 7 x weekly - 1 sets - 3 reps - 20 seconds hold - Supine Piriformis Stretch with Leg Straight  - 2 x daily - 7 x weekly - 1 sets - 3 reps - 20 seconds hold - Supine Bridge  - 2 x daily - 4-5 x weekly - 2 sets - 10 reps - Seated Piriformis Stretch  - 2 x daily - 7 x weekly - 1 sets - 3 reps - 20 seconds hold - Sit to Stand  - 2 x daily - 4-5 x weekly - 2 sets - 10 reps  ASSESSMENT:  CLINICAL IMPRESSION: Pt. introduced to standing 3-way hip exercises in // bars in front of mirror for visual feedback to limit compensation of lumbar spine. Pt able to correct compensation of upper trunk leaning momentarily with frequent verbal cueing but unable to maintain upright posture throughout the entire exercise. Pt introduced  to standing hip flexor stretch on step which she responded well to. Pt continues to experience significant tightness of left hip flexor and quadriceps region which was addressed with STM to the regions which provided temporary relief, but palpable tightness still noted at end of tx session. Pt did not experience a significant increase in pain with any  interventions on this day. Pt. will continue to benefit from skilled PT services to increase gross hip strength to facilitate pain-free mobility.      OBJECTIVE IMPAIRMENTS: Abnormal gait, difficulty walking, decreased ROM, decreased strength, impaired flexibility, and pain.   ACTIVITY LIMITATIONS: bending, standing, squatting, and locomotion level  PARTICIPATION LIMITATIONS: cleaning, laundry, community activity, and yard work  PERSONAL FACTORS: Age, Time since onset of injury/illness/exacerbation, and 3+ comorbidities: Hx of bilateral THA, hx of R TKA, CAD, HTN are also affecting patient's functional outcome.   REHAB POTENTIAL: Good  CLINICAL DECISION MAKING: Stable/uncomplicated  EVALUATION COMPLEXITY: Low   GOALS:  LONG TERM GOALS: Target date: 12/12/2023  Pt will increase LEFS to >60 in order to demonstrate subjective improvement in pain and ADL performance. Baseline: Initial 29.  6/9: 58.  7/24: 50 Goal status: Partially met  2.  Pt will increase L hip AROM by at least 10 degrees in all planes to improve function with squatting/bending activities. Baseline: See above for initial ROM measurements   6/26: see above Goal status: Partially met  3.  Pt will improve bilateral hip MMT scores to at least 4/5 in order to show improved hip musculature strength needed for improved walking/activity tolerance. Baseline: See above for initial MMT scores.  6/26:  pain limted MMT Goal status: Partially met  4.  Pt will decrease 5TSTS by at least 3 seconds in order to demonstrate clinically significant improvement in LE strength. Baseline: eval: 22.58 sec,  6/26: 23 sec Goal status: Not met  5.  Pt will report decrease pain by at least 3 points on NPS scale with walking in order to show improved walking tolerance. Baseline: >6/10 NPS scale with walking on initial evaluation, 6/26: 3/10 NPS scale when walking on even ground, pt reports is higher when walking on uneven ground Goal status:  Partially met  6.  Pt will be able to climb ladder with no hip limitations in order to be able to go on Cruise ship at end of year.   Baseline: unable Goal status: Not met  PLAN:  PT FREQUENCY: Biweekly  PT DURATION: 16 weeks  PLANNED INTERVENTIONS: Therapeutic exercises, Therapeutic activity, Neuromuscular re-education, Balance training, Self Care, Joint mobilization, Cryotherapy, Moist heat, Manual therapy, and Re-evaluation  PLAN FOR NEXT SESSION: Increase functional quad and hip strength to tolerance (sit to stands and/or stairs).  Curtistine Bracket, SPT  Ozell JAYSON Sero, PT, DPT # [phone] 10/22/2023, 10:39 AM

## 2023-10-23 ENCOUNTER — Encounter: Payer: Self-pay | Admitting: Physical Therapy

## 2023-10-29 ENCOUNTER — Encounter: Admitting: Physical Therapy

## 2023-11-05 ENCOUNTER — Encounter: Admitting: Physical Therapy

## 2023-11-07 ENCOUNTER — Other Ambulatory Visit: Payer: Self-pay | Admitting: Internal Medicine

## 2023-11-12 ENCOUNTER — Ambulatory Visit: Attending: Orthopedic Surgery | Admitting: Physical Therapy

## 2023-11-12 DIAGNOSIS — Z96642 Presence of left artificial hip joint: Secondary | ICD-10-CM | POA: Insufficient documentation

## 2023-11-12 DIAGNOSIS — R269 Unspecified abnormalities of gait and mobility: Secondary | ICD-10-CM | POA: Insufficient documentation

## 2023-11-12 DIAGNOSIS — M25652 Stiffness of left hip, not elsewhere classified: Secondary | ICD-10-CM | POA: Insufficient documentation

## 2023-11-12 DIAGNOSIS — M6281 Muscle weakness (generalized): Secondary | ICD-10-CM | POA: Diagnosis not present

## 2023-11-12 DIAGNOSIS — M2560 Stiffness of unspecified joint, not elsewhere classified: Secondary | ICD-10-CM | POA: Diagnosis not present

## 2023-11-12 DIAGNOSIS — M25552 Pain in left hip: Secondary | ICD-10-CM | POA: Insufficient documentation

## 2023-11-12 DIAGNOSIS — R2689 Other abnormalities of gait and mobility: Secondary | ICD-10-CM | POA: Insufficient documentation

## 2023-11-16 NOTE — Therapy (Signed)
 OUTPATIENT PHYSICAL THERAPY LOWER EXTREMITY TREATMENT  Patient Name: Meagan Cox MRN: [id number] DOB:1940/09/26, 83 y.o., female Today's Date: 11/12/23  END OF SESSION:  PT End of Session - 11/16/23 1948     Visit Number 17    Number of Visits 21    Date for Recertification  12/12/23    PT Start Time 0950    PT Stop Time 1030    PT Time Calculation (min) 40 min    Activity Tolerance Patient tolerated treatment well    Behavior During Therapy [facility] for tasks assessed/performed         Past Medical History:  Diagnosis Date   Hyperlipidemia    Hypertension    Hypothyroidism    Past Surgical History:  Procedure Laterality Date   ABDOMINAL HYSTERECTOMY     CARDIAC CATHETERIZATION     30 years ago, [place]   CHOLECYSTECTOMY N/A 08/21/2015   Procedure: LAPAROSCOPIC CHOLECYSTECTOMY;  Surgeon: Laneta JULIANNA Luna, MD;  Location: [facility];  Service: General;  Laterality: N/A;   EYE SURGERY     eye lid lift   KNEE ARTHROSCOPY Right 08/28/2018   Procedure: ARTHROSCOPY KNEE;  Surgeon: Leora Lynwood SAUNDERS, MD;  Location: [facility];  Service: Orthopedics;  Laterality: Right;   PARTIAL HYSTERECTOMY     REPLACEMENT TOTAL KNEE Right 2021   Bowers   right hip replacement Right 2017   Bowers   TONSILLECTOMY     TOTAL HIP ARTHROPLASTY Left    Pt reported left hip replacement done in June   Patient Active Problem List   Diagnosis Date Noted   Postural dizziness with presyncope 08/24/2023   Hypervitaminosis D 12/20/2022   Cough in adult patient 12/20/2022   Encounter for preventive care 12/30/2021   Asymptomatic postmenopausal estrogen deficiency 12/28/2021   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   Aortic atherosclerosis 11/05/2020   Postsurgical menopause 06/09/2020   Breast cancer screening 05/04/2020   Loss of perception for taste 10/19/2017   Essential hypertension 03/05/2017   S/P laparoscopic cholecystectomy 03/05/2017   Ptosis, both eyelids 01/04/2017   Complete tear,  knee, anterior cruciate ligament 08/28/2016   History of total hip arthroplasty 08/28/2016   Tear of meniscus of knee 08/28/2016   Medicare annual wellness visit, subsequent 07/24/2015   Cerebrovascular small vessel disease 04/24/2015   Benign paroxysmal positional vertigo 04/24/2015   Edema 05/14/2012   Polyarthritis of ankle 04/08/2011   Hypothyroidism 12/18/2010   Low back pain radiating to both legs 12/18/2010   Chronic right hip pain 12/18/2010   Obesity 05/09/2010   CAD (coronary artery disease) 05/09/2010   Hyperlipidemia 05/09/2010   PCP: Marylynn Verneita CROME, MD  REFERRING PROVIDER: Leora Lynwood JAYSON Mickey., MD  REFERRING DIAG: [id number] (ICD-10-CM) - Presence of left artificial hip joint   THERAPY DIAG:  Stiffness of left hip, not elsewhere classified  Muscle weakness (generalized)  Pain in left hip  Gait difficulty  Decreased range of motion  Rationale for Evaluation and Treatment: Rehabilitation  ONSET DATE: 09/07/2022  SUBJECTIVE:   SUBJECTIVE STATEMENT: Pt reports to PT with c/c of L lateral hip/ piriformis pain with increase walking/ climbing stairs/ household tasks/ sleeping position.  Pt had L hip replaced on 07/12/2022, which subsequently got infected on 08/01/2022. Pt. Worked hard with PT in past and returns today with increase c/o L hip/piriformis pain symptoms.  Pt. Prescribed Celebrex  but has not started medication. She denies any numbness/tingling or clicking/popping.   PERTINENT HISTORY: Hx of bilateral THA (2017), R TKA (2021),  CAD, HTN  PAIN:  Are you having pain? Yes: NPRS scale: 3/10 currently, >7/10 at worst Pain location: L lateral hip Pain description: sore/ tight Aggravating factors: Walking Relieving factors: Rest, wine  PRECAUTIONS: None  RED FLAGS: None   WEIGHT BEARING RESTRICTIONS: No  FALLS:  Has patient fallen in last 6 months? No falls but several near falls where pt describes losing her balance with forward leaning  LIVING  ENVIRONMENT: Lives with: lives with their daughter Lives in: House/apartment Stairs: Yes: External: 3 steps; can reach both Has following equipment at home: Retail banker - 2 wheeled  OCCUPATION: Retired  PLOF: Independent with household mobility with device   PATIENT GOALS: Get back to walking without pain, get ready for 1 month [place] cruise in December   NEXT MD VISIT: 3rd week of June  OBJECTIVE:   DIAGNOSTIC FINDINGS: N/A  PATIENT SURVEYS:  LEFS: 29 out of 67  COGNITION: Overall cognitive status: Within functional limits for tasks assessed     SENSATION: WFL  MUSCLE LENGTH: Hamstrings: Right 150 deg; Left 150 deg  POSTURE: No Significant postural limitations  LEG LENGTH: 34 inches bilaterally for true leg length (ASIS to medial malleolus)  LOWER EXTREMITY ROM:  Active ROM Right eval Left eval  Hip flexion 115 110  Hip extension 12 8  Hip abduction 32 21  Hip adduction    Hip internal rotation    Hip external rotation    Knee flexion [facility] WFL  Knee extension [facility] WFL  Ankle dorsiflexion WFL WFL   (Blank rows = not tested)  LOWER EXTREMITY MMT:  MMT Right eval Left eval  Hip flexion 3+ 3   Hip extension 4 3+  Hip abduction 4 3  Hip adduction    Hip internal rotation 3+ 3+  Hip external rotation 4- 3+  Knee flexion 5 5  Knee extension 5 5  Ankle dorsiflexion    Ankle plantarflexion    Ankle inversion    Ankle eversion     (Blank rows = not tested)  LOWER EXTREMITY SPECIAL TESTS:  Hip special tests: Belvie (FABER) test: positive on L and (FADIR) test: negative bilaterally  FUNCTIONAL TESTS:  5 times sit to stand: 22.58 seconds (no UE assist).  L hip IR/ foot IV with STS and while sitting in recliner (reported per pt.).    GAIT: Distance walked: in clinic Assistive device utilized: Quad cane small base Level of assistance: Complete Independence Comments: Antalgic gait, (+) trendelenburg noted on L side.  Increase  L hip/ piriformis pain with recip. Stair climbing, esp. Descending.  Pt. Prefers lateral step pattern while descending.    LEFS: 58 out of 80.  Marked improvement  Active ROM Right eval Left eval  Hip flexion 119 123  Hip extension  (standing) 26 27  Hip abduction 28 26 (pain)  Hip adduction    Hip internal rotation    Hip external rotation    Knee flexion [facility]  Knee extension [facility]  Ankle dorsiflexion [facility] WFL   5xSTS: 23 sec.  (No UE assist) Observed stair mechanics: Pt utilized B handrails and a step through patten when ascending and descending. Pt reported L anterior hip pain when descending. No pain reported on ascending  LEFS: 50 out of 80 (marked improvement from initial evaluation but slight regression from last reassessment).    LOWER EXTREMITY MMT:  MMT Right eval Left eval  Hip flexion 3+ 3+  Hip extension    Hip  abduction 4+ 4+ (minimal discomfort)  Hip adduction    Hip internal rotation 4 4  Hip external rotation 3+ 3+  Knee flexion 5 5  Knee extension 5 5  Ankle dorsiflexion    Ankle plantarflexion    Ankle inversion    Ankle eversion     (Blank rows = not tested) L Hamstring length: Proximal: 64 deg, Distal: 52 deg  TODAY'S TREATMENT:                                                                                                                              DATE: 11/12/23  Subjective:  Pt. States she has noticed a marked improvement in cramping/ discomfort since taking over the counter Potassium the past week.    There.ex.:  No charge  Reviewed program/ exercises  Manual tx.:  Supine R/L LE stretches (focus on on L hip flexor- Thomas stretch/ piriformis/ glut stretches)-17 minutes.    STM to L anterior hip in supine (remains tight/ thick band of muscle as compared to R).   R sidelying L hip/ piriformis STM with use of Hypervolt.     PATIENT EDUCATION:  Education details: Pt educated on HEP form and frequency, role of PT going  forward Person educated: Patient Education method: Explanation, Demonstration, and Handouts Education comprehension: verbalized understanding and returned demonstration  HOME EXERCISE PROGRAM: Access Code: [email or web address] URL: [email or web address] Date: 06/05/2023 Prepared by: Ozell Sero  Exercises - Supine Hamstring Stretch  - 2 x daily - 7 x weekly - 1 sets - 3 reps - Supine Figure 4 Piriformis Stretch  - 2 x daily - 7 x weekly - 1 sets - 3 reps - 20 seconds hold - Supine Piriformis Stretch with Leg Straight  - 2 x daily - 7 x weekly - 1 sets - 3 reps - 20 seconds hold - Supine Bridge  - 2 x daily - 4-5 x weekly - 2 sets - 10 reps - Seated Piriformis Stretch  - 2 x daily - 7 x weekly - 1 sets - 3 reps - 20 seconds hold - Sit to Stand  - 2 x daily - 4-5 x weekly - 2 sets - 10 reps  ASSESSMENT:  CLINICAL IMPRESSION: Pt continues to experience tightness of left hip flexor and quadriceps region which was addressed with STM to the regions which provided temporary relief, but palpable tightness still noted at end of tx session. Pt did not experience a significant increase in pain with any interventions on this day.  Pt. Will continue with Potassium supplement to address muscle cramping/ discomfort. Pt. will continue to benefit from skilled PT services to increase gross hip strength to facilitate pain-free mobility.      OBJECTIVE IMPAIRMENTS: Abnormal gait, difficulty walking, decreased ROM, decreased strength, impaired flexibility, and pain.   ACTIVITY LIMITATIONS: bending, standing, squatting, and locomotion level  PARTICIPATION LIMITATIONS: cleaning, laundry, community activity, and yard work  PERSONAL FACTORS: Age, Time since onset of  injury/illness/exacerbation, and 3+ comorbidities: Hx of bilateral THA, hx of R TKA, CAD, HTN are also affecting patient's functional outcome.   REHAB POTENTIAL: Good  CLINICAL DECISION MAKING: Stable/uncomplicated  EVALUATION COMPLEXITY:  Low   GOALS:  LONG TERM GOALS: Target date: 12/12/2023  Pt will increase LEFS to >60 in order to demonstrate subjective improvement in pain and ADL performance. Baseline: Initial 29.  6/9: 58.  7/24: 50 Goal status: Partially met  2.  Pt will increase L hip AROM by at least 10 degrees in all planes to improve function with squatting/bending activities. Baseline: See above for initial ROM measurements   6/26: see above Goal status: Partially met  3.  Pt will improve bilateral hip MMT scores to at least 4/5 in order to show improved hip musculature strength needed for improved walking/activity tolerance. Baseline: See above for initial MMT scores.  6/26:  pain limted MMT Goal status: Partially met  4.  Pt will decrease 5TSTS by at least 3 seconds in order to demonstrate clinically significant improvement in LE strength. Baseline: eval: 22.58 sec,  6/26: 23 sec Goal status: Not met  5.  Pt will report decrease pain by at least 3 points on NPS scale with walking in order to show improved walking tolerance. Baseline: >6/10 NPS scale with walking on initial evaluation, 6/26: 3/10 NPS scale when walking on even ground, pt reports is higher when walking on uneven ground Goal status: Partially met  6.  Pt will be able to climb ladder with no hip limitations in order to be able to go on Cruise ship at end of year.   Baseline: unable Goal status: Not met  PLAN:  PT FREQUENCY: Biweekly  PT DURATION: 16 weeks  PLANNED INTERVENTIONS: Therapeutic exercises, Therapeutic activity, Neuromuscular re-education, Balance training, Self Care, Joint mobilization, Cryotherapy, Moist heat, Manual therapy, and Re-evaluation  PLAN FOR NEXT SESSION: Increase functional quad and hip strength to tolerance (sit to stands and/or stairs).  Ozell JAYSON Sero, PT, DPT # [phone] 11/16/2023, 7:49 PM

## 2023-11-18 ENCOUNTER — Other Ambulatory Visit: Payer: Self-pay | Admitting: Internal Medicine

## 2023-11-18 DIAGNOSIS — E034 Atrophy of thyroid (acquired): Secondary | ICD-10-CM

## 2023-11-19 ENCOUNTER — Encounter: Admitting: Physical Therapy

## 2023-11-26 ENCOUNTER — Encounter: Payer: Self-pay | Admitting: Physical Therapy

## 2023-11-26 ENCOUNTER — Ambulatory Visit: Admitting: Physical Therapy

## 2023-11-26 DIAGNOSIS — M6281 Muscle weakness (generalized): Secondary | ICD-10-CM

## 2023-11-26 DIAGNOSIS — M25652 Stiffness of left hip, not elsewhere classified: Secondary | ICD-10-CM

## 2023-11-26 DIAGNOSIS — M2560 Stiffness of unspecified joint, not elsewhere classified: Secondary | ICD-10-CM

## 2023-11-26 DIAGNOSIS — M25552 Pain in left hip: Secondary | ICD-10-CM

## 2023-11-26 DIAGNOSIS — R269 Unspecified abnormalities of gait and mobility: Secondary | ICD-10-CM

## 2023-11-26 DIAGNOSIS — Z96642 Presence of left artificial hip joint: Secondary | ICD-10-CM

## 2023-11-26 DIAGNOSIS — R2689 Other abnormalities of gait and mobility: Secondary | ICD-10-CM

## 2023-11-26 NOTE — Therapy (Signed)
 OUTPATIENT PHYSICAL THERAPY LOWER EXTREMITY TREATMENT  Patient Name: Meagan Cox MRN: [id number] DOB:September 03, 1940, 83 y.o., female Today's Date: 11/26/2023  END OF SESSION:  PT End of Session - 11/26/23 0943     Visit Number 18    Number of Visits 21    Date for Recertification  12/12/23    PT Start Time 0943    Activity Tolerance Patient tolerated treatment well    Behavior During Therapy [facility] for tasks assessed/performed         Past Medical History:  Diagnosis Date   Hyperlipidemia    Hypertension    Hypothyroidism    Past Surgical History:  Procedure Laterality Date   ABDOMINAL HYSTERECTOMY     CARDIAC CATHETERIZATION     30 years ago, [place]   CHOLECYSTECTOMY N/A 08/21/2015   Procedure: LAPAROSCOPIC CHOLECYSTECTOMY;  Surgeon: Laneta JULIANNA Luna, MD;  Location: [facility];  Service: General;  Laterality: N/A;   EYE SURGERY     eye lid lift   KNEE ARTHROSCOPY Right 08/28/2018   Procedure: ARTHROSCOPY KNEE;  Surgeon: Leora Lynwood SAUNDERS, MD;  Location: [facility];  Service: Orthopedics;  Laterality: Right;   PARTIAL HYSTERECTOMY     REPLACEMENT TOTAL KNEE Right 2021   [facility]   right hip replacement Right 2017   [facility]   TONSILLECTOMY     TOTAL HIP ARTHROPLASTY Left    Pt reported left hip replacement done in June   Patient Active Problem List   Diagnosis Date Noted   Postural dizziness with presyncope 08/24/2023   Hypervitaminosis D 12/20/2022   Cough in adult patient 12/20/2022   Encounter for preventive care 12/30/2021   Asymptomatic postmenopausal estrogen deficiency 12/28/2021   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   Aortic atherosclerosis 11/05/2020   Postsurgical menopause 06/09/2020   Breast cancer screening 05/04/2020   Loss of perception for taste 10/19/2017   Essential hypertension 03/05/2017   S/P laparoscopic cholecystectomy 03/05/2017   Ptosis, both eyelids 01/04/2017   Complete tear, knee, anterior cruciate ligament 08/28/2016   History of  total hip arthroplasty 08/28/2016   Tear of meniscus of knee 08/28/2016   Medicare annual wellness visit, subsequent 07/24/2015   Cerebrovascular small vessel disease 04/24/2015   Benign paroxysmal positional vertigo 04/24/2015   Edema 05/14/2012   Polyarthritis of ankle 04/08/2011   Hypothyroidism 12/18/2010   Low back pain radiating to both legs 12/18/2010   Chronic right hip pain 12/18/2010   Obesity 05/09/2010   CAD (coronary artery disease) 05/09/2010   Hyperlipidemia 05/09/2010   PCP: Marylynn Verneita CROME, MD  REFERRING PROVIDER: Leora Lynwood JAYSON Mickey., MD  REFERRING DIAG: [id number] (ICD-10-CM) - Presence of left artificial hip joint   THERAPY DIAG:  Stiffness of left hip, not elsewhere classified  Muscle weakness (generalized)  Pain in left hip  Gait difficulty  Decreased range of motion  Other abnormalities of gait and mobility  Status post total replacement of left hip  Rationale for Evaluation and Treatment: Rehabilitation  ONSET DATE: 09/07/2022  SUBJECTIVE:   SUBJECTIVE STATEMENT: Pt reports to PT with c/c of L lateral hip/ piriformis pain with increase walking/ climbing stairs/ household tasks/ sleeping position.  Pt had L hip replaced on 07/12/2022, which subsequently got infected on 08/01/2022. Pt. Worked hard with PT in past and returns today with increase c/o L hip/piriformis pain symptoms.  Pt. Prescribed Celebrex  but has not started medication. She denies any numbness/tingling or clicking/popping.   PERTINENT HISTORY: Hx of bilateral THA (2017), R TKA (2021), CAD,  HTN  PAIN:  Are you having pain? Yes: NPRS scale: 3/10 currently, >7/10 at worst Pain location: L lateral hip Pain description: sore/ tight Aggravating factors: Walking Relieving factors: Rest, wine  PRECAUTIONS: None  RED FLAGS: None   WEIGHT BEARING RESTRICTIONS: No  FALLS:  Has patient fallen in last 6 months? No falls but several near falls where pt describes losing her balance with  forward leaning  LIVING ENVIRONMENT: Lives with: lives with their daughter Lives in: House/apartment Stairs: Yes: External: 3 steps; can reach both Has following equipment at home: [employer] - 2 wheeled  OCCUPATION: Retired  PLOF: Independent with household mobility with device   PATIENT GOALS: Get back to walking without pain, get ready for 1 month [place] cruise in December   NEXT MD VISIT: 3rd week of June  OBJECTIVE:   DIAGNOSTIC FINDINGS: N/A  PATIENT SURVEYS:  LEFS: 29 out of 29  COGNITION: Overall cognitive status: Within functional limits for tasks assessed     SENSATION: WFL  MUSCLE LENGTH: Hamstrings: Right 150 deg; Left 150 deg  POSTURE: No Significant postural limitations  LEG LENGTH: 34 inches bilaterally for true leg length (ASIS to medial malleolus)  LOWER EXTREMITY ROM:  Active ROM Right eval Left eval  Hip flexion 115 110  Hip extension 12 8  Hip abduction 32 21  Hip adduction    Hip internal rotation    Hip external rotation    Knee flexion [facility] WFL  Knee extension [facility] WFL  Ankle dorsiflexion WFL WFL   (Blank rows = not tested)  LOWER EXTREMITY MMT:  MMT Right eval Left eval  Hip flexion 3+ 3   Hip extension 4 3+  Hip abduction 4 3  Hip adduction    Hip internal rotation 3+ 3+  Hip external rotation 4- 3+  Knee flexion 5 5  Knee extension 5 5  Ankle dorsiflexion    Ankle plantarflexion    Ankle inversion    Ankle eversion     (Blank rows = not tested)  LOWER EXTREMITY SPECIAL TESTS:  Hip special tests: [place] (FABER) test: positive on L and (FADIR) test: negative bilaterally  FUNCTIONAL TESTS:  5 times sit to stand: 22.58 seconds (no UE assist).  L hip IR/ foot IV with STS and while sitting in recliner (reported per pt.).    GAIT: Distance walked: in clinic Assistive device utilized: Quad cane small base Level of assistance: Complete Independence Comments: Antalgic gait, (+) trendelenburg  noted on L side.  Increase L hip/ piriformis pain with recip. Stair climbing, esp. Descending.  Pt. Prefers lateral step pattern while descending.    LEFS: 58 out of 80.  Marked improvement  Active ROM Right eval Left eval  Hip flexion 119 123  Hip extension  (standing) 26 27  Hip abduction 28 26 (pain)  Hip adduction    Hip internal rotation    Hip external rotation    Knee flexion [facility]  Knee extension [facility]  Ankle dorsiflexion [facility] WFL   5xSTS: 23 sec.  (No UE assist) Observed stair mechanics: Pt utilized B handrails and a step through patten when ascending and descending. Pt reported L anterior hip pain when descending. No pain reported on ascending  LEFS: 50 out of 80 (marked improvement from initial evaluation but slight regression from last reassessment).    LOWER EXTREMITY MMT:  MMT Right eval Left eval  Hip flexion 3+ 3+  Hip extension    Hip abduction  4+ 4+ (minimal discomfort)  Hip adduction    Hip internal rotation 4 4  Hip external rotation 3+ 3+  Knee flexion 5 5  Knee extension 5 5  Ankle dorsiflexion    Ankle plantarflexion    Ankle inversion    Ankle eversion     (Blank rows = not tested) L Hamstring length: Proximal: 64 deg, Distal: 52 deg  TODAY'S TREATMENT:                                                                                                                              DATE: 11/12/23  Subjective:  Pt. States she has noticed a marked improvement in cramping/ discomfort since taking over the counter Potassium the past week.    LOWER EXTREMITY MMT:  MMT Right eval Left eval  Hip flexion 3+/ 4- 3/4-  Hip extension 4/4 3+/4  Hip abduction 4 3  Hip adduction    Hip internal rotation 3+/4 3+/3+  Hip external rotation 4-/4 3+/3+  Knee flexion 5 5  Knee extension 5 5  Ankle dorsiflexion    Ankle plantarflexion    Ankle inversion    Ankle eversion     (Blank rows = not tested)  There.ex.:  No charge  Reviewed program/  exercises  Manual tx.:  Supine R/L LE stretches (focus on on L hip flexor- Thomas stretch/ piriformis/ glut stretches)-17 minutes.    STM to L anterior hip in supine (remains tight/ thick band of muscle as compared to R).   R sidelying L hip/ piriformis STM with use of Hypervolt.     PATIENT EDUCATION:  Education details: Pt educated on HEP form and frequency, role of PT going forward Person educated: Patient Education method: Explanation, Demonstration, and Handouts Education comprehension: verbalized understanding and returned demonstration  HOME EXERCISE PROGRAM: Access Code: [email or web address] URL: [email or web address] Date: 06/05/2023 Prepared by: Ozell Sero  Exercises - Supine Hamstring Stretch  - 2 x daily - 7 x weekly - 1 sets - 3 reps - Supine Figure 4 Piriformis Stretch  - 2 x daily - 7 x weekly - 1 sets - 3 reps - 20 seconds hold - Supine Piriformis Stretch with Leg Straight  - 2 x daily - 7 x weekly - 1 sets - 3 reps - 20 seconds hold - Supine Bridge  - 2 x daily - 4-5 x weekly - 2 sets - 10 reps - Seated Piriformis Stretch  - 2 x daily - 7 x weekly - 1 sets - 3 reps - 20 seconds hold - Sit to Stand  - 2 x daily - 4-5 x weekly - 2 sets - 10 reps  ASSESSMENT:  CLINICAL IMPRESSION: Pt continues to experience tightness of left hip flexor and quadriceps region which was addressed with STM to the regions which provided temporary relief, but palpable tightness still noted at end of tx session. Pt did not experience a significant increase in pain with any interventions on  this day.  Pt. Will continue with Potassium supplement to address muscle cramping/ discomfort. Pt. will continue to benefit from skilled PT services to increase gross hip strength to facilitate pain-free mobility.      OBJECTIVE IMPAIRMENTS: Abnormal gait, difficulty walking, decreased ROM, decreased strength, impaired flexibility, and pain.   ACTIVITY LIMITATIONS: bending, standing, squatting, and  locomotion level  PARTICIPATION LIMITATIONS: cleaning, laundry, community activity, and yard work  PERSONAL FACTORS: Age, Time since onset of injury/illness/exacerbation, and 3+ comorbidities: Hx of bilateral THA, hx of R TKA, CAD, HTN are also affecting patient's functional outcome.   REHAB POTENTIAL: Good  CLINICAL DECISION MAKING: Stable/uncomplicated  EVALUATION COMPLEXITY: Low   GOALS:  LONG TERM GOALS: Target date: 12/12/2023  Pt will increase LEFS to >60 in order to demonstrate subjective improvement in pain and ADL performance. Baseline: Initial 29.  6/9: 58.  7/24: 50 Goal status: Partially met  2.  Pt will increase L hip AROM by at least 10 degrees in all planes to improve function with squatting/bending activities. Baseline: See above for initial ROM measurements   6/26: see above Goal status: Partially met  3.  Pt will improve bilateral hip MMT scores to at least 4/5 in order to show improved hip musculature strength needed for improved walking/activity tolerance. Baseline: See above for initial MMT scores.  6/26:  pain limted MMT Goal status: Partially met  4.  Pt will decrease 5TSTS by at least 3 seconds in order to demonstrate clinically significant improvement in LE strength. Baseline: eval: 22.58 sec,  6/26: 23 sec Goal status: Not met  5.  Pt will report decrease pain by at least 3 points on NPS scale with walking in order to show improved walking tolerance. Baseline: >6/10 NPS scale with walking on initial evaluation, 6/26: 3/10 NPS scale when walking on even ground, pt reports is higher when walking on uneven ground Goal status: Partially met  6.  Pt will be able to climb ladder with no hip limitations in order to be able to go on Cruise ship at end of year.   Baseline: unable Goal status: Not met  PLAN:  PT FREQUENCY: Biweekly  PT DURATION: 16 weeks  PLANNED INTERVENTIONS: Therapeutic exercises, Therapeutic activity, Neuromuscular re-education,  Balance training, Self Care, Joint mobilization, Cryotherapy, Moist heat, Manual therapy, and Re-evaluation  PLAN FOR NEXT SESSION: Increase functional quad and hip strength to tolerance (sit to stands and/or stairs).  Ozell JAYSON Sero, PT, DPT # [id number] 11/26/2023, 9:44 AM

## 2023-12-02 ENCOUNTER — Encounter: Admitting: Physical Therapy

## 2023-12-03 ENCOUNTER — Encounter: Admitting: Physical Therapy

## 2023-12-10 ENCOUNTER — Encounter: Payer: Self-pay | Admitting: Physical Therapy

## 2023-12-10 ENCOUNTER — Ambulatory Visit: Attending: Orthopedic Surgery | Admitting: Physical Therapy

## 2023-12-10 DIAGNOSIS — M25552 Pain in left hip: Secondary | ICD-10-CM | POA: Diagnosis not present

## 2023-12-10 DIAGNOSIS — R2689 Other abnormalities of gait and mobility: Secondary | ICD-10-CM | POA: Diagnosis not present

## 2023-12-10 DIAGNOSIS — M25652 Stiffness of left hip, not elsewhere classified: Secondary | ICD-10-CM | POA: Insufficient documentation

## 2023-12-10 DIAGNOSIS — M2560 Stiffness of unspecified joint, not elsewhere classified: Secondary | ICD-10-CM | POA: Diagnosis not present

## 2023-12-10 DIAGNOSIS — M6281 Muscle weakness (generalized): Secondary | ICD-10-CM | POA: Insufficient documentation

## 2023-12-10 DIAGNOSIS — R269 Unspecified abnormalities of gait and mobility: Secondary | ICD-10-CM | POA: Insufficient documentation

## 2023-12-10 NOTE — Therapy (Addendum)
 OUTPATIENT PHYSICAL THERAPY LOWER EXTREMITY TREATMENT  Patient Name: Colleen Kotlarz MRN: [id number] DOB:[birth year], 83 y.o., female Today's Date: 12/10/2023  END OF SESSION:  PT End of Session - 12/10/23 0944     Visit Number 19    Number of Visits 21    Date for Recertification  12/12/23    PT Start Time 0944    Activity Tolerance Patient tolerated treatment well    Behavior During Therapy [facility] for tasks assessed/performed         0944 to 1033  (49 minutes)  Past Medical History:  Diagnosis Date   Hyperlipidemia    Hypertension    Hypothyroidism    Past Surgical History:  Procedure Laterality Date   ABDOMINAL HYSTERECTOMY     CARDIAC CATHETERIZATION     30 years ago, [place]   CHOLECYSTECTOMY N/A 08/21/2015   Procedure: LAPAROSCOPIC CHOLECYSTECTOMY;  Surgeon: Laneta JULIANNA Luna, MD;  Location: [facility];  Service: General;  Laterality: N/A;   EYE SURGERY     eye lid lift   KNEE ARTHROSCOPY Right 08/28/2018   Procedure: ARTHROSCOPY KNEE;  Surgeon: Leora Lynwood SAUNDERS, MD;  Location: [facility];  Service: Orthopedics;  Laterality: Right;   PARTIAL HYSTERECTOMY     REPLACEMENT TOTAL KNEE Right 2021   [facility]ers   right hip replacement Right 2017   [facility]ers   TONSILLECTOMY     TOTAL HIP ARTHROPLASTY Left    Pt reported left hip replacement done in June   Patient Active Problem List   Diagnosis Date Noted   Postural dizziness with presyncope 08/24/2023   Hypervitaminosis D 12/20/2022   Cough in adult patient 12/20/2022   Encounter for preventive care 12/30/2021   Asymptomatic postmenopausal estrogen deficiency 12/28/2021   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   Aortic atherosclerosis 11/05/2020   Postsurgical menopause 06/09/2020   Breast cancer screening 05/04/2020   Loss of perception for taste 10/19/2017   Essential hypertension 03/05/2017   S/P laparoscopic cholecystectomy 03/05/2017   Ptosis, both eyelids 01/04/2017   Complete tear, knee, anterior cruciate  ligament 08/28/2016   History of total hip arthroplasty 08/28/2016   Tear of meniscus of knee 08/28/2016   Medicare annual wellness visit, subsequent 07/24/2015   Cerebrovascular small vessel disease 04/24/2015   Benign paroxysmal positional vertigo 04/24/2015   Edema 05/14/2012   Polyarthritis of ankle 04/08/2011   Hypothyroidism 12/18/2010   Low back pain radiating to both legs 12/18/2010   Chronic right hip pain 12/18/2010   Obesity 05/09/2010   CAD (coronary artery disease) 05/09/2010   Hyperlipidemia 05/09/2010   PCP: Marylynn Verneita CROME, MD  REFERRING PROVIDER: Leora Lynwood JAYSON Mickey., MD  REFERRING DIAG: [phone] (ICD-10-CM) - Presence of left artificial hip joint   THERAPY DIAG:  Stiffness of left hip, not elsewhere classified  Muscle weakness (generalized)  Pain in left hip  Gait difficulty  Decreased range of motion  Other abnormalities of gait and mobility  Rationale for Evaluation and Treatment: Rehabilitation  ONSET DATE: 09/07/2022  SUBJECTIVE:   SUBJECTIVE STATEMENT: Pt reports to PT with c/c of L lateral hip/ piriformis pain with increase walking/ climbing stairs/ household tasks/ sleeping position.  Pt had L hip replaced on 07/12/2022, which subsequently got infected on 08/01/2022. Pt. Worked hard with PT in past and returns today with increase c/o L hip/piriformis pain symptoms.  Pt. Prescribed Celebrex  but has not started medication. She denies any numbness/tingling or clicking/popping.   PERTINENT HISTORY: Hx of bilateral THA (2017), R TKA (2021), CAD, HTN  PAIN:  Are you having pain? Yes: NPRS scale: 3/10 currently, >7/10 at worst Pain location: L lateral hip Pain description: sore/ tight Aggravating factors: Walking Relieving factors: Rest, wine  PRECAUTIONS: None  RED FLAGS: None   WEIGHT BEARING RESTRICTIONS: No  FALLS:  Has patient fallen in last 6 months? No falls but several near falls where pt describes losing her balance with forward  leaning  LIVING ENVIRONMENT: Lives with: lives with their daughter Lives in: House/apartment Stairs: Yes: External: 3 steps; can reach both Has following equipment at home: [employer] - 2 wheeled  OCCUPATION: Retired  PLOF: Independent with household mobility with device   PATIENT GOALS: Get back to walking without pain, get ready for 1 month [place] cruise in December   NEXT MD VISIT: 3rd week of June  OBJECTIVE:   DIAGNOSTIC FINDINGS: N/A  PATIENT SURVEYS:  LEFS: 29 out of 65  COGNITION: Overall cognitive status: Within functional limits for tasks assessed     SENSATION: WFL  MUSCLE LENGTH: Hamstrings: Right 150 deg; Left 150 deg  POSTURE: No Significant postural limitations  LEG LENGTH: 34 inches bilaterally for true leg length (ASIS to medial malleolus)  LOWER EXTREMITY ROM:  Active ROM Right eval Left eval  Hip flexion 115 110  Hip extension 12 8  Hip abduction 32 21  Hip adduction    Hip internal rotation    Hip external rotation    Knee flexion [facility] WFL  Knee extension [facility] WFL  Ankle dorsiflexion WFL WFL   (Blank rows = not tested)  LOWER EXTREMITY MMT:  MMT Right eval Left eval  Hip flexion 3+ 3   Hip extension 4 3+  Hip abduction 4 3  Hip adduction    Hip internal rotation 3+ 3+  Hip external rotation 4- 3+  Knee flexion 5 5  Knee extension 5 5  Ankle dorsiflexion    Ankle plantarflexion    Ankle inversion    Ankle eversion     (Blank rows = not tested)  LOWER EXTREMITY SPECIAL TESTS:  Hip special tests: Belvie (FABER) test: positive on L and (FADIR) test: negative bilaterally  FUNCTIONAL TESTS:  5 times sit to stand: 22.58 seconds (no UE assist).  L hip IR/ foot IV with STS and while sitting in recliner (reported per pt.).    GAIT: Distance walked: in clinic Assistive device utilized: Quad cane small base Level of assistance: Complete Independence Comments: Antalgic gait, (+) trendelenburg noted on  L side.  Increase L hip/ piriformis pain with recip. Stair climbing, esp. Descending.  Pt. Prefers lateral step pattern while descending.    LEFS: 58 out of 80.  Marked improvement  Active ROM Right eval Left eval  Hip flexion 119 123  Hip extension  (standing) 26 27  Hip abduction 28 26 (pain)  Hip adduction    Hip internal rotation    Hip external rotation    Knee flexion [facility]  Knee extension [facility]  Ankle dorsiflexion [facility] WFL   5xSTS: 23 sec.  (No UE assist) Observed stair mechanics: Pt utilized B handrails and a step through patten when ascending and descending. Pt reported L anterior hip pain when descending. No pain reported on ascending  LEFS: 50 out of 80 (marked improvement from initial evaluation but slight regression from last reassessment).    LOWER EXTREMITY MMT:  MMT Right eval Left eval  Hip flexion 3+ 3+  Hip extension    Hip abduction 4+ 4+ (  minimal discomfort)  Hip adduction    Hip internal rotation 4 4  Hip external rotation 3+ 3+  Knee flexion 5 5  Knee extension 5 5  Ankle dorsiflexion    Ankle plantarflexion    Ankle inversion    Ankle eversion     (Blank rows = not tested) L Hamstring length: Proximal: 64 deg, Distal: 52 deg  LOWER EXTREMITY MMT:  (11/26/23)  MMT Right eval Left eval  Hip flexion 3+/ 4- 3/4-  Hip extension 4/4 3+/4  Hip abduction 4 3  Hip adduction    Hip internal rotation 3+/4 3+/3+  Hip external rotation 4-/4 3+/3+  Knee flexion 5 5  Knee extension 5 5  Ankle dorsiflexion    Ankle plantarflexion    Ankle inversion    Ankle eversion     (Blank rows = not tested)   BOLD numbers are updated MMT  TODAY'S TREATMENT:                                                                                                                              DATE: 12/10/2023  Subjective:  Pt. Has continued to take Potassium supplement with benefits in muscle cramping.  Pt. States she is still limited with L hip flexor tightness/  discomfort.  PT reviewed pts. POC and discuss upcoming trip at end of November.    LEFS: 66 out of 80 (marked increase)  There.act.:    Reassessment of step ups/ stairs.  Standing hip extension in front of mat table/ supine position with holds (pain in supine).    Manual tx.:  Supine R/L LE stretches (focus on on L hip flexor- Thomas stretch/ piriformis/ glut stretches)- 17 minutes.    STM to L anterior hip in supine (remains tight/ thick band of muscle as compared to R).    R sidelying L hip/ piriformis STM with use of Hypervolt.     PATIENT EDUCATION:  Education details: Pt educated on HEP form and frequency, role of PT going forward Person educated: Patient Education method: Explanation, Demonstration, and Handouts Education comprehension: verbalized understanding and returned demonstration  HOME EXERCISE PROGRAM: Access Code: [email or web address] URL: [email or web address] Date: 06/05/2023 Prepared by: Ozell Sero  Exercises - Supine Hamstring Stretch  - 2 x daily - 7 x weekly - 1 sets - 3 reps - Supine Figure 4 Piriformis Stretch  - 2 x daily - 7 x weekly - 1 sets - 3 reps - 20 seconds hold - Supine Piriformis Stretch with Leg Straight  - 2 x daily - 7 x weekly - 1 sets - 3 reps - 20 seconds hold - Supine Bridge  - 2 x daily - 4-5 x weekly - 2 sets - 10 reps - Seated Piriformis Stretch  - 2 x daily - 7 x weekly - 1 sets - 3 reps - 20 seconds hold - Sit to Stand  - 2 x daily - 4-5 x weekly - 2 sets -  10 reps  ASSESSMENT:  CLINICAL IMPRESSION: Pt continues to experience tightness of left hip flexor and quadriceps region which was addressed with STM to the regions which provided temporary relief, but palpable tightness still noted at end of tx session. Pt did not experience a significant increase or c/o muscle cramping with any interventions on this day.  Pt. Will continue with Potassium supplement to address muscle cramping/ discomfort.  Pt. Has cruise trip coming up at  end of month.  Pt. will continue to benefit from skilled PT services to increase gross hip strength to facilitate pain-free mobility.      OBJECTIVE IMPAIRMENTS: Abnormal gait, difficulty walking, decreased ROM, decreased strength, impaired flexibility, and pain.   ACTIVITY LIMITATIONS: bending, standing, squatting, and locomotion level  PARTICIPATION LIMITATIONS: cleaning, laundry, community activity, and yard work  PERSONAL FACTORS: Age, Time since onset of injury/illness/exacerbation, and 3+ comorbidities: Hx of bilateral THA, hx of R TKA, CAD, HTN are also affecting patient's functional outcome.   REHAB POTENTIAL: Good  CLINICAL DECISION MAKING: Stable/uncomplicated  EVALUATION COMPLEXITY: Low   GOALS:  LONG TERM GOALS: Target date: 12/12/2023  Pt will increase LEFS to >60 in order to demonstrate subjective improvement in pain and ADL performance. Baseline: Initial 29.  6/9: 58.  7/24: 50.  11/11: 66 (goal met) Goal status: Goal met  2.  Pt will increase L hip AROM by at least 10 degrees in all planes to improve function with squatting/bending activities. Baseline: See above for initial ROM measurements   6/26: see above Goal status: Partially met  3.  Pt will improve bilateral hip MMT scores to at least 4/5 in order to show improved hip musculature strength needed for improved walking/activity tolerance. Baseline: See above for initial MMT scores.  6/26:  pain limted MMT Goal status: Partially met  4.  Pt will decrease 5TSTS by at least 3 seconds in order to demonstrate clinically significant improvement in LE strength. Baseline: eval: 22.58 sec,  6/26: 23 sec Goal status: Not met  5.  Pt will report decrease pain by at least 3 points on NPS scale with walking in order to show improved walking tolerance. Baseline: >6/10 NPS scale with walking on initial evaluation, 6/26: 3/10 NPS scale when walking on even ground, pt reports is higher when walking on uneven ground Goal  status: Partially met  6.  Pt will be able to climb ladder with no hip limitations in order to be able to go on Cruise ship at end of year.   Baseline: unable Goal status: Not met  PLAN:  PT FREQUENCY: Biweekly  PT DURATION: 16 weeks  PLANNED INTERVENTIONS: Therapeutic exercises, Therapeutic activity, Neuromuscular re-education, Balance training, Self Care, Joint mobilization, Cryotherapy, Moist heat, Manual therapy, and Re-evaluation  PLAN FOR NEXT SESSION: Increase functional quad and hip strength to tolerance (sit to stands and/or stairs).  CHECK GOALS (RECERT vs. DISCHARGE).  Ozell JAYSON Sero, PT, DPT # [phone] 12/10/2023, 9:44 AM

## 2023-12-16 DIAGNOSIS — M7062 Trochanteric bursitis, left hip: Secondary | ICD-10-CM | POA: Diagnosis not present

## 2023-12-17 ENCOUNTER — Ambulatory Visit: Admitting: Physical Therapy

## 2023-12-17 ENCOUNTER — Encounter: Payer: Self-pay | Admitting: Physical Therapy

## 2023-12-17 DIAGNOSIS — M25552 Pain in left hip: Secondary | ICD-10-CM

## 2023-12-17 DIAGNOSIS — M6281 Muscle weakness (generalized): Secondary | ICD-10-CM

## 2023-12-17 DIAGNOSIS — M25652 Stiffness of left hip, not elsewhere classified: Secondary | ICD-10-CM

## 2023-12-17 DIAGNOSIS — M2560 Stiffness of unspecified joint, not elsewhere classified: Secondary | ICD-10-CM

## 2023-12-17 DIAGNOSIS — R269 Unspecified abnormalities of gait and mobility: Secondary | ICD-10-CM

## 2023-12-24 ENCOUNTER — Encounter: Admitting: Physical Therapy

## 2023-12-27 NOTE — Therapy (Signed)
 OUTPATIENT PHYSICAL THERAPY LOWER EXTREMITY TREATMENT/ RECERTIFICATION and DISCHARGE  Physical Therapy Progress Note  Dates of reporting period  07/18/23   to   12/17/23   Patient Name: Meagan Cox MRN: [id number] DOB:1940-12-28, 83 y.o., female Today's Date: 12/17/23  END OF SESSION:  PT End of Session - 12/27/23 1059     Visit Number 20    Number of Visits 21    Date for Recertification  12/18/23    PT Start Time 0941    PT Stop Time 1030    PT Time Calculation (min) 49 min    Activity Tolerance Patient tolerated treatment well    Behavior During Therapy [facility] for tasks assessed/performed         Past Medical History:  Diagnosis Date   Hyperlipidemia    Hypertension    Hypothyroidism    Past Surgical History:  Procedure Laterality Date   ABDOMINAL HYSTERECTOMY     CARDIAC CATHETERIZATION     30 years ago, [place]   CHOLECYSTECTOMY N/A 08/21/2015   Procedure: LAPAROSCOPIC CHOLECYSTECTOMY;  Surgeon: Laneta JULIANNA Luna, MD;  Location: [facility];  Service: General;  Laterality: N/A;   EYE SURGERY     eye lid lift   KNEE ARTHROSCOPY Right 08/28/2018   Procedure: ARTHROSCOPY KNEE;  Surgeon: Leora Lynwood SAUNDERS, MD;  Location: [facility];  Service: Orthopedics;  Laterality: Right;   PARTIAL HYSTERECTOMY     REPLACEMENT TOTAL KNEE Right 2021   [facility]   right hip replacement Right 2017   [facility]   TONSILLECTOMY     TOTAL HIP ARTHROPLASTY Left    Pt reported left hip replacement done in June   Patient Active Problem List   Diagnosis Date Noted   Postural dizziness with presyncope 08/24/2023   Hypervitaminosis D 12/20/2022   Cough in adult patient 12/20/2022   Encounter for preventive care 12/30/2021   Asymptomatic postmenopausal estrogen deficiency 12/28/2021   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   Aortic atherosclerosis 11/05/2020   Postsurgical menopause 06/09/2020   Breast cancer screening 05/04/2020   Loss of perception for taste 10/19/2017   Essential  hypertension 03/05/2017   S/P laparoscopic cholecystectomy 03/05/2017   Ptosis, both eyelids 01/04/2017   Complete tear, knee, anterior cruciate ligament 08/28/2016   History of total hip arthroplasty 08/28/2016   Tear of meniscus of knee 08/28/2016   Medicare annual wellness visit, subsequent 07/24/2015   Cerebrovascular small vessel disease 04/24/2015   Benign paroxysmal positional vertigo 04/24/2015   Edema 05/14/2012   Polyarthritis of ankle 04/08/2011   Hypothyroidism 12/18/2010   Low back pain radiating to both legs 12/18/2010   Chronic right hip pain 12/18/2010   Obesity 05/09/2010   CAD (coronary artery disease) 05/09/2010   Hyperlipidemia 05/09/2010   PCP: Marylynn Verneita CROME, MD  REFERRING PROVIDER: Leora Lynwood JAYSON Mickey., MD  REFERRING DIAG: [phone] (ICD-10-CM) - Presence of left artificial hip joint   THERAPY DIAG:  Stiffness of left hip, not elsewhere classified  Muscle weakness (generalized)  Pain in left hip  Gait difficulty  Decreased range of motion  Rationale for Evaluation and Treatment: Rehabilitation  ONSET DATE: 09/07/2022  SUBJECTIVE:   SUBJECTIVE STATEMENT: Pt reports to PT with c/c of L lateral hip/ piriformis pain with increase walking/ climbing stairs/ household tasks/ sleeping position.  Pt had L hip replaced on 07/12/2022, which subsequently got infected on 08/01/2022. Pt. Worked hard with PT in past and returns today with increase c/o L hip/piriformis pain symptoms.  Pt. Prescribed Celebrex  but  has not started medication. She denies any numbness/tingling or clicking/popping.   PERTINENT HISTORY: Hx of bilateral THA (2017), R TKA (2021), CAD, HTN  PAIN:  Are you having pain? Yes: NPRS scale: 3/10 currently, >7/10 at worst Pain location: L lateral hip Pain description: sore/ tight Aggravating factors: Walking Relieving factors: Rest, wine  PRECAUTIONS: None  RED FLAGS: None   WEIGHT BEARING RESTRICTIONS: No  FALLS:  Has patient fallen in  last 6 months? No falls but several near falls where pt describes losing her balance with forward leaning  LIVING ENVIRONMENT: Lives with: lives with their daughter Lives in: House/apartment Stairs: Yes: External: 3 steps; can reach both Has following equipment at home: [employer] - 2 wheeled  OCCUPATION: Retired  PLOF: Independent with household mobility with device   PATIENT GOALS: Get back to walking without pain, get ready for 1 month [place] cruise in December   NEXT MD VISIT: 3rd week of June  OBJECTIVE:   DIAGNOSTIC FINDINGS: N/A  PATIENT SURVEYS:  LEFS: 29 out of 75  COGNITION: Overall cognitive status: Within functional limits for tasks assessed     SENSATION: WFL  MUSCLE LENGTH: Hamstrings: Right 150 deg; Left 150 deg  POSTURE: No Significant postural limitations  LEG LENGTH: 34 inches bilaterally for true leg length (ASIS to medial malleolus)  LOWER EXTREMITY ROM:  Active ROM Right eval Left eval  Hip flexion 115 110  Hip extension 12 8  Hip abduction 32 21  Hip adduction    Hip internal rotation    Hip external rotation    Knee flexion [facility] WFL  Knee extension [facility] WFL  Ankle dorsiflexion WFL WFL   (Blank rows = not tested)  LOWER EXTREMITY MMT:  MMT Right eval Left eval  Hip flexion 3+ 3   Hip extension 4 3+  Hip abduction 4 3  Hip adduction    Hip internal rotation 3+ 3+  Hip external rotation 4- 3+  Knee flexion 5 5  Knee extension 5 5  Ankle dorsiflexion    Ankle plantarflexion    Ankle inversion    Ankle eversion     (Blank rows = not tested)  LOWER EXTREMITY SPECIAL TESTS:  Hip special tests: [place] (FABER) test: positive on L and (FADIR) test: negative bilaterally  FUNCTIONAL TESTS:  5 times sit to stand: 22.58 seconds (no UE assist).  L hip IR/ foot IV with STS and while sitting in recliner (reported per pt.).    GAIT: Distance walked: in clinic Assistive device utilized: Quad cane small  base Level of assistance: Complete Independence Comments: Antalgic gait, (+) trendelenburg noted on L side.  Increase L hip/ piriformis pain with recip. Stair climbing, esp. Descending.  Pt. Prefers lateral step pattern while descending.    LEFS: 58 out of 80.  Marked improvement  Active ROM Right eval Left eval  Hip flexion 119 123  Hip extension  (standing) 26 27  Hip abduction 28 26 (pain)  Hip adduction    Hip internal rotation    Hip external rotation    Knee flexion [facility]  Knee extension [facility]  Ankle dorsiflexion [facility] WFL   5xSTS: 23 sec.  (No UE assist) Observed stair mechanics: Pt utilized B handrails and a step through patten when ascending and descending. Pt reported L anterior hip pain when descending. No pain reported on ascending  LEFS: 50 out of 80 (marked improvement from initial evaluation but slight regression from last reassessment).  LOWER EXTREMITY MMT:  MMT Right eval Left eval  Hip flexion 3+ 3+  Hip extension    Hip abduction 4+ 4+ (minimal discomfort)  Hip adduction    Hip internal rotation 4 4  Hip external rotation 3+ 3+  Knee flexion 5 5  Knee extension 5 5  Ankle dorsiflexion    Ankle plantarflexion    Ankle inversion    Ankle eversion     (Blank rows = not tested) L Hamstring length: Proximal: 64 deg, Distal: 52 deg  LOWER EXTREMITY MMT:  (11/26/23)  MMT Right eval Left eval  Hip flexion 3+/ 4- 3/4-  Hip extension 4/4 3+/4  Hip abduction 4 3  Hip adduction    Hip internal rotation 3+/4 3+/3+  Hip external rotation 4-/4 3+/3+  Knee flexion 5 5  Knee extension 5 5  Ankle dorsiflexion    Ankle plantarflexion    Ankle inversion    Ankle eversion     (Blank rows = not tested)   BOLD numbers are updated MMT  LEFS: 66 out of 80 (marked increase)  TODAY'S TREATMENT:                                                                                                                              DATE: 12/17/2023  Subjective:  Pt.  Has continued to take Potassium supplement with benefits in muscle cramping.  Pt. States she is still limited with L hip flexor tightness/ discomfort.  PT reviewed pts.  POC discussed with upcoming trip at end of November.  Pt. Ready for trip and will continue with stretching/ take breaks as needed.    There.act.:    Standing 3-way hip ex. At //-bars.  Reviewed HEP/ hip flexor stretches.   Manual tx.:  Supine R/L LE stretches (focus on on L hip flexor- Thomas stretch/ piriformis/ glut stretches)- 15 minutes.    STM to L anterior hip in supine (remains tight/ thick band of muscle as compared to R).    R sidelying L hip/ piriformis STM with use of Hypervolt.     PATIENT EDUCATION:  Education details: Pt educated on HEP form and frequency, role of PT going forward Person educated: Patient Education method: Explanation, Demonstration, and Handouts Education comprehension: verbalized understanding and returned demonstration  HOME EXERCISE PROGRAM: Access Code: [email or web address] URL: [email or web address] Date: 06/05/2023 Prepared by: Ozell Sero  Exercises - Supine Hamstring Stretch  - 2 x daily - 7 x weekly - 1 sets - 3 reps - Supine Figure 4 Piriformis Stretch  - 2 x daily - 7 x weekly - 1 sets - 3 reps - 20 seconds hold - Supine Piriformis Stretch with Leg Straight  - 2 x daily - 7 x weekly - 1 sets - 3 reps - 20 seconds hold - Supine Bridge  - 2 x daily - 4-5 x weekly - 2 sets - 10 reps - Seated Piriformis Stretch  - 2 x daily - 7  x weekly - 1 sets - 3 reps - 20 seconds hold - Sit to Stand  - 2 x daily - 4-5 x weekly - 2 sets - 10 reps  ASSESSMENT:  CLINICAL IMPRESSION: Pt continues to experience tightness of left hip flexor and quadriceps region which was addressed with STM to the regions which provided temporary relief, but palpable tightness still noted at end of tx session. Pt did not experience a significant increase or c/o muscle cramping with any interventions on  this day.  Pt. Will continue with Potassium supplement to address muscle cramping/ discomfort.  Pt. Has cruise trip coming up at end of month and will focus on HEP/ stretching.  Pt. Instructed to contact PT after trip to [place] to discuss symptoms.  Discharge from PT at this time.    OBJECTIVE IMPAIRMENTS: Abnormal gait, difficulty walking, decreased ROM, decreased strength, impaired flexibility, and pain.   ACTIVITY LIMITATIONS: bending, standing, squatting, and locomotion level  PARTICIPATION LIMITATIONS: cleaning, laundry, community activity, and yard work  PERSONAL FACTORS: Age, Time since onset of injury/illness/exacerbation, and 3+ comorbidities: Hx of bilateral THA, hx of R TKA, CAD, HTN are also affecting patient's functional outcome.   REHAB POTENTIAL: Good  CLINICAL DECISION MAKING: Stable/uncomplicated  EVALUATION COMPLEXITY: Low   GOALS:  LONG TERM GOALS: Target date: 12/18/2023  Pt will increase LEFS to >60 in order to demonstrate subjective improvement in pain and ADL performance. Baseline: Initial 29.  6/9: 58.  7/24: 50.  11/11: 66 (goal met) Goal status: Goal met  2.  Pt will increase L hip AROM by at least 10 degrees in all planes to improve function with squatting/bending activities. Baseline: See above for initial ROM measurements   6/26: see above Goal status: Partially met  3.  Pt will improve bilateral hip MMT scores to at least 4/5 in order to show improved hip musculature strength needed for improved walking/activity tolerance. Baseline: See above for initial MMT scores.  6/26:  pain limted MMT Goal status: Partially met  4.  Pt will decrease 5TSTS by at least 3 seconds in order to demonstrate clinically significant improvement in LE strength. Baseline: eval: 22.58 sec,  6/26: 23 sec.  11/18: 18.5 sec.  Goal status: Goal met  5.  Pt will report decrease pain by at least 3 points on NPS scale with walking in order to show improved walking  tolerance. Baseline: >6/10 NPS scale with walking on initial evaluation, 6/26: 3/10 NPS scale when walking on even ground, pt reports is higher when walking on uneven ground Goal status: Partially met  6.  Pt will be able to climb ladder with no hip limitations in order to be able to go on Cruise ship at end of year.   Baseline: unable Goal status: Not met  PLAN:  PT FREQUENCY: 1 visit  PLANNED INTERVENTIONS: Therapeutic exercises, Therapeutic activity, Neuromuscular re-education, Balance training, Self Care, Joint mobilization, Cryotherapy, Moist heat, Manual therapy, and Re-evaluation  PLAN FOR NEXT SESSION: Discharge from PT at this time.   Ozell JAYSON Sero, PT, DPT # [phone] 12/27/2023, 11:02 AM

## 2024-02-11 ENCOUNTER — Other Ambulatory Visit: Payer: Self-pay | Admitting: Internal Medicine

## 2024-02-11 DIAGNOSIS — E034 Atrophy of thyroid (acquired): Secondary | ICD-10-CM
